# Patient Record
Sex: Female | Born: 1950 | Race: Black or African American | Hispanic: No | Marital: Single | Smoking: Never smoker
Health system: Southern US, Community
[De-identification: ages and names within clinical notes are randomized; demographics above are authoritative.]

## PROBLEM LIST (undated history)

## (undated) DIAGNOSIS — S63599A Other specified sprain of unspecified wrist, initial encounter: Secondary | ICD-10-CM

## (undated) DIAGNOSIS — M1990 Unspecified osteoarthritis, unspecified site: Secondary | ICD-10-CM

## (undated) DIAGNOSIS — Z87448 Personal history of other diseases of urinary system: Secondary | ICD-10-CM

## (undated) DIAGNOSIS — I10 Essential (primary) hypertension: Secondary | ICD-10-CM

## (undated) DIAGNOSIS — F32A Depression, unspecified: Secondary | ICD-10-CM

## (undated) DIAGNOSIS — R7303 Prediabetes: Secondary | ICD-10-CM

## (undated) DIAGNOSIS — Z862 Personal history of diseases of the blood and blood-forming organs and certain disorders involving the immune mechanism: Secondary | ICD-10-CM

## (undated) DIAGNOSIS — M5136 Other intervertebral disc degeneration, lumbar region: Secondary | ICD-10-CM

## (undated) DIAGNOSIS — G629 Polyneuropathy, unspecified: Secondary | ICD-10-CM

## (undated) DIAGNOSIS — M7541 Impingement syndrome of right shoulder: Secondary | ICD-10-CM

## (undated) DIAGNOSIS — M5430 Sciatica, unspecified side: Secondary | ICD-10-CM

## (undated) DIAGNOSIS — T8859XA Other complications of anesthesia, initial encounter: Secondary | ICD-10-CM

## (undated) DIAGNOSIS — T7840XA Allergy, unspecified, initial encounter: Secondary | ICD-10-CM

## (undated) DIAGNOSIS — K219 Gastro-esophageal reflux disease without esophagitis: Secondary | ICD-10-CM

## (undated) DIAGNOSIS — E119 Type 2 diabetes mellitus without complications: Secondary | ICD-10-CM

## (undated) DIAGNOSIS — Z87442 Personal history of urinary calculi: Secondary | ICD-10-CM

## (undated) DIAGNOSIS — M25831 Other specified joint disorders, right wrist: Secondary | ICD-10-CM

## (undated) DIAGNOSIS — E669 Obesity, unspecified: Secondary | ICD-10-CM

## (undated) DIAGNOSIS — Z8639 Personal history of other endocrine, nutritional and metabolic disease: Secondary | ICD-10-CM

## (undated) DIAGNOSIS — I890 Lymphedema, not elsewhere classified: Secondary | ICD-10-CM

## (undated) DIAGNOSIS — E785 Hyperlipidemia, unspecified: Secondary | ICD-10-CM

## (undated) DIAGNOSIS — H269 Unspecified cataract: Secondary | ICD-10-CM

## (undated) DIAGNOSIS — T4145XA Adverse effect of unspecified anesthetic, initial encounter: Secondary | ICD-10-CM

## (undated) DIAGNOSIS — Z98811 Dental restoration status: Secondary | ICD-10-CM

## (undated) DIAGNOSIS — Z972 Presence of dental prosthetic device (complete) (partial): Secondary | ICD-10-CM

## (undated) HISTORY — DX: Hyperlipidemia, unspecified: E78.5

## (undated) HISTORY — DX: Depression, unspecified: F32.A

## (undated) HISTORY — PX: PATELLA REALIGNMENT: SHX2179

## (undated) HISTORY — DX: Unspecified cataract: H26.9

## (undated) HISTORY — PX: DILATION AND CURETTAGE OF UTERUS: SHX78

## (undated) HISTORY — DX: Allergy, unspecified, initial encounter: T78.40XA

## (undated) HISTORY — PX: ELBOW ARTHROSCOPY: SHX614

## (undated) HISTORY — PX: TONSILLECTOMY: SUR1361

## (undated) HISTORY — PX: JOINT REPLACEMENT: SHX530

## (undated) HISTORY — PX: SHOULDER ARTHROSCOPY: SHX128

## (undated) HISTORY — PX: ROTATOR CUFF REPAIR: SHX139

## (undated) HISTORY — PX: EYE SURGERY: SHX253

## (undated) HISTORY — DX: Essential (primary) hypertension: I10

---
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
## 1989-05-11 DIAGNOSIS — Z862 Personal history of diseases of the blood and blood-forming organs and certain disorders involving the immune mechanism: Secondary | ICD-10-CM

## 1989-05-11 HISTORY — DX: Personal history of diseases of the blood and blood-forming organs and certain disorders involving the immune mechanism: Z86.2

## 1989-05-11 HISTORY — PX: ABDOMINAL HYSTERECTOMY: SHX81

## 1989-05-11 LAB — CONVERTED CEMR LAB

## 1997-09-06 ENCOUNTER — Ambulatory Visit (HOSPITAL_BASED_OUTPATIENT_CLINIC_OR_DEPARTMENT_OTHER): Admission: RE | Admit: 1997-09-06 | Discharge: 1997-09-06 | Payer: Self-pay | Admitting: Orthopedic Surgery

## 1998-03-14 ENCOUNTER — Emergency Department (HOSPITAL_COMMUNITY): Admission: EM | Admit: 1998-03-14 | Discharge: 1998-03-14 | Payer: Self-pay

## 1998-03-15 ENCOUNTER — Encounter: Payer: Self-pay | Admitting: Urology

## 1998-03-15 ENCOUNTER — Ambulatory Visit (HOSPITAL_COMMUNITY): Admission: RE | Admit: 1998-03-15 | Discharge: 1998-03-15 | Payer: Self-pay | Admitting: Urology

## 1998-04-09 ENCOUNTER — Encounter: Payer: Self-pay | Admitting: Urology

## 1998-04-09 ENCOUNTER — Ambulatory Visit (HOSPITAL_COMMUNITY): Admission: RE | Admit: 1998-04-09 | Discharge: 1998-04-09 | Payer: Self-pay | Admitting: Urology

## 1998-04-25 ENCOUNTER — Ambulatory Visit (HOSPITAL_COMMUNITY): Admission: RE | Admit: 1998-04-25 | Discharge: 1998-04-25 | Payer: Self-pay | Admitting: Urology

## 1998-04-25 ENCOUNTER — Encounter: Payer: Self-pay | Admitting: Urology

## 1998-05-06 ENCOUNTER — Ambulatory Visit (HOSPITAL_COMMUNITY): Admission: RE | Admit: 1998-05-06 | Discharge: 1998-05-06 | Payer: Self-pay | Admitting: Urology

## 1998-05-06 ENCOUNTER — Encounter: Payer: Self-pay | Admitting: Urology

## 1999-08-06 ENCOUNTER — Other Ambulatory Visit: Admission: RE | Admit: 1999-08-06 | Discharge: 1999-08-06 | Payer: Self-pay | Admitting: Physician Assistant

## 2000-05-11 DIAGNOSIS — Z87898 Personal history of other specified conditions: Secondary | ICD-10-CM

## 2000-05-11 DIAGNOSIS — Z87448 Personal history of other diseases of urinary system: Secondary | ICD-10-CM

## 2000-05-11 HISTORY — DX: Personal history of other specified conditions: Z87.898

## 2000-05-11 HISTORY — DX: Personal history of other diseases of urinary system: Z87.448

## 2000-06-16 ENCOUNTER — Ambulatory Visit (HOSPITAL_BASED_OUTPATIENT_CLINIC_OR_DEPARTMENT_OTHER): Admission: RE | Admit: 2000-06-16 | Discharge: 2000-06-16 | Payer: Self-pay | Admitting: *Deleted

## 2000-06-16 HISTORY — PX: WRIST ARTHROSCOPY: SHX838

## 2000-07-07 ENCOUNTER — Encounter: Admission: RE | Admit: 2000-07-07 | Discharge: 2000-07-07 | Payer: Self-pay | Admitting: Urology

## 2000-07-07 ENCOUNTER — Encounter: Payer: Self-pay | Admitting: Urology

## 2000-07-12 ENCOUNTER — Ambulatory Visit (HOSPITAL_COMMUNITY): Admission: RE | Admit: 2000-07-12 | Discharge: 2000-07-12 | Payer: Self-pay | Admitting: Urology

## 2000-09-10 ENCOUNTER — Other Ambulatory Visit: Admission: RE | Admit: 2000-09-10 | Discharge: 2000-09-10 | Payer: Self-pay | Admitting: Internal Medicine

## 2001-02-09 ENCOUNTER — Encounter: Payer: Self-pay | Admitting: Internal Medicine

## 2001-02-18 ENCOUNTER — Encounter: Payer: Self-pay | Admitting: Internal Medicine

## 2001-09-13 ENCOUNTER — Other Ambulatory Visit: Admission: RE | Admit: 2001-09-13 | Discharge: 2001-10-03 | Payer: Self-pay

## 2001-10-13 ENCOUNTER — Encounter: Payer: Self-pay | Admitting: Internal Medicine

## 2002-04-26 ENCOUNTER — Ambulatory Visit (HOSPITAL_BASED_OUTPATIENT_CLINIC_OR_DEPARTMENT_OTHER): Admission: RE | Admit: 2002-04-26 | Discharge: 2002-04-26 | Payer: Self-pay | Admitting: Orthopedic Surgery

## 2002-04-26 HISTORY — PX: SHOULDER ARTHROSCOPY W/ ROTATOR CUFF REPAIR: SHX2400

## 2002-08-09 ENCOUNTER — Ambulatory Visit (HOSPITAL_BASED_OUTPATIENT_CLINIC_OR_DEPARTMENT_OTHER): Admission: RE | Admit: 2002-08-09 | Discharge: 2002-08-09 | Payer: Self-pay | Admitting: *Deleted

## 2002-08-09 HISTORY — PX: CARPAL TUNNEL RELEASE: SHX101

## 2002-09-26 ENCOUNTER — Other Ambulatory Visit: Admission: RE | Admit: 2002-09-26 | Discharge: 2002-09-26 | Payer: Self-pay | Admitting: Internal Medicine

## 2002-09-28 ENCOUNTER — Encounter: Payer: Self-pay | Admitting: Internal Medicine

## 2003-08-28 ENCOUNTER — Encounter: Payer: Self-pay | Admitting: Internal Medicine

## 2003-09-10 ENCOUNTER — Inpatient Hospital Stay (HOSPITAL_COMMUNITY): Admission: RE | Admit: 2003-09-10 | Discharge: 2003-09-13 | Payer: Self-pay | Admitting: Orthopedic Surgery

## 2003-09-10 HISTORY — PX: TOTAL KNEE ARTHROPLASTY: SHX125

## 2004-08-20 ENCOUNTER — Encounter: Payer: Self-pay | Admitting: Internal Medicine

## 2004-09-12 ENCOUNTER — Ambulatory Visit: Payer: Self-pay | Admitting: Internal Medicine

## 2004-09-23 ENCOUNTER — Ambulatory Visit: Payer: Self-pay | Admitting: Internal Medicine

## 2004-11-10 ENCOUNTER — Inpatient Hospital Stay (HOSPITAL_COMMUNITY): Admission: RE | Admit: 2004-11-10 | Discharge: 2004-11-14 | Payer: Self-pay | Admitting: Orthopedic Surgery

## 2004-11-10 HISTORY — PX: TOTAL KNEE ARTHROPLASTY: SHX125

## 2005-09-24 ENCOUNTER — Ambulatory Visit: Payer: Self-pay | Admitting: Family Medicine

## 2005-10-07 ENCOUNTER — Ambulatory Visit: Payer: Self-pay | Admitting: Internal Medicine

## 2005-10-22 ENCOUNTER — Ambulatory Visit (HOSPITAL_COMMUNITY): Admission: RE | Admit: 2005-10-22 | Discharge: 2005-10-22 | Payer: Self-pay | Admitting: Internal Medicine

## 2005-10-23 ENCOUNTER — Encounter: Payer: Self-pay | Admitting: Internal Medicine

## 2005-10-29 ENCOUNTER — Ambulatory Visit: Payer: Self-pay | Admitting: Internal Medicine

## 2005-11-06 ENCOUNTER — Ambulatory Visit: Payer: Self-pay | Admitting: Internal Medicine

## 2006-02-01 ENCOUNTER — Emergency Department (HOSPITAL_COMMUNITY): Admission: EM | Admit: 2006-02-01 | Discharge: 2006-02-01 | Payer: Self-pay | Admitting: Emergency Medicine

## 2006-03-09 ENCOUNTER — Ambulatory Visit: Payer: Self-pay | Admitting: Internal Medicine

## 2006-03-09 DIAGNOSIS — I10 Essential (primary) hypertension: Secondary | ICD-10-CM | POA: Insufficient documentation

## 2006-03-09 DIAGNOSIS — E785 Hyperlipidemia, unspecified: Secondary | ICD-10-CM

## 2006-03-09 DIAGNOSIS — Z87442 Personal history of urinary calculi: Secondary | ICD-10-CM

## 2006-05-06 ENCOUNTER — Ambulatory Visit: Payer: Self-pay | Admitting: Internal Medicine

## 2006-06-07 ENCOUNTER — Ambulatory Visit: Payer: Self-pay | Admitting: Internal Medicine

## 2006-10-05 ENCOUNTER — Ambulatory Visit: Payer: Self-pay | Admitting: Internal Medicine

## 2006-10-05 LAB — CONVERTED CEMR LAB
ALT: 18 units/L (ref 0–40)
AST: 35 units/L (ref 0–37)
Albumin: 3.7 g/dL (ref 3.5–5.2)
Alkaline Phosphatase: 44 units/L (ref 39–117)
BUN: 15 mg/dL (ref 6–23)
Basophils Absolute: 0 10*3/uL (ref 0.0–0.1)
Basophils Relative: 0.4 % (ref 0.0–1.0)
Bilirubin, Direct: 0.1 mg/dL (ref 0.0–0.3)
CO2: 30 meq/L (ref 19–32)
Calcium: 9.9 mg/dL (ref 8.4–10.5)
Chloride: 107 meq/L (ref 96–112)
Cholesterol: 209 mg/dL (ref 0–200)
Creatinine, Ser: 0.8 mg/dL (ref 0.4–1.2)
Direct LDL: 109.7 mg/dL
Eosinophils Absolute: 0 10*3/uL (ref 0.0–0.6)
Eosinophils Relative: 1.2 % (ref 0.0–5.0)
GFR calc Af Amer: 95 mL/min
GFR calc non Af Amer: 79 mL/min
Glucose, Bld: 88 mg/dL (ref 70–99)
HCT: 37.3 % (ref 36.0–46.0)
HDL: 46.6 mg/dL (ref >39.0)
Hemoglobin: 13.2 g/dL (ref 12.0–15.0)
Lymphocytes Relative: 48.1 % — ABNORMAL HIGH (ref 12.0–46.0)
MCHC: 35.4 g/dL (ref 30.0–36.0)
MCV: 88.2 fL (ref 78.0–100.0)
Monocytes Absolute: 0.6 10*3/uL (ref 0.2–0.7)
Monocytes Relative: 14.9 % — ABNORMAL HIGH (ref 3.0–11.0)
Neutro Abs: 1.4 10*3/uL (ref 1.4–7.7)
Neutrophils Relative %: 35.4 % — ABNORMAL LOW (ref 43.0–77.0)
Platelets: 166 10*3/uL (ref 150–400)
Potassium: 3.7 meq/L (ref 3.5–5.1)
RBC: 4.24 M/uL (ref 3.87–5.11)
RDW: 12.3 % (ref 11.5–14.6)
Sodium: 141 meq/L (ref 135–145)
TSH: 0.95 microintl units/mL (ref 0.35–5.50)
Total Bilirubin: 0.7 mg/dL (ref 0.3–1.2)
Total CHOL/HDL Ratio: 4.5
Total Protein: 7 g/dL (ref 6.0–8.3)
Triglycerides: 186 mg/dL — ABNORMAL HIGH (ref 0–149)
VLDL: 37 mg/dL (ref 0–40)
WBC: 3.9 10*3/uL — ABNORMAL LOW (ref 4.5–10.5)

## 2006-10-12 ENCOUNTER — Ambulatory Visit: Payer: Self-pay | Admitting: Internal Medicine

## 2006-11-03 ENCOUNTER — Ambulatory Visit (HOSPITAL_BASED_OUTPATIENT_CLINIC_OR_DEPARTMENT_OTHER): Admission: RE | Admit: 2006-11-03 | Discharge: 2006-11-03 | Payer: Self-pay | Admitting: *Deleted

## 2006-11-03 HISTORY — PX: CARPAL TUNNEL RELEASE: SHX101

## 2007-03-09 ENCOUNTER — Ambulatory Visit: Payer: Self-pay | Admitting: Internal Medicine

## 2007-05-09 ENCOUNTER — Telehealth: Payer: Self-pay | Admitting: Internal Medicine

## 2007-05-17 ENCOUNTER — Telehealth: Payer: Self-pay | Admitting: Internal Medicine

## 2007-05-24 ENCOUNTER — Telehealth: Payer: Self-pay | Admitting: Internal Medicine

## 2007-05-27 ENCOUNTER — Telehealth: Payer: Self-pay | Admitting: Internal Medicine

## 2007-09-22 ENCOUNTER — Encounter: Payer: Self-pay | Admitting: Internal Medicine

## 2007-10-26 ENCOUNTER — Ambulatory Visit: Payer: Self-pay | Admitting: Internal Medicine

## 2007-10-26 LAB — CONVERTED CEMR LAB
ALT: 37 units/L — ABNORMAL HIGH (ref 0–35)
AST: 48 units/L — ABNORMAL HIGH (ref 0–37)
Albumin: 3.7 g/dL (ref 3.5–5.2)
Alkaline Phosphatase: 65 units/L (ref 39–117)
BUN: 13 mg/dL (ref 6–23)
Basophils Absolute: 0 10*3/uL (ref 0.0–0.1)
Basophils Relative: 0.3 % (ref 0.0–1.0)
Bilirubin Urine: NEGATIVE
Bilirubin, Direct: 0.1 mg/dL (ref 0.0–0.3)
CO2: 32 meq/L (ref 19–32)
Calcium: 10.6 mg/dL — ABNORMAL HIGH (ref 8.4–10.5)
Chloride: 104 meq/L (ref 96–112)
Cholesterol: 210 mg/dL (ref 0–200)
Creatinine, Ser: 0.9 mg/dL (ref 0.4–1.2)
Direct LDL: 137.6 mg/dL
Eosinophils Absolute: 0.1 10*3/uL (ref 0.0–0.7)
Eosinophils Relative: 1.7 % (ref 0.0–5.0)
GFR calc Af Amer: 83 mL/min
GFR calc non Af Amer: 68 mL/min
Glucose, Bld: 97 mg/dL (ref 70–99)
Glucose, Urine, Semiquant: NEGATIVE
HCT: 37.5 % (ref 36.0–46.0)
HDL: 41.1 mg/dL (ref >39.0)
Hemoglobin: 13.1 g/dL (ref 12.0–15.0)
Lymphocytes Relative: 51.9 % — ABNORMAL HIGH (ref 12.0–46.0)
MCHC: 35.1 g/dL (ref 30.0–36.0)
MCV: 86.7 fL (ref 78.0–100.0)
Monocytes Absolute: 0.6 10*3/uL (ref 0.1–1.0)
Monocytes Relative: 17.3 % — ABNORMAL HIGH (ref 3.0–12.0)
Neutro Abs: 1 10*3/uL — ABNORMAL LOW (ref 1.4–7.7)
Neutrophils Relative %: 28.8 % — ABNORMAL LOW (ref 43.0–77.0)
Nitrite: NEGATIVE
Platelets: 171 10*3/uL (ref 150–400)
Potassium: 4.4 meq/L (ref 3.5–5.1)
Protein, U semiquant: NEGATIVE
RBC: 4.32 M/uL (ref 3.87–5.11)
RDW: 12.9 % (ref 11.5–14.6)
Sodium: 140 meq/L (ref 135–145)
Specific Gravity, Urine: 1.015
TSH: 0.76 microintl units/mL (ref 0.35–5.50)
Total Bilirubin: 0.7 mg/dL (ref 0.3–1.2)
Total CHOL/HDL Ratio: 5.1
Total Protein: 7.1 g/dL (ref 6.0–8.3)
Triglycerides: 153 mg/dL — ABNORMAL HIGH (ref 0–149)
Urobilinogen, UA: 1
VLDL: 31 mg/dL (ref 0–40)
WBC: 3.4 10*3/uL — ABNORMAL LOW (ref 4.5–10.5)
pH: 5.5

## 2007-11-02 ENCOUNTER — Ambulatory Visit: Payer: Self-pay | Admitting: Internal Medicine

## 2007-11-02 DIAGNOSIS — K5730 Diverticulosis of large intestine without perforation or abscess without bleeding: Secondary | ICD-10-CM | POA: Insufficient documentation

## 2008-01-23 ENCOUNTER — Ambulatory Visit: Payer: Self-pay | Admitting: Internal Medicine

## 2008-07-23 ENCOUNTER — Ambulatory Visit: Payer: Self-pay | Admitting: Internal Medicine

## 2008-07-24 LAB — CONVERTED CEMR LAB
ALT: 17 units/L (ref 0–35)
Bilirubin, Direct: 0.1 mg/dL (ref 0.0–0.3)
CO2: 30 meq/L (ref 19–32)
Calcium: 9.9 mg/dL (ref 8.4–10.5)
Creatinine, Ser: 0.7 mg/dL (ref 0.4–1.2)
GFR calc Af Amer: 110 mL/min
GFR calc non Af Amer: 91 mL/min
HDL: 48.5 mg/dL (ref >39.0)
LDL Cholesterol: 102 mg/dL — ABNORMAL HIGH (ref 0–99)
Sodium: 142 meq/L (ref 135–145)
Total Bilirubin: 0.9 mg/dL (ref 0.3–1.2)
Total CHOL/HDL Ratio: 3.8
Triglycerides: 178 mg/dL — ABNORMAL HIGH (ref 0–149)

## 2008-07-25 ENCOUNTER — Ambulatory Visit: Payer: Self-pay

## 2008-07-25 ENCOUNTER — Encounter: Payer: Self-pay | Admitting: Internal Medicine

## 2008-07-27 ENCOUNTER — Encounter: Payer: Self-pay | Admitting: Internal Medicine

## 2008-08-02 ENCOUNTER — Encounter: Payer: Self-pay | Admitting: Internal Medicine

## 2008-08-06 ENCOUNTER — Ambulatory Visit (HOSPITAL_BASED_OUTPATIENT_CLINIC_OR_DEPARTMENT_OTHER): Admission: RE | Admit: 2008-08-06 | Discharge: 2008-08-06 | Payer: Self-pay | Admitting: *Deleted

## 2008-08-06 HISTORY — PX: TRIGGER FINGER RELEASE: SHX641

## 2008-08-09 ENCOUNTER — Encounter: Payer: Self-pay | Admitting: Internal Medicine

## 2008-09-26 ENCOUNTER — Encounter: Payer: Self-pay | Admitting: Internal Medicine

## 2008-10-02 ENCOUNTER — Encounter (INDEPENDENT_AMBULATORY_CARE_PROVIDER_SITE_OTHER): Payer: Self-pay | Admitting: *Deleted

## 2008-10-31 ENCOUNTER — Ambulatory Visit: Payer: Self-pay | Admitting: Internal Medicine

## 2008-11-01 LAB — CONVERTED CEMR LAB
ALT: 20 units/L (ref 0–35)
Alkaline Phosphatase: 58 units/L (ref 39–117)
BUN: 13 mg/dL (ref 6–23)
Basophils Relative: 0.1 % (ref 0.0–3.0)
Bilirubin, Direct: 0.2 mg/dL (ref 0.0–0.3)
CO2: 30 meq/L (ref 19–32)
Chloride: 104 meq/L (ref 96–112)
Eosinophils Absolute: 0.1 10*3/uL (ref 0.0–0.7)
Eosinophils Relative: 2.4 % (ref 0.0–5.0)
GFR calc non Af Amer: 109.94 mL/min (ref >60)
Glucose, Bld: 95 mg/dL (ref 70–99)
HDL: 48 mg/dL (ref >39.00)
LDL Cholesterol: 84 mg/dL (ref 0–99)
Lymphocytes Relative: 53.2 % — ABNORMAL HIGH (ref 12.0–46.0)
MCV: 89 fL (ref 78.0–100.0)
Monocytes Absolute: 0.5 10*3/uL (ref 0.1–1.0)
Neutrophils Relative %: 27.5 % — ABNORMAL LOW (ref 43.0–77.0)
Nitrite: NEGATIVE
Platelets: 149 10*3/uL — ABNORMAL LOW (ref 150.0–400.0)
Potassium: 4.2 meq/L (ref 3.5–5.1)
RBC: 4.19 M/uL (ref 3.87–5.11)
Sodium: 139 meq/L (ref 135–145)
Total Bilirubin: 0.9 mg/dL (ref 0.3–1.2)
Total Protein, Urine: NEGATIVE mg/dL
Urine Glucose: NEGATIVE mg/dL
VLDL: 27.8 mg/dL (ref 0.0–40.0)
WBC: 3 10*3/uL — ABNORMAL LOW (ref 4.5–10.5)
pH: 8 (ref 5.0–8.0)

## 2008-11-07 ENCOUNTER — Ambulatory Visit: Payer: Self-pay | Admitting: Internal Medicine

## 2008-11-07 DIAGNOSIS — D7289 Other specified disorders of white blood cells: Secondary | ICD-10-CM | POA: Insufficient documentation

## 2008-11-07 LAB — HM COLONOSCOPY

## 2008-11-07 LAB — CONVERTED CEMR LAB
Basophils Relative: 0.5 % (ref 0.0–3.0)
Eosinophils Relative: 1.6 % (ref 0.0–5.0)
HCT: 39.2 % (ref 36.0–46.0)
Hemoglobin: 13.7 g/dL (ref 12.0–15.0)
Lymphs Abs: 1.8 10*3/uL (ref 0.7–4.0)
MCV: 88.1 fL (ref 78.0–100.0)
Monocytes Relative: 13.3 % — ABNORMAL HIGH (ref 3.0–12.0)
Neutro Abs: 1.1 10*3/uL — ABNORMAL LOW (ref 1.4–7.7)
WBC: 3.5 10*3/uL — ABNORMAL LOW (ref 4.5–10.5)

## 2008-11-20 ENCOUNTER — Encounter: Payer: Self-pay | Admitting: Internal Medicine

## 2009-02-21 ENCOUNTER — Encounter: Payer: Self-pay | Admitting: Internal Medicine

## 2009-04-29 ENCOUNTER — Ambulatory Visit: Payer: Self-pay | Admitting: Internal Medicine

## 2009-04-29 DIAGNOSIS — G619 Inflammatory polyneuropathy, unspecified: Secondary | ICD-10-CM

## 2009-04-29 DIAGNOSIS — G622 Polyneuropathy due to other toxic agents: Secondary | ICD-10-CM

## 2009-05-07 LAB — CONVERTED CEMR LAB
ALT: 24 units/L (ref 0–35)
Eosinophils Absolute: 0.1 10*3/uL (ref 0.0–0.7)
Eosinophils Relative: 1.5 % (ref 0.0–5.0)
GFR calc non Af Amer: 94.08 mL/min (ref >60)
Glucose, Bld: 111 mg/dL — ABNORMAL HIGH (ref 70–99)
HCT: 40.9 % (ref 36.0–46.0)
Lymphs Abs: 2 10*3/uL (ref 0.7–4.0)
MCHC: 34.4 g/dL (ref 30.0–36.0)
MCV: 93.9 fL (ref 78.0–100.0)
Monocytes Absolute: 0.5 10*3/uL (ref 0.1–1.0)
Platelets: 173 10*3/uL (ref 150.0–400.0)
Potassium: 4.1 meq/L (ref 3.5–5.1)
RDW: 12.7 % (ref 11.5–14.6)
Sodium: 137 meq/L (ref 135–145)
Total Bilirubin: 0.7 mg/dL (ref 0.3–1.2)

## 2009-06-17 ENCOUNTER — Ambulatory Visit: Payer: Self-pay | Admitting: Internal Medicine

## 2009-06-17 LAB — CONVERTED CEMR LAB
CO2: 30 meq/L (ref 19–32)
Chloride: 105 meq/L (ref 96–112)
Creatinine, Ser: 0.7 mg/dL (ref 0.4–1.2)
Sodium: 138 meq/L (ref 135–145)

## 2009-06-19 LAB — CONVERTED CEMR LAB: Calcium, Total (PTH): 9.8 mg/dL (ref 8.4–10.5)

## 2009-06-24 ENCOUNTER — Encounter: Payer: Self-pay | Admitting: Internal Medicine

## 2009-10-01 ENCOUNTER — Encounter: Payer: Self-pay | Admitting: Internal Medicine

## 2009-11-06 ENCOUNTER — Ambulatory Visit: Payer: Self-pay | Admitting: Internal Medicine

## 2009-11-06 LAB — CONVERTED CEMR LAB
AST: 40 units/L — ABNORMAL HIGH (ref 0–37)
Alkaline Phosphatase: 58 units/L (ref 39–117)
BUN: 13 mg/dL (ref 6–23)
Basophils Absolute: 0 10*3/uL (ref 0.0–0.1)
Blood in Urine, dipstick: NEGATIVE
Calcium: 10.3 mg/dL (ref 8.4–10.5)
Cholesterol: 175 mg/dL (ref 0–200)
Eosinophils Absolute: 0.1 10*3/uL (ref 0.0–0.7)
GFR calc non Af Amer: 106.06 mL/min (ref >60)
Glucose, Bld: 81 mg/dL (ref 70–99)
Glucose, Urine, Semiquant: NEGATIVE
HDL: 50.1 mg/dL (ref >39.00)
Ketones, urine, test strip: NEGATIVE
Lymphocytes Relative: 52.2 % — ABNORMAL HIGH (ref 12.0–46.0)
Lymphs Abs: 2 10*3/uL (ref 0.7–4.0)
Monocytes Relative: 13.3 % — ABNORMAL HIGH (ref 3.0–12.0)
Platelets: 162 10*3/uL (ref 150.0–400.0)
RDW: 13.4 % (ref 11.5–14.6)
Specific Gravity, Urine: 1.015
TSH: 1.15 microintl units/mL (ref 0.35–5.50)
Total Bilirubin: 0.6 mg/dL (ref 0.3–1.2)
Triglycerides: 261 mg/dL — ABNORMAL HIGH (ref 0.0–149.0)
pH: 7

## 2009-11-13 ENCOUNTER — Ambulatory Visit: Payer: Self-pay | Admitting: Internal Medicine

## 2009-12-19 ENCOUNTER — Encounter: Payer: Self-pay | Admitting: Internal Medicine

## 2010-05-11 DIAGNOSIS — M5136 Other intervertebral disc degeneration, lumbar region: Secondary | ICD-10-CM

## 2010-05-11 DIAGNOSIS — M51369 Other intervertebral disc degeneration, lumbar region without mention of lumbar back pain or lower extremity pain: Secondary | ICD-10-CM

## 2010-05-11 HISTORY — DX: Other intervertebral disc degeneration, lumbar region: M51.36

## 2010-05-11 HISTORY — DX: Other intervertebral disc degeneration, lumbar region without mention of lumbar back pain or lower extremity pain: M51.369

## 2010-05-14 ENCOUNTER — Ambulatory Visit
Admission: RE | Admit: 2010-05-14 | Discharge: 2010-05-14 | Payer: Self-pay | Source: Home / Self Care | Attending: Internal Medicine | Admitting: Internal Medicine

## 2010-05-14 ENCOUNTER — Other Ambulatory Visit: Payer: Self-pay | Admitting: Internal Medicine

## 2010-05-14 DIAGNOSIS — M549 Dorsalgia, unspecified: Secondary | ICD-10-CM | POA: Insufficient documentation

## 2010-05-14 LAB — CBC WITH DIFFERENTIAL/PLATELET
Basophils Absolute: 0 10*3/uL (ref 0.0–0.1)
Basophils Relative: 0.4 % (ref 0.0–3.0)
Eosinophils Absolute: 0.1 10*3/uL (ref 0.0–0.7)
Eosinophils Relative: 2.6 % (ref 0.0–5.0)
HCT: 38.3 % (ref 36.0–46.0)
Hemoglobin: 13.3 g/dL (ref 12.0–15.0)
Lymphocytes Relative: 51.6 % — ABNORMAL HIGH (ref 12.0–46.0)
Lymphs Abs: 2.2 10*3/uL (ref 0.7–4.0)
MCHC: 34.6 g/dL (ref 30.0–36.0)
MCV: 91 fl (ref 78.0–100.0)
Monocytes Absolute: 0.5 10*3/uL (ref 0.1–1.0)
Monocytes Relative: 12.9 % — ABNORMAL HIGH (ref 3.0–12.0)
Neutro Abs: 1.4 10*3/uL (ref 1.4–7.7)
Neutrophils Relative %: 32.5 % — ABNORMAL LOW (ref 43.0–77.0)
Platelets: 167 10*3/uL (ref 150.0–400.0)
RBC: 4.21 Mil/uL (ref 3.87–5.11)
RDW: 13.7 % (ref 11.5–14.6)
WBC: 4.2 10*3/uL — ABNORMAL LOW (ref 4.5–10.5)

## 2010-05-14 LAB — TSH: TSH: 0.78 u[IU]/mL (ref 0.35–5.50)

## 2010-05-14 LAB — BASIC METABOLIC PANEL
BUN: 10 mg/dL (ref 6–23)
CO2: 28 mEq/L (ref 19–32)
Calcium: 10.4 mg/dL (ref 8.4–10.5)
Chloride: 103 mEq/L (ref 96–112)
Creatinine, Ser: 0.7 mg/dL (ref 0.4–1.2)
GFR: 109.37 mL/min (ref >60.00)
Glucose, Bld: 95 mg/dL (ref 70–99)
Potassium: 4.4 mEq/L (ref 3.5–5.1)
Sodium: 137 mEq/L (ref 135–145)

## 2010-05-14 LAB — SEDIMENTATION RATE: Sed Rate: 9 mm/hr (ref 0–22)

## 2010-05-14 LAB — LIPID PANEL
Cholesterol: 176 mg/dL (ref 0–200)
HDL: 46.1 mg/dL (ref >39.00)
LDL Cholesterol: 103 mg/dL — ABNORMAL HIGH (ref 0–99)
Total CHOL/HDL Ratio: 4
Triglycerides: 135 mg/dL (ref 0.0–149.0)
VLDL: 27 mg/dL (ref 0.0–40.0)

## 2010-05-28 ENCOUNTER — Encounter
Admission: RE | Admit: 2010-05-28 | Discharge: 2010-06-10 | Payer: Self-pay | Source: Home / Self Care | Attending: Internal Medicine | Admitting: Internal Medicine

## 2010-06-04 ENCOUNTER — Encounter: Payer: Self-pay | Admitting: Internal Medicine

## 2010-06-10 NOTE — Assessment & Plan Note (Signed)
Summary: CPX // RS   Vital Signs:  Patient profile:   60 year old female Menstrual status:  postmenopausal Height:      64.5 inches Weight:      219 pounds BMI:     37.14 Pulse rate:   80 / minute Pulse rhythm:   regular Resp:     12 per minute BP sitting:   104 / 78  (left arm) Cuff size:   regular  Vitals Entered By: Gladis Riffle, RN (November 13, 2009 9:25 AM)  Nutrition Counseling: Patient's BMI is greater than 25 and therefore counseled on weight management options. CC: cpx, labs done Is Patient Diabetic? No   CC:  cpx and labs done.  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 1/2  By Mouth Daily 2)  Premarin 0.625 Mg Tabs (Estrogens Conjugated) .... One By Mouth Daily 3)  Restasis 0.05 % Emul (Cyclosporine) .... Bid 4)  Gabapentin 300 Mg Caps (Gabapentin) .... Take 1 Tablet By Mouth Three Times A Day 5)  Nortriptyline Hcl 10 Mg Caps (Nortriptyline Hcl) .... Two At Bedtime 6)  Tramadol Hcl 50 Mg Tabs (Tramadol Hcl) .... One To Two Every Six Hours As Needed 7)  Multivitamins  Tabs (Multiple Vitamin) .... Once Daily 8)  Ibuprofen 200 Mg Tabs (Ibuprofen) .... As Needed  Allergies: 1)  ! Erythromycin  Past History:  Past Medical History: Last updated: 11/02/2007 Hyperlipidemia Nephrolithiasis, hx of osteoarthritis Hypertension Diverticulosis, colon elevated lfts--hep B and C negative 2006  Past Surgical History: Last updated: 11/07/2008 Carpal tunnel release Hysterectomy Total knee  replacement-bilatera rotator cuff trigger thumb kidney stone extraction right trigger finger (2010)  Family History: Last updated: 11/07/2008 mother-strokes/brain surgery (radiation) father deceased---unknown son deceased---liver transplant (adopted son)  Social History: Last updated: 11/13/2009 Alcohol use-no Regular exercise-no Never Smoked retired DEC 2010  Risk Factors: Exercise: no (11/02/2007)  Risk Factors: Smoking Status: never (11/13/2009)  Social History: Alcohol use-no Regular exercise-no Never Smoked retired DEC 2010  Physical Exam  General:  Well-developed,well-nourished,in no acute distress; alert,appropriate and cooperative throughout examination Head:  normocephalic and atraumatic.   Eyes:  pupils equal and pupils round.   Ears:  R ear normal and L ear normal.   Nose:  External nasal examination shows no deformity or inflammation. Nasal mucosa are pink and moist without lesions or exudates. Neck:  No deformities, masses, or tenderness noted. Chest Wall:  No deformities, masses, or tenderness noted. Lungs:  Normal respiratory effort, chest expands symmetrically. Lungs are clear to auscultation, no crackles or wheezes. Heart:  Normal rate and regular rhythm. S1 and S2 normal without gallop, murmur, click, rub or other extra sounds. Abdomen:  Bowel sounds positive,abdomen soft and non-tender without masses, organomegaly or hernias noted.  overweght Msk:  normal ROM and no joint tenderness.   Pulses:  R radial normal and L radial normal.   Neurologic:  alert & oriented X3 and cranial nerves II-XII intact.   Skin:  turgor normal and color normal.   Psych:  normally interactive and good eye contact.     Impression & Recommendations:  Problem #  1:  PHYSICAL EXAMINATION (ICD-V70.0) health maint UTD advised daily exercise  need for aggressive weight loss  Problem # 2:  HYPERTENSION (ICD-401.9)  controlled continue current medications  Her updated  medication list for this problem includes:    Lisinopril-hydrochlorothiazide 20-25 Mg Tabs (Lisinopril-hydrochlorothiazide) .Marland Kitchen... 1/2  by mouth daily  BP today: 104/78 Prior BP: 118/92 (04/29/2009)  Labs Reviewed: K+: 4.2 (11/06/2009) Creat: : 0.7 (11/06/2009)   Chol: 175 (11/06/2009)   HDL: 50.10 (11/06/2009)   LDL: 84 (10/31/2008)   TG: 261.0 (11/06/2009)  Problem # 3:  HYPERLIPIDEMIA (ICD-272.4) well controlled and on no meds Labs Reviewed: SGOT: 40 (11/06/2009)   SGPT: 21 (11/06/2009)   HDL:50.10 (11/06/2009), 48.00 (10/31/2008)  LDL:84 (10/31/2008), 102 (57/84/6962)  Chol:175 (11/06/2009), 160 (10/31/2008)  Trig:261.0 (11/06/2009), 139.0 (10/31/2008)  Complete Medication List: 1)  Lisinopril-hydrochlorothiazide 20-25 Mg Tabs (Lisinopril-hydrochlorothiazide) .... 1/2  by mouth daily 2)  Premarin 0.45 Mg Tabs (Estrogens conjugated) .... Take 1 tablet by mouth once a day 3)  Restasis 0.05 % Emul (Cyclosporine) .... Bid 4)  Gabapentin 300 Mg Caps (Gabapentin) .... Take 1 tablet by mouth three times a day 5)  Nortriptyline Hcl 10 Mg Caps (Nortriptyline hcl) .... Two at bedtime 6)  Tramadol Hcl 50 Mg Tabs (Tramadol hcl) .... One to two every six hours as needed 7)  Multivitamins Tabs (Multiple vitamin) .... Once daily 8)  Ibuprofen 200 Mg Tabs (Ibuprofen) .... As needed  Preventive Care Screening  Mammogram:    Date:  10/01/2009    Next Due:  10/2010    Results:  normal    Contraindications/Deferment of Procedures/Staging:    Test/Procedure: PAP Smear    Reason for deferment: hysterectomy   Patient Instructions: 1)  Please schedule a follow-up appointment in 6 months. Prescriptions: PREMARIN 0.45 MG TABS (ESTROGENS CONJUGATED) Take 1 tablet by mouth once a day  #90 x 3   Entered and Authorized by:   Birdie Sons MD   Signed by:   Birdie Sons MD on 11/13/2009   Method used:   Faxed to ...       [facility] (mail-order)             , [place]         Ph: [phone]       Fax:  [phone]   RxID:   [id number]    Preventive Care Screening  Mammogram:    Date:  10/01/2009    Next Due:  10/2010    Results:  normal

## 2010-06-10 NOTE — Letter (Signed)
Summary: [facility]  [facility]   Imported By: Maryln Gottron 01/08/2010 15:52:30  _____________________________________________________________________  External Attachment:    Type:   Image     Comment:   External Document

## 2010-06-10 NOTE — Letter (Signed)
Summary: [facility]  [facility]   Imported By: Maryln Gottron 07/01/2009 14:19:57  _____________________________________________________________________  External Attachment:    Type:   Image     Comment:   External Document

## 2010-06-12 ENCOUNTER — Ambulatory Visit: Payer: 59

## 2010-06-12 NOTE — Assessment & Plan Note (Signed)
Summary: 6 month rov/njr   Vital Signs:  Patient profile:   60 year old female Menstrual status:  postmenopausal Weight:      226 pounds Temp:     98.5 degrees F oral Pulse rate:   102 / minute Pulse rhythm:   regular BP sitting:   112 / 80  (left arm) Cuff size:   large  Vitals Entered By: Alfred Levins, CMA (May 14, 2010 8:32 AM) CC: bp check, back pain x1 mth   CC:  bp check and back pain x1 mth.  History of Present Illness:  Follow-Up Visit      This is a 60 year old woman who presents for Follow-up visit.  The patient complains of palpitations, but denies chest pain.  Since the last visit the patient notes no new problems or concerns but has wosening peripheral neuropathy---started lyrica (prescirbed by neurology). Also has had significant right sided lower back and flank pain for at least one month. no radiation of pain.  The patient reports taking meds as prescribed.  When questioned about possible medication side effects, the patient notes none.    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 1/2  By Mouth Daily 2)  Premarin 0.45 Mg Tabs (Estrogens Conjugated) .... Take 1 Tablet By Mouth Once A Day 3)  Restasis 0.05 % Emul (Cyclosporine) .... Bid 4)  Gabapentin 300 Mg Caps (Gabapentin) .... Take 1 Tablet By Mouth Three Times A Day 5)  Nortriptyline Hcl 10 Mg Caps (Nortriptyline Hcl) .... Two At Bedtime 6)  Tramadol Hcl 50 Mg Tabs (Tramadol Hcl) .... One To Two Every Six Hours As Needed 7)  Multivitamins  Tabs  (Multiple Vitamin) .... Once Daily 8)  Ibuprofen 200 Mg Tabs (Ibuprofen) .... As Needed 9)  Vitamin B-12 1000 Mcg Tabs (Cyanocobalamin) .Marland Kitchen.. 1 By Mouth Once Daily 10)  Vitamin B-6 100 Mg Tabs (Pyridoxine Hcl) .Marland Kitchen.. 1 By Mouth Once Daily 11)  Lyrica 50 Mg Caps (Pregabalin) .Marland Kitchen.. 1 By Mouth Two Times A Day  Allergies (verified): 1)  ! Erythromycin  Past History:  Past Medical History: Last updated: 11/02/2007 Hyperlipidemia Nephrolithiasis, hx of osteoarthritis Hypertension Diverticulosis, colon elevated lfts--hep B and C negative 2006  Past Surgical History: Last updated: 11/07/2008 Carpal tunnel release Hysterectomy Total knee replacement-bilatera rotator cuff trigger thumb kidney stone extraction right trigger finger (2010)  Family History: Last updated: 11/07/2008 mother-strokes/brain surgery (radiation) father deceased---unknown son deceased---liver transplant (adopted son)  Social History: Last updated: 11/13/2009 Alcohol use-no Regular exercise-no Never Smoked retired DEC 2010  Risk Factors: Exercise: no (11/02/2007)  Risk Factors: Smoking Status: never (11/13/2009)  Physical Exam  General:   obese female in no acute distress HEENT exam atraumatic, normocephalic symmetric her muscles are intact. Neck is supple. Chest good auscultation cardiac exam S1-S2 are regular. Abdominal exam obese, good bowel sounds. Extremities no clubbing cyanosis or edema. No back pain to palpation of the LS-spine. Deep tendon reflexes are diminished bilaterally at the knees. Deep tendon reflexes are absent bilaterally at the ankles. She has full range of motion of both hips. Strength is normal in the lower  extremities. Gait is normal.   Impression & Recommendations:  Problem # 1:  HYPERTENSION (ICD-401.9)  tolerating meds without difficulty Her updated medication list for this problem includes:    Lisinopril-hydrochlorothiazide 20-25 Mg Tabs (Lisinopril-hydrochlorothiazide)  .Marland Kitchen... 1/2  by mouth daily  BP today: 112/80 Prior BP: 104/78 (11/13/2009)  Labs Reviewed: K+: 4.2 (11/06/2009) Creat: : 0.7 (11/06/2009)   Chol: 175 (11/06/2009)   HDL: 50.10 (11/06/2009)   LDL: 84 (10/31/2008)   TG: 261.0 (11/06/2009)  Orders: Venipuncture (52841) TLB-BMP (Basic Metabolic Panel-BMET) (80048-METABOL)  Problem # 2:  NEPHROLITHIASIS, HX OF (ICD-V13.01) no recurrence  Problem # 3:  HYPERLIPIDEMIA (ICD-272.4)  no meds (this really has been a TG problem) Labs Reviewed: SGOT: 40 (11/06/2009)   SGPT: 21 (11/06/2009)   HDL:50.10 (11/06/2009), 48.00 (10/31/2008)  LDL:84 (10/31/2008), 102 (32/44/0102)  Chol:175 (11/06/2009), 160 (10/31/2008)  Trig:261.0 (11/06/2009), 139.0 (10/31/2008)  Orders: TLB-Lipid Panel (80061-LIPID) TLB-TSH (Thyroid Stimulating Hormone) (84443-TSH)  Problem # 4:  NEUROPATHY, NOS (ICD-357.9) note combination of neurontin and lyrica---she states she is supposed to be taking both---she will confirm with neurology  Problem # 5:  BACK PAIN (ICD-724.5)  ? cuase suspect mechanical LBP advised exercise and PT but will check xray first  Back pain is now constant---worse late evening and early a.m. Her updated medication list for this problem includes:    Tramadol Hcl 50 Mg Tabs (Tramadol hcl) ..... One to two every six hours as needed    Ibuprofen 200 Mg Tabs (Ibuprofen) .Marland Kitchen... As needed  Orders: T-Lumbar Spine 2 Views (72100TC) TLB-Sedimentation Rate (ESR) (85652-ESR)  Complete Medication List: 1)  Lisinopril-hydrochlorothiazide 20-25 Mg Tabs (Lisinopril-hydrochlorothiazide) .... 1/2  by mouth daily 2)  Premarin 0.45 Mg Tabs (Estrogens conjugated) .... Take 1 tablet by mouth once a day 3)  Restasis 0.05 % Emul (Cyclosporine) .... Bid 4)  Gabapentin 300 Mg Caps (Gabapentin) .... Take 1 tablet by mouth three times a day 5)  Nortriptyline Hcl 10 Mg Caps (Nortriptyline hcl) .... Two at bedtime 6)  Tramadol Hcl 50 Mg Tabs (Tramadol hcl) .... One  to two every six hours as needed 7)  Multivitamins Tabs (Multiple vitamin) .... Once daily 8)  Ibuprofen 200 Mg Tabs (Ibuprofen) .... As needed 9)  Vitamin B-12 1000 Mcg Tabs (Cyanocobalamin) .Marland Kitchen.. 1 by mouth once daily 10)  Vitamin B-6 100 Mg Tabs (Pyridoxine hcl) .Marland Kitchen.. 1 by mouth once daily 11)  Lyrica 50 Mg Caps (Pregabalin) .Marland Kitchen.. 1 by mouth two times a day  Other Orders: TLB-CBC Platelet - w/Differential (85025-CBCD)   Orders Added: 1)  T-Lumbar Spine 2 Views [72100TC] 2)  Est. Patient Level IV [72536] 3)  Venipuncture [36415] 4)  TLB-BMP (Basic Metabolic Panel-BMET) [80048-METABOL] 5)  TLB-CBC Platelet - w/Differential [85025-CBCD] 6)  TLB-Lipid Panel [80061-LIPID] 7)  TLB-TSH (Thyroid Stimulating Hormone) [84443-TSH] 8)  TLB-Sedimentation Rate (ESR) [85652-ESR]  Appended Document: Orders Update     Clinical Lists Changes  Orders: Added new Service order of Specimen Handling (64403) - Signed

## 2010-06-18 NOTE — Miscellaneous (Signed)
Summary: Initial Summary for PT Services/[facility]  Initial Summary for PT Services/[facility]   Imported By: Maryln Gottron 06/10/2010 14:24:04  _____________________________________________________________________  External Attachment:    Type:   Image     Comment:   External Document

## 2010-06-25 ENCOUNTER — Ambulatory Visit: Payer: 59 | Attending: Internal Medicine

## 2010-06-25 DIAGNOSIS — M545 Low back pain, unspecified: Secondary | ICD-10-CM | POA: Insufficient documentation

## 2010-06-25 DIAGNOSIS — R293 Abnormal posture: Secondary | ICD-10-CM | POA: Insufficient documentation

## 2010-06-25 DIAGNOSIS — IMO0001 Reserved for inherently not codable concepts without codable children: Secondary | ICD-10-CM | POA: Insufficient documentation

## 2010-06-27 ENCOUNTER — Ambulatory Visit: Payer: 59 | Admitting: Physical Therapy

## 2010-07-01 ENCOUNTER — Ambulatory Visit: Payer: 59

## 2010-07-03 ENCOUNTER — Ambulatory Visit: Payer: 59

## 2010-07-08 ENCOUNTER — Ambulatory Visit: Payer: 59 | Admitting: Physical Therapy

## 2010-07-09 ENCOUNTER — Ambulatory Visit: Payer: 59 | Admitting: Physical Therapy

## 2010-07-10 ENCOUNTER — Ambulatory Visit: Payer: 59 | Attending: Internal Medicine

## 2010-07-10 DIAGNOSIS — IMO0001 Reserved for inherently not codable concepts without codable children: Secondary | ICD-10-CM | POA: Insufficient documentation

## 2010-07-10 DIAGNOSIS — M545 Low back pain, unspecified: Secondary | ICD-10-CM | POA: Insufficient documentation

## 2010-07-10 DIAGNOSIS — R293 Abnormal posture: Secondary | ICD-10-CM | POA: Insufficient documentation

## 2010-08-21 LAB — BASIC METABOLIC PANEL
BUN: 10 mg/dL (ref 6–23)
Chloride: 102 mEq/L (ref 96–112)
Creatinine, Ser: 0.68 mg/dL (ref 0.4–1.2)
GFR calc non Af Amer: >60 mL/min (ref >60)
Glucose, Bld: 91 mg/dL (ref 70–99)
Potassium: 4.1 mEq/L (ref 3.5–5.1)

## 2010-08-26 ENCOUNTER — Other Ambulatory Visit: Payer: Self-pay | Admitting: *Deleted

## 2010-08-26 NOTE — Telephone Encounter (Signed)
Wants "generic" Premarin sent to [facility], please.

## 2010-08-26 NOTE — Telephone Encounter (Signed)
Pt called back and prefers Katherine [name] [facility])

## 2010-08-27 MED ORDER — ESTROGENS CONJUGATED 0.625 MG PO TABS: 0.6250 mg | ORAL_TABLET | Freq: Every day | ORAL | Status: DC

## 2010-08-27 NOTE — Telephone Encounter (Signed)
Ok- i'm not sure there is such a thing

## 2010-09-08 ENCOUNTER — Ambulatory Visit (INDEPENDENT_AMBULATORY_CARE_PROVIDER_SITE_OTHER): Payer: 59 | Admitting: Internal Medicine

## 2010-09-08 ENCOUNTER — Encounter: Payer: Self-pay | Admitting: Internal Medicine

## 2010-09-08 DIAGNOSIS — E785 Hyperlipidemia, unspecified: Secondary | ICD-10-CM

## 2010-09-08 DIAGNOSIS — I10 Essential (primary) hypertension: Secondary | ICD-10-CM

## 2010-09-08 DIAGNOSIS — G622 Polyneuropathy due to other toxic agents: Secondary | ICD-10-CM

## 2010-09-08 DIAGNOSIS — G619 Inflammatory polyneuropathy, unspecified: Secondary | ICD-10-CM

## 2010-09-08 MED ORDER — LISINOPRIL 10 MG PO TABS: 10.0000 mg | ORAL_TABLET | Freq: Every day | ORAL | Status: DC

## 2010-09-08 MED ORDER — HYDROCHLOROTHIAZIDE 25 MG PO TABS: 25.0000 mg | ORAL_TABLET | Freq: Every day | ORAL | Status: DC

## 2010-09-08 NOTE — Progress Notes (Signed)
  Subjective:    Patient ID: Katherine [name], female    DOB: [birth year], 60 y.o.   MRN: [id number]  HPI  Peripheral neuropathy---being seen by neurology: carol martin  htn--tolerating meds  She describes swelling of hands, feet and knees. She thinks related to medications  Chronic pain  Past Medical History  Diagnosis Date  . Hyperlipidemia   . Chronic kidney disease     kidney stones  . Arthritis   . Hypertension   . Diverticulosis of colon   . Elevated LFTs     neg for hep b and c in 2006   Past Surgical History  Procedure Date  . Carpal tunnel release   . Abdominal hysterectomy   . Replacement total knee bilateral   . Rotator cuff repair   . Kidney stone surgery   . Trigger finger release 2010    right    reports that she has never smoked. She does not have any smokeless tobacco history on file. She reports that she does not drink alcohol. Her drug history not on file. family history includes Stroke in her mother. Allergies  Allergen Reactions  . Erythromycin     REACTION: chest pain     Review of Systems     Objective:   Physical Exam        Assessment & Plan:

## 2010-09-15 ENCOUNTER — Encounter: Payer: Self-pay | Admitting: Internal Medicine

## 2010-09-15 NOTE — Assessment & Plan Note (Signed)
Reviewed labs No need for meds

## 2010-09-15 NOTE — Assessment & Plan Note (Signed)
Controlled---maybe overtreated See new medications Side effects discussed

## 2010-09-15 NOTE — Assessment & Plan Note (Signed)
This continues to be a significant concern of hers She has f/u with neurology No other suggestions at this time

## 2010-09-23 NOTE — Op Note (Signed)
NAME:  Katherine [name], Katherine [name] NO.:  [id number]   MEDICAL RECORD NO.:  [id number]          PATIENT TYPE:  AMB   LOCATION:  DSC                          FACILITY:  [facility]   PHYSICIAN:  Tennis Must Meyerdierks, M.D.DATE OF BIRTH:  May 23, 1950   DATE OF PROCEDURE:  DATE OF DISCHARGE:                               OPERATIVE REPORT   PREOPERATIVE DIAGNOSIS:  Right ring trigger finger.   POSTOPERATIVE DIAGNOSIS:  Right ring trigger finger.   PROCEDURE:  Release of A1 pulley right ring finger.   SURGEON:  Lowell Bouton, MD   ANESTHESIA:  A 0.5% Marcaine local with sedation.   OPERATIVE FINDINGS:  The patient had a small a ganglion cyst along the  A1 pulley.  The flexor sheath was intact and the flexor tendon had  thickening around the tenosynovium.   PROCEDURE:  Under 0.5% Marcaine local anesthesia with a tourniquet on  the right arm, the right hand was prepped and draped in usual fashion  after exsanguinating the limb, the tourniquet was inflated 250 mmHg.  A  V-shaped incision was made in the palm overlying the ring finger MP  joint and carried down through the subcutaneous tissues.  Blunt  dissection was carried down through the subcutaneous tissues to the  flexor sheath.  A small ganglion cyst was identified and was excised  along with the A1 pulley.  The finger was then flexed and there was no  further triggering.  The flexor tendon was examined and was found to  have thickening around the lining of the tendon.  The wound was then  copiously irrigated.  The skin was closed with 4-0 nylon sutures.  Sterile dressings were applied.  The patient had the tourniquet  released.  She went to recovery room awake and stable in good condition.      Lowell Bouton, M.D.  Electronically Signed     EMM/MEDQ  D:  08/06/2008  T:  08/07/2008  Job:  [id number]

## 2010-09-23 NOTE — Op Note (Signed)
NAME:  Katherine [name], COCKER NO.:  [id number]   MEDICAL RECORD NO.:  [id number]          PATIENT TYPE:  AMB   LOCATION:  DSC                          FACILITY:  [facility]   PHYSICIAN:  Tennis Must Meyerdierks, M.D.DATE OF BIRTH:  July 28, 1950   DATE OF PROCEDURE:  11/03/2006  DATE OF DISCHARGE:                               OPERATIVE REPORT   PREOPERATIVE DIAGNOSIS:  Left carpal tunnel syndrome.   POSTOPERATIVE DIAGNOSIS:  Left carpal tunnel syndrome.   PROCEDURE:  Decompression median nerve left carpal tunnel.   SURGEON:  Lowell Bouton, M.D.   ANESTHESIA:  0.5% Marcaine local with sedation.   OPERATIVE FINDINGS:  The patient had no masses in the carpal canal.  The  motor branch of the nerve was intact.   DESCRIPTION OF PROCEDURE:  Under 0.5% Marcaine local anesthesia with a  tourniquet on the left arm, the left hand was prepped and draped in the  usual fashion. After exsanguinating the limb, the tourniquet was  inflated to 250 mmHg.  A 3 cm longitudinal incision was made in the palm  just ulnar to the thenar crease.  Sharp dissection was carried through  the subcutaneous tissues.  Blunt dissection was carried through the  superficial palmar fascia distal to the transverse carpal ligament.  A  hemostat was then placed in the carpal canal up against the hook of the  hamate and the transverse carpal ligament was divided on the ulnar  border of the median nerve.  The proximal end of the ligament was  divided with scissors after dissecting the nerve away from the under  surface of the ligament.  The carpal canal was then palpated and was  found to be adequately decompressed. The nerve was examined and the  motor branch was identified.  The wound was then irrigated with saline.  The skin was closed with 4-0 nylon suture.  Sterile dressings were  applied followed by a volar wrist splint.  The patient tolerated the  procedure well and went to the recovery room  awake in stable and good  condition.      Lowell Bouton, M.D.  Electronically Signed     EMM/MEDQ  D:  11/03/2006  T:  11/03/2006  Job:  [id number]

## 2010-09-26 NOTE — Op Note (Signed)
   NAME:  Katherine [name], DIPRIMA NO.:  [id number]   MEDICAL RECORD NO.:  [id number]                   PATIENT TYPE:  AMB   LOCATION:  DSC                                  FACILITY:  [facility]   PHYSICIAN:  Lowell Bouton, M.D.      DATE OF BIRTH:  November 10, 1950   DATE OF PROCEDURE:  08/09/2002  DATE OF DISCHARGE:                                 OPERATIVE REPORT   PREOPERATIVE DIAGNOSIS:  Right carpal tunnel syndrome.   POSTOPERATIVE DIAGNOSIS:  Right carpal tunnel syndrome.   OPERATION PERFORMED:  Decompression of median nerve, right carpal tunnel.   SURGEON:  Lowell Bouton, M.D.   ANESTHESIA:  0.5% Marcaine local.   OPERATIVE FINDINGS:  The patient had no masses in the carpal canal and the  motor branch of the nerve was identified.   DESCRIPTION OF PROCEDURE:  Under 0.5% Marcaine local anesthesia with a  tourniquet on the right arm, the right hand was prepped and draped in the  usual fashion and after exsanguinating the limb, the tourniquet was inflated  to 250 mmHg.  A 3 cm longitudinal incision was made in the palm just ulnar  to the thenar crease.  Sharp dissection was carried through the subcutaneous  tissues and bleeding points were coagulated.  Blunt dissection was carried  through the superficial palmar fascia distal to the transverse carpal  ligament.  A hemostat was placed in the carpal canal up against the hook of  the hamate and the transverse carpal ligament was divided on the ulnar  border of the medial nerve.  The proximal end of the ligament was divided  after dissecting the nerve away from the undersurface of the ligament.  The  carpal canal was then palpated and was found to be adequately decompressed.  The nerve was examined and the motor branch identified.  The wound was then  irrigated with saline.  The skin was closed with 4-0 nylon sutures.  Sterile  dressings were applied followed by a volar wrist splint.  The  patient  tolerated the procedure well and went to the recovery room awake and stable  in  good condition.                                                Lowell Bouton, M.D.    EMM/MEDQ  D:  08/09/2002  T:  08/09/2002  Job:  [id number]   cc:   Lunette Stands, M.D.  [address]  [place] [address]  Fax: [phone]

## 2010-09-26 NOTE — H&P (Signed)
NAME:  Katherine [name], Katherine [name] NO.:  [id number]   MEDICAL RECORD NO.:  [id number]                   PATIENT TYPE:  INP   LOCATION:                                       FACILITY:  [facility]   PHYSICIAN:  Mila Homer. Sherlean Foot, M.D.              DATE OF BIRTH:  [birth year]   DATE OF ADMISSION:  09/10/2003  DATE OF DISCHARGE:                                HISTORY & PHYSICAL   CHIEF COMPLAINT:  Bilateral knee pain since childhood, now left worse than  right.   HISTORY OF PRESENT ILLNESS:  This 60 year old black female patient presented  to Dr. Mila Homer. Lucey with a long history of knee problems.  She reports  as a child she had to have her patellae realigned, and had problems with  pain in both knees on and off since that time.  She has had no other  injuries to her knees.  At this point, the left knee is more painful than  the right, although the right is catching up to the left.  The left knee pain is described as a constant dull aching sensation at  times, which can become very sharp and stabbing.  The pain seems to be  diffuse about the joint, without radiation.  Pain increases with any  prolonged weightbearing and decreases with sleeping or the use of capsaicin  and ibuprofen.  The knee does grind and swell.  It also keeps her up at  night.  There is no popping, catching, locking, or giving way.  She does not  ambulate with any assistive devices.  She has had multiple cortisone shots  in both knees, but they have started to lose their effectiveness.   ALLERGIES:  ERYTHROMYCIN causes chest pain.   CURRENT MEDICATIONS:  1. Premarin 0.9 mg, one tab p.o. q.a.m.  2. Ibuprofen 800 mg, one tab p.o. t.i.d. p.r.n. pain.  Last dose to be on     September 05, 2003.   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  She denies any history of diabetes mellitus, hypertension, thyroid disease,  hiatal hernia, reflux, peptic ulcer disease, heart  disease, asthma, or any  other chronic medical condition, other than noted previously.   PAST SURGICAL HISTORY:  1. Bilateral patellar realignments in 1967.  2. Tonsillectomy at age 61.  3. Right ganglion removal of the wrist in 1970.  4. Left wrist ganglion removal in 1970.  5. Hysterectomy in 1991.  6. Surgery for tendinitis of the left wrist x2 by Dr. Tennis Must.     Meyerdierks.  7. Right lateral epicondyle release by Dr. Sherlean Foot in 2001.  8. Right shoulder arthroscopy with rotator cuff repair by Dr. Lunette Stands in     2001.  9. Left shoulder open rotator cuff repair and arthroscopy by Dr. Sherlean Foot in     2003.  10.      Right  carpal tunnel release in 2004.  11.      Removal of kidney stones x2.  She reports that the only complication she gets from surgery is severe  nausea with anesthesia.   SOCIAL HISTORY:  She denies any history of cigarette smoking, alcohol use,  or drug use.  She is single and has one daughter.  She lives with her  brother and her mother in a one-story house with one step into the main  entrance.  She currently works in a clerical position.  Her medical doctor  is Dr. Valetta Mole. Swords at [facility].  His phone [phone].  He has  cleared her for surgery.   FAMILY HISTORY:  Mother is alive at age 53, with a history of a benign  recurrent brain tumor, osteoarthritis and sleep apnea.  Her father passed  away, but she knows nothing about him.  She has two brothers ages 63 and 66,  and the 23 year old with arthritis and hypertension.  She did have one  brother who died at age 39 with hepatitis-C and liver cancer.  She has five  sisters ages 32, 20, 52, 36, and 98.  They are all alive and well.  Her  daughter is 87 years old, and she does have some arthritis.   REVIEW OF SYSTEMS:  She does wear glasses.  She does have occasional  epistaxis due to dry heat, but she treats that with ice and it goes away.  She does have seasonal allergies.  She reports that  she had a history of an  irregular heart beat that was evaluated, and the workup was totally  negative.  She does have complaints of arthritis in her hands and shoulders,  in addition to her knees.  All other systems are negative and  noncontributory.  She does not have a living will or a power of attorney.   PHYSICAL EXAMINATION:  GENERAL:  A well-nourished, well-developed overweight  black female, in no acute distress.  Height 5 feet 5 inches, weight 225  pounds.  BMI is 36.5.  VITAL SIGNS:  Temperature 98.2 degrees F, pulse 84, respirations 20, blood  pressure 126/88.  HEENT:  Normocephalic, atraumatic without frontal or maxillary sinus  tenderness to palpation.  Conjunctivae pink.  Sclerae anicteric.  Pupils  equal, round, reactive to light and accommodation.  EOM's intact.  No  visible external ear deformities.  Hearing is grossly intact.  Tympanic  membranes pearly gray bilaterally with good light reflex.  A moderate amount  of cerumen in each ear canal.  Nose and nasal septum midline  Nasal mucosa  pink and moist, without exudates or polyps noted.  Buccal mucosa pink and  moist.  Good dentition.  Pharynx without erythema or exudates.  Tongue and  uvula midline.  Tongue without fasciculations.  Uvula rises equally with  phonation.  NECK:  No visible masses or lesions noted.  Trachea midline.  No palpable  lymphadenopathy or thyromegaly.  Carotids +2 bilaterally without bruits.  Full range of motion and nontender to palpation along the cervical spine.  She does have scattered papules noted about her face and neck.  CARDIOVASCULAR:  Heart rate and rhythm regular.  S1, S2 present without  rubs, clicks, or murmurs noted.  LUNGS:  Respirations even and unlabored.  Breath sounds clear to  auscultation bilaterally without rales or wheezes noted.  ABDOMEN:  A rounded abdominal contour.  Bowel sounds present x4 quadrants, soft, nontender to palpation, without hepatosplenomegaly or CVA  tenderness.  Femoral pulses were +1 bilaterally.  BACK:  Nontender to palpation along the entire length of the vertebral  column.  BREASTS/GU/RECTAL/PELVIC:  These examinations deferred at this time.  MUSCULOSKELETAL:  She has well-healed bilateral anterior shoulder incision  lines.  The right one is a little longer and a little bit more horizontal  than the left.  They both appear to have a bit of a keloid formation to  them.  She also has a well-healed lateral elbow incision line on the right.  She has a full range of motion of her upper extremities, and normal muscle  tone and bulk.  Radial pulses are +2.  She has a full range of motion of her  hips, ankles, and toes bilaterally.  DP and PT pulses are +2, and she does  have +2 lower extremity pitting edema bilaterally.  Right knee:  Has a well-healed curvilinear incision that starts medially and  then curves around the knee cap, and ends up on the lateral aspect of the  knee.  It is well-healed and approximated.  She has full extension and  flexion to 115 degrees.  No crepitus with range of motion, and no pain with  palpation along the joint line, or with range of motion.  She does have a  minimal effusion in the knee.  Negative apprehension and grind.  Stable to  varus and valgus stress.  A negative anterior drawer.  Left knee:  The skin is intact also, again with a curvilinear vertical  incision that starts medially and then curves across the knee cap and ends  laterally.  It is well-healed and approximated.  She has full extension and  flexion to 100 degrees, without crepitus.  No pain with palpation along the  joint line or with range of motion.  She does have a +1 effusion.  Stable to  varus and valgus stress.  Negative anterior drawer.  NEUROLOGIC:  Alert and oriented x3.  Cranial nerves II-XII grossly intact.  Strength 5/5 in the bilateral upper and lower extremities.  Rapid  alternating movements are intact.  Deep tendon  reflexes 2+ in the bilateral  upper and lower extremities.  Sensation intact to light touch.   RADIOLOGICAL FINDINGS:  X-rays taken of both knees in February 2003, showed  moderate osteoarthritis, worse on the left than on the right, and it seems  to be mostly medially and patellofemoral.   IMPRESSION:  1. End-stage osteoarthritis of the bilateral knees, left worse than the     right.  2. Osteoarthritis of bilateral hands.  3. Seasonal allergies.   PLAN:  The patient will be admitted to [facility] on  Sep 10, 2003, where she will undergo a left total knee arthroplasty by Dr.  Sherlean Foot.  She will undergo all the routine preoperative laboratory tests and  studies prior to this procedure.  If we have any medical issues while she is  hospitalized, we will consult the [facility] Hospitalist.      Legrand Pitts. Duffy, P.A.                      Mila Homer. Sherlean Foot, M.D.   KED/MEDQ  D:  09/03/2003  T:  09/03/2003  Job:  [id number]

## 2010-09-26 NOTE — Op Note (Signed)
[facility]  Patient:    Katherine [name], Katherine [name]                     MRN: [id number] Proc. Date: 06/16/00 Adm. Date:  [id number] Attending:  Kendell Bane                           Operative Report  PREOPERATIVE DIAGNOSIS:  Triquetral lunate tear and scapholunate tear, left wrist.  POSTOPERATIVE DIAGNOSIS:  Triquetral lunate tear and scapholunate tear, left wrist.  OPERATION PERFORMED:  Arthroscopic debridement of ligament tears, left wrist.  SURGEON:  Lowell Bouton, M.D.  ANESTHESIA:  General.  OPERATIVE FINDINGS:  The patient had a stable scapholunate and triquetral lunate ligament tear.  The scapholunate ligament was hanging down into the radiocarpal joint requiring debridement.  The articular surfaces were fairly smooth in both the radiocarpal and midcarpal joint.  The dorsal capsule was frayed significantly and there was inflammatory reaction.  The triangular fibrocartilage appeared to be intact.  DESCRIPTION OF PROCEDURE:  Under general anesthesia with the tourniquet on the left arm, the left hand was prepped and draped in the usual fashion and the index and middle fingers were suspended from the arthroscopy tower with 10 pounds of traction across the wrist.  The radiocarpal joint was inflated with 5 cc of saline.  An outflow was established with a cannula at the 6U portal. An 11 blade was used to make a stab wound at the 3-4 portal and the arthroscope was inserted using a blunt cannula.  After inserting the scope at the 3-4 portal, examination of the wrist revealed the obvious scapholunate ligament tear.  The radiocarpal joints were fairly smooth and there was no significant arthritic change.  A 4-5 portal was established with a blunt cannula and the shaver was placed through the cannula.  The aggressive shaver was used to resect the portion of the scapholunate ligament hanging down into the joint.  The dorsal  capsular tissue likewise was debrided.  The shaver was then removed and a nerve hook inserted in the 4-0 portal.  The triangular fibrocartilage was examined and was found to be intact without any tears.  The ulnar side of the joint was examined and the triquetral lunate ligament had just a small punctate tear.  There was no debris hanging down into the ulnocarpal joint.  The arthroscope was then removed and a radial midcarpal portal was made.  The arthroscope was inserted using a blunt trocar in the scaphocapitate interval.  There was a fair amount of debris and fraying of the capsular tissues in the midcarpal joint, so the shaver was placed in an ulnar midcarpal portal.  The midcarpal joint was debrided with the shaver. Examination of the ligaments from the midcarpal joint showed no gaping between the bones and no instability.  The arthroscope and shaver were then removed. The tourniquet had been inflated during the procedure and was released.  The portals were closed with 4-0 nylon suture.  Sterile dressings were applied followed by a volar wrist splint.   The patient tolerated the procedure well and went to the recovery room awake and stable in good condition. DD:  06/17/00 TD:  06/17/00 Job: [id number]

## 2010-09-26 NOTE — Op Note (Signed)
NAME:  Katherine [name], Katherine [name] NO.:  [id number]   MEDICAL RECORD NO.:  [id number]          PATIENT TYPE:  INP   LOCATION:  2550                         FACILITY:  [facility]   PHYSICIAN:  Mila Homer. Sherlean Foot, M.D. DATE OF BIRTH:  14-Mar-1951   DATE OF PROCEDURE:  11/10/2004  DATE OF DISCHARGE:                                 OPERATIVE REPORT   SURGEON:  Mila Homer. Sherlean Foot, M.D.   ASSISTANT:  Jamelle Rushing, P.A.   ANESTHESIA:  General.   PREOPERATIVE DIAGNOSIS:  Right knee osteoarthritis of the knee.   POSTOPERATIVE DIAGNOSIS:  Right knee osteoarthritis of the knee.   PROCEDURE:  Right total knee arthroplasty.   INDICATIONS FOR PROCEDURE:  The patient is a 60 year old black female who  failed conservative measures for osteoarthritis of the right knee.  Informed  consent was obtained.   DESCRIPTION OF PROCEDURE:  The patient was laid supine and administered  general anesthesia, and the right leg was then sterilely prepped in the  usual fashion.  The leg was then exsanguinated with Esmarch and the  tourniquet inflated to 350 mmHg and set for an hour.  The old incision was  used, and curvilinear anterior incision was made incorporating this old  incision, and this was made with a #10 Blake.  Then used a fresh blade to  make a straight medium parapatellar arthrotomy.  I performed a synovectomy  with a #10 blade and pickups.  I then elevated the deep MCL off the medial  crest of the tibia around to the semimembranosus tendon.  Then I inverted  the patella.  It measured 23 mm.  I reamed down to 14 mm and used the 32 mm  template to drill three lag holes.  With the prosthetic trial in place, it  also measured 23 mm.  I then removed the prosthetic trial and went into  flexion.  I cut the ACL and the PCL to deliver the tibia anteriorly.  I then  used the extramedullary alignment system and removed the perpendicular cut  surface of the joint, making that cut with a sagittal saw.  I  then made an  intermedullary drill hole in the femur and removed the extramedullary guide  on the tibia.  I set the distal femoral cutting block for the femur on a 6-  degree valgus cut and pinned it into place.  I removed the intramedullary  guide and made the distal femoral cut with the sagittal saw.  I removed the  cutting block.  I drew out the epicondylar access.  The posterior condylar  angle measured 3 degrees.  I used the sizer, sized to a size D, pins to the  3-degree external rotation holes.  I then screwed the 4:1 cutting block into  place and made the anterior, posterior and chamfer cuts with the sagittal  saw.  I removed the cutting block from the cut surfaces of the bones.  I put  an alignment spur in the knee and removed the medial and lateral menisci  posterior condylar osteophytes and ACL and PCL.  I then placed a 12 mm  spacer block, and the knee had good flexion, extension guide balance and  excellent alignment.  I then finished the femur with a size D finishing  block drill and saw.  I finished the tibia with a size 3 tibial tray drill  and keel.  I then trialed with a size 3 tibia, size 3 femur, size 12 insert  and size 32 patella.  Had excellent flexion/extension gap balance, drop and  dangle was back to about 120 degrees with some effort back to 130 degrees.  The patella tracked well.  I then removed the components and copiously  irrigated.  I then cemented in the tibia first, femur second, and removed  excess cement.  I then snapped in the __________ polyethelene, located knee  in extension, and cemented in the patella.  I removed excess cement there,  as well.  I placed a Hemovac coming out superolaterally and deep to the  arthrotomy.  When the cement was dry, I let the tourniquet down and  cauterized bleeding vessels.  I then lavaged again with normal saline.  I  then closed the arthrotomy with interrupted figure-of-eight #1 Vicryls to  deep soft tissues,  interrupted 0 Vicryls and the skin a subcuticular stitch  and skin staples.  I dressed with Adaptic 4 x 4's, sterile Webril, and TED  stocking.   COMPLICATIONS:  None.   DRAINS:  None.   TOURNIQUET TIME:  48 minutes.       SDL/MEDQ  D:  11/10/2004  T:  11/10/2004  Job:  [id number]

## 2010-09-26 NOTE — Discharge Summary (Signed)
[name], [name] NO.:  [id number]   MEDICAL RECORD NO.:  [id number]                   PATIENT TYPE:  INP   LOCATION:  5038                                 FACILITY:  [facility]   PHYSICIAN:  Mila Homer. Sherlean Foot, M.D.              DATE OF BIRTH:  1950/11/04   DATE OF ADMISSION:  09/10/2003  DATE OF DISCHARGE:  09/13/2003                                 DISCHARGE SUMMARY   ADMITTING DIAGNOSIS:  1. End stage osteoarthritis bilateral knees, left worse than right.  2. Osteoarthritis of bilateral hands.  3. Seasonal allergies.   DISCHARGE DIAGNOSES:  1. Status post left total knee arthroplasty.  2. Acute blood loss anemia secondary to surgery.  3. End stage osteoarthritis, right knee.  4. Osteoarthritis bilateral hands.  5. Seasonal allergies.   HISTORY OF PRESENT ILLNESS:  Katherine [name] is a 60 year old female with a  long history of bilateral knee problems.  The patient reports as a child had  a patella realigned, has had pain and problems with both knees off and on  since that time.  The patient has had no other injuries to either knee.  At  this point, the left knee is much more painful than the right.  She  describes her left knee pain as a constant aching sensation which at times  can be very sharp, stabbing like in nature.  The pain seems to be diffuse  about the knee joint without radiation.  The pain increases with prolonged  weightbearing.  It decreases with sleep or the use of __________ or  ibuprofen.  The pain does keep her awake at night.  She describes no  mechanical symptoms.  She uses no assisting devices.  The patient has  undergone multiple cortisone shots of both knees, however, has had  decreasing efficacy.   X-rays of both knees, February 2003, showed moderate osteoarthritis worse on  the left than right; therefore, the patient was admitted [facility], on Sep 10, 2003, to undergo a left total knee arthroplasty.   ALLERGIES:  ERYTHROMYCIN causes chest pain.   CURRENT MEDICATIONS:  1. Premarin 0.9 mg one tab p.o. q.a.m.  2. Ibuprofen 800 mg one tab p.o. t.i.d. p.r.n. pain.   SURGICAL PROCEDURE:  The patient was taken to the operating room, by Dr.  Mila Homer. Lucey, assisted by Legrand Pitts Duffy, P.A., on Sep 10, 2003.  The  patient was placed under general anesthesia and also given a prep femoral  nerve block in the left leg.  The following components were used for the  left total knee arthroplasty:  Size 3 tibia, size D femur, size 12 insert,  and size 32 patella.  The patient tolerated the procedure and returned to  recovery in good stable condition.   CONSULTS:  The following consults were obtained while the patient was  hospitalized:  1. PT.  2. OT.  3. Rehab.  4.  Case management.   HOSPITAL COURSE:  The patient developed acute blood loss anemia secondary to  surgery, however, remained asymptomatic, did not require any blood products.  The patient remained afebrile throughout her hospital stay.  Vital signs  remained stable, and she progressed well with physical therapy, therefore,  was discharged home post-op day three.   LABS:  CBC, on admission, normal.  CBC, on Sep 13, 2003, white blood count  8.2, hemoglobin 9.7, hematocrit 28.1, platelets 193.  Coags, on admission,  were normal.  Routine chemistries, on admission, were all within normal  range.  Hepatic enzymes, on admission, AST 38, ALT 23, ALP 68, total  bilirubin 0.5.  Urinalysis, on admission, was negative.   DISCHARGE INSTRUCTIONS:  The patient was to resume Premarin and add the  following medicines:  1. OxyContin 10 mg sustained release one tab q.12h.  2. Percocet 5 mg 1-2 tablets every four to six hours p.r.n. breakthrough     pain.  3. Lovenox 40 mg one injection daily at 8 a.m. for a total of 11 days.  4. Iron 325 mg one tab every day for a total of 30 days.   ACTIVITY:  The patient is weightbearing as tolerated with  walker.  Home  health PT with [facility].   DIET:  Regular diet, no restrictions.   WOUND CARE:  The patient is to keep the wound clean and dry.  Dressing  changes daily.  May shower after two days of no drainage.   Call the office is temperature is greater than 101.5, chills, pain not  controlled with pain medications, or any signs of infection.   SPECIAL INSTRUCTIONS:  CPM 0-90 degrees 6-8 hours a day, increase by 10  degrees daily.   FOLLOW UP:  The patient needs to followup with Dr. Sherlean Foot approximately 10  days from time of discharge.  The patient is to call the office at [phone]  to make an appointment.      Richardean Canal, P.A.                       Mila Homer. Sherlean Foot, M.D.    GC/MEDQ  D:  09/28/2003  T:  09/30/2003  Job:  [id number]

## 2010-09-26 NOTE — Op Note (Signed)
NAME:  ELEASE, SWARM NO.:  [id number]   MEDICAL RECORD NO.:  [id number]                   PATIENT TYPE:  AMB   LOCATION:  DSC                                  FACILITY:  [facility]   PHYSICIAN:  Mila Homer. Sherlean Foot, M.D.              DATE OF BIRTH:  01-May-1951   DATE OF PROCEDURE:  04/26/2002  DATE OF DISCHARGE:                                 OPERATIVE REPORT   PREOPERATIVE DIAGNOSES:  Left shoulder rotator cuff tear and subacromial  impingement syndrome.   POSTOPERATIVE DIAGNOSES:  Left shoulder rotator cuff tear and subacromial  impingement syndrome.   PROCEDURES:  1. Left shoulder arthroscopy with glenohumeral debridement.  2. Shoulder decompression.  3. Mini-open rotator cuff repair.   SURGEON:  Mila Homer. Sherlean Foot, M.D.   ASSISTANT:  Jamelle Rushing, P.A.   ANESTHESIA:  General.   INDICATIONS FOR PROCEDURE:  The patient is a 60 year old black female with  MRI showing full-thickness rotator cuff tear.   DESCRIPTION OF PROCEDURE:  The patient was placed supine, given general  anesthesia and a preoperative intrascalene block.  She was then beach chair  position.  Left shoulder prepped and draped in usual sterile fashion.  Anterior and posterior portals were created with a #11 blade, blunt trocar,  and cannula.  Intra-articularly, there was lots of debris and synovitis as  well as a complex labral tear, complex subscapularis, and biceps tendon  tear.  A 3.5 gator shaver was used to debride the tendinous tears and  Arthrex arthroscopic repair debridement wand was used to debride things back  to stable tissue.  It hen went from posterior to anterior subacromial space,  made a direct lateral portal with a #11 blade, blunt trocar, and cannula,  proceeded to perform and aggressive bursectomy and anterolateral  acromioplasty. I then converted to the mini-open technique and extended the  lateral portal up another 2 cm, split the deltoid along its raphe,  and held  it in place with an Arthrex retractor.  I then completed the far lateral and  far anterior and posterior bursectomies and turned up the free edge of the  torn rotator cuff.  I then used the Arthroscopic bur to bur the area of the  humerus down to good bleeding bone, and then I placed two 5.0 mm biocortical  screws from Arthrex down into the bone through the tear.  I then placed two  vertical mattress and two simple stitches with the Viper, tied them down,  and there as no tension.  I then irrigated, closed with interrupted 0 Vicryl  in the deltoid interval, interrupted 2-0 Vicryl in the subcuticular layer,  Steri-Strips throughout.  Dressing was Xeroform dressing with sponge,  sterile Webril, and Ace wrap.    COMPLICATIONS:  None.   DRAINS:  None.  Mila Homer. Sherlean Foot, M.D.    SDL/MEDQ  D:  04/26/2002  T:  04/26/2002  Job:  [id number]

## 2010-09-26 NOTE — H&P (Signed)
NAME:  Katherine, [name] NO.:  [id number]   MEDICAL RECORD NO.:  [id number]          PATIENT TYPE:  INP   LOCATION:  NA                           FACILITY:  [facility]   PHYSICIAN:  Mila Homer. Sherlean Foot, M.D. DATE OF BIRTH:  21-Aug-1950   DATE OF ADMISSION:  11/10/2004  DATE OF DISCHARGE:                                HISTORY & PHYSICAL   CHIEF COMPLAINT:  Right knee pain since childhood.   HISTORY OF PRESENT ILLNESS:  This 60 year old black female patient presented  to Dr. Sherlean Foot with a long history of knee problems.  As a child she had to  have her patellas realigned and has had problems with pain in both knees  since that time.  She had a left total knee done by Dr. Sherlean Foot on Sep 10, 2003, and has done well with that, and she is presenting now with continued  right knee pain.   At this point the right knee pain is pretty much constant, but the severity  varies.  It can be aching and sharp at times.  The pain seems to be diffuse  about the joint without radiation.  She has had no other injuries to her  knee.  The pain does seem to increase with any prolonged weightbearing and  decreases with rest and then the use of capsaicin.  The knee does swell at  times and rarely keeps her up at night, but there is no popping, catching,  locking, grinding or giving away of the knee.  She does not ambulate with  any assistive devices.  She has received multiple cortisone shots in both  knees, but they have not been helping.  The left knee is doing well.   ALLERGIES:  ERYTHROMYCIN causes chest pain.   CURRENT MEDICATIONS:  1.  Premarin 0.9 mg one tablet p.o. q.a.m..  2.  Ibuprofen 800 mg one tablet p.o. q.8h. p.r.n. for pain.   PAST MEDICAL HISTORY:  Osteoarthritis, hands, knees and shoulders.   She denies any history of diabetes mellitus, hypertension, thyroid disease,  hiatal hernia, reflux, peptic ulcer disease, heart disease, asthma or any  other chronic medical condition other  than noted previously.   PAST SURGICAL HISTORY:  1.  Bilateral patellar realignments, 1967.  2.  Tonsillectomy, 1965.  3.  Excision of right ganglion of the wrist, 1970.  4.  Excision, ganglion, left wrist, 1970.  5.  Hysterectomy, 1991.  6.  Surgery for tendinitis, left wrist twice,  Dr. Lanora Manis Meyerdierks.  7.  Right lateral epicondylitis release by Dr. Mila Homer. Lucey, 2001.  8.  Right shoulder arthroscopy with open rotator cuff repair, Dr. Lunette Stands, in 2001.  9.  Left open rotator cuff repair and arthroscopy by Dr. Mila Homer. Lucey,      2003.  10. Right carpal tunnel release, 2004.  11. Removal of kidney stones twice, May 21, 1999, and May 20, 2001      by Dr. Brunilda Payor.  12. Left total knee arthroplasty by Dr. Mila Homer. Lucey, Sep 10, 2003.  13. Bilateral thumb trigger finger release, February  2006,  by Dr. Shirlee Latch.   She reports the only complication she gets with surgery is severe nausea.   SOCIAL HISTORY:  She denies any history of cigarette smoking, alcohol use or  drug use.  She is single and has one daughter.  She lives with her brother  and her mother a one-story house with one step into the main entrance.  She  currently works in a clerical position.  Her medical doctor is Dr. Birdie Sons at [facility].  His phone number is [phone].  She has been  seen and cleared for surgery.   FAMILY HISTORY:  Mother is alive at age 71 with history of benign recurrent  brain tumor, osteoarthritis and sleep apnea.  Father is deceased, but she knows nothing about him.  She has two brothers,  age 29 and 73, with the 56 year old with arthritis and hypertension.  She  had one brother died at age 52 with hepatitis C and liver cancer.  She has  five sisters, age 76, 79, 22, 23 and 67, and they are all alive and well.  Her daughter is 61, and she has arthritis.   REVIEW OF SYSTEMS:  She does wear glasses.  She has had chronic occasional   epistaxis due to dry heaves, but she treats that was ice then resolve.  She  has seasonal allergies.  She has a history of an irregular heartbeat many  years ago that was evaluated with a negative workup.  She does complain of  arthritis in her hands, shoulders and knees.  She has had problems with  continued nausea with sweet foods since her last surgery.  All other systems  are negative and noncontributory.  Does not have a living will nor a power  of attorney.   PHYSICAL EXAMINATION:  GENERAL:  A well-developed, well-nourished,  overweight black female in no acute distress.  Talks easily with examiner.  Mood and affect are appropriate.  Walks without a limp.  Height 5 feet 5  inches, weight 210 pounds, BMI is 34.  VITAL SIGNS:  Temperature 98.1 degrees Fahrenheit, pulse 80, respirations  15, and BP 126/88.  HEENT:  Normocephalic, atraumatic, without frontal or maxillary sinus  tenderness to palpation.  Conjunctivae pink. Sclerae anicteric. PERRLA.  EOMs intact.  She does have scattered papules across her face, neck and  chest.  She has what looks like a little bit of a vesicle on the outer  aspect of her right eye.  No drainage.  Hearing grossly intact.  No visible  external ear deformities.  Tympanic membranes pearly gray bilaterally with  good light reflex.  Unable to see the right TM due to occlusion with  cerumen.  Nose and nasal septum midline.  Nasal mucosa pink and moist without exudates  or polyps noted.  Buccal mucosa pink and moist.  Dentition in good repair.  Pharynx without  erythema or exudates.  Tongue and uvula midline.  Tongue without fasciculations, and uvula rises  equally with phonation.  NECK:  No visible masses or lesions noted.  Trachea midline.  No palpable  lymphadenopathy nor thyromegaly.  Carotids +2 bilaterally without bruits.  Full range of motion, nontender to palpation along the cervical spine. CARDIOVASCULAR:  Heart rate and rhythm regular.  S1, S2  present without  rubs, clicks or murmurs noted.  RESPIRATORY:  Respirations even and unlabored.  Breath sounds clear to  auscultation bilaterally without rales or wheezes noted.  ABDOMEN:  Rounded abdominal contour.  Bowel sounds present x4 quadrants.  She is soft, nontender to palpation without hepatosplenomegaly nor CVA  tenderness.  Femoral pulses +2 bilaterally.  Nontender to palpation along  the vertebral column.  BREASTS, GENITOURINARY, RECTAL, PELVIC:  These exams deferred at this time.  MUSCULOSKELETAL:  She has well-healed bilateral anterior shoulder incision  lines.  Right is a little longer and a little more horizontal than the left.  They have a bit of a keloid formation to them.  She has a well-healed  lateral elbow incision line on the right.  Full range of motion of her upper  extremities without pain.  Radial pulses +2.  She has full range of motion  of her hips, ankles and toes bilaterally.  DP and PT pulses are +2.  She does have lower extremity edema, probably +2,  but it is nonpitting.  Left knee:  Skin is intact with a curvilinear  vertical incision, starts medially and curves across the kneecap, ends  laterally.  It is well-healed and approximated.  It has slightly widened.  She has full extension of the knee and flexion to 120 degrees without  crepitus.  No pain with palpation along the joint line or with range of  motion.  She is stable to varus and valgus stress, negative anterior drawer.   Right knee has a well-healed curvilinear incision starting medially and  curving around the kneecap, ending laterally.  It is also well-healed and  approximated.  No erythema or ecchymosis.  She has full extension and  flexion to 115 degrees with minimal crepitus.  No pain with palpation along  the joint line, no effusion.  She is stable to varus and valgus stress,  negative anterior drawer.  She does appear to be resting in about 10 degrees  of valgus position.    NEUROLOGIC:  Alert and oriented x3.  Cranial nerves II-XII are grossly  intact.  Strength 5/5 bilateral upper and lower extremities.  Rapid  alternating movements intact.  Deep tendon reflexes 2+, bilateral upper and  lower extremities.   RADIOLOGIC FINDINGS:  X-rays taken of her right knee in May 2006 show bone-  on-bone contact in the medial compartment of the knee with collapse.  X-rays  of the total knee on the left at that time show prosthesis in good position  and alignment.   IMPRESSION:  1.  End-stage osteoarthritis, bilateral knees, status post left knee      replacement in May 2005.  2.  Osteoarthritis, bilateral hands and shoulders.  3.  Seasonal allergies.  4.  History of episodic epistaxis.   PLAN:  Katherine [name] will be admitted to [facility] on November 10, 2004,  where she will undergo a right total knee arthroplasty by Dr. Mila Homer.  Lucey.  She will undergo all the routine preoperative laboratory tests and studies prior to this procedure.  If we have any medical issues while she is  hospitalized, we will consult [facility].      [name]   KED/MEDQ  D:  11/03/2004  T:  11/03/2004  Job:  [id number]

## 2010-09-26 NOTE — Discharge Summary (Signed)
NAME:  Katherine [name], PARKISON NO.:  [id number]   MEDICAL RECORD NO.:  [id number]          PATIENT TYPE:  INP   LOCATION:  5009                         FACILITY:  [facility]   PHYSICIAN:  Mila Homer. Sherlean Foot, M.D. DATE OF BIRTH:  [birth year]   DATE OF ADMISSION:  11/10/2004  DATE OF DISCHARGE:  11/14/2004                                 DISCHARGE SUMMARY   ADMISSION DIAGNOSES:  1.  End-stage osteoarthritis bilateral knees, status post left knee      replacement in May 2005.  2.  Osteoarthritis bilateral hands and shoulders.  3.  Seasonal allergies.  4.  History of episodic epistaxis.   DISCHARGE DIAGNOSES:  1.  End-stage osteoarthritis bilateral knees, status post right knee      replacement and history of left knee replacement May of 2005.  2.  Acute blood loss anemia secondary to surgery.  3.  Constipation now resolved.  4.  Osteoarthritis bilateral hands and shoulders.  5.  Seasonal allergies.  6.  History of episodic epistaxis.   PROCEDURE:  November 10, 2004, Ms. Shirkey underwent a right total knee  arthroplasty by Dr. Mila Homer. Lucey, assisted by Jamelle Rushing, P.A.-C.  She had an all-poly patella NexGen complete, standard size 32 mm, 8.5 mm  thickness.  A femoral component option size D right with a fluted stem  tibial component size 3 and an articular surface size yellow CD 12 mm  height.   COMPLICATIONS:  None.   CONSULTATION:  Case management physical therapy consult November 11, 2004.   HISTORY OF PRESENT ILLNESS:  This 60 year old black female patient presented  to Dr. Sherlean Foot with a long history of knee problems.  She had her left knee  done in May of 2005, and has done well.  Right knee is now bothering her at  all times and it is kind of an aching and sharp sensation diffuse about the  joint without radiation.  It increases with prolonged weightbearing and  decreases with rest.  It swells at times and occasionally keeps her up at  night.  She has failed  conservative treatment and x-rays show end-stage  arthritic changes.  Because of this, she is presenting for a right knee  replacement.   HOSPITAL COURSE:  Ms. Marton tolerated her surgical procedure well without  immediate postoperative complications.  She was transferred to 5000.  On  postoperative day #1, she had some nausea and that was resolved with  discontinuing the PCA.  Vitals were stable. She was started on therapy.   On postoperative day #2, afebrile.  Hemoglobin 9.2, hematocrit 26.7.  Incision was well approximated with staples and she was continued on  therapy.   On postoperative day #3, she was a little dizzy and lightheaded the day  before; with ambulation, almost passed out.  Her hemoglobin was 7.9 with  hematocrit of 22.7.  Pulse was 113, blood pressure 124/possibly 20.  She was  subsequently transfused with two units of packed red blood cells.  She had  constipation, treated with a laxative.   On postoperative day #4, she was doing much better and  feels better after  the transfusion.  She is now ambulating 130 feet with minimal assist.  T-max  is 100.3.  Hemoglobin 10.2, hematocrit 28.6.  It is felt she is ready for  discharge home and will be discharged home later today.   DISCHARGE INSTRUCTIONS:  1.  Diet:  She can resume her regular prehospitalization diet.  2.  Medications:  She may resume her prehospitalization medications except      no ibuprofen while on Lovenox.  Home medications included:      1.  Premarin 0.9 mg p.o. q.a.m.      2.  Ibuprofen 800 mg p.o. q.8h. p.r.n.  3.  Additional medications at this time include:      1.  Lovenox 40 mg subcu once a day, last dose November 24, 2004.      2.  Percocet 5/325 mg one to two tablets p.o. q.4h. p.r.n. for pain, 50          with no refill.      3.  Robaxin 500 mg one to two tablets p.o. q.6h. p.r.n. for spasms, 40          with no refill.      4.  Iron supplement one tablet twice a day for one month.  4.   Activity:  She can be weightbearing as tolerated on the right leg with      the use of a walker.  She is arranged for home CPM 0 to 100 degrees six      to eight hours a day and home health PT per [facility].      Please see the blue total knee discharge sheet for further activity      instructions.  5.  Wound care:  She may shower after no drainage from the wound for two      days.  Please see the blue total knee discharge sheet for further wound      care instructions.  6.  Follow-up:  She is to follow up with Dr. Sherlean Foot in our office on November 25, 2004, and needs to call [phone] for that appointment.   LABORATORY DATA:  Chest x-ray done on December 03, 2004, showed no active  disease.   Hemoglobin and hematocrit ranged from 13.9 and 40.2 on November 03, 2004, to a  low of 7.9 and 22.7 on November 13, 2004, and is now on November 14, 2004, at 10.2  with hematocrit of 28.6.  White count is 6.9 and platelets are 192.   Sodium dropped to a low of 134 on November 11, 2004.  Glucose went from 87 on  November 03, 2004, to 136 on November 11, 2004.  BUN 9 with a creatinine of 0.7 on  December 03, 2004, and on November 11, 2004, was 4 with a creatinine of 0.8.  Calcium  was 8.1 on November 11, 2004.  All other laboratory studies were within normal  limits.      Katherine [name]   KED/MEDQ  D:  11/14/2004  T:  11/14/2004  Job:  [id number]   cc:   Valetta Mole. Swords, M.D. [facility]

## 2010-09-26 NOTE — Op Note (Signed)
Katherine [name], Katherine [name] NO.:  [id number]   MEDICAL RECORD NO.:  [id number]                   PATIENT TYPE:  INP   LOCATION:  2550                                 FACILITY:  [facility]   PHYSICIAN:  Mila Homer. Sherlean Foot, M.D.              DATE OF BIRTH:  1950/05/30   DATE OF PROCEDURE:  09/10/2003  DATE OF DISCHARGE:                                 OPERATIVE REPORT   PREOPERATIVE DIAGNOSIS:  Left knee osteoarthritis.   POSTOPERATIVE DIAGNOSIS:  Left knee osteoarthritis.   OPERATION PERFORMED:  Left total knee arthroplasty.   SURGEON:  Mila Homer. Sherlean Foot, M.D.   ASSISTANT:  Legrand Pitts. Duffy, P.A.   ANESTHESIA:  General plus preop femoral nerve block.   INDICATIONS FOR PROCEDURE:  The patient is a 60 year old white female with  failure of conservative measures for osteoarthritis of the knee.  Informed  consent was obtained.   DESCRIPTION OF PROCEDURE:  The patient was laid supine and administered  general anesthesia after preop femoral nerve block in the preanesthesia  holding area.  The left lower extremity was prepped and draped in the usual  sterile fashion. A #10 blade was used to make a midline incision.  Fresh  blade was used to make a median parapatellar arthrotomy.  This blade was  also used to dissect the deep medial collateral ligament off the medial  crest of the tibia subperiosteally.  I then performed synovectomy.  I then  everted the patella and measured it at 25 mm .  I reamed down to 16 mm with  a 32 mm reamer and removed the excess bone and with a template, drilled  three lug holes and with the prosthetic trial in place, it also measured 25  mm thick.  I removed the prosthetic trial, left the knee cap everted and  went into flexion.  I cut the anterior cruciate ligament and posterior  cruciate ligament just below the tibia anteriorly.  I then used the  extramedullary alignment system to remove a perpendicular cut surface of the  tibia articular  surface removing 2 mm off the medial plateau which was the  low side.  I then removed the cut surface of the bone as well as the  extramedullary guide.  I then turned my attention to the femur.  I made an  intramedullary drill hole.  I placed the intramedullary guide set on 6  degree valgus cut.  I pinned that into place and then placed a distal  femoral cutting block in place.  I made the distal femoral cut with a  sagittal saw.  I drew the epicondylar axis and measured the posterior  condylar angle to be three degrees.  I then sized to a size D and pinned  through the 3 degree external rotation holes.  I then made our anterior,  posterior and chamfer cuts.  I removed the 4 in 1 cutting block and removed  all the cut pieces of the bone.  I then placed a lamina spreader in the knee  and removed the medial and lateral menisci, the posterior condylar  osteophytes and the ACL and the PCL.  I then placed a 12 spacer block in the  knee and had good flexion and extension gap balance, good alignment of the  tibial cut.  I then used the scope retractor to expose the femur and finish  the femur with a size D finishing block, cutting the notch and drilling the  keels.  I then subluxed the tibia anteriorly and with a Homan retractor in  place, templated to a size 3, pinned that tray into place and drilled and  keeled to prepare for the tibia.  I then trialed with a size 3 tibia, size D  femur, size 1     2 insert and size 32 patella.  I had excellent flexion and  extension gap balance and drop and dangle was to 130 degrees.  Patellar  tracking was perfect.  I then removed the trial components, copiously  irrigated and cemented in a size 3 tibia, size D femur, snapped in the 12  inserted, located the knee and put it in extension, then cemented in the  patella.  At this point when the cement was hardened, the tourniquet was let  down.  This was at 55 minutes.  I then irrigated again and cauterized all   bleeding vessels.  I then left the Hemovac coming out superolateral deep to  the arthrotomy. I closed the arthrotomy with figure-of-eight #1 Vicryl  sutures, the deep soft tissue with interrupted 0 Vicryl sutures, then a  subcuticular 2-0 Vicryl stitch and skin staples.  I dressed with Xeroform,  dressing sponges, sterile Webril and an ABD and a TED stocking.   COMPLICATIONS:  None.   DRAINS:  Hemovac.   ESTIMATED BLOOD LOSS:  300 mL.   TOURNIQUET TIME:  55 minutes.                                               Mila Homer. Sherlean Foot, M.D.    SDL/MEDQ  D:  09/10/2003  T:  09/10/2003  Job:  [id number]

## 2010-09-26 NOTE — Procedures (Signed)
[facility]  Patient:    Katherine [name], Katherine [name]                     MRN: [id number] Proc. Date: 07/12/00 Adm. Date:  [id number] Attending:  Lindaann Slough                           Procedure Report  PREOPERATIVE DIAGNOSIS:  Left ureteral stone.  POSTOPERATIVE DIAGNOSIS:  Left ureteral stone.  PROCEDURES PERFORMED: 1. Cystoscopy. 2. Left retrograde pyelogram. 3. Ureteroscopy with Holmium laser ablation of ureteral stone with    extraction of stone fragments. 4. Insertion of double J catheter.  SURGEON: Lindaann Slough, M.D.  ANESTHESIA:  General.  INDICATIONS:  The patient is a 60 year old female who had been complaining of left flank and left lower quadrant pain. An IVP showed a 5 x 8 mm stone in the left and distal ureter with proximal hydronephrosis. She is scheduled today for cystoscopy and stone manipulation.  DESCRIPTION OF PROCEDURE:  Under general anesthesia, the patient was prepped and draped and placed in the dorsal lithotomy position. A #21 Wappler cystoscope was inserted in the bladder. The bladder mucosa is normal. There is stone or tumor in the bladder. The ureters is visualized and of normal position and shape. A cone-tipped catheter was then passed down through the cystoscope into the left ureter orifice. Contrast was then injected through the ureteral catheter. The catheter could not be passed beyond a stone in the distal ureter. The cone-tipped catheter was then removed. Several attempts were made to pass the Glidewire beyond the stone but were unsuccessful. The cystoscope was then removed. The ureteroscope was then passed in the bladder and into the distal ureter. A stone is visualized in the distal ureter. The stone was then fragmented with the Holmium laser and the fragments were extracted with the stone basket. The guidewire was then passed down through the ureteroscope into the collecting system and the ureteroscope was  advanced through the ureter. There was no evidence of remaining stone fragments in the ureter. The ureteroscope was then removed. The guidewire was then backloaded into the cystoscope. A 6 x 26 double J catheter was then passed over the guidewire. The proximal end of the catheter is in the collecting system and the distal end is in the bladder. The bladder was then emptied, the cystoscope and guidewire were removed. The patient tolerated the procedure well and left the operating room in satisfactory condition to the post anesthesia care unit. DD:  07/12/00 TD:  07/12/00 Job: [id number]

## 2010-10-02 ENCOUNTER — Telehealth: Payer: Self-pay | Admitting: Internal Medicine

## 2010-10-13 ENCOUNTER — Encounter: Payer: Self-pay | Admitting: Internal Medicine

## 2010-11-27 ENCOUNTER — Ambulatory Visit: Payer: 59 | Attending: Obstetrics and Gynecology | Admitting: Physical Therapy

## 2010-11-27 DIAGNOSIS — M2420 Disorder of ligament, unspecified site: Secondary | ICD-10-CM | POA: Insufficient documentation

## 2010-11-27 DIAGNOSIS — M25559 Pain in unspecified hip: Secondary | ICD-10-CM | POA: Insufficient documentation

## 2010-11-27 DIAGNOSIS — M629 Disorder of muscle, unspecified: Secondary | ICD-10-CM | POA: Insufficient documentation

## 2010-11-27 DIAGNOSIS — IMO0001 Reserved for inherently not codable concepts without codable children: Secondary | ICD-10-CM | POA: Insufficient documentation

## 2010-12-02 ENCOUNTER — Ambulatory Visit: Payer: 59 | Admitting: Physical Therapy

## 2010-12-11 ENCOUNTER — Ambulatory Visit: Payer: 59 | Attending: Obstetrics and Gynecology | Admitting: Physical Therapy

## 2010-12-11 DIAGNOSIS — M25559 Pain in unspecified hip: Secondary | ICD-10-CM | POA: Insufficient documentation

## 2010-12-11 DIAGNOSIS — M2420 Disorder of ligament, unspecified site: Secondary | ICD-10-CM | POA: Insufficient documentation

## 2010-12-11 DIAGNOSIS — IMO0001 Reserved for inherently not codable concepts without codable children: Secondary | ICD-10-CM | POA: Insufficient documentation

## 2010-12-11 DIAGNOSIS — M629 Disorder of muscle, unspecified: Secondary | ICD-10-CM | POA: Insufficient documentation

## 2010-12-16 ENCOUNTER — Encounter: Payer: 59 | Admitting: Physical Therapy

## 2010-12-25 ENCOUNTER — Ambulatory Visit: Payer: 59 | Admitting: Physical Therapy

## 2011-01-06 ENCOUNTER — Encounter: Payer: 59 | Admitting: Physical Therapy

## 2011-02-11 ENCOUNTER — Other Ambulatory Visit (INDEPENDENT_AMBULATORY_CARE_PROVIDER_SITE_OTHER): Payer: 59

## 2011-02-11 DIAGNOSIS — T887XXA Unspecified adverse effect of drug or medicament, initial encounter: Secondary | ICD-10-CM

## 2011-02-11 DIAGNOSIS — E785 Hyperlipidemia, unspecified: Secondary | ICD-10-CM

## 2011-02-11 DIAGNOSIS — N390 Urinary tract infection, site not specified: Secondary | ICD-10-CM

## 2011-02-11 LAB — POCT URINALYSIS DIPSTICK
Bilirubin, UA: NEGATIVE
Glucose, UA: NEGATIVE
Ketones, UA: NEGATIVE
Protein, UA: NEGATIVE

## 2011-02-11 LAB — BASIC METABOLIC PANEL
CO2: 29 mEq/L (ref 19–32)
Chloride: 102 mEq/L (ref 96–112)
Sodium: 138 mEq/L (ref 135–145)

## 2011-02-11 LAB — HEPATIC FUNCTION PANEL
ALT: 39 U/L — ABNORMAL HIGH (ref 0–35)
Alkaline Phosphatase: 78 U/L (ref 39–117)
Bilirubin, Direct: 0 mg/dL (ref 0.0–0.3)
Total Protein: 7.5 g/dL (ref 6.0–8.3)

## 2011-02-11 LAB — LDL CHOLESTEROL, DIRECT: Direct LDL: 139.1 mg/dL

## 2011-02-11 NOTE — Progress Notes (Signed)
Addended by: Bonnye Fava on: 02/11/2011 11:48 AM   Modules accepted: Orders

## 2011-02-11 NOTE — Progress Notes (Signed)
Addended by: Rossie Muskrat K on: 02/11/2011 11:50 AM   Modules accepted: Orders

## 2011-02-13 LAB — URINE CULTURE: Colony Count: 2000

## 2011-02-13 NOTE — Telephone Encounter (Signed)
Chart opened in error

## 2011-02-18 ENCOUNTER — Encounter: Payer: Self-pay | Admitting: Internal Medicine

## 2011-02-18 ENCOUNTER — Ambulatory Visit (INDEPENDENT_AMBULATORY_CARE_PROVIDER_SITE_OTHER): Payer: 59 | Admitting: Internal Medicine

## 2011-02-18 VITALS — BP 102/82 | HR 96 | Temp 97.8°F | Ht 65.0 in | Wt 236.0 lb

## 2011-02-18 DIAGNOSIS — Z0000 Encounter for general adult medical examination without abnormal findings: Secondary | ICD-10-CM

## 2011-02-18 DIAGNOSIS — E785 Hyperlipidemia, unspecified: Secondary | ICD-10-CM

## 2011-02-18 DIAGNOSIS — Z23 Encounter for immunization: Secondary | ICD-10-CM

## 2011-02-18 NOTE — Progress Notes (Signed)
  Subjective:    Patient ID: Katherine [name], female    DOB: [birth year], 60 y.o.   MRN: [id number]  HPI  cpx  Peripheral neuropathy---followed by neurology  Past Medical History  Diagnosis Date  . Hyperlipidemia   . Chronic kidney disease     kidney stones  . Arthritis   . Hypertension   . Diverticulosis of colon   . Elevated LFTs     neg for hep b and c in 2006   Past Surgical History  Procedure Date  . Carpal tunnel release   . Abdominal hysterectomy   . Replacement total knee bilateral   . Rotator cuff repair   . Kidney stone surgery   . Trigger finger release 2010    right    reports that she has never smoked. She does not have any smokeless tobacco history on file. She reports that she does not drink alcohol. Her drug history not on file. family history includes Stroke in her mother. Allergies  Allergen Reactions  . Erythromycin     REACTION: chest pain     Review of Systems  patient denies chest pain, shortness of breath, orthopnea. Denies lower extremity edema, abdominal pain, change in appetite, change in bowel movements. Patient denies rashes, musculoskeletal complaints. No other specific complaints in a complete review of systems.      Objective:   Physical Exam  Well-developed well-nourished female in no acute distress. HEENT exam atraumatic, normocephalic, extraocular muscles are intact. Neck is supple. No jugular venous distention no thyromegaly. Chest clear to auscultation without increased work of breathing. Cardiac exam S1 and S2 are regular. Abdominal exam active bowel sounds, soft, nontender. Extremities no edema. Neurologic exam she is alert without any motor sensory deficits. Gait is normal.   Assessment & Plan:   Well visit---health maint UTD

## 2011-02-20 ENCOUNTER — Telehealth: Payer: Self-pay | Admitting: *Deleted

## 2011-02-20 NOTE — Telephone Encounter (Signed)
Pt states that Dr Cato Mulligan was going to send in a RX for her hot flashes and the pharmacy does not have anything on file.

## 2011-02-23 NOTE — Telephone Encounter (Signed)
i would like her to see her GYN

## 2011-02-23 NOTE — Telephone Encounter (Signed)
Notified pt. 

## 2011-02-25 LAB — BASIC METABOLIC PANEL
CO2: 30
Calcium: 10.1
Creatinine, Ser: 0.73
GFR calc Af Amer: >60

## 2011-02-25 LAB — POCT HEMOGLOBIN-HEMACUE: Hemoglobin: 13.4

## 2011-03-03 ENCOUNTER — Other Ambulatory Visit: Payer: 59

## 2011-03-10 ENCOUNTER — Encounter: Payer: 59 | Admitting: Internal Medicine

## 2011-05-21 ENCOUNTER — Other Ambulatory Visit (INDEPENDENT_AMBULATORY_CARE_PROVIDER_SITE_OTHER): Payer: 59

## 2011-05-21 DIAGNOSIS — E785 Hyperlipidemia, unspecified: Secondary | ICD-10-CM

## 2011-05-21 LAB — HEPATIC FUNCTION PANEL
ALT: 23 U/L (ref 0–35)
Bilirubin, Direct: 0 mg/dL (ref 0.0–0.3)
Total Bilirubin: 0.6 mg/dL (ref 0.3–1.2)

## 2011-05-21 LAB — LIPID PANEL: VLDL: 28.8 mg/dL (ref 0.0–40.0)

## 2011-07-24 ENCOUNTER — Telehealth: Payer: Self-pay | Admitting: Internal Medicine

## 2011-07-24 MED ORDER — HYDROCHLOROTHIAZIDE 25 MG PO TABS: 25.0000 mg | ORAL_TABLET | Freq: Every day | ORAL | Status: DC

## 2011-07-24 NOTE — Telephone Encounter (Signed)
Patient called stating she received a refill on her lisinopril but not her hctz. Please assist.

## 2011-07-24 NOTE — Telephone Encounter (Signed)
Closed in error.

## 2011-07-24 NOTE — Telephone Encounter (Signed)
rx sent in electronically 

## 2011-07-24 NOTE — Telephone Encounter (Signed)
Addended by: Alfred Levins D on: 07/24/2011 12:03 PM   Modules accepted: Orders

## 2011-07-30 ENCOUNTER — Ambulatory Visit (INDEPENDENT_AMBULATORY_CARE_PROVIDER_SITE_OTHER): Payer: 59 | Admitting: Internal Medicine

## 2011-07-30 ENCOUNTER — Encounter: Payer: Self-pay | Admitting: Internal Medicine

## 2011-07-30 VITALS — BP 116/82 | HR 100 | Temp 97.9°F | Wt 232.0 lb

## 2011-07-30 DIAGNOSIS — J069 Acute upper respiratory infection, unspecified: Secondary | ICD-10-CM

## 2011-07-30 DIAGNOSIS — R079 Chest pain, unspecified: Secondary | ICD-10-CM

## 2011-07-30 MED ORDER — GUAIFENESIN ER 600 MG PO TB12: 1200.0000 mg | ORAL_TABLET | Freq: Two times a day (BID) | ORAL | Status: AC

## 2011-07-30 MED ORDER — BENZOCAINE 10 % MT GEL: OROMUCOSAL | Status: AC | PRN

## 2011-07-30 NOTE — Progress Notes (Signed)
Subjective:    Patient ID: Katherine [name], female    DOB: July 05, 1950, 61 y.o.   MRN: [id number]  HPI  5 day hx of uri symptoms: sinus congestion, sore right ear, and chest congestion/tightness when she takes a deep breath. No SOB, wheezing. No LE edema  Past Medical History  Diagnosis Date  . Hyperlipidemia   . Chronic kidney disease     kidney stones  . Arthritis   . Hypertension   . Diverticulosis of colon   . Elevated LFTs     neg for hep b and c in 2006    History   Social History  . Marital Status: Single    Spouse Name: N/A    Number of Children: N/A  . Years of Education: N/A   Occupational History  . Not on file.   Social History Main Topics  . Smoking status: Never Smoker   . Smokeless tobacco: Not on file  . Alcohol Use: No  . Drug Use:   . Sexually Active:    Other Topics Concern  . Not on file   Social History Narrative  . No narrative on file    Past Surgical History  Procedure Date  . Carpal tunnel release   . Abdominal hysterectomy   . Replacement total knee bilateral   . Rotator cuff repair   . Kidney stone surgery   . Trigger finger release 2010    right    Family History  Problem Relation Age of Onset  . Stroke Mother     Allergies  Allergen Reactions  . Erythromycin     REACTION: chest pain    Current Outpatient Prescriptions on File Prior to Visit  Medication Sig Dispense Refill  . cyanocobalamin 1000 MCG tablet Take 100 mcg by mouth daily.        . cycloSPORINE (RESTASIS) 0.05 % ophthalmic emulsion Place 1 drop into both eyes 2 (two) times daily.        . hydrochlorothiazide (HYDRODIURIL) 25 MG tablet Take 1 tablet (25 mg total) by mouth daily.  90 tablet  3  . ibuprofen (ADVIL,MOTRIN) 200 MG tablet Take 200 mg by mouth every 6 (six) hours as needed.        Marland Kitchen lisinopril (PRINIVIL,ZESTRIL) 10 MG tablet Take 1 tablet (10 mg total) by mouth daily.  90 tablet  3  . Multiple Vitamin (MULTIVITAMIN) tablet Take 1 tablet by  mouth daily.        . nortriptyline (PAMELOR) 10 MG capsule Take 10 mg by mouth at bedtime.        . pyridOXINE (VITAMIN B-6) 100 MG tablet Take 100 mg by mouth daily.        . traMADol (ULTRAM) 50 MG tablet Take 50 mg by mouth every 6 (six) hours as needed.           patient denies chest pain, shortness of breath, orthopnea. Denies lower extremity edema, abdominal pain, change in appetite, change in bowel movements. Patient denies rashes, musculoskeletal complaints. No other specific complaints in a complete review of systems.   BP 116/82  Pulse 100  Temp(Src) 97.9 F (36.6 C) (Oral)  Wt 232 lb (105.235 kg)  SpO2 99%  Well-developed well-nourished female in no acute distress. HEENT exam atraumatic, (aphthous ulcer on normocephalic, extraocular muscles are intact. Neck is supple. No jugular venous distention no thyromegaly. Chest clear to auscultation without increased work of breathing. Cardiac exam S1 and S2 are regular. Abdominal exam: obese, active bowel sounds,  soft, nontender. Extremities no edema. Neurologic exam she is alert without any motor sensory deficits. Gait is normal.   A/P: URI- ok to use otc mucinex.  Cerumen impaction right ear---recommended gentle irrigation at home.  She has some chest tightness-likely related to URI. Will check EKG  Review of Systems     Objective:   Physical Exam

## 2011-09-09 ENCOUNTER — Encounter: Payer: Self-pay | Admitting: Gastroenterology

## 2011-09-21 ENCOUNTER — Encounter: Payer: Self-pay | Admitting: Gastroenterology

## 2011-10-06 ENCOUNTER — Encounter: Payer: Self-pay | Admitting: Obstetrics and Gynecology

## 2011-11-04 ENCOUNTER — Ambulatory Visit: Payer: Self-pay | Admitting: Obstetrics and Gynecology

## 2011-11-06 ENCOUNTER — Encounter: Payer: Self-pay | Admitting: Obstetrics and Gynecology

## 2011-11-10 ENCOUNTER — Ambulatory Visit (AMBULATORY_SURGERY_CENTER): Payer: 59 | Admitting: *Deleted

## 2011-11-10 ENCOUNTER — Ambulatory Visit (INDEPENDENT_AMBULATORY_CARE_PROVIDER_SITE_OTHER): Payer: 59 | Admitting: Obstetrics and Gynecology

## 2011-11-10 ENCOUNTER — Encounter: Payer: Self-pay | Admitting: Gastroenterology

## 2011-11-10 ENCOUNTER — Encounter: Payer: Self-pay | Admitting: Obstetrics and Gynecology

## 2011-11-10 VITALS — BP 112/66 | Ht 65.25 in | Wt 236.0 lb

## 2011-11-10 VITALS — Ht 65.0 in | Wt 235.2 lb

## 2011-11-10 DIAGNOSIS — Z1211 Encounter for screening for malignant neoplasm of colon: Secondary | ICD-10-CM

## 2011-11-10 DIAGNOSIS — N951 Menopausal and female climacteric states: Secondary | ICD-10-CM

## 2011-11-10 MED ORDER — MOVIPREP 100 G PO SOLR: 1.0000 | Freq: Once | ORAL | Status: DC

## 2011-11-10 MED ORDER — ESTRADIOL 1 MG PO TABS: 1.0000 mg | ORAL_TABLET | Freq: Every day | ORAL | Status: DC

## 2011-11-10 NOTE — Progress Notes (Signed)
Pt states,"Estradiol 1mg  not helping with hot flashes or night sweats. Pt started regimen in 04/2011.  Last Pap: 2001 WNL: Yes per pt Regular Periods:no Contraception: hyst  Monthly Breast exam:yes Tetanus<91yrs:yes Nl.Bladder Function:yes Daily BMs:yes Healthy Diet:yes Calcium:no Mammogram:yes  Date of Mammogram: 09/2011-wnl per pt @ [facility] Exercise:yes Have often Exercise: water aerobics 4 days weekly Seatbelt: yes Abuse at home: no Stressful work:no Sigmoid-colonoscopy: >62yrs ago-wnl per pt. Colposcopy sched next week with  GI Bone Density: Yes 11/04/10-wnl @ [facility] PCP: Dr. Cato Mulligan Change in PMH: 2 surgeries on left hand(06/2011) and 1 surgery on right wrist (10/2011) Change in FMH:no change BMI-40 Subjective:    Katherine [name] is a 61 y.o. female G1P1 who presents for annual exam.  She c/o hot flashes.  She is taking 0.5 mg estradiol daily, though her script is written for 1 mg daily. The following portions of the patient's history were reviewed and updated as appropriate: allergies, current medications, past family history, past medical history, past social history, past surgical history and problem list.  Review of Systems Pertinent items are noted in HPI. Gastrointestinal:No change in bowel habits, no abdominal pain, no rectal bleeding Genitourinary:negative for dysuria, frequency, hematuria, nocturia and urinary incontinence    Objective:     BP 112/66  Ht 5' 5.25" (1.657 m)  Wt 236 lb (107.049 kg)  BMI 38.97 kg/m2  Weight:  Wt Readings from Last 1 Encounters:  11/10/11 235 lb 3.2 oz (106.686 kg)     BMI: Body mass index is 38.97 kg/(m^2). General Appearance: Alert, appropriate appearance for age. No acute distress HEENT: Grossly normal Neck / Thyroid: Supple, no masses, nodes or enlargement Lungs: clear to auscultation bilaterally Back: No CVA tenderness Breast Exam: No masses or nodes.No dimpling, nipple retraction or discharge. Cardiovascular: Regular  rate and rhythm. S1, S2, no murmur Gastrointestinal: Soft, non-tender, no masses or organomegaly Pelvic Exam: External genitalia: normal general appearance Vaginal: atrophic mucosa and vaginal vault, well healed Cervix: removed surgically Adnexa: non palpable Uterus: removed surgically Rectovaginal: normal rectal, no masses Lymphatic Exam: Non-palpable nodes in neck, clavicular, axillary, or inguinal regions Skin: no rash or abnormalities Neurologic: Normal gait and speech, no tremor  Psychiatric: Alert and oriented, appropriate affect.    Urinalysis:Not done      Assessment:    Hormone replacement therapy Menopause    Plan:    All questions answered. Discussed healthy lifestyle modifications. Corrected intended estradiol dose to 1 mg daily   Follow-up:  for annual exam or prn, no improvement in hot flashes

## 2011-11-11 ENCOUNTER — Other Ambulatory Visit: Payer: Self-pay | Admitting: Obstetrics and Gynecology

## 2011-11-11 NOTE — Telephone Encounter (Signed)
Tc to pt per telephone call. Rx for Estradiol corrected to 2mg  1/2 tablet daily and called into pharm on file. Pt voices understanding.

## 2011-11-24 ENCOUNTER — Encounter: Payer: Self-pay | Admitting: Gastroenterology

## 2011-11-24 ENCOUNTER — Ambulatory Visit (AMBULATORY_SURGERY_CENTER): Payer: 59 | Admitting: Gastroenterology

## 2011-11-24 VITALS — BP 113/72 | HR 101 | Temp 97.9°F | Resp 14 | Ht 65.0 in | Wt 235.0 lb

## 2011-11-24 DIAGNOSIS — Z1211 Encounter for screening for malignant neoplasm of colon: Secondary | ICD-10-CM

## 2011-11-24 HISTORY — PX: COLONOSCOPY WITH PROPOFOL: SHX5780

## 2011-11-24 LAB — HM COLONOSCOPY

## 2011-11-24 MED ORDER — SODIUM CHLORIDE 0.9 % IV SOLN: 500.0000 mL | INTRAVENOUS | Status: DC

## 2011-11-24 NOTE — Op Note (Signed)
[facility] [address] [place], [place]  [address]  COLONOSCOPY PROCEDURE REPORT  PATIENT:  Katherine [name], Katherine [name]  MR#:  [id number] BIRTHDATE:  1950-07-31, 60 yrs. old  GENDER:  female ENDOSCOPIST:  Judie Petit T. Russella Dar, MD, [facility]  PROCEDURE DATE:  11/24/2011 PROCEDURE:  Colonoscopy 19147 ASA CLASS:  Class II INDICATIONS:  1) Routine Risk Screening MEDICATIONS:   MAC sedation, administered by CRNA, propofol (Diprivan) 140 mg IV DESCRIPTION OF PROCEDURE:   After the risks benefits and alternatives of the procedure were thoroughly explained, informed consent was obtained.  Digital rectal exam was performed and revealed no abnormalities.   The LB CF-[id number] endoscope was introduced through the anus and advanced to the cecum, which was identified by both the appendix and ileocecal valve, without limitations.  The quality of the prep was good, using MoviPrep. The instrument was then slowly withdrawn as the colon was fully examined. <<PROCEDUREIMAGES>> FINDINGS:  Mild diverticulosis was found in the sigmoid colon. Otherwise normal colonoscopy without other polyps, masses, vascular ectasias, or inflammatory changes.  Attempted at retroflexed views in the rectum were not tolerated by the patient, small internal hemorrhoids  The time to cecum =  4  minutes. The scope was then withdrawn (time =  9  min) from the patient and the procedure completed.  COMPLICATIONS:  None  ENDOSCOPIC IMPRESSION: 1) Mild diverticulosis in the sigmoid colon 2) Small internal hemorrhoids  RECOMMENDATIONS: 1) High fiber diet with liberal fluid intake. 2) Continue current colorectal screening for "routine risk" patients with a repeat colonoscopy in 10 years.  Venita Lick. Russella Dar, MD, [facility]  n. eSIGNED:   Venita Lick. Shreshta Medley at 11/24/2011 10:52 AM  Jonette Eva, [phone]

## 2011-11-24 NOTE — Progress Notes (Signed)
Patient did not experience any of the following events: a burn prior to discharge; a fall within the facility; wrong site/side/patient/procedure/implant event; or a hospital transfer or hospital admission upon discharge from the facility. (G8907) Patient did not have preoperative order for IV antibiotic SSI prophylaxis. (G8918)  

## 2011-11-24 NOTE — Patient Instructions (Addendum)

## 2011-11-25 ENCOUNTER — Telehealth: Payer: Self-pay | Admitting: *Deleted

## 2011-11-25 NOTE — Telephone Encounter (Signed)
  Follow up Call-  Call back number 11/24/2011  Post procedure Call Back phone  # [phone]  Permission to leave phone message Yes     Patient questions:  Do you have a fever, pain , or abdominal swelling? no Pain Score  0 *  Have you tolerated food without any problems? yes  Have you been able to return to your normal activities? yes  Do you have any questions about your discharge instructions: Diet   no Medications  no Follow up visit  no  Do you have questions or concerns about your Care? no  Actions: * If pain score is 4 or above: No action needed, pain <4.

## 2011-12-07 ENCOUNTER — Encounter: Payer: Self-pay | Admitting: Obstetrics and Gynecology

## 2012-02-16 ENCOUNTER — Other Ambulatory Visit: Payer: Self-pay | Admitting: Internal Medicine

## 2012-03-23 ENCOUNTER — Other Ambulatory Visit (INDEPENDENT_AMBULATORY_CARE_PROVIDER_SITE_OTHER): Payer: 59

## 2012-03-23 DIAGNOSIS — Z0000 Encounter for general adult medical examination without abnormal findings: Secondary | ICD-10-CM

## 2012-03-23 LAB — CBC WITH DIFFERENTIAL/PLATELET
Basophils Relative: 0.5 % (ref 0.0–3.0)
Eosinophils Relative: 3 % (ref 0.0–5.0)
HCT: 38.8 % (ref 36.0–46.0)
Hemoglobin: 12.9 g/dL (ref 12.0–15.0)
Lymphs Abs: 1.6 10*3/uL (ref 0.7–4.0)
Monocytes Relative: 15.5 % — ABNORMAL HIGH (ref 3.0–12.0)
Neutro Abs: 0.9 10*3/uL — ABNORMAL LOW (ref 1.4–7.7)
RDW: 13.9 % (ref 11.5–14.6)

## 2012-03-23 LAB — BASIC METABOLIC PANEL
GFR: 94.55 mL/min (ref >60.00)
Glucose, Bld: 105 mg/dL — ABNORMAL HIGH (ref 70–99)
Potassium: 3.7 mEq/L (ref 3.5–5.1)
Sodium: 137 mEq/L (ref 135–145)

## 2012-03-23 LAB — LIPID PANEL
LDL Cholesterol: 103 mg/dL — ABNORMAL HIGH (ref 0–99)
Total CHOL/HDL Ratio: 4

## 2012-03-23 LAB — HEPATIC FUNCTION PANEL
ALT: 28 U/L (ref 0–35)
AST: 56 U/L — ABNORMAL HIGH (ref 0–37)
Albumin: 3.7 g/dL (ref 3.5–5.2)
Total Protein: 6.8 g/dL (ref 6.0–8.3)

## 2012-03-23 LAB — POCT URINALYSIS DIPSTICK
Blood, UA: NEGATIVE
Nitrite, UA: NEGATIVE
Urobilinogen, UA: 4
pH, UA: 7.5

## 2012-03-23 LAB — TSH: TSH: 0.84 u[IU]/mL (ref 0.35–5.50)

## 2012-03-30 ENCOUNTER — Encounter: Payer: 59 | Admitting: Internal Medicine

## 2012-03-31 ENCOUNTER — Ambulatory Visit (INDEPENDENT_AMBULATORY_CARE_PROVIDER_SITE_OTHER): Payer: 59 | Admitting: Internal Medicine

## 2012-03-31 ENCOUNTER — Encounter: Payer: Self-pay | Admitting: Internal Medicine

## 2012-03-31 VITALS — BP 122/82 | HR 112 | Temp 98.2°F | Wt 238.0 lb

## 2012-03-31 DIAGNOSIS — Z0000 Encounter for general adult medical examination without abnormal findings: Secondary | ICD-10-CM

## 2012-03-31 DIAGNOSIS — G622 Polyneuropathy due to other toxic agents: Secondary | ICD-10-CM

## 2012-03-31 DIAGNOSIS — E669 Obesity, unspecified: Secondary | ICD-10-CM

## 2012-03-31 NOTE — Progress Notes (Signed)
Patient ID: Katherine [name], female   DOB: 12/11/1950, 61 y.o.   MRN: [id number] cpx  Past Medical History  Diagnosis Date  . Hyperlipidemia   . Chronic kidney disease     kidney stones  . Arthritis   . Hypertension   . Diverticulosis of colon   . Elevated LFTs     neg for hep b and c in 2006  . Kidney infection   . History of mumps   . Yeast infection   . Bacterial infection   . Trichomonas   . Pelvic pain in female 10/27/10    History   Social History  . Marital Status: Single    Spouse Name: N/A    Number of Children: N/A  . Years of Education: N/A   Occupational History  . Not on file.   Social History Main Topics  . Smoking status: Never Smoker   . Smokeless tobacco: Never Used  . Alcohol Use: No  . Drug Use: No  . Sexually Active: No   Other Topics Concern  . Not on file   Social History Narrative  . No narrative on file    Past Surgical History  Procedure Date  . Carpal tunnel release   . Abdominal hysterectomy   . Replacement total knee bilateral   . Rotator cuff repair   . Kidney stone surgery   . Trigger finger release 2010    right  . Knee surgery   . Vesicovaginal fistula closure w/ tah   . Dilation and curettage of uterus   . Hand surgery 06/2011    2 surgeries on left hand  . Wrist surgery 10/2011    right wrist    Family History  Problem Relation Age of Onset  . Stroke Mother   . Colon cancer Neg Hx   . Esophageal cancer Neg Hx   . Rectal cancer Neg Hx   . Stomach cancer Neg Hx     Allergies  Allergen Reactions  . Erythromycin     REACTION: chest pain    Current Outpatient Prescriptions on File Prior to Visit  Medication Sig Dispense Refill  . cyanocobalamin 1000 MCG tablet Take 100 mcg by mouth daily.       . cycloSPORINE (RESTASIS) 0.05 % ophthalmic emulsion Place 1 drop into both eyes 2 (two) times daily.        Marland Kitchen estradiol (ESTRACE) 1 MG tablet Take 1 tablet (1 mg total) by mouth daily. Pt to take 1/2 tablet daily  30  tablet  12  . hydrochlorothiazide (HYDRODIURIL) 25 MG tablet Take 1 tablet (25 mg total) by mouth daily.  90 tablet  3  . ibuprofen (ADVIL,MOTRIN) 200 MG tablet Take 200 mg by mouth every 6 (six) hours as needed.        Marland Kitchen lisinopril (PRINIVIL,ZESTRIL) 10 MG tablet TAKE ONE TABLET BY MOUTH EVERY DAY  90 tablet  0  . Multiple Vitamin (MULTIVITAMIN) tablet Take 1 tablet by mouth daily.        . nortriptyline (PAMELOR) 10 MG capsule Take 25 mg by mouth 3 (three) times daily.       Marland Kitchen oxyCODONE-acetaminophen (PERCOCET) 5-325 MG per tablet       . pregabalin (LYRICA) 100 MG capsule Take 100 mg by mouth 2 (two) times daily.       . traMADol (ULTRAM) 50 MG tablet Take 50 mg by mouth every 6 (six) hours as needed.        . [DISCONTINUED] gabapentin (  NEURONTIN) 300 MG capsule Take 300 mg by mouth 3 (three) times daily.           patient denies chest pain, shortness of breath, orthopnea. Denies lower extremity edema, abdominal pain, change in appetite, change in bowel movements. Patient denies rashes, musculoskeletal complaints. No other specific complaints in a complete review of systems.   BP 122/82  Pulse 112  Temp 98.2 F (36.8 C) (Oral)  Wt 238 lb (107.956 kg)  Well-developed well-nourished female in no acute distress. HEENT exam atraumatic, normocephalic, extraocular muscles are intact. Neck is supple. No jugular venous distention no thyromegaly. Chest clear to auscultation without increased work of breathing. Cardiac exam S1 and S2 are regular. Abdominal exam active bowel sounds, soft, nontender. Extremities no edema. Neurologic exam she is alert without any motor sensory deficits. Gait is normal.  A/P- well visit Health maint UTD  Flu immunization today (given)  She understands the need to lose weight- she does exercise but admits to eating a poor diet.

## 2012-05-09 ENCOUNTER — Other Ambulatory Visit: Payer: Self-pay | Admitting: Internal Medicine

## 2012-08-08 ENCOUNTER — Ambulatory Visit (INDEPENDENT_AMBULATORY_CARE_PROVIDER_SITE_OTHER): Payer: 59 | Admitting: Internal Medicine

## 2012-08-08 ENCOUNTER — Other Ambulatory Visit: Payer: Self-pay | Admitting: Neurology

## 2012-08-08 ENCOUNTER — Other Ambulatory Visit: Payer: Self-pay | Admitting: Internal Medicine

## 2012-08-08 ENCOUNTER — Encounter: Payer: Self-pay | Admitting: Internal Medicine

## 2012-08-08 VITALS — BP 120/82 | HR 101 | Temp 98.1°F | Resp 18 | Wt 244.0 lb

## 2012-08-08 DIAGNOSIS — I10 Essential (primary) hypertension: Secondary | ICD-10-CM

## 2012-08-08 DIAGNOSIS — E785 Hyperlipidemia, unspecified: Secondary | ICD-10-CM

## 2012-08-08 DIAGNOSIS — G619 Inflammatory polyneuropathy, unspecified: Secondary | ICD-10-CM

## 2012-08-08 DIAGNOSIS — R7309 Other abnormal glucose: Secondary | ICD-10-CM

## 2012-08-08 DIAGNOSIS — R7302 Impaired glucose tolerance (oral): Secondary | ICD-10-CM

## 2012-08-08 LAB — HEMOGLOBIN A1C: Hgb A1c MFr Bld: 5.8 % (ref 4.6–6.5)

## 2012-08-08 MED ORDER — LISINOPRIL 10 MG PO TABS: ORAL_TABLET | ORAL | Status: DC

## 2012-08-08 MED ORDER — HYDROCHLOROTHIAZIDE 25 MG PO TABS: 25.0000 mg | ORAL_TABLET | Freq: Every day | ORAL | Status: DC

## 2012-08-08 NOTE — Progress Notes (Signed)
Subjective:    Patient ID: Katherine [name], female    DOB: [birth year], 62 y.o.   MRN: [id number]  HPI  62 year old patient who has a history of significant peripheral neuropathy. She is being followed by neurology. Due  to poor wound healing the patient is concerned about possible diabetes.  Review of her medical record does reveal some impaired glucose tolerance through the years intermittently. 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 She is requesting a handicap plaque due 2 painful feet and difficulty with ambulation. She has treated hypertension and dyslipidemia. She does require medication refills  Past Medical History  Diagnosis Date  . Hyperlipidemia   . Chronic kidney disease     kidney stones  . Arthritis   . Hypertension   . Diverticulosis of colon   . Elevated LFTs     neg for hep b and c in 2006  . Kidney infection   . History of mumps   . Yeast infection   . Bacterial infection   . Trichomonas   . Pelvic pain in female 10/27/10    History   Social History  . Marital Status: Single    Spouse Name: N/A    Number of Children: N/A  . Years of Education: N/A   Occupational History  . Not on file.   Social History Main Topics  . Smoking status: Never Smoker   . Smokeless tobacco: Never Used  . Alcohol Use: No  . Drug Use: No  . Sexually Active: No   Other Topics Concern  . Not on file   Social History Narrative  . No narrative on file    Past Surgical History  Procedure Laterality Date  . Carpal tunnel release    . Abdominal hysterectomy    . Replacement total knee bilateral    . Rotator cuff repair    . Kidney stone surgery    . Trigger finger release  2010    right  . Knee surgery    . Vesicovaginal fistula closure w/ tah    . Dilation and curettage of uterus    . Hand surgery  06/2011    2 surgeries on left hand  . Wrist surgery  10/2011    right wrist    Family History  Problem Relation Age of Onset  . Stroke Mother   . Cancer Mother 63    bone  cancer  . Colon cancer Neg Hx   . Esophageal cancer Neg Hx   . Rectal cancer Neg Hx   . Stomach cancer Neg Hx   . Diabetes Sister     Allergies  Allergen Reactions  . Erythromycin     REACTION: chest pain    Current Outpatient Prescriptions on File Prior to Visit  Medication Sig Dispense Refill  . cyanocobalamin 1000 MCG tablet Take 100 mcg by mouth daily.       . cycloSPORINE (RESTASIS) 0.05 % ophthalmic emulsion Place 1 drop into both eyes 2 (two) times daily.        Marland Kitchen estradiol (ESTRACE) 1 MG tablet Take 1 tablet (1 mg total) by mouth daily. Pt to take 1/2 tablet daily  30 tablet  12  . ibuprofen (ADVIL,MOTRIN) 200 MG tablet Take 200 mg by mouth every 6 (six) hours as needed.        Marland Kitchen Ketamine HCl POWD Three times a day.      Marland Kitchen l-methylfolate-B6-B12 (METANX) 3-35-2 MG TABS Take 2 tablets by mouth daily.      Marland Kitchen  Multiple Vitamin (MULTIVITAMIN) tablet Take 1 tablet by mouth daily.        . nortriptyline (PAMELOR) 10 MG capsule Take 25 mg by mouth 3 (three) times daily.       Marland Kitchen oxyCODONE-acetaminophen (PERCOCET) 5-325 MG per tablet       . pregabalin (LYRICA) 100 MG capsule Take 100 mg by mouth 2 (two) times daily.       . traMADol (ULTRAM) 50 MG tablet Take 50 mg by mouth every 6 (six) hours as needed.        . [DISCONTINUED] gabapentin (NEURONTIN) 300 MG capsule Take 300 mg by mouth 3 (three) times daily.         No current facility-administered medications on file prior to visit.    BP 120/82  Pulse 101  Temp(Src) 98.1 F (36.7 C) (Oral)  Resp 18  Wt 244 lb (110.678 kg)  BMI 40.6 kg/m2  SpO2 98%       Review of Systems  Constitutional: Negative.   HENT: Negative for hearing loss, congestion, sore throat, rhinorrhea, dental problem, sinus pressure and tinnitus.   Eyes: Negative for pain, discharge and visual disturbance.  Respiratory: Negative for cough and shortness of breath.   Cardiovascular: Negative for chest pain, palpitations and leg swelling.   Gastrointestinal: Negative for nausea, vomiting, abdominal pain, diarrhea, constipation, blood in stool and abdominal distention.  Genitourinary: Negative for dysuria, urgency, frequency, hematuria, flank pain, vaginal bleeding, vaginal discharge, difficulty urinating, vaginal pain and pelvic pain.  Musculoskeletal: Positive for gait problem. Negative for joint swelling and arthralgias.  Skin: Negative for rash.  Neurological: Positive for numbness. Negative for dizziness, syncope, speech difficulty, weakness and headaches.  Hematological: Negative for adenopathy.  Psychiatric/Behavioral: Negative for behavioral problems, dysphoric mood and agitation. The patient is not nervous/anxious.        Objective:   Physical Exam  Constitutional: She is oriented to person, place, and time. She appears well-developed and well-nourished.  Blood pressure 120/80 Weight 244 Pulse rate 90-100  HENT:  Head: Normocephalic.  Right Ear: External ear normal.  Left Ear: External ear normal.  Mouth/Throat: Oropharynx is clear and moist.  Eyes: Conjunctivae and EOM are normal. Pupils are equal, round, and reactive to light.  Neck: Normal range of motion. Neck supple. No thyromegaly present.  Cardiovascular: Normal rate, regular rhythm, normal heart sounds and intact distal pulses.   Pulmonary/Chest: Effort normal and breath sounds normal.  Abdominal: Soft. Bowel sounds are normal. She exhibits no mass. There is no tenderness.  Musculoskeletal: Normal range of motion.  Lymphadenopathy:    She has no cervical adenopathy.  Neurological: She is alert and oriented to person, place, and time.  Skin: Skin is warm and dry. No rash noted.  Psychiatric: She has a normal mood and affect. Her behavior is normal.          Assessment & Plan:   Peripheral neuropathy Possible impaired glucose tolerance Hypertension well controlled Dyslipidemia Exogenous obesity  Medications refilled We'll check a hemoglobin  A1c Weight loss encouraged

## 2012-08-08 NOTE — Patient Instructions (Signed)
Limit your sodium (Salt) intake  Please check your blood pressure on a regular basis.  If it is consistently greater than 150/90, please make an office appointment.  You need to lose weight.  Consider a lower calorie diet and regular exercise. 

## 2012-08-11 ENCOUNTER — Telehealth: Payer: Self-pay | Admitting: *Deleted

## 2012-08-11 NOTE — Telephone Encounter (Signed)
Patient calling for an appointment.

## 2012-08-12 ENCOUNTER — Other Ambulatory Visit: Payer: Self-pay

## 2012-08-12 MED ORDER — PREGABALIN 100 MG PO CAPS: 100.0000 mg | ORAL_CAPSULE | Freq: Two times a day (BID) | ORAL | Status: DC

## 2012-08-12 NOTE — Telephone Encounter (Signed)
Patient called clinic asking for a refill on Lyrica.

## 2012-08-18 ENCOUNTER — Telehealth: Payer: Self-pay | Admitting: Internal Medicine

## 2012-08-18 NOTE — Telephone Encounter (Signed)
Spoke to pt told her Hemoglobin A1c was 5.8 very good no diabetes. Pt verbalized understanding.

## 2012-08-18 NOTE — Telephone Encounter (Signed)
Patient called stating that she would like a call back with lab results. Please assist.

## 2012-08-30 ENCOUNTER — Other Ambulatory Visit: Payer: Self-pay | Admitting: Neurology

## 2012-10-20 ENCOUNTER — Telehealth: Payer: Self-pay | Admitting: Nurse Practitioner

## 2012-10-21 NOTE — Telephone Encounter (Signed)
I called pt and she had a fall one month ago, but also some shaking in arms and legs.  Has appt in 12/2012 with Enid Skeens, NP.  She got letter about her neuropathy cream that  insurance will no longer pay for one of the ingredients in the compound.  Appeal?

## 2012-10-21 NOTE — Telephone Encounter (Signed)
I called the pharmacy and spoke with [facility].  She said the patient called them and requested a refill on 06/09.  Kathee Polite took Credit Card info from the patient yesterday and she paid her usual $50 co-pay.  Meds were placed in the mail yesterday, and patient should have them early next week.  She does not see any issues processing the Rx and it was covered under ins for the same co-pay amount patient has been paying.  I tried to call the patient, got no answer.  Will call again Monday.

## 2012-10-24 ENCOUNTER — Other Ambulatory Visit: Payer: Self-pay | Admitting: Neurology

## 2012-10-24 ENCOUNTER — Ambulatory Visit: Payer: Self-pay | Admitting: Nurse Practitioner

## 2012-10-26 NOTE — Telephone Encounter (Signed)
I called and spoke with patient.  She has the Rx for cream, but will fax [place] a copy of the letter she received from her insurance about formulary change.  I advised her we would have to look at the letter to see if a prior [place] is needed or if we need to change an ingredient in the compounded drug.  She verbalized understanding.  Pending fax from patient to proceed.

## 2012-11-09 ENCOUNTER — Encounter: Payer: Self-pay | Admitting: Internal Medicine

## 2012-11-14 ENCOUNTER — Other Ambulatory Visit: Payer: Self-pay

## 2012-11-14 NOTE — Telephone Encounter (Signed)
Yes ok without the baclofen in the cream. Renew Lyrica

## 2012-11-14 NOTE — Telephone Encounter (Signed)
I spoke with patient.  She still has some cream and Lyrica, she is not currently out.  In fact, she should have 1 refill remaining on it.  She said she called her insurance and they will no longer cover Baclofen in the compund.  She would like to know if we can prescribe this med again without that component.Leeta, okay to prescribe new compound cream?  Thank you.

## 2012-11-14 NOTE — Telephone Encounter (Signed)
Pt is out of cream and her Lyrica. She needs someone to call her about both of these. Pt states she dropped off paper work last Monday from [employer] co. Pt would like for someone to call her back at [phone] concerning these matters. Thanks

## 2012-11-15 MED ORDER — PREGABALIN 100 MG PO CAPS: 100.0000 mg | ORAL_CAPSULE | Freq: Two times a day (BID) | ORAL | Status: DC

## 2012-11-18 ENCOUNTER — Telehealth: Payer: Self-pay | Admitting: Neurology

## 2012-11-18 NOTE — Telephone Encounter (Signed)
Lyrica was faxed to [facility] on 11/14/2012.  We have confirmation they received the fax 11/14/12 at 5:00pm.  I just refaxed it again.  Conf time stamped 4:47pm.  I called the patient back.  She is aware Rx was sent twice.  She will follow up with Pharmacy on Monday.  I asked her if she needed samples, she said she still had some meds at this time.  Advised her to call us back if she decides she needs samples while waiting on mail order.

## 2012-12-29 ENCOUNTER — Encounter: Payer: Self-pay | Admitting: Nurse Practitioner

## 2012-12-29 ENCOUNTER — Ambulatory Visit (INDEPENDENT_AMBULATORY_CARE_PROVIDER_SITE_OTHER): Payer: 59 | Admitting: Nurse Practitioner

## 2012-12-29 VITALS — BP 115/73 | HR 101 | Ht 66.0 in | Wt 232.0 lb

## 2012-12-29 DIAGNOSIS — G619 Inflammatory polyneuropathy, unspecified: Secondary | ICD-10-CM

## 2012-12-29 DIAGNOSIS — G609 Hereditary and idiopathic neuropathy, unspecified: Secondary | ICD-10-CM

## 2012-12-29 MED ORDER — METANX 3-90.314-2-35 MG PO CAPS: 1.0000 | ORAL_CAPSULE | Freq: Two times a day (BID) | ORAL | Status: DC

## 2012-12-29 NOTE — Progress Notes (Signed)
Reason for visit  Follow up for peripheral neuropathy HPI: Katherine [name]  62 year old black female returns for followup. She was last seen 02/24/2012. 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 Exam demonstrated mild sensory loss in the feet, worse on the right, and diminished ankle jerks. Neuropathy labs were unremarkable, except for a slightly elevated HbA1c of 5.9. Advised to take a multivitamin. Gabapentin provided incomplete relief, and she had some difficulties with drowsiness. Nortriptyline was added at doses increasing to 50 mg q.h.s. She reports ongoing soreness of feet, not bad, but had 2 bouts of severe pain lasting 30-45 minutes, mostly in evenings; given tramadol.  12/29/12 Remains on Lyrica 100mg  BID and Nortriptyline 75mg  at night. . She continues to have occasional foot pain, She takes tramadol for pain. Transdermal cream was stopped due to insurance issues, she is back on that. She has stopped her Metanx insurance would not pay   She is doing water aerobics 3 to 4 times weekly. She continues to retain fluid around the ankles.   ROS:  Joint pain, numbness, restless legs   Medications Current Outpatient Prescriptions on File Prior to Visit  Medication Sig Dispense Refill  . cyanocobalamin 1000 MCG tablet Take 100 mcg by mouth daily.       . hydrochlorothiazide (HYDRODIURIL) 25 MG tablet Take 1 tablet (25 mg total) by mouth daily.  90 tablet  3  . ibuprofen (ADVIL,MOTRIN) 200 MG tablet Take 200 mg by mouth every 6 (six) hours as needed.        Marland Kitchen Ketamine HCl POWD Three times a day.      . lisinopril (PRINIVIL,ZESTRIL) 10 MG tablet TAKE ONE TABLET BY MOUTH EVERY DAY  90 tablet  3  . Multiple Vitamin (MULTIVITAMIN) tablet Take 1 tablet by mouth daily.        . nortriptyline (PAMELOR) 10 MG capsule Take 25 mg by mouth 3 (three) times daily.       . nortriptyline (PAMELOR) 25 MG capsule TAKE THREE  CAPSULES BY MOUTH AT BEDTIME  90 capsule  6  . oxyCODONE-acetaminophen (PERCOCET) 5-325 MG per tablet       . pregabalin (LYRICA) 100 MG capsule Take 1 capsule (100 mg total) by mouth 2 (two) times daily.  180 capsule  1  . traMADol (ULTRAM) 50 MG tablet TAKE ONE TO TWO TABLETS BY MOUTH EVERY 6 HOURS AS NEEDED FOR PAIN  60 tablet  3  . [DISCONTINUED] gabapentin (NEURONTIN) 300 MG capsule Take 300 mg by mouth 3 (three) times daily.         No current facility-administered medications on file prior to visit.    Allergies  Allergies  Allergen Reactions  . Erythromycin     REACTION: chest pain    Physical Exam General: well developed, obese female  seated, in no evident distress Skin:  Mild edema of both ankles Neurologic Exam Mental Status: Awake and fully alert. Oriented to place and time. Follows all commands. Speech and language normal.   Cranial Nerves:  Pupils equal, briskly reactive to light. Extraocular movements full without nystagmus. Visual fields full to confrontation. Hearing intact and symmetric to finger snap. Facial sensation intact. Face, tongue, palate move normally and symmetrically. Neck flexion and extension normal.  Motor: Normal bulk and tone. Normal strength in all tested extremity muscles.No focal weakness Sensory.: intact to touch and pinprick and vibratory and.position sense in both lower extremities.  Coordination: Rapid alternating movements normal in all extremities. Finger-to-nose and heel-to-shin performed accurately bilaterally. No dysmetria Gait and Station: Arises from chair without difficulty. Stance is normal. . Able to heel, toe and tandem walk without difficulty.  Reflexes: 1+ and symmetric upper extremities, 1+ patella, absent ankle jerks. Toes downgoing.     ASSESSMENT: Peripheral neuropathy, in fair control with Lyrica and nortriptyline and transdermal cream. Will restart Metanx     PLAN: Pt to continue Lyrica at current dose will refill Given  information  on  Metanx Continue Nortriptyline Continue transdermal cream  F/U 6 months  Nilda Riggs, GNP-BC APRN

## 2012-12-29 NOTE — Patient Instructions (Addendum)
Pt to continue Lyrica at current dose will refill Given information to Metanx Continue Nortriptyline F/U 6 months

## 2013-02-13 ENCOUNTER — Other Ambulatory Visit: Payer: Self-pay

## 2013-02-13 MED ORDER — TRAMADOL HCL 50 MG PO TABS: ORAL_TABLET | ORAL | Status: DC

## 2013-03-02 ENCOUNTER — Other Ambulatory Visit: Payer: Self-pay | Admitting: Neurology

## 2013-03-03 ENCOUNTER — Other Ambulatory Visit: Payer: Self-pay

## 2013-05-05 ENCOUNTER — Other Ambulatory Visit (INDEPENDENT_AMBULATORY_CARE_PROVIDER_SITE_OTHER): Payer: 59

## 2013-05-05 DIAGNOSIS — Z0000 Encounter for general adult medical examination without abnormal findings: Secondary | ICD-10-CM

## 2013-05-05 LAB — BASIC METABOLIC PANEL
BUN: 12 mg/dL (ref 6–23)
CO2: 29 mEq/L (ref 19–32)
Calcium: 11.6 mg/dL — ABNORMAL HIGH (ref 8.4–10.5)
Chloride: 101 mEq/L (ref 96–112)
Creatinine, Ser: 0.8 mg/dL (ref 0.4–1.2)
GFR: 100.03 mL/min (ref >60.00)
Glucose, Bld: 111 mg/dL — ABNORMAL HIGH (ref 70–99)
Potassium: 3.7 mEq/L (ref 3.5–5.1)
Sodium: 136 mEq/L (ref 135–145)

## 2013-05-05 LAB — CBC WITH DIFFERENTIAL/PLATELET
Basophils Absolute: 0 10*3/uL (ref 0.0–0.1)
Basophils Relative: 0.4 % (ref 0.0–3.0)
Eosinophils Absolute: 0.1 10*3/uL (ref 0.0–0.7)
Eosinophils Relative: 2.7 % (ref 0.0–5.0)
HCT: 39.5 % (ref 36.0–46.0)
Hemoglobin: 13.3 g/dL (ref 12.0–15.0)
Lymphocytes Relative: 46.5 % — ABNORMAL HIGH (ref 12.0–46.0)
Lymphs Abs: 2.2 10*3/uL (ref 0.7–4.0)
MCHC: 33.8 g/dL (ref 30.0–36.0)
MCV: 89.9 fl (ref 78.0–100.0)
Monocytes Absolute: 0.5 10*3/uL (ref 0.1–1.0)
Monocytes Relative: 11.1 % (ref 3.0–12.0)
Neutro Abs: 1.9 10*3/uL (ref 1.4–7.7)
Neutrophils Relative %: 39.3 % — ABNORMAL LOW (ref 43.0–77.0)
Platelets: 163 10*3/uL (ref 150.0–400.0)
RBC: 4.39 Mil/uL (ref 3.87–5.11)
RDW: 15 % — ABNORMAL HIGH (ref 11.5–14.6)
WBC: 4.8 10*3/uL (ref 4.5–10.5)

## 2013-05-05 LAB — HEPATIC FUNCTION PANEL
ALT: 24 U/L (ref 0–35)
AST: 47 U/L — ABNORMAL HIGH (ref 0–37)
Albumin: 3.8 g/dL (ref 3.5–5.2)
Total Bilirubin: 0.7 mg/dL (ref 0.3–1.2)
Total Protein: 6.7 g/dL (ref 6.0–8.3)

## 2013-05-05 LAB — POCT URINALYSIS DIPSTICK
Bilirubin, UA: NEGATIVE
Blood, UA: NEGATIVE
Glucose, UA: NEGATIVE
Nitrite, UA: NEGATIVE
Protein, UA: NEGATIVE
Spec Grav, UA: 1.02
Urobilinogen, UA: 0.2
pH, UA: 6.5

## 2013-05-05 LAB — LIPID PANEL
Cholesterol: 172 mg/dL (ref 0–200)
HDL: 46.7 mg/dL (ref >39.00)
Triglycerides: 170 mg/dL — ABNORMAL HIGH (ref 0.0–149.0)

## 2013-05-12 ENCOUNTER — Encounter: Payer: 59 | Admitting: Internal Medicine

## 2013-05-18 ENCOUNTER — Other Ambulatory Visit: Payer: 59

## 2013-05-19 ENCOUNTER — Other Ambulatory Visit (INDEPENDENT_AMBULATORY_CARE_PROVIDER_SITE_OTHER): Payer: 59

## 2013-05-19 DIAGNOSIS — D509 Iron deficiency anemia, unspecified: Secondary | ICD-10-CM

## 2013-05-19 LAB — BASIC METABOLIC PANEL
BUN: 9 mg/dL (ref 6–23)
CHLORIDE: 105 meq/L (ref 96–112)
CO2: 27 meq/L (ref 19–32)
CREATININE: 0.8 mg/dL (ref 0.4–1.2)
Calcium: 10.1 mg/dL (ref 8.4–10.5)
GFR: 98.5 mL/min (ref >60.00)
GLUCOSE: 101 mg/dL — AB (ref 70–99)
Potassium: 3.5 mEq/L (ref 3.5–5.1)
Sodium: 137 mEq/L (ref 135–145)

## 2013-05-20 LAB — CALCIUM, IONIZED: Calcium, Ion: 1.45 mmol/L — ABNORMAL HIGH (ref 1.12–1.32)

## 2013-05-22 LAB — PTH, INTACT AND CALCIUM
Calcium: 10.1 mg/dL (ref 8.4–10.5)
PTH: 147.6 pg/mL — ABNORMAL HIGH (ref 14.0–72.0)

## 2013-05-26 ENCOUNTER — Encounter: Payer: 59 | Admitting: Internal Medicine

## 2013-05-26 ENCOUNTER — Encounter: Payer: Self-pay | Admitting: Internal Medicine

## 2013-05-26 ENCOUNTER — Ambulatory Visit (INDEPENDENT_AMBULATORY_CARE_PROVIDER_SITE_OTHER): Payer: 59 | Admitting: Internal Medicine

## 2013-05-26 VITALS — BP 118/72 | HR 90 | Temp 97.9°F | Ht 65.0 in | Wt 229.0 lb

## 2013-05-26 DIAGNOSIS — Z0000 Encounter for general adult medical examination without abnormal findings: Secondary | ICD-10-CM

## 2013-05-26 DIAGNOSIS — E213 Hyperparathyroidism, unspecified: Secondary | ICD-10-CM

## 2013-05-26 NOTE — Progress Notes (Signed)
Pre visit review using our clinic review tool, if applicable. No additional management support is needed unless otherwise documented below in the visit note. 

## 2013-05-26 NOTE — Progress Notes (Signed)
CPX  Past Medical History  Diagnosis Date  . Hyperlipidemia   . Chronic kidney disease     kidney stones  . Arthritis   . Hypertension   . Diverticulosis of colon   . Elevated LFTs     neg for hep b and c in 2006  . Kidney infection   . History of mumps   . Yeast infection   . Bacterial infection   . Trichomonas   . Pelvic pain in female 10/27/10    History   Social History  . Marital Status: Single    Spouse Name: N/A    Number of Children: N/A  . Years of Education: N/A   Occupational History  . Not on file.   Social History Main Topics  . Smoking status: Never Smoker   . Smokeless tobacco: Never Used  . Alcohol Use: No  . Drug Use: No  . Sexual Activity: No   Other Topics Concern  . Not on file   Social History Narrative  . No narrative on file    Past Surgical History  Procedure Laterality Date  . Carpal tunnel release    . Abdominal hysterectomy    . Replacement total knee bilateral    . Rotator cuff repair    . Kidney stone surgery    . Trigger finger release  2010    right  . Knee surgery    . Vesicovaginal fistula closure w/ tah    . Dilation and curettage of uterus    . Hand surgery  06/2011    2 surgeries on left hand  . Wrist surgery  10/2011    right wrist    Family History  Problem Relation Age of Onset  . Stroke Mother   . Cancer Mother 69    bone cancer  . Colon cancer Neg Hx   . Esophageal cancer Neg Hx   . Rectal cancer Neg Hx   . Stomach cancer Neg Hx   . Diabetes Sister     Allergies  Allergen Reactions  . Erythromycin     REACTION: chest pain    Current Outpatient Prescriptions on File Prior to Visit  Medication Sig Dispense Refill  . cyanocobalamin 1000 MCG tablet Take 100 mcg by mouth daily.       Marland Kitchen estradiol (ESTRACE) 1 MG tablet Take 1 mg by mouth daily.      . hydrochlorothiazide (HYDRODIURIL) 25 MG tablet Take 1 tablet (25 mg total) by mouth daily.  90 tablet  3  . ibuprofen (ADVIL,MOTRIN) 200 MG tablet Take  200 mg by mouth every 6 (six) hours as needed.        Marland Kitchen Ketamine HCl POWD Three times a day.      Marland Kitchen L-Methylfolate-Algae-B12-B6 (METANX) 3-90.314-2-35 MG CAPS Take 1 capsule by mouth 2 (two) times daily.  180 capsule  1  . lisinopril (PRINIVIL,ZESTRIL) 10 MG tablet TAKE ONE TABLET BY MOUTH EVERY DAY  90 tablet  3  . Multiple Vitamin (MULTIVITAMIN) tablet Take 1 tablet by mouth daily.        . nortriptyline (PAMELOR) 25 MG capsule TAKE THREE CAPSULES BY MOUTH AT BEDTIME  90 capsule  3  . oxyCODONE-acetaminophen (PERCOCET) 5-325 MG per tablet Take 1 tablet by mouth daily as needed.       . pregabalin (LYRICA) 100 MG capsule Take 1 capsule (100 mg total) by mouth 2 (two) times daily.  180 capsule  1  . traMADol (ULTRAM) 50 MG tablet  Take one to two tablets by mouth every 6 hours if needed for pain  60 tablet  3  . [DISCONTINUED] gabapentin (NEURONTIN) 300 MG capsule Take 300 mg by mouth 3 (three) times daily.         No current facility-administered medications on file prior to visit.     patient denies chest pain, shortness of breath, orthopnea. Denies lower extremity edema, abdominal pain, change in appetite, change in bowel movements. Patient denies rashes, musculoskeletal complaints. No other specific complaints in a complete review of systems.   BP 118/72  Pulse 90  Temp(Src) 97.9 F (36.6 C) (Oral)  Ht 5\' 5"  (1.651 m)  Wt 229 lb (103.874 kg)  BMI 38.11 kg/m2  Well-developed well-nourished female in no acute distress. HEENT exam atraumatic, normocephalic, extraocular muscles are intact. Neck is supple. No jugular venous distention no thyromegaly. Chest clear to auscultation without increased work of breathing. Cardiac exam S1 and S2 are regular. Abdominal exam active bowel sounds, soft, nontender. Extremities no edema. Neurologic exam she is alert without any motor sensory deficits. Gait is normal.   Well Visit- health maint UTD Note hyperparathyroidism- best to get endocrine consult

## 2013-06-08 ENCOUNTER — Encounter: Payer: Self-pay | Admitting: Endocrinology

## 2013-06-08 ENCOUNTER — Ambulatory Visit (INDEPENDENT_AMBULATORY_CARE_PROVIDER_SITE_OTHER): Payer: 59 | Admitting: Endocrinology

## 2013-06-08 ENCOUNTER — Other Ambulatory Visit: Payer: Self-pay

## 2013-06-08 VITALS — BP 118/72 | HR 100 | Temp 98.3°F | Resp 16 | Ht 65.25 in | Wt 232.6 lb

## 2013-06-08 DIAGNOSIS — N200 Calculus of kidney: Secondary | ICD-10-CM

## 2013-06-08 DIAGNOSIS — E213 Hyperparathyroidism, unspecified: Secondary | ICD-10-CM

## 2013-06-08 DIAGNOSIS — I10 Essential (primary) hypertension: Secondary | ICD-10-CM

## 2013-06-08 MED ORDER — PREGABALIN 100 MG PO CAPS
100.0000 mg | ORAL_CAPSULE | Freq: Two times a day (BID) | ORAL | Status: DC
Start: 2013-06-08 — End: 2013-12-21

## 2013-06-08 NOTE — Progress Notes (Signed)
Patient ID: Katherine [name], female   DOB: 02-12-1951, 63 y.o.   MRN: [id number]   Chief complaint: High calcium  History of Present Illness:   Review of records show that she has had a high calcium since at least 2010  Lab Results  Component Value Date   CALCIUM 10.1 05/19/2013   CALCIUM 10.1 05/19/2013   CALCIUM 11.6* 05/05/2013   CALCIUM 10.1 03/23/2012   CALCIUM 10.6* 02/11/2011   CALCIUM 10.4 05/14/2010   CALCIUM 10.3 11/06/2009   CALCIUM 9.8 06/17/2009   CALCIUM 9.9 06/17/2009   CALCIUM 10.7* 04/29/2009    The hypercalcemia is not associated with any pathologic fractures, renal insufficiency, sarcoidosis, known carcinoma or hyperthyroidism.  She does have a prior history of nephrolithiasis, had passed one stone and had lithotripsy for the other 2. Last kidney stone was probably in 2002 and has had no recurrences  She has not had any known osteoporosis. She thinks her bone density 2 years ago from gynecologist was normal  Prior serologic and radiologic studies have included: Lab Results  Component Value Date   PTH 147.6* 05/19/2013   CALCIUM 10.1 05/19/2013   CALCIUM 10.1 05/19/2013   CAION 1.45* 05/19/2013   25 (OH) Vitamin D level not assessed      Medication List       This list is accurate as of: 06/08/13  9:24 AM.  Always use your most recent med list.               cyanocobalamin 1000 MCG tablet  Take 100 mcg by mouth daily.     estradiol 1 MG tablet  Commonly known as:  ESTRACE  Take 1 mg by mouth daily.     hydrochlorothiazide 25 MG tablet  Commonly known as:  HYDRODIURIL  Take 1 tablet (25 mg total) by mouth daily.     ibuprofen 200 MG tablet  Commonly known as:  ADVIL,MOTRIN  Take 200 mg by mouth every 6 (six) hours as needed.     Ketamine HCl Powd  Three times a day.     lisinopril 10 MG tablet  Commonly known as:  PRINIVIL,ZESTRIL  TAKE ONE TABLET BY MOUTH EVERY DAY     METANX 3-90.314-2-35 MG Caps  Take 1 capsule by mouth 2 (two) times daily.     multivitamin tablet  Take 1 tablet by mouth daily.     nortriptyline 25 MG capsule  Commonly known as:  PAMELOR  TAKE THREE CAPSULES BY MOUTH AT BEDTIME     oxyCODONE-acetaminophen 5-325 MG per tablet  Commonly known as:  PERCOCET/ROXICET  Take 1 tablet by mouth daily as needed.     pregabalin 100 MG capsule  Commonly known as:  LYRICA  Take 1 capsule (100 mg total) by mouth 2 (two) times daily.     traMADol 50 MG tablet  Commonly known as:  ULTRAM  Take one to two tablets by mouth every 6 hours if needed for pain        Allergies:  Allergies  Allergen Reactions  . Erythromycin     REACTION: chest pain    Past Medical History  Diagnosis Date  . Hyperlipidemia   . Chronic kidney disease     kidney stones  . Arthritis   . Hypertension   . Diverticulosis of colon   . Elevated LFTs     neg for hep b and c in 2006  . Kidney infection   . History of mumps   . Yeast infection   .  Bacterial infection   . Trichomonas   . Pelvic pain in female 10/27/10    Past Surgical History  Procedure Laterality Date  . Carpal tunnel release    . Abdominal hysterectomy    . Replacement total knee bilateral    . Rotator cuff repair    . Kidney stone surgery    . Trigger finger release  2010    right  . Knee surgery    . Vesicovaginal fistula closure w/ tah    . Dilation and curettage of uterus    . Hand surgery  06/2011    2 surgeries on left hand  . Wrist surgery  10/2011    right wrist    Family History  Problem Relation Age of Onset  . Stroke Mother   . Cancer Mother 48    bone cancer  . Colon cancer Neg Hx   . Esophageal cancer Neg Hx   . Rectal cancer Neg Hx   . Stomach cancer Neg Hx   . Diabetes Sister     Social History:  reports that she has never smoked. She has never used smokeless tobacco. She reports that she does not drink alcohol or use illicit drugs.   Review of Systems  Constitutional: Negative for malaise/fatigue.  Respiratory: Negative for cough.    Cardiovascular:       No history of CAD. Has had hypertension for 6-7 years  Gastrointestinal: Negative for abdominal pain.  Musculoskeletal: Positive for joint pain.       Has mild pain in knee joints and fingers      EXAM:  BP 118/72  Pulse 100  Temp(Src) 98.3 F (36.8 C)  Resp 16  Ht 5' 5.25" (1.657 m)  Wt 232 lb 9.6 oz (105.507 kg)  BMI 38.43 kg/m2  SpO2 98%  GENERAL: Generalized obesity present  No pallor, clubbing, lymphadenopathy or edema.  Skin:  no rash or pigmentation.  EYES:  Externally normal.    ENT: Oral mucosa and tongue normal.  THYROID:  Not palpable. No other mass palpable in the neck. No lymphadenopathy in the neck  HEART:  Normal  S1 and S2; no murmur or click.  CHEST:  Normal shape Lungs:   Vescicular breath sounds heard equally.  No crepitations/ wheeze.  ABDOMEN:  No distention.  Liver and spleen not palpable.  No other mass or tenderness.  NEUROLOGICAL: .Reflexes are normal bilaterally at biceps  SPINE AND JOINTS: Minimal changes of osteoarthritis in some fingers  Extremities: No edema present   Assessment/Plan:   HYPERPARATHYROIDISM with mild hypercalcemia She appears to have had long-standing mild hypercalcemia with some variability in her levels Also this has been confirmed with ionized calcium levels PTH is significantly high  Currently not clear if she has had any systemic effects of the hypercalcemia in the form of nephrolithiasis or osteopenia. She has not had any recurrence of her nephrolithiasis since probably 2002; however some of her hypercalcemia may have been controlled with using HCTZ over the last few years for hypertension Will assess her urine calcium 24 collection to rule out significantly high levels  Osteopenia: Not clear if she has had this since she does not know the results of her bone density done presumably done by her gynecologist 2 years ago. Will need to get the reports of her last bone density and if she  has not had one for at least 2 years will repeat it. Will also need to assess her vitamin D level and consider supplementation  for bone health if significantly low  However she is not a candidate for parathyroid surgery because of her age, lack of history of fragility fractures, normal renal function and no recent episodes of kidney stones Discussed with the patient the nature of hyperparathyroidism and systemic effects as well as options for treatment She is very  reluctant to consider surgery even if indicated and is comfortable with doing a regular followup   Rueben Kassim 06/08/2013, 9:24 AM

## 2013-06-09 LAB — VITAMIN D 25 HYDROXY (VIT D DEFICIENCY, FRACTURES): Vit D, 25-Hydroxy: 20 ng/mL — ABNORMAL LOW (ref 30.0–100.0)

## 2013-06-10 NOTE — Progress Notes (Signed)
Quick Note:  Please let patient know that the vitamin D level is low And recommend OTC vitamin D3, 2000 units daily ______

## 2013-06-12 ENCOUNTER — Other Ambulatory Visit: Payer: 59

## 2013-06-12 ENCOUNTER — Other Ambulatory Visit: Payer: Self-pay | Admitting: Neurology

## 2013-06-12 ENCOUNTER — Telehealth: Payer: Self-pay | Admitting: Nurse Practitioner

## 2013-06-12 NOTE — Telephone Encounter (Signed)
We already called this Rx in earlier today.  I called the pharmacy.  Spoke with Sharyn Lull, she verified they do have the Rx already.

## 2013-06-12 NOTE — Telephone Encounter (Signed)
Patient needing refill of Lyrica 100 mg - called to [facility] [phone].  Patient is out of this medication.

## 2013-06-12 NOTE — Telephone Encounter (Signed)
This has already been sent.

## 2013-06-12 NOTE — Telephone Encounter (Signed)
I have called in a short supply to local [facility] for patient to take until mail order arrives.  Katherine [name] has advise dthe patient of this, as she was in the office,

## 2013-06-13 LAB — CALCIUM, URINE, 24 HOUR
Calcium, 24H Urine: 374.4 mg/24 hr — ABNORMAL HIGH (ref 100.0–300.0)
Calcium, Ur: 20.8 mg/dL

## 2013-06-13 LAB — CREATININE, URINE, 24 HOUR
Creatinine, 24H Ur: 824.4 mg/24 hr (ref 800.0–1800.0)
Creatinine, Ur: 45.8 mg/dL (ref 15.0–278.0)

## 2013-06-22 ENCOUNTER — Other Ambulatory Visit: Payer: Self-pay | Admitting: Nurse Practitioner

## 2013-07-04 ENCOUNTER — Ambulatory Visit: Payer: 59 | Admitting: Nurse Practitioner

## 2013-07-05 ENCOUNTER — Other Ambulatory Visit: Payer: Self-pay | Admitting: Neurology

## 2013-07-05 ENCOUNTER — Encounter (INDEPENDENT_AMBULATORY_CARE_PROVIDER_SITE_OTHER): Payer: Self-pay

## 2013-07-05 ENCOUNTER — Ambulatory Visit (INDEPENDENT_AMBULATORY_CARE_PROVIDER_SITE_OTHER): Payer: 59 | Admitting: Nurse Practitioner

## 2013-07-05 ENCOUNTER — Encounter: Payer: Self-pay | Admitting: Nurse Practitioner

## 2013-07-05 VITALS — BP 139/82 | HR 114 | Ht 65.75 in | Wt 229.0 lb

## 2013-07-05 DIAGNOSIS — G609 Hereditary and idiopathic neuropathy, unspecified: Secondary | ICD-10-CM

## 2013-07-05 MED ORDER — NORTRIPTYLINE HCL 25 MG PO CAPS: 75.0000 mg | ORAL_CAPSULE | Freq: Every day | ORAL | Status: DC

## 2013-07-05 NOTE — Progress Notes (Signed)
[facility]  PATIENT: Katherine [name] DOB: [birth year]   REASON FOR VISIT: Followup for neuropathy    HISTORY OF PRESENT ILLNESS: Ms. Sammons, 63 year old female returns for followup. She was last seen in this office 12/29/2012. She has a sensory neuropathy with numbness  and pain in the feet which is consistent with nerve conduction findings she is currently on nortriptyline and Lyrica and Metanx with fairly good control of her symptoms. She continues to exercise 5 times a week.   HISTORY: She has been followed here since April 2010 for sensory neuropathy, with numbness and pain in the feet and consistent NCV findings. Exam demonstrated mild sensory loss in the feet, worse on the right, and diminished ankle jerks. Neuropathy labs were unremarkable, except for a slightly elevated HbA1c of 5.9. Advised to take a multivitamin. Gabapentin provided incomplete relief, and she had some difficulties with drowsiness. Nortriptyline was added at doses increasing to 50 mg q.h.s. She reports ongoing soreness of feet, not bad, but had 2 bouts of severe pain lasting 30-45 minutes, mostly in evenings; given tramadol.  12/29/12 Remains on Lyrica 182m BID and Nortriptyline 778mat night. . She continues to have occasional foot pain, She takes tramadol for pain. Transdermal cream was stopped due to insurance issues, she is back on that. She has stopped her Metanx insurance would not pay She is doing water aerobics 3 to 4 times weekly. She continues to retain fluid around the ankles.   REVIEW OF SYSTEMS: Full 14 system review of systems performed and notable only for those listed, all others are neg:  Constitutional: N/A  Cardiovascular: N/A  Ear/Nose/Throat: N/A  Skin: N/A  Eyes: N/A  Respiratory: N/A  Gastroitestinal: N/A  Hematology/Lymphatic: N/A  Endocrine: Intolerance to cold Musculoskeletal: Joint pain Allergy/Immunology: N/A  Neurological: dizziness, headache,  numbness Psychiatric: N/A   ALLERGIES: Allergies  Allergen Reactions  . Erythromycin     REACTION: chest pain    HOME MEDICATIONS: Outpatient Prescriptions Prior to Visit  Medication Sig Dispense Refill  . cyanocobalamin 1000 MCG tablet Take 100 mcg by mouth daily.       . Marland Kitchenstradiol (ESTRACE) 1 MG tablet Take 1 mg by mouth daily.      . hydrochlorothiazide (HYDRODIURIL) 25 MG tablet Take 1 tablet (25 mg total) by mouth daily.  90 tablet  3  . ibuprofen (ADVIL,MOTRIN) 200 MG tablet Take 200 mg by mouth every 6 (six) hours as needed.        . Marland Kitchenetamine HCl POWD Three times a day.      . Marland Kitchen-Methylfolate-Algae-B12-B6 (METANX) 3-90.314-2-35 MG CAPS Take one capsule by mouth two times daily  180 capsule  1  . lisinopril (PRINIVIL,ZESTRIL) 10 MG tablet TAKE ONE TABLET BY MOUTH EVERY DAY  90 tablet  3  . Multiple Vitamin (MULTIVITAMIN) tablet Take 1 tablet by mouth daily.        . nortriptyline (PAMELOR) 25 MG capsule TAKE THREE CAPSULES BY MOUTH AT BEDTIME  90 capsule  0  . oxyCODONE-acetaminophen (PERCOCET) 5-325 MG per tablet Take 1 tablet by mouth daily as needed.       . pregabalin (LYRICA) 100 MG capsule Take 1 capsule (100 mg total) by mouth 2 (two) times daily.  180 capsule  1  . traMADol (ULTRAM) 50 MG tablet Take one to two tablets by mouth every 6 hours if needed for pain  60 tablet  3   No facility-administered medications prior to visit.    PAST MEDICAL HISTORY:  Past Medical History  Diagnosis Date  . Hyperlipidemia   . Chronic kidney disease     kidney stones  . Arthritis   . Hypertension   . Diverticulosis of colon   . Elevated LFTs     neg for hep b and c in 2006  . Kidney infection   . History of mumps   . Yeast infection   . Bacterial infection   . Trichomonas   . Pelvic pain in female 10/27/10    PAST SURGICAL HISTORY: Past Surgical History  Procedure Laterality Date  . Carpal tunnel release    . Abdominal hysterectomy    . Replacement total knee  bilateral    . Rotator cuff repair    . Kidney stone surgery    . Trigger finger release  2010    right  . Knee surgery    . Vesicovaginal fistula closure w/ tah    . Dilation and curettage of uterus    . Hand surgery  06/2011    2 surgeries on left hand  . Wrist surgery  10/2011    right wrist    FAMILY HISTORY: Family History  Problem Relation Age of Onset  . Stroke Mother   . Cancer Mother 60    bone cancer  . Colon cancer Neg Hx   . Esophageal cancer Neg Hx   . Rectal cancer Neg Hx   . Stomach cancer Neg Hx   . Diabetes Sister     SOCIAL HISTORY: History   Social History  . Marital Status: Single    Spouse Name: N/A    Number of Children: 1  . Years of Education: N/A   Occupational History  .     Social History Main Topics  . Smoking status: Never Smoker   . Smokeless tobacco: Never Used  . Alcohol Use: No  . Drug Use: No  . Sexual Activity: No   Other Topics Concern  . Not on file   Social History Narrative   Patient is single, has 1 child   Patient is right handed   Caffeine consumption is 0     PHYSICAL EXAM  Filed Vitals:   07/05/13 0832  BP: 139/82  Pulse: 114  Height: 5' 5.75" (1.67 m)  Weight: 229 lb (103.874 kg)   Body mass index is 37.25 kg/(m^2).  Generalized: Well developed, obese female in no acute distress  Skin mild edema of the ankles Neurological examination   Mentation: Alert oriented to time, place, history taking. Follows all commands speech and language fluent  Cranial nerve II-XII: Fundoscopic exam reveals sharp disc margins.Pupils were equal round reactive to light extraocular movements were full, visual field were full on confrontational test. Facial sensation and strength were normal. hearing was intact to finger rubbing bilaterally. Uvula tongue midline. head turning and shoulder shrug were normal and symmetric.Tongue protrusion into cheek strength was normal. Motor: normal bulk and tone, full strength in the BUE,  BLE, fine finger movements normal, no pronator drift. No focal weakness Sensory: normal and symmetric to light touch, pinprick, and  vibration and position sense in both lower extremities Coordination: finger-nose-finger, heel-to-shin bilaterally, no dysmetria Reflexes: 1+ upper and lower and symmetric except absent ankle jerks  Gait and Station: Rising up from seated position without assistance, normal stance,  moderate stride, good arm swing, smooth turning, able to perform tiptoe, and heel walking without difficulty. Tandem gait is steady  DIAGNOSTIC DATA (LABS, IMAGING, TESTING) - I reviewed patient records, labs,  notes, testing and imaging myself where available.  Lab Results  Component Value Date   WBC 4.8 05/05/2013   HGB 13.3 05/05/2013   HCT 39.5 05/05/2013   MCV 89.9 05/05/2013   PLT 163.0 05/05/2013      Component Value Date/Time   NA 137 05/19/2013 0848   K 3.5 05/19/2013 0848   CL 105 05/19/2013 0848   CO2 27 05/19/2013 0848   GLUCOSE 101* 05/19/2013 0848   BUN 9 05/19/2013 0848   CREATININE 0.8 05/19/2013 0848   CALCIUM 10.1 05/19/2013 0848   CALCIUM 10.1 05/19/2013 0848   CALCIUM 9.8 06/17/2009 2212   PROT 6.7 05/05/2013 0823   ALBUMIN 3.8 05/05/2013 0823   AST 47* 05/05/2013 0823   ALT 24 05/05/2013 0823   ALKPHOS 49 05/05/2013 0823   BILITOT 0.7 05/05/2013 0823   GFRNONAA 106.06 11/06/2009 0814   GFRAA  Value: >60        The eGFR has been calculated using the MDRD equation. This calculation has not been validated in all clinical situations. eGFR's persistently <60 mL/min signify possible Chronic Kidney Disease. 08/01/2008 1600   Lab Results  Component Value Date   CHOL 172 05/05/2013   HDL 46.70 05/05/2013   LDLCALC 91 05/05/2013   LDLDIRECT 139.1 02/11/2011   TRIG 170.0* 05/05/2013   CHOLHDL 4 05/05/2013   Lab Results  Component Value Date   HGBA1C 5.8 08/08/2012    Lab Results  Component Value Date   TSH 1.39 05/05/2013      ASSESSMENT AND PLAN  63 y.o. year  old female  has a past medical history of peripheral neuropathy in good control with Lyrica, nortriptyline and Metanx. She occasionally uses transdermal cream  Continue Nortriptyline will refill Continue Lyrica at current dose.  Continue Metanx F/U 6 months Dennie Bible, [facility], [facility], APRN  [facility] [address], [name], [place] [address] [phone]

## 2013-07-05 NOTE — Patient Instructions (Signed)
Continue Nortriptyline will refill Continue Lyrica at current dose.  Continue Metanx F/U 6 months

## 2013-07-07 ENCOUNTER — Encounter (HOSPITAL_COMMUNITY): Payer: Self-pay | Admitting: Emergency Medicine

## 2013-07-07 ENCOUNTER — Telehealth: Payer: Self-pay | Admitting: Internal Medicine

## 2013-07-07 ENCOUNTER — Emergency Department (HOSPITAL_COMMUNITY)
Admission: EM | Admit: 2013-07-07 | Discharge: 2013-07-07 | Disposition: A | Discharge status: Home / Self Care | Payer: 59 | Source: Home / Self Care | Attending: Emergency Medicine | Admitting: Emergency Medicine

## 2013-07-07 DIAGNOSIS — N3000 Acute cystitis without hematuria: Secondary | ICD-10-CM

## 2013-07-07 LAB — POCT URINALYSIS DIP (DEVICE)
BILIRUBIN URINE: NEGATIVE
Glucose, UA: NEGATIVE mg/dL
KETONES UR: NEGATIVE mg/dL
NITRITE: NEGATIVE
Protein, ur: NEGATIVE mg/dL
Specific Gravity, Urine: >=1.03 (ref 1.005–1.030)
Urobilinogen, UA: 0.2 mg/dL (ref 0.0–1.0)
pH: 6 (ref 5.0–8.0)

## 2013-07-07 MED ORDER — PHENAZOPYRIDINE HCL 200 MG PO TABS: 200.0000 mg | ORAL_TABLET | Freq: Three times a day (TID) | ORAL | Status: DC | PRN

## 2013-07-07 MED ORDER — CEPHALEXIN 500 MG PO CAPS: 500.0000 mg | ORAL_CAPSULE | Freq: Three times a day (TID) | ORAL | Status: DC

## 2013-07-07 NOTE — ED Notes (Signed)
Will discharge after EKG.  Mw,cma

## 2013-07-07 NOTE — Telephone Encounter (Signed)
Pt calling for refill for Valtrex for cold sore.  Pt is in [place].  This was last ordered/filled on 12/17/2011 and instructed it cannot be refilled if it has been over a year.  He may go to an U/C or E/D.  He states "pfttt that is not going to happen and hung up".

## 2013-07-07 NOTE — ED Provider Notes (Signed)
Chief Complaint   Chief Complaint  Patient presents with  . Urinary Tract Infection    History of Present Illness   Katherine [name] is a 63 year old female who has had a four-day history of dysuria, frequency, and urgency. She denies any blood in the urine. She's had no lower abdominal or lower back pain. She's had chills, felt tired, and felt nauseated but no fever or vomiting. She denies any diarrhea, constipation, or GYN complaints. She did have urinary tract infection but this was years ago. Her heart rate was noted to be somewhat fast today. She denies any awareness of palpitations or rapid heartbeat. She's had no chest pain, tightness, pressure, or shortness of breath.  Review of Systems   Other than as noted above, the patient denies any of the following symptoms: General:  No fevers, chills, or sweats. GI:  No abdominal pain, back pain, nausea, vomiting, diarrhea, or constipation. GU:  No dysuria, frequency, urgency, hematuria, or incontinence. GYN:  No discharge, itching, vulvar pain or lesions, pelvic pain, or abnormal vaginal bleeding.  [place]   Past medical history, family history, social history, meds, and allergies were reviewed.  She is allergic to erythromycin. Current meds include vitamin D 3, vitamin B12, estradiol, hydrochlorothiazide, lisinopril, Pamelor, Percocet, Lyrica, and Ultram. She has a history of hyperlipidemia, chronic kidney disease, arthritis, and hypertension.  Physical Examination     Vital signs:  BP 131/81  Pulse 114  Temp(Src) 97.9 F (36.6 C) (Oral)  Resp 18  SpO2 100% Gen:  Alert, oriented, in no distress. Lungs:  Clear to auscultation, no wheezes, rales or rhonchi. Heart:  Regular rhythm, no gallop or murmer. Abdomen:  Flat and soft. There was slight suprapubic pain to palpation.  No guarding, or rebound.  No hepato-splenomegaly or mass.  Bowel sounds were normally active.  No hernia. Back:  No CVA tenderness.  Skin:  Clear, warm and  dry.  Labs   Results for orders placed during the hospital encounter of 07/07/13  POCT URINALYSIS DIP (DEVICE)      Result Value Ref Range   Glucose, UA NEGATIVE  NEGATIVE mg/dL   Bilirubin Urine NEGATIVE  NEGATIVE   Ketones, ur NEGATIVE  NEGATIVE mg/dL   Specific Gravity, Urine >=1.030  1.005 - 1.030   Hgb urine dipstick TRACE (*) NEGATIVE   pH 6.0  5.0 - 8.0   Protein, ur NEGATIVE  NEGATIVE mg/dL   Urobilinogen, UA 0.2  0.0 - 1.0 mg/dL   Nitrite NEGATIVE  NEGATIVE   Leukocytes, UA TRACE (*) NEGATIVE    A urine culture was obtained.  Results are pending at this time and we will call about any positive results.  EKG Results    Date: 07/07/2013  Rate: 99  Rhythm: normal sinus rhythm  QRS Axis: normal  Intervals: normal  ST/T Wave abnormalities: normal  Conduction Disutrbances:none  Narrative Interpretation: Normal sinus rhythm, normal EKG.  Old EKG Reviewed: none available  Assessment   The encounter diagnosis was Acute cystitis.   No evidence of pyelonephritis.  Source of rapid heartbeat is uncertain. She may be mildly dehydrated, but her heart rate normalized while she was having EKG done.  Plan   1.  Meds:  The following meds were prescribed:   Discharge Medication List as of 07/07/2013  7:27 PM    START taking these medications   Details  cephALEXin (KEFLEX) 500 MG capsule Take 1 capsule (500 mg total) by mouth 3 (three) times daily., Starting 07/07/2013, Until Discontinued, Normal  phenazopyridine (PYRIDIUM) 200 MG tablet Take 1 tablet (200 mg total) by mouth 3 (three) times daily as needed for pain., Starting 07/07/2013, Until Discontinued, Normal        2.  Patient Education/Counseling:  The patient was given appropriate handouts, self care instructions, and instructed in symptomatic relief. The patient was told to avoid intercourse for 10 days, get extra fluids, and return for a follow up with her primary care doctor at the completion of treatment for a  repeat UA and culture.    3.  Follow up:  The patient was told to follow up here if no better in 3 to 4 days, or sooner if becoming worse in any way, and given some red flag symptoms such as fever, persistent vomiting, or severe flank or abdominal pain which would prompt immediate return.     Harden Mo, MD 07/07/13 [phone]

## 2013-07-07 NOTE — Discharge Instructions (Signed)
Urinary Tract Infection  Urinary tract infections (UTIs) can develop anywhere along your urinary tract. Your urinary tract is your body's drainage system for removing wastes and extra water. Your urinary tract includes two kidneys, two ureters, a bladder, and a urethra. Your kidneys are a pair of bean-shaped organs. Each kidney is about the size of your fist. They are located below your ribs, one on each side of your spine.  CAUSES  Infections are caused by microbes, which are microscopic organisms, including fungi, viruses, and bacteria. These organisms are so small that they can only be seen through a microscope. Bacteria are the microbes that most commonly cause UTIs.  SYMPTOMS   Symptoms of UTIs may vary by age and gender of the patient and by the location of the infection. Symptoms in young women typically include a frequent and intense urge to urinate and a painful, burning feeling in the bladder or urethra during urination. Older women and men are more likely to be tired, shaky, and weak and have muscle aches and abdominal pain. A fever may mean the infection is in your kidneys. Other symptoms of a kidney infection include pain in your back or sides below the ribs, nausea, and vomiting.  DIAGNOSIS  To diagnose a UTI, your caregiver will ask you about your symptoms. Your caregiver also will ask to provide a urine sample. The urine sample will be tested for bacteria and white blood cells. White blood cells are made by your body to help fight infection.  TREATMENT   Typically, UTIs can be treated with medication. Because most UTIs are caused by a bacterial infection, they usually can be treated with the use of antibiotics. The choice of antibiotic and length of treatment depend on your symptoms and the type of bacteria causing your infection.  HOME CARE INSTRUCTIONS   If you were prescribed antibiotics, take them exactly as your caregiver instructs you. Finish the medication even if you feel better after you  have only taken some of the medication.   Drink enough water and fluids to keep your urine clear or pale yellow.   Avoid caffeine, tea, and carbonated beverages. They tend to irritate your bladder.   Empty your bladder often. Avoid holding urine for long periods of time.   Empty your bladder before and after sexual intercourse.   After a bowel movement, women should cleanse from front to back. Use each tissue only once.  SEEK MEDICAL CARE IF:    You have back pain.   You develop a fever.   Your symptoms do not begin to resolve within 3 days.  SEEK IMMEDIATE MEDICAL CARE IF:    You have severe back pain or lower abdominal pain.   You develop chills.   You have nausea or vomiting.   You have continued burning or discomfort with urination.  MAKE SURE YOU:    Understand these instructions.   Will watch your condition.   Will get help right away if you are not doing well or get worse.  Document Released: 02/04/2005 Document Revised: 10/27/2011 Document Reviewed: 06/05/2011  ExitCare Patient Information 2014 [employer].

## 2013-07-07 NOTE — ED Notes (Signed)
C/o urinary frequency.  Pressure.  Pain.  Symptoms present since Monday.  Denies n/v and lower back pain.

## 2013-07-09 LAB — URINE CULTURE
Colony Count: 2000
Special Requests: NORMAL

## 2013-08-02 ENCOUNTER — Other Ambulatory Visit: Payer: Self-pay | Admitting: Internal Medicine

## 2013-09-01 ENCOUNTER — Telehealth: Payer: Self-pay | Admitting: Neurology

## 2013-09-01 NOTE — Telephone Encounter (Signed)
I called [facility].  Spoke with the pharmacist.  He said they are able to do some compounds, but not all. I have faxed them the info and they will see if they are able to compound this drug.  They will contact the patient regarding this.  If there are any problems, they will call us back.

## 2013-09-01 NOTE — Telephone Encounter (Signed)
Patient calling to state that her pharmacy notified her that the company who makes her compounded cream medication has discontinued their contract with them. Please call patient and advise so she can find another pharmacy that is able to give her medication.

## 2013-09-01 NOTE — Telephone Encounter (Signed)
We will be happy to send her Rx to whatever pharmacy accepts her ins.  I spoke with the patient.  She said she will call her ins to find out who they are contracted through for compounded drugs, and she will call us back to provide that info.

## 2013-09-01 NOTE — Telephone Encounter (Signed)
Pt called states the compounding company to use for her prescription is [facility]. [place], [place] Fax [phone]. Please call pt back once this has been called in. Thanks

## 2013-09-19 ENCOUNTER — Other Ambulatory Visit: Payer: Self-pay | Admitting: Nurse Practitioner

## 2013-09-19 MED ORDER — TRAMADOL HCL 50 MG PO TABS: ORAL_TABLET | ORAL | Status: DC

## 2013-09-19 NOTE — Telephone Encounter (Signed)
Request forwarded to provider for approval  

## 2013-09-19 NOTE — Telephone Encounter (Signed)
Patient calling requesting authorization for traMADol (ULTRAM) 50 MG tablet sent to [facility] on [address]. Thanks

## 2013-09-20 NOTE — Telephone Encounter (Signed)
Rx signed and faxed.

## 2013-09-26 ENCOUNTER — Telehealth: Payer: Self-pay | Admitting: Nurse Practitioner

## 2013-09-26 NOTE — Telephone Encounter (Signed)
Patient called and stated [facility] states that they did not receive faxed refill request for traMADol (ULTRAM) 50 MG tablet.  Thanks

## 2013-09-26 NOTE — Telephone Encounter (Signed)
[facility] is the pharmacy that requested the Rx, so that is where it was sent.  [facility] a [facility] on the [facility].  I called the patient back.  Got no answer.  Left message saying Rx was sent to [facility], and if she would like [facility] instead, we will need to know which one she uses.

## 2013-09-27 ENCOUNTER — Telehealth: Payer: Self-pay | Admitting: Neurology

## 2013-09-27 NOTE — Telephone Encounter (Signed)
I called [facility].  Spoke with Sharyn Lull.  They do have the Rx.  I called the patient back.  She is aware the pharmacy has the Rx and will follow up with them.

## 2013-09-27 NOTE — Telephone Encounter (Signed)
Patient calling back and stated [facility] has not received rx fax for traMADol (ULTRAM) 50 MG tablet.  Verified fax number for [place].  Patient questioning if she could pick up rx from office.  Please call and lm if she doesn't answer.  Thanks

## 2013-11-04 ENCOUNTER — Other Ambulatory Visit: Payer: Self-pay | Admitting: Internal Medicine

## 2013-12-06 ENCOUNTER — Encounter: Payer: Self-pay | Admitting: Internal Medicine

## 2013-12-21 ENCOUNTER — Other Ambulatory Visit: Payer: Self-pay

## 2013-12-21 ENCOUNTER — Telehealth: Payer: Self-pay | Admitting: Nurse Practitioner

## 2013-12-21 MED ORDER — PREGABALIN 100 MG PO CAPS: 100.0000 mg | ORAL_CAPSULE | Freq: Two times a day (BID) | ORAL | Status: DC

## 2013-12-21 MED ORDER — METANX 3-90.314-2-35 MG PO CAPS: ORAL_CAPSULE | ORAL | Status: DC

## 2013-12-21 NOTE — Telephone Encounter (Signed)
Metanx Rx has been sent to [facility].  I spoke with the patient.  Offered to call in a small Rx of Lyrica to local pharmacy to last until mail order arrives.  Patient declined.  She is aware we do not have samples of the Lyrica dose she is on.  She asked for 50mg  caps and will take two to equal her dose of 100mg .

## 2013-12-21 NOTE — Telephone Encounter (Signed)
Patient requesting Rx refill for pregabalin (LYRICA) 100 MG capsule, Rx needs to be sent to [facility], L-Methylfolate-Algae-B12-B6 (METANX) 3-90.314-2-35 MG CAPS rx need to sent to [facility].  Patient questioning if we have samples of Lyrica, due to put last pill in dispenser for tomorrow.  Please return call anytime and leave message if not available.

## 2013-12-21 NOTE — Telephone Encounter (Signed)
Rx signed and faxed.

## 2013-12-21 NOTE — Telephone Encounter (Signed)
Called pt and left message informing her that her medication was left at the front desk for her to pick up and if she has any other problems, questions or concerns to call the office.

## 2013-12-21 NOTE — Telephone Encounter (Signed)
Dr Yan is out of the office.  Forwarding request to [facility] for approval  

## 2013-12-21 NOTE — Telephone Encounter (Signed)
Called pt and left message informing her that her medication was ready to be picked up at the front desk and if she has any other problems, questions or concerns to call the office.

## 2013-12-25 ENCOUNTER — Telehealth: Payer: Self-pay | Admitting: Nurse Practitioner

## 2013-12-25 NOTE — Telephone Encounter (Signed)
Patient stated [facility] still waiting on authorization request for pregabalin (LYRICA) 100 MG capsule.  Please call anytime and advise, may leave detailed message on vm.  Thanks

## 2013-12-25 NOTE — Telephone Encounter (Signed)
We already sent this prescription to [facility].  It usually takes them several days to process orders.  I called the pharmacy.  Spoke with [employer]  He said they did get the Rx last week, and they are still processing it.  I called the patient back.  Got no answer.  Left message.

## 2014-01-02 ENCOUNTER — Telehealth: Payer: Self-pay

## 2014-01-02 ENCOUNTER — Telehealth: Payer: Self-pay | Admitting: Internal Medicine

## 2014-01-02 NOTE — Telephone Encounter (Signed)
Patient received a letter saying a prior Josem Kaufmann may be required on her compound cream.  I contacted [facility] ([facility]).  They responded with a fax saying our request has been cancelled and the pharmacy should call the helpdesk at [phone] if they have trouble processing the Rx.  Ref # [id number]. I called ins at [phone] to clarify info from fax.  Spoke with Debbie.  She said we have done everything we can on our end, nothing further is needed.  States the pharmacy themselves will need to call the helpdesk to speak with the clinical team and obtain an override to fill this Rx if they have any issues with the claim.  I have sent a copy of the letter we were faxed to the pharmacy for their records.

## 2014-01-02 NOTE — Telephone Encounter (Signed)
Pt would like to est with dr Burnice Logan. Can I sch?

## 2014-01-02 NOTE — Telephone Encounter (Signed)
ok 

## 2014-01-03 ENCOUNTER — Ambulatory Visit (INDEPENDENT_AMBULATORY_CARE_PROVIDER_SITE_OTHER): Payer: 59 | Admitting: Nurse Practitioner

## 2014-01-03 ENCOUNTER — Encounter: Payer: Self-pay | Admitting: Nurse Practitioner

## 2014-01-03 VITALS — BP 102/66 | HR 99 | Ht 65.75 in | Wt 232.8 lb

## 2014-01-03 DIAGNOSIS — G609 Hereditary and idiopathic neuropathy, unspecified: Secondary | ICD-10-CM

## 2014-01-03 MED ORDER — TRAMADOL HCL 50 MG PO TABS: ORAL_TABLET | ORAL | Status: DC

## 2014-01-03 MED ORDER — PREGABALIN 100 MG PO CAPS: 100.0000 mg | ORAL_CAPSULE | Freq: Two times a day (BID) | ORAL | Status: DC

## 2014-01-03 NOTE — Telephone Encounter (Signed)
lmom for pt to cb

## 2014-01-03 NOTE — Progress Notes (Signed)
[facility]  PATIENT: Katherine [name] DOB: 1951/03/03   REASON FOR VISIT: Followup for peripheral neuropathy   HISTORY OF PRESENT ILLNESS:Katherine [name], 63 year old female returns for followup. She was last seen in this office 07/05/13. 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 She also takes occasional Ultram She continues to exercise 5 times a week . She returns for reevaluation    HISTORY: She has been followed here since April 2010 for sensory neuropathy, with numbness and pain in the feet and consistent NCV findings. Exam demonstrated mild sensory loss in the feet, worse on the right, and diminished ankle jerks. Neuropathy labs were unremarkable, except for a slightly elevated HbA1c of 5.9. Advised to take a multivitamin. Gabapentin provided incomplete relief, and she had some difficulties with drowsiness. Nortriptyline was added at doses increasing to 50 mg q.h.s. She reports ongoing soreness of feet, not bad, but had 2 bouts of severe pain lasting 30-45 minutes, mostly in evenings; given tramadol.  12/29/12 Remains on Lyrica 129m BID and Nortriptyline 736mat night. . She continues to have occasional foot pain, She takes tramadol for pain. Transdermal cream was stopped due to insurance issues, she is back on that. She has stopped her Metanx insurance would not pay She is doing water aerobics 3 to 4 times weekly. She continues to retain fluid around the ankles.    REVIEW OF SYSTEMS: Full 14 system review of systems performed and notable only for those listed, all others are neg:  Constitutional: N/A  Cardiovascular: N/A  Ear/Nose/Throat: N/A  Skin: N/A  Eyes: N/A  Respiratory: N/A  Gastroitestinal: N/A  Hematology/Lymphatic: N/A  Endocrine: Cold intolerance Musculoskeletal: Back pain, history of  degenerative disc disease  Allergy/Immunology: N/A  Neurological: Numbness Psychiatric: N/A Sleep : NA   ALLERGIES: Allergies  Allergen Reactions  . Erythromycin     REACTION: chest pain    HOME MEDICATIONS: Outpatient Prescriptions Prior to Visit  Medication Sig Dispense Refill  . cephALEXin (KEFLEX) 500 MG capsule Take 1 capsule (500 mg total) by mouth 3 (three) times daily.  30 capsule  0  . Cholecalciferol (VITAMIN D-3 PO) Take 200 Units by mouth.      . cyanocobalamin 1000 MCG tablet Take 100 mcg by mouth daily.       . hydrochlorothiazide (HYDRODIURIL) 25 MG tablet TAKE ONE TABLET BY MOUTH ONCE DAILY  90 tablet  1  . ibuprofen (ADVIL,MOTRIN) 200 MG tablet Take 200 mg by mouth every 6 (six) hours as needed.        . Marland Kitchenetamine HCl POWD Three times a day.      . Marland Kitchen-Methylfolate-Algae-B12-B6 (METANX) 3-90.314-2-35 MG CAPS Take one capsule by mouth two times daily  180 capsule  0  . lisinopril (PRINIVIL,ZESTRIL) 10 MG tablet TAKE ONE TABLET BY MOUTH ONCE DAILY  90 tablet  1  . Multiple Vitamin (MULTIVITAMIN) tablet Take 1 tablet by mouth daily.        . nortriptyline (PAMELOR) 25 MG capsule Take 3 capsules (75 mg total) by mouth at bedtime.  90 capsule  6  . oxyCODONE-acetaminophen (PERCOCET) 5-325 MG per tablet Take 1 tablet by mouth daily as needed.       . pregabalin (LYRICA) 100 MG capsule Take 1 capsule (100 mg total) by mouth 2 (two) times daily.  180 capsule  0  .  traMADol (ULTRAM) 50 MG tablet Take one to two tablets by mouth every 6 hours if needed for pain  60 tablet  3  . phenazopyridine (PYRIDIUM) 200 MG tablet Take 1 tablet (200 mg total) by mouth 3 (three) times daily as needed for pain.  15 tablet  0  . estradiol (ESTRACE) 1 MG tablet Take 1 mg by mouth daily.       No facility-administered medications prior to visit.    PAST MEDICAL HISTORY: Past Medical History  Diagnosis Date  . Hyperlipidemia   . Chronic kidney disease     kidney stones  . Arthritis   .  Hypertension   . Diverticulosis of colon   . Elevated LFTs     neg for hep b and c in 2006  . Kidney infection   . History of mumps   . Yeast infection   . Bacterial infection   . Trichomonas   . Pelvic pain in female 10/27/10    PAST SURGICAL HISTORY: Past Surgical History  Procedure Laterality Date  . Carpal tunnel release    . Abdominal hysterectomy    . Replacement total knee bilateral    . Rotator cuff repair    . Kidney stone surgery    . Trigger finger release  2010    right  . Knee surgery    . Vesicovaginal fistula closure w/ tah    . Dilation and curettage of uterus    . Hand surgery  06/2011    2 surgeries on left hand  . Wrist surgery  10/2011    right wrist    FAMILY HISTORY: Family History  Problem Relation Age of Onset  . Stroke Mother   . Cancer Mother 7    bone cancer  . Colon cancer Neg Hx   . Esophageal cancer Neg Hx   . Rectal cancer Neg Hx   . Stomach cancer Neg Hx   . Diabetes Sister     SOCIAL HISTORY: History   Social History  . Marital Status: Single    Spouse Name: N/A    Number of Children: 1  . Years of Education: N/A   Occupational History  .     Social History Main Topics  . Smoking status: Never Smoker   . Smokeless tobacco: Never Used  . Alcohol Use: No  . Drug Use: No  . Sexual Activity: No   Other Topics Concern  . Not on file   Social History Narrative   Patient is single, has 1 child   Patient is right handed   Caffeine consumption is 0     PHYSICAL EXAM  Filed Vitals:   01/03/14 0833  BP: 102/66  Pulse: 99  Height: 5' 5.75" (1.67 m)  Weight: 232 lb 12.8 oz (105.597 kg)   Body mass index is 37.86 kg/(m^2). Generalized: Well developed, obese female in no acute distress  Skin mild edema of the ankles  Neurological examination  Mentation: Alert oriented to time, place, history taking. Follows all commands speech and language fluent  Cranial nerve II-XII: .Pupils were equal round reactive to light  extraocular movements were full, visual field were full on confrontational test. Facial sensation and strength were normal. hearing was intact to finger rubbing bilaterally. Uvula tongue midline. head turning and shoulder shrug were normal and symmetric.Tongue protrusion into cheek strength was normal.  Motor: normal bulk and tone, full strength in the BUE, BLE, fine finger movements normal, no pronator drift. No focal weakness  Sensory: normal and symmetric to light touch, pinprick, and vibration and position sense in both lower extremities  Coordination: finger-nose-finger, heel-to-shin bilaterally, no dysmetria  Reflexes: 1+ upper and lower and symmetric except absent ankle jerks  Gait and Station: Rising up from seated position without assistance, normal stance, moderate stride, good arm swing, smooth turning, able to perform tiptoe, and heel walking without difficulty. Tandem gait is steady no assistive device  DIAGNOSTIC DATA (LABS, IMAGING, TESTING) - I reviewed patient records, labs, notes, testing and imaging myself where available.  Lab Results  Component Value Date   WBC 4.8 05/05/2013   HGB 13.3 05/05/2013   HCT 39.5 05/05/2013   MCV 89.9 05/05/2013   PLT 163.0 05/05/2013      Component Value Date/Time   NA 137 05/19/2013 0848   K 3.5 05/19/2013 0848   CL 105 05/19/2013 0848   CO2 27 05/19/2013 0848   GLUCOSE 101* 05/19/2013 0848   BUN 9 05/19/2013 0848   CREATININE 0.8 05/19/2013 0848   CALCIUM 10.1 05/19/2013 0848   CALCIUM 10.1 05/19/2013 0848   CALCIUM 9.8 06/17/2009 2212   PROT 6.7 05/05/2013 0823   ALBUMIN 3.8 05/05/2013 0823   AST 47* 05/05/2013 0823   ALT 24 05/05/2013 0823   ALKPHOS 49 05/05/2013 0823   BILITOT 0.7 05/05/2013 0823   GFRNONAA 106.06 11/06/2009 0814   GFRAA  Value: >60        The eGFR has been calculated using the MDRD equation. This calculation has not been validated in all clinical situations. eGFR's persistently <60 mL/min signify possible Chronic Kidney  Disease. 08/01/2008 1600   Lab Results  Component Value Date   CHOL 172 05/05/2013   HDL 46.70 05/05/2013   LDLCALC 91 05/05/2013   LDLDIRECT 139.1 02/11/2011   TRIG 170.0* 05/05/2013   CHOLHDL 4 05/05/2013   Lab Results  Component Value Date   HGBA1C 5.8 08/08/2012   No results found for this basename: VITAMINB12   Lab Results  Component Value Date   TSH 1.39 05/05/2013      ASSESSMENT AND PLAN  63 y.o. year old female  has a past medical history of peripheral neuropathy in good control with Lyrica nortriptyline and Metanx. She takes occasional Ultram   Continue nortriptyline at current dose will refill in 3 weeks mail-order Continue Lyrica at current dose will refill mail-order Continue Ultram at current dose will refill mail-order Continue water aerobics and exercise class Followup in 6-8 months Dennie Bible, [facility], [facility], APRN  [facility] [address], [place], [place] [address] [phone]

## 2014-01-03 NOTE — Patient Instructions (Signed)
Continue nortriptyline at current dose will refill in 3 weeks mail-order Continue Lyrica at current dose will refill mail-order Continue Ultram at current dose will refill mail-order Continue water aerobics and exercise class Followup in 6-8 months

## 2014-01-03 NOTE — Telephone Encounter (Signed)
S/w pt she is aware that Dr Raliegh Ip said he would except her as his pt .

## 2014-01-08 ENCOUNTER — Encounter: Payer: Self-pay | Admitting: Internal Medicine

## 2014-01-08 ENCOUNTER — Ambulatory Visit (INDEPENDENT_AMBULATORY_CARE_PROVIDER_SITE_OTHER): Payer: 59 | Admitting: Internal Medicine

## 2014-01-08 VITALS — BP 119/82 | HR 112 | Temp 98.3°F | Resp 20 | Ht 65.75 in | Wt 231.0 lb

## 2014-01-08 DIAGNOSIS — I10 Essential (primary) hypertension: Secondary | ICD-10-CM

## 2014-01-08 DIAGNOSIS — M545 Low back pain, unspecified: Secondary | ICD-10-CM

## 2014-01-08 DIAGNOSIS — G622 Polyneuropathy due to other toxic agents: Secondary | ICD-10-CM

## 2014-01-08 DIAGNOSIS — G619 Inflammatory polyneuropathy, unspecified: Secondary | ICD-10-CM

## 2014-01-08 NOTE — Patient Instructions (Signed)
Most patients with low back pain will improve with time over the next two to 6 weeks.  Keep active but avoid any activities that cause pain.  Apply moist heat to the low back area several times daily.  Limit your sodium (Salt) intake    It is important that you exercise regularly, at least 20 minutes 3 to 4 times per week.  If you develop chest pain or shortness of breath seek  medical attention.  You need to lose weight.  Consider a lower calorie diet and regular exercise.

## 2014-01-08 NOTE — Progress Notes (Signed)
Pre visit review using our clinic review tool, if applicable. No additional management support is needed unless otherwise documented below in the visit note. 

## 2014-01-08 NOTE — Progress Notes (Signed)
Subjective:    Patient ID: Katherine [name], female    DOB: 01/05/1951, 63 y.o.   MRN: [id number]  HPI   63 year old patient who presents today with a chief complaint of low back pain.  She has history of chronic low back pain.  She states for number of years, at least intermittently.  Radiographs were checked in 2012 that revealed multilevel degenerative changes.  She states her low back pain has worsened over the past 7 or 8 months.  She also describes some pain radiating down both legs more often.  The left, but frequently the right.  She is followed by neurology for a peripheral neuropathy. She has treated hypertension No constitutional complaints No motor weakness.  She remains active with an exercise regimen in [employer] program  Past Medical History  Diagnosis Date  . Hyperlipidemia   . Chronic kidney disease     kidney stones  . Arthritis   . Hypertension   . Diverticulosis of colon   . Elevated LFTs     neg for hep b and c in 2006  . Kidney infection   . History of mumps   . Yeast infection   . Bacterial infection   . Trichomonas   . Pelvic pain in female 10/27/10    History   Social History  . Marital Status: Single    Spouse Name: N/A    Number of Children: 1  . Years of Education: N/A   Occupational History  .     Social History Main Topics  . Smoking status: Never Smoker   . Smokeless tobacco: Never Used  . Alcohol Use: No  . Drug Use: No  . Sexual Activity: No   Other Topics Concern  . Not on file   Social History Narrative   Patient is single, has 1 child   Patient is right handed   Caffeine consumption is 0    Past Surgical History  Procedure Laterality Date  . Carpal tunnel release    . Abdominal hysterectomy    . Replacement total knee bilateral    . Rotator cuff repair    . Kidney stone surgery    . Trigger finger release  2010    right  . Knee surgery    . Vesicovaginal fistula closure w/ tah    . Dilation and curettage  of uterus    . Hand surgery  06/2011    2 surgeries on left hand  . Wrist surgery  10/2011    right wrist    Family History  Problem Relation Age of Onset  . Stroke Mother   . Cancer Mother 67    bone cancer  . Colon cancer Neg Hx   . Esophageal cancer Neg Hx   . Rectal cancer Neg Hx   . Stomach cancer Neg Hx   . Diabetes Sister     Allergies  Allergen Reactions  . Erythromycin     REACTION: chest pain    Current Outpatient Prescriptions on File Prior to Visit  Medication Sig Dispense Refill  . Cholecalciferol (VITAMIN D-3 PO) Take 200 Units by mouth.      . cyanocobalamin 1000 MCG tablet Take 100 mcg by mouth daily.       Marland Kitchen estradiol (MENOSTAR) 14 MCG/24HR Place 1 patch onto the skin. 2 times a week      . hydrochlorothiazide (HYDRODIURIL) 25 MG tablet TAKE ONE TABLET BY MOUTH ONCE DAILY  90 tablet  1  . ibuprofen (  ADVIL,MOTRIN) 200 MG tablet Take 200 mg by mouth every 6 (six) hours as needed.        Marland Kitchen Ketamine HCl POWD Three times a day.      Marland Kitchen L-Methylfolate-Algae-B12-B6 (METANX) 3-90.314-2-35 MG CAPS Take one capsule by mouth two times daily  180 capsule  0  . lisinopril (PRINIVIL,ZESTRIL) 10 MG tablet TAKE ONE TABLET BY MOUTH ONCE DAILY  90 tablet  1  . Multiple Vitamin (MULTIVITAMIN) tablet Take 1 tablet by mouth daily.        . nortriptyline (PAMELOR) 25 MG capsule Take 3 capsules (75 mg total) by mouth at bedtime.  90 capsule  6  . pregabalin (LYRICA) 100 MG capsule Take 1 capsule (100 mg total) by mouth 2 (two) times daily.  180 capsule  1  . traMADol (ULTRAM) 50 MG tablet Take one to two tablets by mouth every 6 hours if needed for pain  180 tablet  1  . [DISCONTINUED] gabapentin (NEURONTIN) 300 MG capsule Take 300 mg by mouth 3 (three) times daily.         No current facility-administered medications on file prior to visit.    BP 119/82  Pulse 112  Temp(Src) 98.3 F (36.8 C) (Oral)  Resp 20  Ht 5' 5.75" (1.67 m)  Wt 231 lb (104.781 kg)  BMI 37.57 kg/m2   SpO2 99%     Review of Systems  Constitutional: Negative.   HENT: Negative for congestion, dental problem, hearing loss, rhinorrhea, sinus pressure, sore throat and tinnitus.   Eyes: Negative for pain, discharge and visual disturbance.  Respiratory: Negative for cough and shortness of breath.   Cardiovascular: Negative for chest pain, palpitations and leg swelling.  Gastrointestinal: Negative for nausea, vomiting, abdominal pain, diarrhea, constipation, blood in stool and abdominal distention.  Genitourinary: Negative for dysuria, urgency, frequency, hematuria, flank pain, vaginal bleeding, vaginal discharge, difficulty urinating, vaginal pain and pelvic pain.  Musculoskeletal: Positive for back pain. Negative for arthralgias, gait problem and joint swelling.  Skin: Negative for rash.  Neurological: Negative for dizziness, syncope, speech difficulty, weakness, numbness and headaches.  Hematological: Negative for adenopathy.  Psychiatric/Behavioral: Negative for behavioral problems, dysphoric mood and agitation. The patient is not nervous/anxious.        Objective:   Physical Exam  Constitutional: She appears well-developed and well-nourished. No distress.  Musculoskeletal:  Trace peripheral edema Negative straight leg test  Patellar and Achilles reflexes absent  No motor weakness.  Able walk on toes and heels  Status post bilateral total knee replacement surgeries          Assessment & Plan:   Chronic low back pain.  Probably secondary to multilevel degenerative changes.  No evidence of a radiculopathy.  Will observe at the present time and continue symptomatic treatment.  Weight loss encouraged Hypertension stable Peripheral neuropathy  Schedule CPX 4 months

## 2014-01-22 ENCOUNTER — Telehealth: Payer: Self-pay | Admitting: Nurse Practitioner

## 2014-01-22 MED ORDER — NORTRIPTYLINE HCL 25 MG PO CAPS: 75.0000 mg | ORAL_CAPSULE | Freq: Every day | ORAL | Status: DC

## 2014-01-22 NOTE — Telephone Encounter (Signed)
Will refill med

## 2014-01-24 ENCOUNTER — Telehealth: Payer: Self-pay | Admitting: Nurse Practitioner

## 2014-01-24 NOTE — Telephone Encounter (Signed)
Katherine [name] please call the patient to let her know about the PA. Thanks

## 2014-01-24 NOTE — Telephone Encounter (Signed)
We previously contacted ins regarding prior auth, please see note from 08/25.  I called the patient back.  Relayed info provided by ins.  She verbalized understanding.  She will ask her pharmacy to contact them.

## 2014-01-24 NOTE — Telephone Encounter (Signed)
Patient calling to state that her insurance won't pay for her compound medication anymore and that a letter must be sent to her insurance company justifying why she needs to take the compound. Please return call and advise.

## 2014-01-24 NOTE — Telephone Encounter (Signed)
Spoke with patient, she states that her insurance will not cover Ketamine HCL powder, patient recent visit on 01/03/14 does not have this medication listed, but it was listed on office visit from 12/29/12.

## 2014-02-01 ENCOUNTER — Telehealth: Payer: Self-pay | Admitting: Neurology

## 2014-02-01 NOTE — Telephone Encounter (Signed)
Patient calling to state that she received a letter about her compound medication not being approved by insurance, please return call and advise.

## 2014-02-02 ENCOUNTER — Telehealth: Payer: Self-pay | Admitting: Nurse Practitioner

## 2014-02-02 NOTE — Telephone Encounter (Signed)
Pt stop by the office and wanted to know if her compound medication could be changed to Lidocaine-Prilocaine ointment 2.5-2.5 %, because that's what her insurance will approve thur [facility] and pt states that she also needs refills on tramadol, nortriptyline and lyrica. Please advise

## 2014-02-05 ENCOUNTER — Telehealth: Payer: Self-pay

## 2014-02-05 MED ORDER — LIDOCAINE-PRILOCAINE (BULK) 2.5-2.5 % CREA: 1.0000 "application " | TOPICAL_CREAM | Freq: Every day | Status: DC | PRN

## 2014-02-05 MED ORDER — NORTRIPTYLINE HCL 25 MG PO CAPS: 75.0000 mg | ORAL_CAPSULE | Freq: Every day | ORAL | Status: DC

## 2014-02-05 NOTE — Telephone Encounter (Signed)
Patient returning call to [facility], please call and advise.

## 2014-02-05 NOTE — Telephone Encounter (Signed)
Please make sure she is taking Metanx and Pamelor. Ok for Lidocaine

## 2014-02-05 NOTE — Telephone Encounter (Signed)
Patient would like to know if Jaedan would prescribe Lidocaine-Prilocaine 2.5%-2.5%.  She was previously prescribed the Standard Neuropathic Compound Cream in Oct 2013.  Her insurance has changed, and will no longer cover compounded prescriptions, but patient indicates they will cover this drug.  Please advise.  Thank you.

## 2014-02-05 NOTE — Telephone Encounter (Signed)
I have been out of the office since 09/21.  This encounter was closed when it was sent to me.  The refills requested in this note have already been sent, Tramadol and Lyrica at [facility], and Nortriptyline was also sent by [facility] on a separate date noted in chart.  I called the patient back.  Got no answer.  Left message. I will send a message to the provider asking if they'd like to prescribe the Lidocaine/Prilocaine Oint.

## 2014-02-05 NOTE — Addendum Note (Signed)
Addended by: Norva Pavlov C on: 02/05/2014 02:47 PM   Modules accepted: Orders

## 2014-02-05 NOTE — Telephone Encounter (Signed)
I called back and spoke with the patient who said she would like the Cream sent to [facility] rather than [facility].  Rx has been resent.  She is aware other Rx's were sent to [facility] as well.  Says when she spoke with them they told her it can take them a "couple weeks" to look at their faxes.  I asked her if she's like a small supply of meds sent to [facility] while waiting on mail order, but she declined.

## 2014-02-05 NOTE — Telephone Encounter (Signed)
Patient verified she is taking Metanx and Pamelor

## 2014-02-05 NOTE — Telephone Encounter (Signed)
I called back, got no answer.  Left message.  

## 2014-02-06 NOTE — Telephone Encounter (Signed)
I have been out of the office since 09/21.  Message was forwarded to me today.    Please see previous note.  Med has been changed; a new Rx was sent.

## 2014-02-06 NOTE — Telephone Encounter (Signed)
Please advise. Thanks.  

## 2014-03-12 ENCOUNTER — Encounter: Payer: Self-pay | Admitting: Internal Medicine

## 2014-03-22 ENCOUNTER — Other Ambulatory Visit: Payer: Self-pay | Admitting: Neurology

## 2014-05-08 ENCOUNTER — Other Ambulatory Visit: Payer: Self-pay | Admitting: *Deleted

## 2014-05-08 MED ORDER — HYDROCHLOROTHIAZIDE 25 MG PO TABS: 25.0000 mg | ORAL_TABLET | Freq: Every day | ORAL | Status: DC

## 2014-05-08 MED ORDER — LISINOPRIL 10 MG PO TABS: 10.0000 mg | ORAL_TABLET | Freq: Every day | ORAL | Status: DC

## 2014-05-22 ENCOUNTER — Other Ambulatory Visit (INDEPENDENT_AMBULATORY_CARE_PROVIDER_SITE_OTHER): Payer: Medicare Other

## 2014-05-22 ENCOUNTER — Other Ambulatory Visit: Payer: 59

## 2014-05-22 DIAGNOSIS — I10 Essential (primary) hypertension: Secondary | ICD-10-CM

## 2014-05-22 DIAGNOSIS — Z0000 Encounter for general adult medical examination without abnormal findings: Secondary | ICD-10-CM

## 2014-05-22 DIAGNOSIS — E785 Hyperlipidemia, unspecified: Secondary | ICD-10-CM

## 2014-05-22 LAB — LIPID PANEL
CHOL/HDL RATIO: 5
Cholesterol: 201 mg/dL — ABNORMAL HIGH (ref 0–200)
HDL: 40.5 mg/dL (ref >39.00)
LDL CALC: 130 mg/dL — AB (ref 0–99)
NONHDL: 160.5
Triglycerides: 155 mg/dL — ABNORMAL HIGH (ref 0.0–149.0)
VLDL: 31 mg/dL (ref 0.0–40.0)

## 2014-05-22 LAB — COMPREHENSIVE METABOLIC PANEL
ALBUMIN: 3.9 g/dL (ref 3.5–5.2)
ALK PHOS: 65 U/L (ref 39–117)
ALT: 33 U/L (ref 0–35)
AST: 58 U/L — ABNORMAL HIGH (ref 0–37)
BUN: 10 mg/dL (ref 6–23)
CALCIUM: 10.8 mg/dL — AB (ref 8.4–10.5)
CHLORIDE: 102 meq/L (ref 96–112)
CO2: 28 meq/L (ref 19–32)
Creatinine, Ser: 0.7 mg/dL (ref 0.4–1.2)
GFR: 102.86 mL/min (ref >60.00)
GLUCOSE: 106 mg/dL — AB (ref 70–99)
Potassium: 3.9 mEq/L (ref 3.5–5.1)
SODIUM: 134 meq/L — AB (ref 135–145)
TOTAL PROTEIN: 7.1 g/dL (ref 6.0–8.3)
Total Bilirubin: 0.8 mg/dL (ref 0.2–1.2)

## 2014-05-22 LAB — POCT URINALYSIS DIPSTICK
BILIRUBIN UA: NEGATIVE
GLUCOSE UA: NEGATIVE
Ketones, UA: NEGATIVE
Leukocytes, UA: NEGATIVE
Nitrite, UA: NEGATIVE
Protein, UA: NEGATIVE
RBC UA: NEGATIVE
SPEC GRAV UA: 1.015
Urobilinogen, UA: 0.2
pH, UA: 7

## 2014-05-22 LAB — CBC WITH DIFFERENTIAL/PLATELET
Basophils Absolute: 0 10*3/uL (ref 0.0–0.1)
Basophils Relative: 0.5 % (ref 0.0–3.0)
EOS ABS: 0.1 10*3/uL (ref 0.0–0.7)
Eosinophils Relative: 2 % (ref 0.0–5.0)
HCT: 40.4 % (ref 36.0–46.0)
Hemoglobin: 13.4 g/dL (ref 12.0–15.0)
Lymphocytes Relative: 42.9 % (ref 12.0–46.0)
Lymphs Abs: 1.5 10*3/uL (ref 0.7–4.0)
MCHC: 33.3 g/dL (ref 30.0–36.0)
MCV: 91.1 fl (ref 78.0–100.0)
MONO ABS: 0.7 10*3/uL (ref 0.1–1.0)
Monocytes Relative: 21.3 % — ABNORMAL HIGH (ref 3.0–12.0)
Neutro Abs: 1.2 10*3/uL — ABNORMAL LOW (ref 1.4–7.7)
PLATELETS: 167 10*3/uL (ref 150.0–400.0)
RBC: 4.43 Mil/uL (ref 3.87–5.11)
RDW: 14 % (ref 11.5–15.5)
WBC: 3.5 10*3/uL — ABNORMAL LOW (ref 4.0–10.5)

## 2014-05-22 LAB — TSH: TSH: 1.15 u[IU]/mL (ref 0.35–4.50)

## 2014-05-29 ENCOUNTER — Ambulatory Visit (INDEPENDENT_AMBULATORY_CARE_PROVIDER_SITE_OTHER): Payer: Medicare Other | Admitting: Internal Medicine

## 2014-05-29 ENCOUNTER — Encounter: Payer: Self-pay | Admitting: Internal Medicine

## 2014-05-29 VITALS — BP 120/70 | HR 100 | Temp 98.1°F | Resp 20 | Ht 65.0 in | Wt 231.0 lb

## 2014-05-29 DIAGNOSIS — Z23 Encounter for immunization: Secondary | ICD-10-CM | POA: Diagnosis not present

## 2014-05-29 DIAGNOSIS — Z0000 Encounter for general adult medical examination without abnormal findings: Secondary | ICD-10-CM

## 2014-05-29 DIAGNOSIS — E213 Hyperparathyroidism, unspecified: Secondary | ICD-10-CM

## 2014-05-29 DIAGNOSIS — I10 Essential (primary) hypertension: Secondary | ICD-10-CM | POA: Diagnosis not present

## 2014-05-29 DIAGNOSIS — E785 Hyperlipidemia, unspecified: Secondary | ICD-10-CM | POA: Diagnosis not present

## 2014-05-29 DIAGNOSIS — G609 Hereditary and idiopathic neuropathy, unspecified: Secondary | ICD-10-CM

## 2014-05-29 MED ORDER — LISINOPRIL 10 MG PO TABS: 10.0000 mg | ORAL_TABLET | Freq: Every day | ORAL | Status: DC

## 2014-05-29 MED ORDER — HYDROCHLOROTHIAZIDE 25 MG PO TABS: 25.0000 mg | ORAL_TABLET | Freq: Every day | ORAL | Status: DC

## 2014-05-29 NOTE — Progress Notes (Signed)
Pre visit review using our clinic review tool, if applicable. No additional management support is needed unless otherwise documented below in the visit note. 

## 2014-05-29 NOTE — Progress Notes (Signed)
Subjective:    Patient ID: Katherine [name], female    DOB: 1951-01-26, 64 y.o.   MRN: [id number]  HPI  64 year old patient who is seen today for a preventive health examination.  Medical problems include peripheral neuropathy.  She has been seen by neurology and has been treated aggressively for symptomatic pain and numbness involving her hands and feet She has a history of hypertension which has been well controlled Additionally, she has a history of primary hyperparathyroidism.  Hypertension has been treated with thiazide diuretics.  She did have a bone density study approximately 3 and a half years ago which was normal She is followed annually by gynecology and if no recent bone density will obtain another. Colonoscopy 2013, which was unremarkable.  10 year interval recommended  Family history. He tells the father's health unknown Mother died age 102.  Metastatic bone disease 3 brothers, one deceased from complications of hepatitis C 5 sisters Positive for hypertension.  2 sisters with a peripheral neuropathy, hypothyroidism and asthma  Social history single, 1 daughter age 31 3 grandchildren  Past Medical History  Diagnosis Date  . Hyperlipidemia   . Chronic kidney disease     kidney stones  . Arthritis   . Hypertension   . Diverticulosis of colon   . Elevated LFTs     neg for hep b and c in 2006  . Kidney infection   . History of mumps   . Yeast infection   . Bacterial infection   . Trichomonas   . Pelvic pain in female 10/27/10    History   Social History  . Marital Status: Single    Spouse Name: N/A    Number of Children: 1  . Years of Education: N/A   Occupational History  .     Social History Main Topics  . Smoking status: Never Smoker   . Smokeless tobacco: Never Used  . Alcohol Use: No  . Drug Use: No  . Sexual Activity: No   Other Topics Concern  . Not on file   Social History Narrative   Patient is single, has 1 child   Patient is right  handed   Caffeine consumption is 0    Past Surgical History  Procedure Laterality Date  . Carpal tunnel release    . Abdominal hysterectomy    . Replacement total knee bilateral    . Rotator cuff repair    . Kidney stone surgery    . Trigger finger release  2010    right  . Knee surgery    . Vesicovaginal fistula closure w/ tah    . Dilation and curettage of uterus    . Hand surgery  06/2011    2 surgeries on left hand  . Wrist surgery  10/2011    right wrist    Family History  Problem Relation Age of Onset  . Stroke Mother   . Cancer Mother 68    bone cancer  . Colon cancer Neg Hx   . Esophageal cancer Neg Hx   . Rectal cancer Neg Hx   . Stomach cancer Neg Hx   . Diabetes Sister     Allergies  Allergen Reactions  . Erythromycin     REACTION: chest pain    Current Outpatient Prescriptions on File Prior to Visit  Medication Sig Dispense Refill  . Cholecalciferol (VITAMIN D-3 PO) Take 2,000 Units by mouth.     . cyanocobalamin 1000 MCG tablet Take 1,000 mcg by mouth  daily.     . estradiol (MENOSTAR) 14 MCG/24HR Place 1 patch onto the skin once a week.     Marland Kitchen ibuprofen (ADVIL,MOTRIN) 200 MG tablet Take 200 mg by mouth every 6 (six) hours as needed.      Marland Kitchen L-Methylfolate-Algae-B12-B6 (METANX) 3-90.314-2-35 MG CAPS Take one capsule by mouth two times daily 180 capsule 3  . Lidocaine-Prilocaine, Bulk, 2.5-2.5 % CREA Apply 1-2 application topically daily as needed (to the affected area). 6 Bottle 1  . Multiple Vitamin (MULTIVITAMIN) tablet Take 1 tablet by mouth daily.      . nortriptyline (PAMELOR) 25 MG capsule Take 3 capsules (75 mg total) by mouth at bedtime. 270 capsule 1  . pregabalin (LYRICA) 100 MG capsule Take 1 capsule (100 mg total) by mouth 2 (two) times daily. 180 capsule 1  . traMADol (ULTRAM) 50 MG tablet Take one to two tablets by mouth every 6 hours if needed for pain (Patient taking differently: Take one to two tablets by mouth every 6 hours if needed for  pain, Prescribed by Neurology) 180 tablet 1  . [DISCONTINUED] gabapentin (NEURONTIN) 300 MG capsule Take 300 mg by mouth 3 (three) times daily.       No current facility-administered medications on file prior to visit.    BP 120/70 mmHg  Pulse 100  Temp(Src) 98.1 F (36.7 C) (Oral)  Resp 20  Ht 5\' 5"  (1.651 m)  Wt 231 lb (104.781 kg)  BMI 38.44 kg/m2  SpO2 96%  1. Risk factors, based on past  M,S,F history.  Cardiovascular risk factors include hypertension and dyslipidemia  2.  Physical activities: Somewhat limited due to arthritis.  She has had bilateral total knee replacement surgeries.  She has a significant peripheral neuropathy  3.  Depression/mood: No history of major depression or mood disorder  4.  Hearing: No deficits  5.  ADL's: Independent  6.  Fall risk: Moderate due to peripheral neuropathy and obesity. 7.  Home safety: No problems identified  8.  Height weight, and visual acuity; no change in weight or visual acuity  9.  Counseling: Heart healthy diet recommended more regular exercise.  Modest weight loss.  All encouraged  10. Lab orders based on risk factors: Laboratory profile reviewed  11. Referral : Follow-up endocrine and GYN in neurology  12. Care plan: Serial calcium levels will be reviewed.  Hydrochlorothiazide discontinued  13. Cognitive assessment: Alert and oriented with normal affect no cognitive dysfunction  14. Screening: We'll continue to have annual health examinations with screening lab was special attention to serum calcium .  Patient was provided with a written and personalized care plan  15. Provider List Update: , includes primary care neurology OB/GYN in endocrinology     Review of Systems  Constitutional: Negative for fever, appetite change, fatigue and unexpected weight change.  HENT: Negative for congestion, dental problem, ear pain, hearing loss, mouth sores, nosebleeds, sinus pressure, sore throat, tinnitus, trouble swallowing  and voice change.   Eyes: Negative for photophobia, pain, redness and visual disturbance.  Respiratory: Negative for cough, chest tightness and shortness of breath.   Cardiovascular: Negative for chest pain, palpitations and leg swelling.  Gastrointestinal: Negative for nausea, vomiting, abdominal pain, diarrhea, constipation, blood in stool, abdominal distention and rectal pain.  Genitourinary: Negative for dysuria, urgency, frequency, hematuria, flank pain, vaginal bleeding, vaginal discharge, difficulty urinating, genital sores, vaginal pain, menstrual problem and pelvic pain.  Musculoskeletal: Negative for back pain, arthralgias and neck stiffness.  Skin: Negative for  rash.  Neurological: Negative for dizziness, syncope, speech difficulty, weakness, light-headedness, numbness and headaches.  Hematological: Negative for adenopathy. Does not bruise/bleed easily.  Psychiatric/Behavioral: Negative for suicidal ideas, behavioral problems, self-injury, dysphoric mood and agitation. The patient is not nervous/anxious.        Objective:   Physical Exam  Constitutional: She is oriented to person, place, and time. She appears well-developed and well-nourished.  Obese Blood pressure 100/70  HENT:  Head: Normocephalic and atraumatic.  Right Ear: External ear normal.  Left Ear: External ear normal.  Mouth/Throat: Oropharynx is clear and moist.  Eyes: Conjunctivae and EOM are normal.  Neck: Normal range of motion. Neck supple. No JVD present. No thyromegaly present.  Cardiovascular: Normal rate, regular rhythm, normal heart sounds and intact distal pulses.   No murmur heard. Pulmonary/Chest: Effort normal and breath sounds normal. She has no wheezes. She has no rales.  Abdominal: Soft. Bowel sounds are normal. She exhibits no distension and no mass. There is no tenderness. There is no rebound and no guarding.  Musculoskeletal: Normal range of motion. She exhibits no edema or tenderness.    Neurological: She is alert and oriented to person, place, and time. She has normal reflexes. No cranial nerve deficit. She exhibits normal muscle tone. Coordination normal.  Decreased vibratory sensation distally  Skin: Skin is warm and dry. No rash noted.  Psychiatric: She has a normal mood and affect. Her behavior is normal.          Assessment & Plan:  Preventive health care Hypertension.  Blood pressure is in a low-normal range.  Hydrochlorothiazide will be discontinued due to hypercalcemia Primary hyperparathyroidism.  We'll continue to track calcium levels HCTZ discontinued Menopausal syndrome Peripheral neuropathy.  Follow-up neurology  Recheck 6 months

## 2014-05-29 NOTE — Patient Instructions (Signed)
Limit your sodium (Salt) intake    It is important that you exercise regularly, at least 20 minutes 3 to 4 times per week.  If you develop chest pain or shortness of breath seek  medical attention.  You need to lose weight.  Consider a lower calorie diet and regular exercise.  Health Maintenance Adopting a healthy lifestyle and getting preventive care can go a long way to promote health and wellness. Talk with your health care provider about what schedule of regular examinations is right for you. This is a good chance for you to check in with your provider about disease prevention and staying healthy. In between checkups, there are plenty of things you can do on your own. Experts have done a lot of research about which lifestyle changes and preventive measures are most likely to keep you healthy. Ask your health care provider for more information. WEIGHT AND DIET  Eat a healthy diet  Be sure to include plenty of vegetables, fruits, low-fat dairy products, and lean protein.  Do not eat a lot of foods high in solid fats, added sugars, or salt.  Get regular exercise. This is one of the most important things you can do for your health.  Most adults should exercise for at least 150 minutes each week. The exercise should increase your heart rate and make you sweat (moderate-intensity exercise).  Most adults should also do strengthening exercises at least twice a week. This is in addition to the moderate-intensity exercise.  Maintain a healthy weight  Body mass index (BMI) is a measurement that can be used to identify possible weight problems. It estimates body fat based on height and weight. Your health care provider can help determine your BMI and help you achieve or maintain a healthy weight.  For females 20 years of age and older:   A BMI below 18.5 is considered underweight.  A BMI of 18.5 to 24.9 is normal.  A BMI of 25 to 29.9 is considered overweight.  A BMI of 30 and above is  considered obese.  Watch levels of cholesterol and blood lipids  You should start having your blood tested for lipids and cholesterol at 66 years of age, then have this test every 5 years.  You may need to have your cholesterol levels checked more often if:  Your lipid or cholesterol levels are high.  You are older than 66 years of age.  You are at high risk for heart disease.  CANCER SCREENING   Lung Cancer  Lung cancer screening is recommended for adults 55-80 years old who are at high risk for lung cancer because of a history of smoking.  A yearly low-dose CT scan of the lungs is recommended for people who:  Currently smoke.  Have quit within the past 15 years.  Have at least a 30-pack-year history of smoking. A pack year is smoking an average of one pack of cigarettes a day for 1 year.  Yearly screening should continue until it has been 15 years since you quit.  Yearly screening should stop if you develop a health problem that would prevent you from having lung cancer treatment.  Breast Cancer  Practice breast self-awareness. This means understanding how your breasts normally appear and feel.  It also means doing regular breast self-exams. Let your health care provider know about any changes, no matter how small.  If you are in your 20s or 30s, you should have a clinical breast exam (CBE) by a health care   provider every 1-3 years as part of a regular health exam.  If you are 57 or older, have a CBE every year. Also consider having a breast X-ray (mammogram) every year.  If you have a family history of breast cancer, talk to your health care provider about genetic screening.  If you are at high risk for breast cancer, talk to your health care provider about having an MRI and a mammogram every year.  Breast cancer gene (BRCA) assessment is recommended for women who have family members with BRCA-related cancers. BRCA-related cancers  include:  Breast.  Ovarian.  Tubal.  Peritoneal cancers.  Results of the assessment will determine the need for genetic counseling and BRCA1 and BRCA2 testing. Cervical Cancer Routine pelvic examinations to screen for cervical cancer are no longer recommended for nonpregnant women who are considered low risk for cancer of the pelvic organs (ovaries, uterus, and vagina) and who do not have symptoms. A pelvic examination may be necessary if you have symptoms including those associated with pelvic infections. Ask your health care provider if a screening pelvic exam is right for you.   The Pap test is the screening test for cervical cancer for women who are considered at risk.  If you had a hysterectomy for a problem that was not cancer or a condition that could lead to cancer, then you no longer need Pap tests.  If you are older than 65 years, and you have had normal Pap tests for the past 10 years, you no longer need to have Pap tests.  If you have had past treatment for cervical cancer or a condition that could lead to cancer, you need Pap tests and screening for cancer for at least 20 years after your treatment.  If you no longer get a Pap test, assess your risk factors if they change (such as having a new sexual partner). This can affect whether you should start being screened again.  Some women have medical problems that increase their chance of getting cervical cancer. If this is the case for you, your health care provider may recommend more frequent screening and Pap tests.  The human papillomavirus (HPV) test is another test that may be used for cervical cancer screening. The HPV test looks for the virus that can cause cell changes in the cervix. The cells collected during the Pap test can be tested for HPV.  The HPV test can be used to screen women 24 years of age and older. Getting tested for HPV can extend the interval between normal Pap tests from three to five years.  An HPV  test also should be used to screen women of any age who have unclear Pap test results.  After 66 years of age, women should have HPV testing as often as Pap tests.  Colorectal Cancer  This type of cancer can be detected and often prevented.  Routine colorectal cancer screening usually begins at 66 years of age and continues through 66 years of age.  Your health care provider may recommend screening at an earlier age if you have risk factors for colon cancer.  Your health care provider may also recommend using home test kits to check for hidden blood in the stool.  A small camera at the end of a tube can be used to examine your colon directly (sigmoidoscopy or colonoscopy). This is done to check for the earliest forms of colorectal cancer.  Routine screening usually begins at age 22.  Direct examination of the colon  should be repeated every 5-10 years through 66 years of age. However, you may need to be screened more often if early forms of precancerous polyps or small growths are found. Skin Cancer  Check your skin from head to toe regularly.  Tell your health care provider about any new moles or changes in moles, especially if there is a change in a mole's shape or color.  Also tell your health care provider if you have a mole that is larger than the size of a pencil eraser.  Always use sunscreen. Apply sunscreen liberally and repeatedly throughout the day.  Protect yourself by wearing long sleeves, pants, a wide-brimmed hat, and sunglasses whenever you are outside. HEART DISEASE, DIABETES, AND HIGH BLOOD PRESSURE   Have your blood pressure checked at least every 1-2 years. High blood pressure causes heart disease and increases the risk of stroke.  If you are between 55 years and 79 years old, ask your health care provider if you should take aspirin to prevent strokes.  Have regular diabetes screenings. This involves taking a blood sample to check your fasting blood sugar  level.  If you are at a normal weight and have a low risk for diabetes, have this test once every three years after 66 years of age.  If you are overweight and have a high risk for diabetes, consider being tested at a younger age or more often. PREVENTING INFECTION  Hepatitis B  If you have a higher risk for hepatitis B, you should be screened for this virus. You are considered at high risk for hepatitis B if:  You were born in a country where hepatitis B is common. Ask your health care provider which countries are considered high risk.  Your parents were born in a high-risk country, and you have not been immunized against hepatitis B (hepatitis B vaccine).  You have HIV or AIDS.  You use needles to inject street drugs.  You live with someone who has hepatitis B.  You have had sex with someone who has hepatitis B.  You get hemodialysis treatment.  You take certain medicines for conditions, including cancer, organ transplantation, and autoimmune conditions. Hepatitis C  Blood testing is recommended for:  Everyone born from 1945 through 1965.  Anyone with known risk factors for hepatitis C. Sexually transmitted infections (STIs)  You should be screened for sexually transmitted infections (STIs) including gonorrhea and chlamydia if:  You are sexually active and are younger than 66 years of age.  You are older than 66 years of age and your health care provider tells you that you are at risk for this type of infection.  Your sexual activity has changed since you were last screened and you are at an increased risk for chlamydia or gonorrhea. Ask your health care provider if you are at risk.  If you do not have HIV, but are at risk, it may be recommended that you take a prescription medicine daily to prevent HIV infection. This is called pre-exposure prophylaxis (PrEP). You are considered at risk if:  You are sexually active and do not regularly use condoms or know the HIV status  of your partner(s).  You take drugs by injection.  You are sexually active with a partner who has HIV. Talk with your health care provider about whether you are at high risk of being infected with HIV. If you choose to begin PrEP, you should first be tested for HIV. You should then be tested every 3 months for   as long as you are taking PrEP.  PREGNANCY   If you are premenopausal and you may become pregnant, ask your health care provider about preconception counseling.  If you may become pregnant, take 400 to 800 micrograms (mcg) of folic acid every day.  If you want to prevent pregnancy, talk to your health care provider about birth control (contraception). OSTEOPOROSIS AND MENOPAUSE   Osteoporosis is a disease in which the bones lose minerals and strength with aging. This can result in serious bone fractures. Your risk for osteoporosis can be identified using a bone density scan.  If you are 65 years of age or older, or if you are at risk for osteoporosis and fractures, ask your health care provider if you should be screened.  Ask your health care provider whether you should take a calcium or vitamin D supplement to lower your risk for osteoporosis.  Menopause may have certain physical symptoms and risks.  Hormone replacement therapy may reduce some of these symptoms and risks. Talk to your health care provider about whether hormone replacement therapy is right for you.  HOME CARE INSTRUCTIONS   Schedule regular health, dental, and eye exams.  Stay current with your immunizations.   Do not use any tobacco products including cigarettes, chewing tobacco, or electronic cigarettes.  If you are pregnant, do not drink alcohol.  If you are breastfeeding, limit how much and how often you drink alcohol.  Limit alcohol intake to no more than 1 drink per day for nonpregnant women. One drink equals 12 ounces of beer, 5 ounces of wine, or 1 ounces of hard liquor.  Do not use street  drugs.  Do not share needles.  Ask your health care provider for help if you need support or information about quitting drugs.  Tell your health care provider if you often feel depressed.  Tell your health care provider if you have ever been abused or do not feel safe at home. Document Released: 11/10/2010 Document Revised: 09/11/2013 Document Reviewed: 03/29/2013 Exit[employer] Patient Information 2015 [employer]. This information is not intended to replace advice given to you by your health care provider. Make sure you discuss any questions you have with your health care provider.  

## 2014-05-30 ENCOUNTER — Telehealth: Payer: Self-pay | Admitting: Internal Medicine

## 2014-05-30 NOTE — Telephone Encounter (Signed)
emmi emailed °

## 2014-05-31 DIAGNOSIS — Z23 Encounter for immunization: Secondary | ICD-10-CM | POA: Diagnosis not present

## 2014-07-06 ENCOUNTER — Ambulatory Visit (INDEPENDENT_AMBULATORY_CARE_PROVIDER_SITE_OTHER): Payer: 59 | Admitting: Nurse Practitioner

## 2014-07-06 ENCOUNTER — Encounter: Payer: Self-pay | Admitting: Nurse Practitioner

## 2014-07-06 ENCOUNTER — Ambulatory Visit: Payer: 59 | Admitting: Nurse Practitioner

## 2014-07-06 VITALS — BP 124/80 | HR 103 | Temp 97.0°F | Ht 65.0 in | Wt 238.0 lb

## 2014-07-06 DIAGNOSIS — G609 Hereditary and idiopathic neuropathy, unspecified: Secondary | ICD-10-CM

## 2014-07-06 MED ORDER — LIDOCAINE-PRILOCAINE (BULK) 2.5-2.5 % CREA: 1.0000 "application " | TOPICAL_CREAM | Freq: Every day | Status: DC | PRN

## 2014-07-06 MED ORDER — NORTRIPTYLINE HCL 25 MG PO CAPS: 75.0000 mg | ORAL_CAPSULE | Freq: Every day | ORAL | Status: DC

## 2014-07-06 MED ORDER — PREGABALIN 100 MG PO CAPS: 100.0000 mg | ORAL_CAPSULE | Freq: Two times a day (BID) | ORAL | Status: DC

## 2014-07-06 NOTE — Progress Notes (Signed)
I have reviewed and agreed above plan. 

## 2014-07-06 NOTE — Patient Instructions (Addendum)
Continue nortriptyline at current dose Continue Lyrica at current dose Continue lidocaine cream refill Continue Metanx twice daily Continue exercise program Follow-up in 6-8 months

## 2014-07-06 NOTE — Progress Notes (Signed)
[facility]  PATIENT: Arron Mcnaught DOB: 1950-10-22   REASON FOR VISIT: Follow-up for peripheral neuropathy HISTORY FROM: Patient    HISTORY OF PRESENT ILLNESS:Ms. Jimmye Norman, 64 year old female returns for followup. She was last seen in this office 01/03/14. 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 She has recently experienced similar symptoms in her hands as well.. She is also on Lidocaine cream.  She also takes occasional Ultram She continues to exercise 5 times a week . She returns for reevaluation . She needs refills on several of her medications   HISTORY: She has been followed here since April 2010 for sensory neuropathy, with numbness and pain in the feet and consistent NCV findings. Exam demonstrated mild sensory loss in the feet, worse on the right, and diminished ankle jerks. Neuropathy labs were unremarkable, except for a slightly elevated HbA1c of 5.9. Advised to take a multivitamin. Gabapentin provided incomplete relief, and she had some difficulties with drowsiness. Nortriptyline was added at doses increasing to 50 mg q.h.s. She reports ongoing soreness of feet, not bad, but had 2 bouts of severe pain lasting 30-45 minutes, mostly in evenings; given tramadol.  12/29/12 Remains on Lyrica 173m BID and Nortriptyline 732mat night. . She continues to have occasional foot pain, She takes tramadol for pain. Transdermal cream was stopped due to insurance issues, she is back on that. She has stopped her Metanx insurance would not pay She is doing water aerobics 3 to 4 times weekly. She continues to retain fluid around the ankles.      REVIEW OF SYSTEMS: Full 14 system review of systems performed and notable only for those listed, all others are neg:  Constitutional: neg  Cardiovascular:  neg Ear/Nose/Throat: neg  Skin: neg Eyes: neg Respiratory: Wheezing  Gastroitestinal: neg  Hematology/Lymphatic: neg  Endocrine: Intolerance to cold Musculoskeletal: Joint pain, aching muscles  Allergy/Immunology: neg Neurological: Numbness Psychiatric: neg Sleep : neg   ALLERGIES: Allergies  Allergen Reactions  . Erythromycin     REACTION: chest pain    HOME MEDICATIONS: Outpatient Prescriptions Prior to Visit  Medication Sig Dispense Refill  . Cholecalciferol (VITAMIN D-3 PO) Take 2,000 Units by mouth.     . cyanocobalamin 1000 MCG tablet Take 1,000 mcg by mouth daily.     . Marland Kitchenstradiol (MENOSTAR) 14 MCG/24HR Place 1 patch onto the skin once a week.     . Marland Kitchenbuprofen (ADVIL,MOTRIN) 200 MG tablet Take 200 mg by mouth every 6 (six) hours as needed.      . Marland Kitchen-Methylfolate-Algae-B12-B6 (METANX) 3-90.314-2-35 MG CAPS Take one capsule by mouth two times daily 180 capsule 3  . Lidocaine-Prilocaine, Bulk, 2.5-2.5 % CREA Apply 1-2 application topically daily as needed (to the affected area). 6 Bottle 1  . lisinopril (PRINIVIL,ZESTRIL) 10 MG tablet Take 1 tablet (10 mg total) by mouth daily. 90 tablet 3  . Multiple Vitamin (MULTIVITAMIN) tablet Take 1 tablet by mouth daily.      . nortriptyline (PAMELOR) 25 MG capsule Take 3 capsules (75 mg total) by mouth at bedtime. 270 capsule 1  . pregabalin (LYRICA) 100 MG capsule Take 1 capsule (100 mg total) by mouth 2 (two) times daily. 180 capsule 1  . traMADol (ULTRAM) 50 MG tablet Take one to two tablets by mouth every 6 hours if needed for pain (Patient taking differently: Take one  to two tablets by mouth every 6 hours if needed for pain, Prescribed by Neurology) 180 tablet 1   No facility-administered medications prior to visit.    PAST MEDICAL HISTORY: Past Medical History  Diagnosis Date  . Hyperlipidemia   . Chronic kidney disease     kidney stones  . Arthritis   . Hypertension   . Diverticulosis of colon   . Elevated LFTs     neg  for hep b and c in 2006  . Kidney infection   . History of mumps   . Yeast infection   . Bacterial infection   . Trichomonas   . Pelvic pain in female 10/27/10    PAST SURGICAL HISTORY: Past Surgical History  Procedure Laterality Date  . Carpal tunnel release    . Abdominal hysterectomy    . Replacement total knee bilateral    . Rotator cuff repair    . Kidney stone surgery    . Trigger finger release  2010    right  . Knee surgery    . Vesicovaginal fistula closure w/ tah    . Dilation and curettage of uterus    . Hand surgery  06/2011    2 surgeries on left hand  . Wrist surgery  10/2011    right wrist    FAMILY HISTORY: Family History  Problem Relation Age of Onset  . Stroke Mother   . Cancer Mother 70    bone cancer  . Colon cancer Neg Hx   . Esophageal cancer Neg Hx   . Rectal cancer Neg Hx   . Stomach cancer Neg Hx   . Diabetes Sister     SOCIAL HISTORY: History   Social History  . Marital Status: Single    Spouse Name: N/A  . Number of Children: 1  . Years of Education: N/A   Occupational History  .     Social History Main Topics  . Smoking status: Never Smoker   . Smokeless tobacco: Never Used  . Alcohol Use: No  . Drug Use: No  . Sexual Activity: No   Other Topics Concern  . Not on file   Social History Narrative   Patient is single, has 1 child   Patient is right handed   Caffeine consumption is 0     PHYSICAL EXAM  Filed Vitals:   07/06/14 1050  BP: 124/80  Pulse: 103  Temp: 97 F (36.1 C)  TempSrc: Oral  Height: _0  (1.651 m)  Weight: 238 lb (107.956 kg)   Body mass index is 39.61 kg/(m^2).  Generalized: Well developed, in no acute distress  Head: normocephalic and atraumatic,. Oropharynx benign  Neck: Supple, no carotid bruits  Cardiac: Regular rate rhythm, no murmur  Musculoskeletal: No deformity   Neurological examination   Mentation: Alert oriented to time, place, history taking. Attention span and  concentration appropriate. Recent and remote memory intact.  Follows all commands speech and language fluent.   Cranial nerve II-XII: Fundoscopic exam reveals sharp disc margins.Pupils were equal round reactive to light extraocular movements were full, visual field were full on confrontational test. Facial sensation and strength were normal. hearing was intact to finger rubbing bilaterally. Uvula tongue midline. head turning and shoulder shrug were normal and symmetric.Tongue protrusion into cheek strength was normal. Motor: normal bulk and tone, full strength in the BUE, BLE, fine finger movements normal, no pronator drift. No focal weakness Sensory: normal and symmetric to light touch, Vibration, proprioception , decreased pinprick  in a stocking fashion to mid shin. Coordination: finger-nose-finger, heel-to-shin bilaterally, no dysmetria Reflexes: 1+ upper and lower and symmetric except absent ankle jerks plantar responses were flexor bilaterally. Gait and Station: Rising up from seated position without assistance, normal stance,  moderate stride, good arm swing, smooth turning, able to perform tiptoe, and heel walking without difficulty. Tandem gait is steady, no assistive device  DIAGNOSTIC DATA (LABS, IMAGING, TESTING) - I reviewed patient records, labs, notes, testing and imaging myself where available.  Lab Results  Component Value Date   WBC 3.5* 05/22/2014   HGB 13.4 05/22/2014   HCT 40.4 05/22/2014   MCV 91.1 05/22/2014   PLT 167.0 05/22/2014      Component Value Date/Time   NA 134* 05/22/2014 0859   K 3.9 05/22/2014 0859   CL 102 05/22/2014 0859   CO2 28 05/22/2014 0859   GLUCOSE 106* 05/22/2014 0859   BUN 10 05/22/2014 0859   CREATININE 0.7 05/22/2014 0859   CALCIUM 10.8* 05/22/2014 0859   CALCIUM 9.8 06/17/2009 2212   PROT 7.1 05/22/2014 0859   ALBUMIN 3.9 05/22/2014 0859   AST 58* 05/22/2014 0859   ALT 33 05/22/2014 0859   ALKPHOS 65 05/22/2014 0859   BILITOT 0.8  05/22/2014 0859   GFRNONAA 106.06 11/06/2009 0814   GFRAA  08/01/2008 1600    >60        The eGFR has been calculated using the MDRD equation. This calculation has not been validated in all clinical situations. eGFR's persistently <60 mL/min signify possible Chronic Kidney Disease.   Lab Results  Component Value Date   CHOL 201* 05/22/2014   HDL 40.50 05/22/2014   LDLCALC 130* 05/22/2014   LDLDIRECT 139.1 02/11/2011   TRIG 155.0* 05/22/2014   CHOLHDL 5 05/22/2014     Lab Results  Component Value Date   TSH 1.15 05/22/2014      ASSESSMENT AND PLAN  64 y.o. year old female  has a past medical history of peripheral neuropathy which is now extended into the hands. She is currently on Lyrica and nortriptyline Metanx and Lidoderm cream. She is continuing to exercise 5 times a week.  Continue nortriptyline at current dose Continue Lyrica at current dose Continue lidocaine cream refill Continue Metanx twice daily Continue exercise program Follow-up in 6-8 months Dennie Bible, [facility], [facility], APRN  [facility] [address], [place], [place] [address] [phone]

## 2014-07-31 ENCOUNTER — Telehealth: Payer: Self-pay | Admitting: Nurse Practitioner

## 2014-07-31 MED ORDER — LIDOCAINE-PRILOCAINE (BULK) 2.5-2.5 % CREA: 1.0000 "application " | TOPICAL_CREAM | Freq: Every day | Status: DC | PRN

## 2014-07-31 MED ORDER — NORTRIPTYLINE HCL 25 MG PO CAPS: 75.0000 mg | ORAL_CAPSULE | Freq: Every day | ORAL | Status: DC

## 2014-07-31 NOTE — Telephone Encounter (Signed)
Patient stated refills for Rx's nortriptyline (PAMELOR) 25 MG capsule and Lidocaine-Prilocaine, Bulk, 2.5-2.5 % CREA, need to be called into [facility].  Please call and advise.

## 2014-07-31 NOTE — Telephone Encounter (Signed)
Katherine [name] already sent these prescriptions to [facility] when the patient was here for OV last month.  I have resent the Rx's.  I called the patient back to advise.  She is aware.

## 2014-08-11 ENCOUNTER — Emergency Department (INDEPENDENT_AMBULATORY_CARE_PROVIDER_SITE_OTHER)
Admission: EM | Admit: 2014-08-11 | Discharge: 2014-08-11 | Disposition: A | Discharge status: Home / Self Care | Payer: Medicare Other | Source: Home / Self Care | Attending: Family Medicine | Admitting: Family Medicine

## 2014-08-11 ENCOUNTER — Encounter (HOSPITAL_COMMUNITY): Payer: Self-pay | Admitting: *Deleted

## 2014-08-11 DIAGNOSIS — N390 Urinary tract infection, site not specified: Secondary | ICD-10-CM

## 2014-08-11 MED ORDER — NITROFURANTOIN MONOHYD MACRO 100 MG PO CAPS: 100.0000 mg | ORAL_CAPSULE | Freq: Two times a day (BID) | ORAL | Status: DC

## 2014-08-11 NOTE — Discharge Instructions (Signed)
Urinary Tract Infection Urinary tract infections (UTIs) can develop anywhere along your urinary tract. Your urinary tract is your body's drainage system for removing wastes and extra water. Your urinary tract includes two kidneys, two ureters, a bladder, and a urethra. Your kidneys are a pair of bean-shaped organs. Each kidney is about the size of your fist. They are located below your ribs, one on each side of your spine. CAUSES Infections are caused by microbes, which are microscopic organisms, including fungi, viruses, and bacteria. These organisms are so small that they can only be seen through a microscope. Bacteria are the microbes that most commonly cause UTIs. SYMPTOMS  Symptoms of UTIs may vary by age and gender of the patient and by the location of the infection. Symptoms in Katherine [name] women typically include a frequent and intense urge to urinate and a painful, burning feeling in the bladder or urethra during urination. Older women and men are more likely to be tired, shaky, and weak and have muscle aches and abdominal pain. A fever may mean the infection is in your kidneys. Other symptoms of a kidney infection include pain in your back or sides below the ribs, nausea, and vomiting. DIAGNOSIS To diagnose a UTI, your caregiver will ask you about your symptoms. Your caregiver also will ask to provide a urine sample. The urine sample will be tested for bacteria and white blood cells. White blood cells are made by your body to help fight infection. TREATMENT  Typically, UTIs can be treated with medication. Because most UTIs are caused by a bacterial infection, they usually can be treated with the use of antibiotics. The choice of antibiotic and length of treatment depend on your symptoms and the type of bacteria causing your infection. HOME CARE INSTRUCTIONS  If you were prescribed antibiotics, take them exactly as your caregiver instructs you. Finish the medication even if you feel better after you  have only taken some of the medication.  Drink enough water and fluids to keep your urine clear or pale yellow.  Avoid caffeine, tea, and carbonated beverages. They tend to irritate your bladder.  Empty your bladder often. Avoid holding urine for long periods of time.  Empty your bladder before and after sexual intercourse.  After a bowel movement, women should cleanse from front to back. Use each tissue only once. SEEK MEDICAL CARE IF:   You have back pain.  You develop a fever.  Your symptoms do not begin to resolve within 3 days. SEEK IMMEDIATE MEDICAL CARE IF:   You have severe back pain or lower abdominal pain.  You develop chills.  You have nausea or vomiting.  You have continued burning or discomfort with urination. MAKE SURE YOU:   Understand these instructions.  Will watch your condition.  Will get help right away if you are not doing well or get worse. Document Released: 02/04/2005 Document Revised: 10/27/2011 Document Reviewed: 06/05/2011 [facility] Patient Information 2015 [place], [place]. This information is not intended to replace advice given to you by your health care provider. Make sure you discuss any questions you have with your health care provider.  Push fluids. If worsens please f/u . Feel better!

## 2014-08-11 NOTE — ED Provider Notes (Signed)
CSN: [id number]     Arrival date & time 08/11/14  1646 History   First MD Initiated Contact with Patient 08/11/14 1716     Chief Complaint  Patient presents with  . Hematuria   (Consider location/radiation/quality/duration/timing/severity/associated sxs/prior Treatment) HPI Comments: Patient presents with gross hematuria that began yesterday. This am she began to have "pressure in her bladder". Worse post voiding. She does have back pain, but is in therapy for M/S back pain. No flank pain. No fevers, n, v. No upper abdominal pain. History of nephrolithiasis; but she reports her pain was different.   Patient is a 64 y.o. female presenting with hematuria. The history is provided by the patient.  Hematuria    Past Medical History  Diagnosis Date  . Hyperlipidemia   . Chronic kidney disease     kidney stones  . Hypertension   . Diverticulosis of colon   . Elevated LFTs     neg for hep b and c in 2006  . Kidney infection   . History of mumps   . Yeast infection   . Bacterial infection   . Trichomonas   . Pelvic pain in female 10/27/10  . Arthritis     osteo  . Neuropathy     hands and feet   Past Surgical History  Procedure Laterality Date  . Carpal tunnel release    . Abdominal hysterectomy    . Replacement total knee bilateral    . Rotator cuff repair    . Kidney stone surgery    . Trigger finger release  2010    right  . Knee surgery    . Vesicovaginal fistula closure w/ tah    . Dilation and curettage of uterus    . Hand surgery  06/2011    2 surgeries on left hand  . Wrist surgery  10/2011    right wrist   Family History  Problem Relation Age of Onset  . Stroke Mother   . Cancer Mother 31    bone cancer  . Colon cancer Neg Hx   . Esophageal cancer Neg Hx   . Rectal cancer Neg Hx   . Stomach cancer Neg Hx   . Diabetes Sister    History  Substance Use Topics  . Smoking status: Never Smoker   . Smokeless tobacco: Never Used  . Alcohol Use: No   OB History     Gravida Para Term Preterm AB TAB SAB Ectopic Multiple Living   1 1             Review of Systems  Genitourinary: Positive for hematuria.  All other systems reviewed and are negative.   Allergies  Erythromycin  Home Medications   Prior to Admission medications   Medication Sig Start Date End Date Taking? Authorizing Provider  Cholecalciferol (VITAMIN D-3 PO) Take 2,000 Units by mouth.    Yes Historical Provider, MD  cyanocobalamin 1000 MCG tablet Take 1,000 mcg by mouth daily.    Yes Historical Provider, MD  L-Methylfolate-Algae-B12-B6 Glade Stanford) 3-90.314-2-35 MG CAPS Take one capsule by mouth two times daily 03/22/14  Yes Marcial Pacas, MD  Lidocaine-Prilocaine, Bulk, 2.5-2.5 % CREA Apply 1-2 application topically daily as needed (to the affected area). 07/31/14  Yes Marcial Pacas, MD  lisinopril (PRINIVIL,ZESTRIL) 10 MG tablet Take 1 tablet (10 mg total) by mouth daily. 05/29/14  Yes Marletta Lor, MD  Multiple Vitamin (MULTIVITAMIN) tablet Take 1 tablet by mouth daily.     Yes Historical Provider,  MD  nortriptyline (PAMELOR) 25 MG capsule Take 3 capsules (75 mg total) by mouth at bedtime. 07/31/14  Yes Marcial Pacas, MD  pregabalin (LYRICA) 100 MG capsule Take 1 capsule (100 mg total) by mouth 2 (two) times daily. 07/06/14  Yes Otilio Jefferson, NP  traMADol Veatrice Bourbon) 50 MG tablet Take one to two tablets by mouth every 6 hours if needed for pain Patient taking differently: Take one to two tablets by mouth every 6 hours if needed for pain, Prescribed by Neurology 01/03/14  Yes Otilio Jefferson, NP  estradiol (MENOSTAR) 14 MCG/24HR Place 1 patch onto the skin once a week.     Historical Provider, MD  ibuprofen (ADVIL,MOTRIN) 200 MG tablet Take 200 mg by mouth every 6 (six) hours as needed.      Historical Provider, MD  nitrofurantoin, macrocrystal-monohydrate, (MACROBID) 100 MG capsule Take 1 capsule (100 mg total) by mouth 2 (two) times daily. 08/11/14   Bjorn Pippin, PA-C   BP 126/81 mmHg  Pulse  112  Temp(Src) 98.2 F (36.8 C) (Oral)  Resp 18  SpO2 96% Physical Exam  Constitutional: She appears well-developed and well-nourished. No distress.  HENT:  Head: Normocephalic and atraumatic.  Abdominal: Soft. She exhibits no distension. There is no tenderness. There is no rebound and no guarding.  No pain over the kidneys  Musculoskeletal: Normal range of motion.  Skin: Skin is warm and dry. She is not diaphoretic. No erythema.  Psychiatric: Her behavior is normal.  Nursing note and vitals reviewed.   ED Course  Procedures (including critical care time) Labs Review Labs Reviewed - No data to display  Imaging Review No results found.   MDM   1. UTI (lower urinary tract infection)    Indicative of UTI by U/A. Treat with Macrodantin and fluids. F/U if worsens.     Bjorn Pippin, PA-C 08/11/14 1801

## 2014-08-11 NOTE — ED Notes (Signed)
C/o hematuria onset yesterday AM and it cleared.  I came back this AM.  C/o low abdominal pressure onset Thur.  No chills or fever. C/o fatigue and R flank pain.

## 2014-08-12 LAB — POCT URINALYSIS DIP (DEVICE)
BILIRUBIN URINE: NEGATIVE
Glucose, UA: NEGATIVE mg/dL
KETONES UR: NEGATIVE mg/dL
NITRITE: NEGATIVE
Protein, ur: NEGATIVE mg/dL
Specific Gravity, Urine: 1.02 (ref 1.005–1.030)
UROBILINOGEN UA: 0.2 mg/dL (ref 0.0–1.0)
pH: 6 (ref 5.0–8.0)

## 2014-09-15 ENCOUNTER — Encounter (HOSPITAL_COMMUNITY): Payer: Self-pay

## 2014-09-15 ENCOUNTER — Emergency Department (INDEPENDENT_AMBULATORY_CARE_PROVIDER_SITE_OTHER)
Admission: EM | Admit: 2014-09-15 | Discharge: 2014-09-15 | Disposition: A | Discharge status: Home / Self Care | Payer: Medicare Other | Source: Home / Self Care | Attending: Family Medicine | Admitting: Family Medicine

## 2014-09-15 DIAGNOSIS — R030 Elevated blood-pressure reading, without diagnosis of hypertension: Secondary | ICD-10-CM

## 2014-09-15 DIAGNOSIS — R1030 Lower abdominal pain, unspecified: Secondary | ICD-10-CM | POA: Diagnosis not present

## 2014-09-15 DIAGNOSIS — N390 Urinary tract infection, site not specified: Secondary | ICD-10-CM | POA: Diagnosis not present

## 2014-09-15 DIAGNOSIS — R319 Hematuria, unspecified: Secondary | ICD-10-CM

## 2014-09-15 DIAGNOSIS — IMO0001 Reserved for inherently not codable concepts without codable children: Secondary | ICD-10-CM

## 2014-09-15 DIAGNOSIS — R82998 Other abnormal findings in urine: Secondary | ICD-10-CM

## 2014-09-15 LAB — POCT URINALYSIS DIP (DEVICE)
Bilirubin Urine: NEGATIVE
GLUCOSE, UA: NEGATIVE mg/dL
Ketones, ur: NEGATIVE mg/dL
Nitrite: NEGATIVE
PH: 6.5 (ref 5.0–8.0)
Protein, ur: 30 mg/dL — AB
Specific Gravity, Urine: 1.02 (ref 1.005–1.030)
Urobilinogen, UA: 0.2 mg/dL (ref 0.0–1.0)

## 2014-09-15 MED ORDER — NITROFURANTOIN MONOHYD MACRO 100 MG PO CAPS: 100.0000 mg | ORAL_CAPSULE | Freq: Two times a day (BID) | ORAL | Status: DC

## 2014-09-15 NOTE — ED Provider Notes (Signed)
CSN: [id number]     Arrival date & time 09/15/14  1554 History   First MD Initiated Contact with Patient 09/15/14 1701     Chief Complaint  Patient presents with  . Hematuria    Patient is a 64 y.o. female presenting with hematuria and dysuria.  Hematuria Pertinent negatives include no chest pain, no abdominal pain and no shortness of breath.  Dysuria Pain quality:  Sharp Pain severity:  Moderate Onset quality:  Sudden Duration:  1 day Timing:  Intermittent Progression:  Waxing and waning Chronicity:  New Recent urinary tract infections: yes   Relieved by:  Nothing Urinary symptoms: discolored urine, foul-smelling urine, frequent urination, hematuria and hesitancy   Associated symptoms: no abdominal pain, no fever, no flank pain, no nausea, no vaginal discharge and no vomiting   Risk factors: hx of pyelonephritis and hx of urolithiasis   Risk factors: not sexually active and no urinary catheter     Past Medical History  Diagnosis Date  . Hyperlipidemia   . Chronic kidney disease     kidney stones  . Hypertension   . Diverticulosis of colon   . Elevated LFTs     neg for hep b and c in 2006  . Kidney infection   . History of mumps   . Yeast infection   . Bacterial infection   . Trichomonas   . Pelvic pain in female 10/27/10  . Arthritis     osteo  . Neuropathy     hands and feet   Past Surgical History  Procedure Laterality Date  . Carpal tunnel release    . Abdominal hysterectomy    . Replacement total knee bilateral    . Rotator cuff repair    . Kidney stone surgery    . Trigger finger release  2010    right  . Knee surgery    . Vesicovaginal fistula closure w/ tah    . Dilation and curettage of uterus    . Hand surgery  06/2011    2 surgeries on left hand  . Wrist surgery  10/2011    right wrist   Family History  Problem Relation Age of Onset  . Stroke Mother   . Cancer Mother 39    bone cancer  . Colon cancer Neg Hx   . Esophageal cancer Neg Hx   .  Rectal cancer Neg Hx   . Stomach cancer Neg Hx   . Diabetes Sister    History  Substance Use Topics  . Smoking status: Never Smoker   . Smokeless tobacco: Never Used  . Alcohol Use: No   OB History    Gravida Para Term Preterm AB TAB SAB Ectopic Multiple Living   1 1             Review of Systems  Constitutional: Negative for fever.  Respiratory: Negative for shortness of breath.   Cardiovascular: Negative for chest pain.  Gastrointestinal: Negative for nausea, vomiting and abdominal pain.  Genitourinary: Positive for dysuria and hematuria. Negative for flank pain and vaginal discharge.  Musculoskeletal: Negative for back pain and arthralgias.  Neurological: Negative for dizziness.    Allergies  Erythromycin  Home Medications   Prior to Admission medications   Medication Sig Start Date End Date Taking? Authorizing Provider  Cholecalciferol (VITAMIN D-3 PO) Take 2,000 Units by mouth.    Yes Historical Provider, MD  cyanocobalamin 1000 MCG tablet Take 1,000 mcg by mouth daily.    Yes Historical Provider,  MD  estradiol (MENOSTAR) 14 MCG/24HR Place 1 patch onto the skin once a week.    Yes Historical Provider, MD  L-Methylfolate-Algae-B12-B6 Glade Stanford) 3-90.314-2-35 MG CAPS Take one capsule by mouth two times daily 03/22/14  Yes Marcial Pacas, MD  lisinopril (PRINIVIL,ZESTRIL) 10 MG tablet Take 1 tablet (10 mg total) by mouth daily. 05/29/14  Yes Marletta Lor, MD  Multiple Vitamin (MULTIVITAMIN) tablet Take 1 tablet by mouth daily.     Yes Historical Provider, MD  nortriptyline (PAMELOR) 25 MG capsule Take 3 capsules (75 mg total) by mouth at bedtime. 07/31/14  Yes Marcial Pacas, MD  pregabalin (LYRICA) 100 MG capsule Take 1 capsule (100 mg total) by mouth 2 (two) times daily. 07/06/14  Yes Otilio Jefferson, NP  ibuprofen (ADVIL,MOTRIN) 200 MG tablet Take 200 mg by mouth every 6 (six) hours as needed.      Historical Provider, MD  Lidocaine-Prilocaine, Bulk, 2.5-2.5 % CREA Apply 1-2  application topically daily as needed (to the affected area). 07/31/14   Marcial Pacas, MD  nitrofurantoin, macrocrystal-monohydrate, (MACROBID) 100 MG capsule Take 1 capsule (100 mg total) by mouth 2 (two) times daily. 08/11/14   Bjorn Pippin, PA-C  traMADol (ULTRAM) 50 MG tablet Take one to two tablets by mouth every 6 hours if needed for pain Patient taking differently: Take one to two tablets by mouth every 6 hours if needed for pain, Prescribed by Neurology 01/03/14   Otilio Jefferson, NP   BP 144/96 mmHg  Pulse 118  Temp(Src) 98.5 F (36.9 C) (Oral)  Resp 20  SpO2 97% Physical Exam  Constitutional: She is oriented to person, place, and time. She appears well-developed.  Cardiovascular: Normal rate and normal heart sounds.   90 bpm check by me on exam   Pulmonary/Chest: Effort normal and breath sounds normal.  Abdominal: Soft. There is tenderness in the suprapubic area. There is no CVA tenderness.  Musculoskeletal: Normal range of motion.  Neurological: She is alert and oriented to person, place, and time. No cranial nerve deficit. Coordination normal.  Skin: Skin is warm and dry. She is not diaphoretic.  Psychiatric: She has a normal mood and affect.    ED Course  Procedures (including critical care time) Labs Review Labs Reviewed  POCT URINALYSIS DIP (DEVICE) - Abnormal; Notable for the following:    Hgb urine dipstick LARGE (*)    Protein, ur 30 (*)    Leukocytes, UA TRACE (*)    All other components within normal limits  URINE CULTURE    Imaging Review No results found.   MDM   1. Hematuria   2. Urine leukocytes increased   3. Suprapubic pressure, unspecified laterality    Patient here consistent with symptoms of a UTI. She is without CVA tenderness and radiation of the pain, however, she has a history of nephrolithiasis.  She had a similar episode back in early April of 16 and no culture was colleted at that time.  Will treat empirically and await culture from  today's clean catch.  Advised patient that she contact her PCP with regard to elevated BP.     Tereasa Coop, PA-C 09/15/14 1724  Tereasa Coop, PA-C 09/15/14 1728

## 2014-09-15 NOTE — ED Notes (Signed)
Pt returns today with c/o re- occurrent hematuria States she noticed sx's this morning followed by bladder pressure and frequency Denies chills or fever C/o slight cramping

## 2014-09-15 NOTE — ED Notes (Signed)
Candie Chroman RN, had computer sign -on issues, and used this writer's code to chart until IT issues correcvted

## 2014-09-15 NOTE — Discharge Instructions (Signed)

## 2014-09-16 LAB — URINE CULTURE: Colony Count: 4000

## 2014-09-16 NOTE — ED Notes (Signed)
Final report of UA culture shows insignificant growth . No further action required

## 2014-09-20 ENCOUNTER — Telehealth: Payer: Self-pay | Admitting: Internal Medicine

## 2014-09-20 NOTE — Telephone Encounter (Signed)
Pt went to urgent care for uti and her bp was high. She need a 30 min fup and Dr Raliegh Ip does not have any do you want me to schedule with someone else

## 2014-09-20 NOTE — Telephone Encounter (Signed)
No, put two appointments together or just do 15 minute.

## 2014-09-20 NOTE — Telephone Encounter (Signed)
I did a 15 min and used a  SDA  For Mon at [employer] 

## 2014-09-20 NOTE — Telephone Encounter (Signed)
Noted  

## 2014-09-24 ENCOUNTER — Encounter: Payer: Self-pay | Admitting: Internal Medicine

## 2014-09-24 ENCOUNTER — Ambulatory Visit (INDEPENDENT_AMBULATORY_CARE_PROVIDER_SITE_OTHER): Payer: Medicare Other | Admitting: Internal Medicine

## 2014-09-24 VITALS — BP 110/70 | HR 106 | Temp 98.1°F | Resp 20 | Ht 65.0 in | Wt 237.0 lb

## 2014-09-24 DIAGNOSIS — E785 Hyperlipidemia, unspecified: Secondary | ICD-10-CM | POA: Diagnosis not present

## 2014-09-24 DIAGNOSIS — I10 Essential (primary) hypertension: Secondary | ICD-10-CM

## 2014-09-24 NOTE — Progress Notes (Signed)
Subjective:    Patient ID: Katherine [name], female    DOB: [birth year], 64 y.o.   MRN: [id number]  HPI  BP Readings from Last 3 Encounters:  09/24/14 110/70  09/15/14 144/96  08/11/14 6/80   64 year old patient who is seen today in follow-up.  She has a history of essential hypertension, dyslipidemia as well as primary hyperparathyroidism.  Last visit hydrochlorothiazide was discontinued.  She continues to do well. She has had 2 urgent care visits for symptoms of cystitis.  Most recently, urine culture revealed insignificant growth.  She has responded to antibiotic therapy and today feels well Blood pressure was elevated a bit in the urgent care setting  Past Medical History  Diagnosis Date  . Hyperlipidemia   . Chronic kidney disease     kidney stones  . Hypertension   . Diverticulosis of colon   . Elevated LFTs     neg for hep b and c in 2006  . Kidney infection   . History of mumps   . Yeast infection   . Bacterial infection   . Trichomonas   . Pelvic pain in female 10/27/10  . Arthritis     osteo  . Neuropathy     hands and feet    History   Social History  . Marital Status: Single    Spouse Name: N/A  . Number of Children: 1  . Years of Education: N/A   Occupational History  .     Social History Main Topics  . Smoking status: Never Smoker   . Smokeless tobacco: Never Used  . Alcohol Use: No  . Drug Use: No  . Sexual Activity: No   Other Topics Concern  . Not on file   Social History Narrative   Patient is single, has 1 child   Patient is right handed   Caffeine consumption is 0    Past Surgical History  Procedure Laterality Date  . Carpal tunnel release    . Abdominal hysterectomy    . Replacement total knee bilateral    . Rotator cuff repair    . Kidney stone surgery    . Trigger finger release  2010    right  . Knee surgery    . Vesicovaginal fistula closure w/ tah    . Dilation and curettage of uterus    . Hand surgery  06/2011   2 surgeries on left hand  . Wrist surgery  10/2011    right wrist    Family History  Problem Relation Age of Onset  . Stroke Mother   . Cancer Mother 51    bone cancer  . Colon cancer Neg Hx   . Esophageal cancer Neg Hx   . Rectal cancer Neg Hx   . Stomach cancer Neg Hx   . Diabetes Sister     Allergies  Allergen Reactions  . Erythromycin     REACTION: chest pain    Current Outpatient Prescriptions on File Prior to Visit  Medication Sig Dispense Refill  . Cholecalciferol (VITAMIN D-3 PO) Take 2,000 Units by mouth.     . cyanocobalamin 1000 MCG tablet Take 1,000 mcg by mouth daily.     Marland Kitchen ibuprofen (ADVIL,MOTRIN) 200 MG tablet Take 200 mg by mouth every 6 (six) hours as needed.      Marland Kitchen L-Methylfolate-Algae-B12-B6 (METANX) 3-90.314-2-35 MG CAPS Take one capsule by mouth two times daily 180 capsule 3  . Lidocaine-Prilocaine, Bulk, 2.5-2.5 % CREA Apply 1-2 application topically daily as  needed (to the affected area). 6 Bottle 1  . lisinopril (PRINIVIL,ZESTRIL) 10 MG tablet Take 1 tablet (10 mg total) by mouth daily. 90 tablet 3  . Multiple Vitamin (MULTIVITAMIN) tablet Take 1 tablet by mouth daily.      . nitrofurantoin, macrocrystal-monohydrate, (MACROBID) 100 MG capsule Take 1 capsule (100 mg total) by mouth 2 (two) times daily. 20 capsule 0  . nortriptyline (PAMELOR) 25 MG capsule Take 3 capsules (75 mg total) by mouth at bedtime. 270 capsule 2  . pregabalin (LYRICA) 100 MG capsule Take 1 capsule (100 mg total) by mouth 2 (two) times daily. 180 capsule 1  . traMADol (ULTRAM) 50 MG tablet Take one to two tablets by mouth every 6 hours if needed for pain (Patient taking differently: Take one to two tablets by mouth every 6 hours if needed for pain, Prescribed by Neurology) 180 tablet 1  . [DISCONTINUED] gabapentin (NEURONTIN) 300 MG capsule Take 300 mg by mouth 3 (three) times daily.       No current facility-administered medications on file prior to visit.    BP 110/70 mmHg   Pulse 106  Temp(Src) 98.1 F (36.7 C) (Oral)  Resp 20  Ht 5\' 5"  (1.651 m)  Wt 237 lb (107.502 kg)  BMI 39.44 kg/m2  SpO2 98%    Review of Systems  Constitutional: Negative.   HENT: Negative for congestion, dental problem, hearing loss, rhinorrhea, sinus pressure, sore throat and tinnitus.   Eyes: Negative for pain, discharge and visual disturbance.  Respiratory: Negative for cough and shortness of breath.   Cardiovascular: Negative for chest pain, palpitations and leg swelling.  Gastrointestinal: Positive for abdominal pain. Negative for nausea, vomiting, diarrhea, constipation, blood in stool and abdominal distention.  Genitourinary: Positive for urgency and frequency. Negative for dysuria, hematuria, flank pain, vaginal bleeding, vaginal discharge, difficulty urinating, vaginal pain and pelvic pain.  Musculoskeletal: Negative for joint swelling, arthralgias and gait problem.  Skin: Negative for rash.  Neurological: Negative for dizziness, syncope, speech difficulty, weakness, numbness and headaches.  Hematological: Negative for adenopathy.  Psychiatric/Behavioral: Negative for behavioral problems, dysphoric mood and agitation. The patient is not nervous/anxious.        Objective:   Physical Exam  Constitutional: She is oriented to person, place, and time. She appears well-developed and well-nourished.  HENT:  Head: Normocephalic.  Right Ear: External ear normal.  Left Ear: External ear normal.  Mouth/Throat: Oropharynx is clear and moist.  Eyes: Conjunctivae and EOM are normal. Pupils are equal, round, and reactive to light.  Neck: Normal range of motion. Neck supple. No thyromegaly present.  Cardiovascular: Normal rate, regular rhythm, normal heart sounds and intact distal pulses.   Pulmonary/Chest: Effort normal and breath sounds normal.  Abdominal: Soft. Bowel sounds are normal. She exhibits no mass. There is no tenderness.  Musculoskeletal: Normal range of motion.    Lymphadenopathy:    She has no cervical adenopathy.  Neurological: She is alert and oriented to person, place, and time.  Skin: Skin is warm and dry. No rash noted.  Psychiatric: She has a normal mood and affect. Her behavior is normal.          Assessment & Plan:   Hypertension Status post treatment for UTI Dyslipidemia  No change in therapy Recheck 6 months

## 2014-09-24 NOTE — Progress Notes (Signed)
Pre visit review using our clinic review tool, if applicable. No additional management support is needed unless otherwise documented below in the visit note. 

## 2014-09-24 NOTE — Patient Instructions (Signed)
Limit your sodium (Salt) intake  Please check your blood pressure on a regular basis.  If it is consistently greater than 150/90, please make an office appointment.     It is important that you exercise regularly, at least 20 minutes 3 to 4 times per week.  If you develop chest pain or shortness of breath seek  medical attention. 

## 2014-10-05 ENCOUNTER — Encounter: Payer: Self-pay | Admitting: Gastroenterology

## 2014-10-17 LAB — HM MAMMOGRAPHY

## 2014-10-22 ENCOUNTER — Encounter: Payer: Self-pay | Admitting: Internal Medicine

## 2014-11-15 ENCOUNTER — Telehealth: Payer: Self-pay | Admitting: Internal Medicine

## 2014-11-15 DIAGNOSIS — R319 Hematuria, unspecified: Secondary | ICD-10-CM

## 2014-11-15 NOTE — Telephone Encounter (Signed)
Okay to send referral to Urology?

## 2014-11-15 NOTE — Telephone Encounter (Signed)
ok 

## 2014-11-15 NOTE — Telephone Encounter (Signed)
Pt would like referral to urology. Pt states she went to UC 3 months in a row for this issue.  Pt has blood in the urine and pressure.

## 2014-11-16 NOTE — Telephone Encounter (Signed)
Spoke to pt, told her order for referral to Urologist was sent and someone will contact you for an appointment. Pt verbalized understanding.

## 2014-12-06 ENCOUNTER — Other Ambulatory Visit: Payer: Self-pay | Admitting: Urology

## 2014-12-07 NOTE — Patient Instructions (Signed)
Katherine [name]  12/07/2014   Your procedure is scheduled on: December 18, 2014  Report to [facility] Main  Entrance take [facility]  elevators to 3rd floor to  [place] 11:00 at AM.  Call this number if you have problems the morning of surgery [phone]   Remember: ONLY 1 PERSON MAY GO WITH YOU TO SHORT STAY TO GET  READY MORNING OF [place].  Do not eat food or drink liquids :After Midnight.     Take these medicines the morning of surgery with A SIP OF WATER: Lyrica (pregabalin)                               You may not have any metal on your body including hair pins and              piercings  Do not wear jewelry, make-up, lotions, powders or perfumes, deodorant             Do not wear nail polish.  Do not shave  48 hours prior to surgery.            .   .           Do not bring valuables to the hospital.  [facility] is not responsible for valuables.  Contacts, dentures or bridgework may not be worn into surgery.  Leave suitcase in the car. After surgery it may be brought to your room.     Patients discharged the day of surgery will not be allowed to drive home.  Name and phone number of your driver:  Special Instructions: coughing and deep breathing exercises, leg exercises              Please read over the following fact sheets you were given: _____________________________________________________________________             [facility] - Preparing for Surgery Before surgery, you can play an important role.  Because skin is not sterile, your skin needs to be as free of germs as possible.  You can reduce the number of germs on your skin by washing with CHG (chlorahexidine gluconate) soap before surgery.  CHG is an antiseptic cleaner which kills germs and bonds with the skin to continue killing germs even after washing. Please DO NOT use if you have an allergy to CHG or antibacterial soaps.  If your skin becomes reddened/irritated stop using the  CHG and inform your nurse when you arrive at Short Stay. Do not shave (including legs and underarms) for at least 48 hours prior to the first CHG shower.  You may shave your face/neck. Please follow these instructions carefully:  1.  Shower with CHG Soap the night before surgery and the  morning of Surgery.  2.  If you choose to wash your hair, wash your hair first as usual with your  normal  shampoo.  3.  After you shampoo, rinse your hair and body thoroughly to remove the  shampoo.                           4.  Use CHG as you would any other liquid soap.  You can apply chg directly  to the skin and wash  Gently with a scrungie or clean washcloth.  5.  Apply the CHG Soap to your body ONLY FROM THE NECK DOWN.   Do not use on face/ open                           Wound or open sores. Avoid contact with eyes, ears mouth and genitals (private parts).                       Wash face,  Genitals (private parts) with your normal soap.             6.  Wash thoroughly, paying special attention to the area where your surgery  will be performed.  7.  Thoroughly rinse your body with warm water from the neck down.  8.  DO NOT shower/wash with your normal soap after using and rinsing off  the CHG Soap.                9.  Pat yourself dry with a clean towel.            10.  Wear clean pajamas.            11.  Place clean sheets on your bed the night of your first shower and do not  sleep with pets. Day of Surgery : Do not apply any lotions/deodorants the morning of surgery.  Please wear clean clothes to the hospital/surgery center.  FAILURE TO FOLLOW THESE INSTRUCTIONS MAY RESULT IN THE CANCELLATION OF YOUR SURGERY PATIENT SIGNATURE_________________________________  NURSE SIGNATURE__________________________________  ________________________________________________________________________

## 2014-12-10 ENCOUNTER — Encounter (HOSPITAL_COMMUNITY)
Admission: RE | Admit: 2014-12-10 | Discharge: 2014-12-10 | Disposition: A | Discharge status: Home / Self Care | Payer: Medicare Other | Source: Ambulatory Visit | Attending: Urology | Admitting: Urology

## 2014-12-10 ENCOUNTER — Encounter (HOSPITAL_COMMUNITY): Payer: Self-pay

## 2014-12-10 DIAGNOSIS — N201 Calculus of ureter: Secondary | ICD-10-CM | POA: Insufficient documentation

## 2014-12-10 DIAGNOSIS — Z01810 Encounter for preprocedural cardiovascular examination: Secondary | ICD-10-CM | POA: Insufficient documentation

## 2014-12-10 DIAGNOSIS — Z01812 Encounter for preprocedural laboratory examination: Secondary | ICD-10-CM | POA: Diagnosis not present

## 2014-12-10 LAB — BASIC METABOLIC PANEL
Anion gap: 5 (ref 5–15)
BUN: 13 mg/dL (ref 6–20)
CO2: 28 mmol/L (ref 22–32)
Calcium: 11 mg/dL — ABNORMAL HIGH (ref 8.9–10.3)
Chloride: 105 mmol/L (ref 101–111)
Creatinine, Ser: 0.9 mg/dL (ref 0.44–1.00)
GFR calc Af Amer: >60 mL/min (ref >60)
GFR calc non Af Amer: >60 mL/min (ref >60)
Glucose, Bld: 108 mg/dL — ABNORMAL HIGH (ref 65–99)
Potassium: 4.3 mmol/L (ref 3.5–5.1)
Sodium: 138 mmol/L (ref 135–145)

## 2014-12-10 LAB — CBC
HCT: 36.8 % (ref 36.0–46.0)
Hemoglobin: 12.3 g/dL (ref 12.0–15.0)
MCH: 30.1 pg (ref 26.0–34.0)
MCHC: 33.4 g/dL (ref 30.0–36.0)
MCV: 90.2 fL (ref 78.0–100.0)
Platelets: 159 10*3/uL (ref 150–400)
RBC: 4.08 MIL/uL (ref 3.87–5.11)
RDW: 14.1 % (ref 11.5–15.5)
WBC: 3.2 10*3/uL — AB (ref 4.0–10.5)

## 2014-12-10 NOTE — Progress Notes (Signed)
09-24-14 - LOV- Dr. Kennieth Rad (fam. Med) - EPIC 09-15-14 - ER -Urine culture - EPIC 07-06-14 - LOV - Evlyn Courier, NP (neuro) Pt. With neuroperipheral neuropathy) - EPIC 07-07-13 - EKG - EPIC

## 2014-12-10 NOTE — Progress Notes (Signed)
   12/10/14 1037  OBSTRUCTIVE SLEEP APNEA  Have you ever been diagnosed with sleep apnea through a sleep study? No  Do you snore loudly (loud enough to be heard through closed doors)?  0  Do you often feel tired, fatigued, or sleepy during the daytime? 1  Has anyone observed you stop breathing during your sleep? 0  Do you have, or are you being treated for high blood pressure? 1  BMI more than 35 kg/m2? 1  Age over 66 years old? 1  Neck circumference greater than 40 cm/16 inches? 0  Gender: 0  Obstructive Sleep Apnea Score 4

## 2014-12-18 ENCOUNTER — Encounter (HOSPITAL_COMMUNITY): Payer: Self-pay | Admitting: *Deleted

## 2014-12-18 ENCOUNTER — Ambulatory Visit (HOSPITAL_COMMUNITY)
Admission: RE | Admit: 2014-12-18 | Discharge: 2014-12-18 | Disposition: A | Discharge status: Home / Self Care | Payer: Medicare Other | Source: Ambulatory Visit | Attending: Urology | Admitting: Urology

## 2014-12-18 ENCOUNTER — Encounter (HOSPITAL_COMMUNITY)
Admission: RE | Disposition: A | Discharge status: Home / Self Care | Payer: Self-pay | Source: Ambulatory Visit | Attending: Urology

## 2014-12-18 ENCOUNTER — Ambulatory Visit (HOSPITAL_COMMUNITY): Payer: Medicare Other | Admitting: Anesthesiology

## 2014-12-18 DIAGNOSIS — E785 Hyperlipidemia, unspecified: Secondary | ICD-10-CM | POA: Diagnosis not present

## 2014-12-18 DIAGNOSIS — Z87442 Personal history of urinary calculi: Secondary | ICD-10-CM | POA: Diagnosis not present

## 2014-12-18 DIAGNOSIS — N132 Hydronephrosis with renal and ureteral calculous obstruction: Secondary | ICD-10-CM | POA: Diagnosis not present

## 2014-12-18 DIAGNOSIS — G709 Myoneural disorder, unspecified: Secondary | ICD-10-CM | POA: Diagnosis not present

## 2014-12-18 DIAGNOSIS — N189 Chronic kidney disease, unspecified: Secondary | ICD-10-CM | POA: Diagnosis not present

## 2014-12-18 DIAGNOSIS — N201 Calculus of ureter: Secondary | ICD-10-CM | POA: Diagnosis present

## 2014-12-18 DIAGNOSIS — Z792 Long term (current) use of antibiotics: Secondary | ICD-10-CM | POA: Diagnosis not present

## 2014-12-18 DIAGNOSIS — I129 Hypertensive chronic kidney disease with stage 1 through stage 4 chronic kidney disease, or unspecified chronic kidney disease: Secondary | ICD-10-CM | POA: Insufficient documentation

## 2014-12-18 DIAGNOSIS — Z79899 Other long term (current) drug therapy: Secondary | ICD-10-CM | POA: Diagnosis not present

## 2014-12-18 DIAGNOSIS — G629 Polyneuropathy, unspecified: Secondary | ICD-10-CM | POA: Insufficient documentation

## 2014-12-18 DIAGNOSIS — Z6839 Body mass index (BMI) 39.0-39.9, adult: Secondary | ICD-10-CM | POA: Diagnosis not present

## 2014-12-18 DIAGNOSIS — E213 Hyperparathyroidism, unspecified: Secondary | ICD-10-CM | POA: Diagnosis not present

## 2014-12-18 DIAGNOSIS — M1990 Unspecified osteoarthritis, unspecified site: Secondary | ICD-10-CM | POA: Diagnosis not present

## 2014-12-18 DIAGNOSIS — R310 Gross hematuria: Secondary | ICD-10-CM | POA: Insufficient documentation

## 2014-12-18 DIAGNOSIS — Z791 Long term (current) use of non-steroidal anti-inflammatories (NSAID): Secondary | ICD-10-CM | POA: Insufficient documentation

## 2014-12-18 HISTORY — PX: CYSTOSCOPY WITH RETROGRADE PYELOGRAM, URETEROSCOPY AND STENT PLACEMENT: SHX5789

## 2014-12-18 HISTORY — PX: HOLMIUM LASER APPLICATION: SHX5852

## 2014-12-18 SURGERY — CYSTOURETEROSCOPY, WITH RETROGRADE PYELOGRAM AND STENT INSERTION
Anesthesia: General | Laterality: Left

## 2014-12-18 MED ORDER — LIDOCAINE HCL 1 % IJ SOLN
INTRAMUSCULAR | Status: DC | PRN
  Administered 2014-12-18: 60 mg via INTRADERMAL

## 2014-12-18 MED ORDER — PROMETHAZINE HCL 25 MG/ML IJ SOLN: 6.2500 mg | INTRAMUSCULAR | Status: DC | PRN

## 2014-12-18 MED ORDER — FENTANYL CITRATE (PF) 100 MCG/2ML IJ SOLN: 25.0000 ug | INTRAMUSCULAR | Status: DC | PRN

## 2014-12-18 MED ORDER — PROPOFOL 10 MG/ML IV BOLUS
INTRAVENOUS | Status: AC
  Filled 2014-12-18: qty 20

## 2014-12-18 MED ORDER — SODIUM CHLORIDE 0.9 % IR SOLN
Status: DC | PRN
Start: 2014-12-18 — End: 2014-12-18
  Administered 2014-12-18: 4000 mL

## 2014-12-18 MED ORDER — FENTANYL CITRATE (PF) 100 MCG/2ML IJ SOLN
INTRAMUSCULAR | Status: DC | PRN
  Administered 2014-12-18 (×2): 50 ug via INTRAVENOUS

## 2014-12-18 MED ORDER — FENTANYL CITRATE (PF) 100 MCG/2ML IJ SOLN
INTRAMUSCULAR | Status: AC
  Filled 2014-12-18: qty 4

## 2014-12-18 MED ORDER — 0.9 % SODIUM CHLORIDE (POUR BTL) OPTIME
TOPICAL | Status: DC | PRN
  Administered 2014-12-18: 1000 mL

## 2014-12-18 MED ORDER — CIPROFLOXACIN IN D5W 400 MG/200ML IV SOLN
400.0000 mg | INTRAVENOUS | Status: AC
  Administered 2014-12-18: 400 mg via INTRAVENOUS

## 2014-12-18 MED ORDER — DEXAMETHASONE SODIUM PHOSPHATE 4 MG/ML IJ SOLN
INTRAMUSCULAR | Status: DC | PRN
  Administered 2014-12-18: 5 mg via INTRAVENOUS

## 2014-12-18 MED ORDER — ONDANSETRON HCL 4 MG/2ML IJ SOLN
INTRAMUSCULAR | Status: DC | PRN
  Administered 2014-12-18: 4 mg via INTRAVENOUS

## 2014-12-18 MED ORDER — LIDOCAINE HCL (CARDIAC) 20 MG/ML IV SOLN
INTRAVENOUS | Status: AC
  Filled 2014-12-18: qty 5

## 2014-12-18 MED ORDER — CIPROFLOXACIN IN D5W 400 MG/200ML IV SOLN
INTRAVENOUS | Status: AC
  Filled 2014-12-18: qty 200

## 2014-12-18 MED ORDER — ONDANSETRON HCL 4 MG/2ML IJ SOLN
INTRAMUSCULAR | Status: AC
  Filled 2014-12-18: qty 2

## 2014-12-18 MED ORDER — MIDAZOLAM HCL 5 MG/5ML IJ SOLN
INTRAMUSCULAR | Status: DC | PRN
Start: 2014-12-18 — End: 2014-12-18
  Administered 2014-12-18 (×2): 1 mg via INTRAVENOUS

## 2014-12-18 MED ORDER — ESMOLOL HCL 10 MG/ML IV SOLN
INTRAVENOUS | Status: DC | PRN
  Administered 2014-12-18 (×2): 15 mg via INTRAVENOUS

## 2014-12-18 MED ORDER — HYDROCODONE-ACETAMINOPHEN 5-325 MG PO TABS
1.0000 | ORAL_TABLET | Freq: Four times a day (QID) | ORAL | Status: DC | PRN
  Administered 2014-12-18: 2 via ORAL
  Filled 2014-12-18: qty 2

## 2014-12-18 MED ORDER — ESMOLOL HCL 10 MG/ML IV SOLN
INTRAVENOUS | Status: AC
  Filled 2014-12-18: qty 10

## 2014-12-18 MED ORDER — LACTATED RINGERS IV SOLN
INTRAVENOUS | Status: DC
  Administered 2014-12-18: 1000 mL via INTRAVENOUS

## 2014-12-18 MED ORDER — METOCLOPRAMIDE HCL 5 MG/ML IJ SOLN
INTRAMUSCULAR | Status: DC | PRN
  Administered 2014-12-18: 10 mg via INTRAVENOUS

## 2014-12-18 MED ORDER — HYDROCODONE-ACETAMINOPHEN 5-325 MG PO TABS
1.0000 | ORAL_TABLET | Freq: Four times a day (QID) | ORAL | Status: DC | PRN
Start: 2014-12-18 — End: 2015-01-09

## 2014-12-18 MED ORDER — METOCLOPRAMIDE HCL 5 MG/ML IJ SOLN
INTRAMUSCULAR | Status: AC
  Filled 2014-12-18: qty 2

## 2014-12-18 MED ORDER — MIDAZOLAM HCL 2 MG/2ML IJ SOLN
INTRAMUSCULAR | Status: AC
  Filled 2014-12-18: qty 4

## 2014-12-18 MED ORDER — LACTATED RINGERS IV SOLN
INTRAVENOUS | Status: DC | PRN
  Administered 2014-12-18: 13:00:00 via INTRAVENOUS

## 2014-12-18 SURGICAL SUPPLY — 18 items
BAG URO CATCHER STRL LF (DRAPE) ×3 IMPLANT
CATH INTERMIT  6FR 70CM (CATHETERS) ×3 IMPLANT
CLOTH BEACON ORANGE TIMEOUT ST (SAFETY) ×3 IMPLANT
EXTRACTOR STONE NITINOL NGAGE (UROLOGICAL SUPPLIES) ×2 IMPLANT
FIBER LASER FLEXIVA 1000 (UROLOGICAL SUPPLIES) IMPLANT
FIBER LASER FLEXIVA 200 (UROLOGICAL SUPPLIES) IMPLANT
FIBER LASER FLEXIVA 365 (UROLOGICAL SUPPLIES) ×2 IMPLANT
FIBER LASER FLEXIVA 550 (UROLOGICAL SUPPLIES) IMPLANT
FIBER LASER TRAC TIP (UROLOGICAL SUPPLIES) IMPLANT
GLOVE BIOGEL M STRL SZ7.5 (GLOVE) ×3 IMPLANT
GOWN STRL REUS W/TWL LRG LVL3 (GOWN DISPOSABLE) ×6 IMPLANT
GUIDEWIRE ANG ZIPWIRE 038X150 (WIRE) IMPLANT
GUIDEWIRE STR DUAL SENSOR (WIRE) ×3 IMPLANT
MANIFOLD NEPTUNE II (INSTRUMENTS) ×3 IMPLANT
PACK CYSTO (CUSTOM PROCEDURE TRAY) ×3 IMPLANT
SHEATH ACCESS URETERAL 38CM (SHEATH) IMPLANT
TUBING CONNECTING 10 (TUBING) ×2 IMPLANT
TUBING CONNECTING 10' (TUBING) ×1

## 2014-12-18 NOTE — Transfer of Care (Signed)
Immediate Anesthesia Transfer of Care Note  Patient: Katherine [name]  Procedure(s) Performed: Procedure(s): CYSTOSCOPY WITH RETROGRADE PYELOGRAM, URETEROSCOPY AND STENT PLACEMENT left ureter (Left) HOLMIUM LASER APPLICATION left ureter  (Left)  Patient Location: PACU  Anesthesia Type:General  Level of Consciousness: awake, alert , oriented and patient cooperative  Airway & Oxygen Therapy: Patient Spontanous Breathing and Patient connected to face mask oxygen  Post-op Assessment: Report given to RN and Post -op Vital signs reviewed and stable  Post vital signs: Reviewed and stable  Last Vitals:  Filed Vitals:   12/18/14 1059  BP: 143/90  Pulse: 107  Temp: 36.6 C  Resp: 16    Complications: No apparent anesthesia complications

## 2014-12-18 NOTE — Discharge Instructions (Addendum)
1. You may see some blood in the urine and may have some burning with urination for 48-72 hours. You also may notice that you have to urinate more frequently or urgently after your procedure which is normal.  2. You should call should you develop an inability urinate, fever > 101, persistent nausea and vomiting that prevents you from eating or drinking to stay hydrated.  3. If you have a stent, you will likely urinate more frequently and urgently until the stent is removed and you may experience some discomfort/pain in the lower abdomen and flank especially when urinating. You may take pain medication prescribed to you if needed for pain. You may also intermittently have blood in the urine until the stent is removed.      General Anesthesia, Care After Refer to this sheet in the next few weeks. These instructions provide you with information on caring for yourself after your procedure. Your health care provider may also give you more specific instructions. Your treatment has been planned according to current medical practices, but problems sometimes occur. Call your health care provider if you have any problems or questions after your procedure. WHAT TO EXPECT AFTER THE PROCEDURE After the procedure, it is typical to experience:  Sleepiness.  Nausea and vomiting. HOME CARE INSTRUCTIONS  For the first 24 hours after general anesthesia:  Have a responsible person with you.  Do not drive a car. If you are alone, do not take public transportation.  Do not drink alcohol.  Do not take medicine that has not been prescribed by your health care provider.  Do not sign important papers or make important decisions.  You may resume a normal diet and activities as directed by your health care provider.  Change bandages (dressings) as directed.  If you have questions or problems that seem related to general anesthesia, call the hospital and ask for the anesthetist or anesthesiologist on  call. SEEK MEDICAL CARE IF:  You have nausea and vomiting that continue the day after anesthesia.  You develop a rash. SEEK IMMEDIATE MEDICAL CARE IF:   You have difficulty breathing.  You have chest pain.  You have any allergic problems. Document Released: 08/03/2000 Document Revised: 05/02/2013 Document Reviewed: 11/10/2012 [facility] Patient Information 2015 [place], [place]. This information is not intended to replace advice given to you by your health care provider. Make sure you discuss any questions you have with your health care provider.

## 2014-12-18 NOTE — H&P (Signed)
Chief Complaint Gross hematuria   History of Present Illness Katherine [name] is a 64 year old female seen today at the request of Dr. Bluford Kaufmann for gross hematuria. She apparently first developed symptoms including gross blood along with suprapubic pressure, urgency, and frequency in April. She was felt to possibly have a urinary tract infection and apparently was treated with antibiotics. Further evaluation in May after recurrent symptoms also resulted in treatment with antibiotics. Review of her laboratory records demonstrate no positive urine cultures. She has continued to have the symptoms persist intermittently over the past couple of months. She denies any fever or significant flank pain. She does have a remote history of urolithiasis previously treated by Dr. Janice Norrie. She did require surgical intervention although cannot recall whether she underwent ureteroscopy or shockwave lithotripsy. She feels that her symptoms are reminiscent of her prior stone episodes. She denies a history of tobacco use. She denies a history of GU malignancy. She denies a family history of GU malignancy. She denies a history of prior urologic surgery aside from her kidney stone surgery and denies a history of trauma. 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 History of Dyslipidemia (E78.5) 2. History of chronic kidney disease (Z87.448) 3. History of diverticulitis of colon (Z87.19) 4. History of hyperparathyroidism (Z86.39) 5. History of hypertension (Z86.79) 6. History of nephrolithiasis (Z87.442) 7. History of neuropathy (Z86.69) 8. History of peripheral neuropathy (Z86.69)  Surgical History Problems  1. History of Dilation And Curettage 2. History of Hand Repair 3. History of Knee Replacement 4. History of Shoulder Arthroscopy With Rotator Cuff Repair 5.  History of Tonsillectomy 6. History of Total Abdominal Hysterectomy 7. History of Wrist Arthroscopy With Release Of Transverse Carpal Ligament  Current Meds 1. Advil 200 MG Oral Capsule;  Therapy: (Recorded:20Jul2016) to Recorded 2. Estroven TABS;  Therapy: (Recorded:20Jul2016) to Recorded 3. Lisinopril 10 MG Oral Tablet;  Therapy: (Recorded:20Jul2016) to Recorded 4. Lyrica 50 MG Oral Capsule;  Therapy: (Recorded:20Jul2016) to Recorded 5. Macrobid 100 MG Oral Capsule (Nitrofurantoin Monohyd Macro);  Therapy: (Recorded:20Jul2016) to Recorded 6. Metanx 3-35-2 MG TABS;  Therapy: (Recorded:20Jul2016) to Recorded 7. Multi-Vitamin TABS;  Therapy: (Recorded:20Jul2016) to Recorded 8. Nortriptyline HCl - 50 MG Oral Capsule;  Therapy: (Recorded:20Jul2016) to Recorded 9. TraMADol HCl - 50 MG Oral Tablet;  Therapy: (Recorded:20Jul2016) to Recorded 10. Vitamin B12 1000 MCG Oral Tablet Extended Release;   Therapy: (Recorded:20Jul2016) to Recorded 11. Vitamin D-3 5000 UNIT TABS;   Therapy: (Recorded:20Jul2016) to Recorded  Allergies Medication  1. Erythromycin Derivatives  Family History Problems  1. Family history of Bone cancer : Mother 2. Family history of Brain tumor : Mother 3. Family history of asthma (Z82.5) : Mother 4. Family history of chronic obstructive pulmonary disease (Z82.5) : Mother 5. Family history of malignant neoplasm of uterus (Z80.49) : Mother  Social History Problems    Denied: History of Alcohol use   Denied: History of Caffeine use   Never a smoker  Review of Systems Genitourinary, constitutional, skin, eye, otolaryngeal, hematologic/lymphatic, cardiovascular, pulmonary, endocrine, musculoskeletal, gastrointestinal, neurological and psychiatric system(s) were reviewed and pertinent findings if present are noted and are otherwise negative.  Constitutional: night sweats and feeling tired (fatigue).  ENT: sinus problems.  Cardiovascular: leg swelling.   Respiratory: shortness of breath.  Musculoskeletal: back pain and joint pain.    Vitals Vital Signs [Data Includes: Last 1  Day]  Recorded: 20Jul2016 10:18AM  Blood Pressure: 132 / 88 Heart Rate: 99 Recorded: 20Jul2016 10:08AM  Height: 5 ft 5 in Weight: 237 lb  BMI Calculated: 39.44 BSA Calculated: 2.13  Physical Exam Constitutional: Well nourished and well developed . No acute distress.  ENT:. The ears and nose are normal in appearance.  Neck: The appearance of the neck is normal and no neck mass is present.  Pulmonary: No respiratory distress, normal respiratory rhythm and effort and clear bilateral breath sounds.  Cardiovascular: Heart rate and rhythm are normal . No peripheral edema.  Abdomen: Pfannensteil incision site(s) well healed. The abdomen is soft and nontender. No masses are palpated. No CVA tenderness. No hernias are palpable. No hepatosplenomegaly noted.  Lymphatics: The femoral and inguinal nodes are not enlarged or tender.  Skin: Normal skin turgor, no visible rash and no visible skin lesions.  Neuro/Psych:. Mood and affect are appropriate.    Results/Data Urine [Data Includes: Last 1 Day]   [id number]  COLOR YELLOW   APPEARANCE CLEAR   SPECIFIC GRAVITY 1.010   pH 7.0   GLUCOSE NEG mg/dL  BILIRUBIN NEG   KETONE NEG mg/dL  BLOOD LARGE   PROTEIN NEG mg/dL  UROBILINOGEN 0.2 mg/dL  NITRITE NEG   LEUKOCYTE ESTERASE TRACE   SQUAMOUS EPITHELIAL/HPF FEW   WBC 0-2 WBC/hpf  RBC 11-20 RBC/hpf  BACTERIA NONE SEEN   CRYSTALS NONE SEEN   CASTS NONE SEEN    I have reviewed her medical records and prior laboratory studies. Findings are as dictated above. Her urinalysis had demonstrated dipstick positive blood. Her urine cultures demonstrated insignificant growth.   Assessment Assessed  1. Gross hematuria (R31.0)  Plan Gross hematuria  1. CREATININE with eGFR; Status:In Progress - Specimen/Data Collected;   Done:  [id number] 2. Cysto; Status:Hold For - Date of  Service; Requested for:10Aug2016;  3. VENIPUNCTURE; Status:Complete;   Done: [id number] 4. Follow-up Office  Follow-up  Status: Hold For - Date of Service  Requested for:  10Aug2016 Health Maintenance  5. UA With REFLEX; [Do Not Release]; Status:Complete;   Done: [id number] 10:01AM  Discussion/Summary 1. Gross hematuria/lower urinary tract symptoms: She has no evidence of infection. I have recommended that she proceed with a full urologic evaluation including upper tract imaging with CT followed by cystoscopy. This will be scheduled in the near future. She will also complete her physical exam at that time.    Cc: Dr. Bluford Kaufmann     Verified Results AU CT-HEMATURIA PROTOCOL1 [id number] 12:00AM1 Read Drivers  [Dec 04, 2014 6:44PM Alinda Money, Xia Stohr] Pt was informed of CT findings indicating two left ureteral stones. She was instructed to call if she develops fever or worsening pain. I have recommended she be scheduled for left ureteroscopic laser lithotripsy and possible stent placement. This will be scheduled. Her OV will be scheduled for cystoscopy. The risks,complications, and expected recovery process of the planned procedure were discussed and she gives informed consent to go forward with this procedure.   Test Name Result Flag Reference  AU CT-HEMATURIA PROTOCOL1 (Report)1    ** RADIOLOGY REPORT BY [facility] **   CLINICAL DATA: Microhematuria and gross hematuria  EXAM: CT ABDOMEN AND PELVIS WITHOUT AND WITH CONTRAST  TECHNIQUE: Multidetector CT imaging of the abdomen and pelvis was performed following the standard protocol before and following the bolus administration of intravenous contrast.  CONTRAST: 125 mL Isovue 300 IV  COMPARISON: None.  FINDINGS: Lower chest: Mild dependent atelectasis the lung bases. Trace right pleural effusion.  The heart is top-normal in size. Trace pericardial effusion.  Hepatobiliary: Liver is within normal limits.  No suspicious/enhancing hepatic lesions.  Numerous noncalcified gallstones (series 2/ image 41). No associated wall thickening or inflammatory changes.  Pancreas: Within normal limits.  Spleen: Within normal limits.  Adrenals/Urinary Tract: Adrenal glands are within normal limits.  Right kidney is notable for a 2 mm nonobstructing interpolar calculus (series 2/ image 31) but is otherwise within normal limits. No hydronephrosis.  Left kidney is notable for moderate hydroureteronephrosis. No enhancing renal lesions.  6 mm distal left ureteral calculus at the level of the sacral ureter (series 500/ image 71). Additional 6 mm distal left ureteral calculus at the UVJ (series 2/image 79).  Otherwise, on delayed imaging, there are no filling defects in the bilateral opacified proximal collecting systems, ureters, or bladder.  Bladder is mildly thick-walled although underdistended.  Stomach/Bowel: Stomach is within normal limits.  No evidence of bowel obstruction.  Normal appendix.  Vascular/Lymphatic: No evidence of abdominal aortic aneurysm.  Reproductive: Status post hysterectomy.  No adnexal masses.  Other: No abdominopelvic ascites.  Musculoskeletal: Mild degenerative changes of the lumbar spine, most prominent at L4-5.  IMPRESSION: 6 mm distal left ureteral calculus at the UVJ. Additional 6 mm distal left ureteral calculus at the level of the sacral ureter. Associated moderate left hydroureteronephrosis.  2 mm nonobstructing interpolar right renal calculus.   Electronically Signed  By: Julian Hy M.D.  On: 12/04/2014 14:54     1. Amended By: Raynelle Bring; Dec 04 2014 6:44 PM EST  Signatures Electronically signed by : Raynelle Bring, M.D.; Dec 04 2014  6:44PM EST

## 2014-12-18 NOTE — Interval H&P Note (Signed)
History and Physical Interval Note:  12/18/2014 1:02 PM  Katherine [name]  has presented today for surgery, with the diagnosis of LEFT URETERAL CALCULI  The various methods of treatment have been discussed with the patient and family. After consideration of risks, benefits and other options for treatment, the patient has consented to  Procedure(s): CYSTOSCOPY WITH RETROGRADE PYELOGRAM, URETEROSCOPY AND STENT PLACEMENT (Left) HOLMIUM LASER APPLICATION (Left) as a surgical intervention .  The patient's history has been reviewed, patient examined, no change in status, stable for surgery.  I have reviewed the patient's chart and labs.  Questions were answered to the patient's satisfaction.     Chanice Brenton,LES

## 2014-12-18 NOTE — Anesthesia Postprocedure Evaluation (Signed)
  Anesthesia Post-op Note  Patient: Katherine [name]  Procedure(s) Performed: Procedure(s) (LRB): CYSTOSCOPY WITH RETROGRADE PYELOGRAM, URETEROSCOPY AND STENT PLACEMENT left ureter (Left) HOLMIUM LASER APPLICATION left ureter  (Left)  Patient Location: PACU  Anesthesia Type: General  Level of Consciousness: awake and alert   Airway and Oxygen Therapy: Patient Spontanous Breathing  Post-op Pain: mild  Post-op Assessment: Post-op Vital signs reviewed, Patient's Cardiovascular Status Stable, Respiratory Function Stable, Patent Airway and No signs of Nausea or vomiting  Last Vitals:  Filed Vitals:   12/18/14 1600  BP: 147/98  Pulse: 91  Temp: 36.6 C  Resp: 16    Post-op Vital Signs: stable   Complications: No apparent anesthesia complications

## 2014-12-18 NOTE — Anesthesia Preprocedure Evaluation (Addendum)
Anesthesia Evaluation  Patient identified by MRN, date of birth, ID band Patient awake    Reviewed: Allergy & Precautions, NPO status , Patient's Chart, lab work & pertinent test results  History of Anesthesia Complications (+) Family history of anesthesia reaction  Airway Mallampati: II  TM Distance: >3 FB Neck ROM: Full    Dental no notable dental hx.    Pulmonary neg pulmonary ROS,  breath sounds clear to auscultation  Pulmonary exam normal       Cardiovascular Exercise Tolerance: Good hypertension, Pt. on medications Normal cardiovascular examRhythm:Regular Rate:Normal     Neuro/Psych Neuropathy hands and feet.  Neuromuscular disease negative psych ROS   GI/Hepatic negative GI ROS, H/O elevated LFTs   Endo/Other  Morbid obesity  Renal/GU Renal disease  negative genitourinary   Musculoskeletal  (+) Arthritis -,   Abdominal (+) + obese,   Peds negative pediatric ROS (+)  Hematology negative hematology ROS (+)   Anesthesia Other Findings   Reproductive/Obstetrics negative OB ROS                            Anesthesia Physical Anesthesia Plan  ASA: III  Anesthesia Plan: General   Post-op Pain Management:    Induction: Intravenous  Airway Management Planned: LMA  Additional Equipment:   Intra-op Plan:   Post-operative Plan: Extubation in OR  Informed Consent: I have reviewed the patients History and Physical, chart, labs and discussed the procedure including the risks, benefits and alternatives for the proposed anesthesia with the patient or authorized representative who has indicated his/her understanding and acceptance.   Dental advisory given  Plan Discussed with: CRNA  Anesthesia Plan Comments:         Anesthesia Quick Evaluation

## 2014-12-18 NOTE — Anesthesia Procedure Notes (Signed)
Procedure Name: LMA Insertion Date/Time: 12/18/2014 1:28 PM Performed by: Sherian Maroon A Pre-anesthesia Checklist: Patient identified, Emergency Drugs available, Suction available, Patient being monitored and Timeout performed Patient Re-evaluated:Patient Re-evaluated prior to inductionOxygen Delivery Method: Circle system utilized Preoxygenation: Pre-oxygenation with 100% oxygen Intubation Type: IV induction Ventilation: Mask ventilation without difficulty LMA: LMA inserted LMA Size: 4.0 Number of attempts: 1 Placement Confirmation: positive ETCO2 and breath sounds checked- equal and bilateral

## 2014-12-18 NOTE — Op Note (Signed)
Preoperative diagnosis: Left ureteral calculus  Postoperative diagnosis: Left impacted ureteral calculus  Procedure:  1. Cystoscopy 2. Left ureteroscopy and stone removal 3. Ureteroscopic laser lithotripsy 4. Left ureteral stent placement (6 x 24) - no string due to impaction of stone 5. Left retrograde pyelography with interpretation  Surgeon: Pryor Curia. M.D.  Anesthesia: General  Complications: None  Intraoperative findings: Left retrograde pyelography demonstrated no filling defects in the distal ureter but there was no contrast that was able to be advanced past the mid ureter where her known other stone was located on preoperative imaging.  EBL: Minimal  Specimens: 1. Left ureteral calculus  Disposition of specimens: [facility] for stone analysis  Indication: Katherine [name] is a 64 y.o. year old patient with urolithiasis who recently presented with left ureteral calculi. After reviewing the management options for treatment, the patient elected to proceed with the above surgical procedure(s). We have discussed the potential benefits and risks of the procedure, side effects of the proposed treatment, the likelihood of the patient achieving the goals of the procedure, and any potential problems that might occur during the procedure or recuperation. Informed consent has been obtained.  Description of procedure:  The patient was taken to the operating room and general anesthesia was induced.  The patient was placed in the dorsal lithotomy position, prepped and draped in the usual sterile fashion, and preoperative antibiotics were administered. A preoperative time-out was performed.   Cystourethroscopy was performed.  The patient's urethra was examined and was normal. The bladder was then systematically examined in its entirety. There was no evidence for any bladder tumors, stones, or other mucosal pathology.    Attention then turned to the left  ureteral orifice and a ureteral catheter was used to intubate the ureteral orifice.  Omnipaque contrast was injected through the ureteral catheter and a retrograde pyelogram was performed with findings as dictated above.  A 0.38 sensor guidewire was then advanced up the left ureter into the renal pelvis under fluoroscopic guidance. The 6 Fr semirigid ureteroscope was then advanced into the ureter next to the guidewire.  No distal stone was present indicating that she likely had passed that stone.  Further advancement up to the mid ureter revealed an impacted mid ureteral calculus.   The stone was then fragmented with the 365 micron holmium laser fiber on a setting of 0.6 J and frequency of 6 Hz.   All stones were then removed from the ureter with a zero tip nitinol basket.  Reinspection of the ureter revealed no remaining visible stones or fragments.   The wire was then backloaded through the cystoscope and a ureteral stent was advance over the wire using Seldinger technique.  The stent was positioned appropriately under fluoroscopic and cystoscopic guidance.  The wire was then removed with an adequate stent curl noted in the renal pelvis as well as in the bladder.  The bladder was then emptied and the procedure ended.  The patient appeared to tolerate the procedure well and without complications.  The patient was able to be awakened and transferred to the recovery unit in satisfactory condition.

## 2014-12-19 ENCOUNTER — Encounter (HOSPITAL_COMMUNITY): Payer: Self-pay | Admitting: Urology

## 2014-12-31 ENCOUNTER — Other Ambulatory Visit: Payer: Self-pay | Admitting: Neurology

## 2015-01-09 ENCOUNTER — Encounter: Payer: Self-pay | Admitting: Nurse Practitioner

## 2015-01-09 ENCOUNTER — Ambulatory Visit (INDEPENDENT_AMBULATORY_CARE_PROVIDER_SITE_OTHER): Payer: Medicare Other | Admitting: Nurse Practitioner

## 2015-01-09 VITALS — BP 133/82 | HR 106 | Ht 65.0 in | Wt 234.6 lb

## 2015-01-09 DIAGNOSIS — G619 Inflammatory polyneuropathy, unspecified: Secondary | ICD-10-CM

## 2015-01-09 DIAGNOSIS — G609 Hereditary and idiopathic neuropathy, unspecified: Secondary | ICD-10-CM

## 2015-01-09 DIAGNOSIS — G622 Polyneuropathy due to other toxic agents: Secondary | ICD-10-CM

## 2015-01-09 MED ORDER — TRAMADOL HCL 50 MG PO TABS: ORAL_TABLET | ORAL | Status: DC

## 2015-01-09 MED ORDER — PREGABALIN 100 MG PO CAPS: 100.0000 mg | ORAL_CAPSULE | Freq: Two times a day (BID) | ORAL | Status: DC

## 2015-01-09 NOTE — Progress Notes (Addendum)
[facility]  PATIENT: Katherine [name] DOB: 1951/01/07   REASON FOR VISIT: Follow-up for peripheral neuropathy HISTORY FROM: Patient    HISTORY OF PRESENT ILLNESS:Katherine [name], 64 year old female returns for followup. She was last seen in this office 07/06/14. 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 She has recently experienced similar symptoms in her hands as well.. She is also on Lidocaine cream. She also takes occasional Ultram She continues to exercise 5 times a week . She returns for reevaluation . Her symptoms are well controlled on the current treatment regimen.   HISTORY: She has been followed here since April 2010 for sensory neuropathy, with numbness and pain in the feet and consistent NCV findings. Exam demonstrated mild sensory loss in the feet, worse on the right, and diminished ankle jerks. Neuropathy labs were unremarkable, except for a slightly elevated HbA1c of 5.9. Advised to take a multivitamin. Gabapentin provided incomplete relief, and she had some difficulties with drowsiness. Nortriptyline was added at doses increasing to 50 mg q.h.s. She reports ongoing soreness of feet, not bad, but had 2 bouts of severe pain lasting 30-45 minutes, mostly in evenings; given tramadol.  12/29/12 Remains on Lyrica 100mg  BID and Nortriptyline 75mg  at night. . She continues to have occasional foot pain, She takes tramadol for pain. Transdermal cream was stopped due to insurance issues, she is back on that. She has stopped her Metanx insurance would not pay She is doing water aerobics 3 to 4 times weekly. She continues to retain fluid around the ankles.     REVIEW OF SYSTEMS: Full 14 system review of systems performed and notable only for those listed, all others are neg:  Constitutional:  Fatigue Cardiovascular: neg Ear/Nose/Throat: neg  Skin: neg Eyes: neg Respiratory: neg Gastroitestinal: neg  Hematology/Lymphatic: neg  Endocrine: neg Musculoskeletal: Joint pain, muscle cramps Allergy/Immunology: neg Neurological: Numbness  Psychiatric: neg Sleep : neg   ALLERGIES: Allergies  Allergen Reactions  . Erythromycin     REACTION: chest pain    HOME MEDICATIONS: Outpatient Prescriptions Prior to Visit  Medication Sig Dispense Refill  . b complex vitamins tablet Take 1 tablet by mouth daily.    . Cholecalciferol (VITAMIN D-3 PO) Take 2,000 Units by mouth.     . cyanocobalamin 1000 MCG tablet Take 1,000 mcg by mouth daily.     Marland Kitchen ibuprofen (ADVIL,MOTRIN) 200 MG tablet Take 200 mg by mouth every 6 (six) hours as needed for moderate pain.     Marland Kitchen L-Methylfolate-Algae-B12-B6 (METANX) 3-90.314-2-35 MG CAPS Take one capsule by mouth two times daily 180 capsule 3  . lidocaine-prilocaine (EMLA) cream Apply 1-2 applications  daily to affected area 180 g 0  . lisinopril (PRINIVIL,ZESTRIL) 10 MG tablet Take 1 tablet (10 mg total) by mouth daily. 90 tablet 3  . Multiple Vitamin (MULTIVITAMIN) tablet Take 1 tablet by mouth daily.      . nortriptyline (PAMELOR) 25 MG capsule Take 3 capsules (75 mg total) by mouth at bedtime. 270 capsule 2  . Nutritional Supplements (ESTROVEN PO) Take 1 tablet by mouth daily.    . pregabalin (LYRICA) 100 MG capsule Take 1 capsule (100 mg total) by mouth 2 (two) times daily. 180 capsule 1  . traMADol (ULTRAM) 50 MG tablet Take one to two tablets by mouth every 6 hours if needed for pain (Patient taking  differently: Take one to two tablets by mouth every 6 hours if needed for pain, Prescribed by Neurology) 180 tablet 1  . HYDROcodone-acetaminophen (NORCO/VICODIN) 5-325 MG per tablet Take 1-2 tablets by mouth every 6 (six) hours as needed. (Patient not taking: Reported on 01/09/2015) 25 tablet 0  . nitrofurantoin, macrocrystal-monohydrate, (MACROBID) 100  MG capsule Take 1 capsule (100 mg total) by mouth 2 (two) times daily. (Patient not taking: Reported on 01/09/2015) 20 capsule 0   No facility-administered medications prior to visit.    PAST MEDICAL HISTORY: Past Medical History  Diagnosis Date  . Hyperlipidemia   . Chronic kidney disease     kidney stones  . Hypertension   . Diverticulosis of colon   . Elevated LFTs     neg for hep b and c in 2006  . Kidney infection   . History of mumps   . Yeast infection   . Bacterial infection   . Trichomonas   . Pelvic pain in female 10/27/10  . Arthritis     osteo  . Neuropathy     hands and feet  . Family history of adverse reaction to anesthesia     mother with sleep apnea    PAST SURGICAL HISTORY: Past Surgical History  Procedure Laterality Date  . Carpal tunnel release    . Abdominal hysterectomy    . Replacement total knee bilateral    . Rotator cuff repair    . Kidney stone surgery    . Trigger finger release  2010    right  . Knee surgery    . Vesicovaginal fistula closure w/ tah    . Dilation and curettage of uterus    . Hand surgery  06/2011    2 surgeries on left hand  . Wrist surgery  10/2011    right wrist  . Left elbow surgery    . Cystoscopy with retrograde pyelogram, ureteroscopy and stent placement Left 12/18/2014    Procedure: CYSTOSCOPY WITH RETROGRADE PYELOGRAM, URETEROSCOPY AND STENT PLACEMENT left ureter;  Surgeon: Raynelle Bring, MD;  Location: [facility];  Service: Urology;  Laterality: Left;  . Holmium laser application Left 06/13/7626    Procedure: HOLMIUM LASER APPLICATION left ureter ;  Surgeon: Raynelle Bring, MD;  Location: [facility];  Service: Urology;  Laterality: Left;    FAMILY HISTORY: Family History  Problem Relation Age of Onset  . Stroke Mother   . Cancer Mother 18    bone cancer  . Colon cancer Neg Hx   . Esophageal cancer Neg Hx   . Rectal cancer Neg Hx   . Stomach cancer Neg Hx   . Diabetes Sister     SOCIAL HISTORY: Social History    Social History  . Marital Status: Single    Spouse Name: N/A  . Number of Children: 1  . Years of Education: N/A   Occupational History  .     Social History Main Topics  . Smoking status: Never Smoker   . Smokeless tobacco: Never Used  . Alcohol Use: No  . Drug Use: No  . Sexual Activity: No   Other Topics Concern  . Not on file   Social History Narrative   Patient is single, has 1 child   Patient is right handed   Caffeine consumption is 0     PHYSICAL EXAM  Filed Vitals:   01/09/15 0839  BP: 133/82  Pulse: 106  Height: 5\' 5"  (1.651 m)  Weight: 234 lb 9.6 oz (106.414  kg)   Body mass index is 39.04 kg/(m^2). Generalized: Well developed, in no acute distress  Head: normocephalic and atraumatic,. Oropharynx benign  Neck: Supple, no carotid bruits  Cardiac: Regular rate rhythm, no murmur  Musculoskeletal: No deformity   Neurological examination   Mentation: Alert oriented to time, place, history taking. Attention span and concentration appropriate. Recent and remote memory intact. Follows all commands speech and language fluent.   Cranial nerve II-XII: Full Pupils were equal round reactive to light extraocular movements were full, visual field were full on confrontational test. Facial sensation and strength were normal. hearing was intact to finger rubbing bilaterally. Uvula tongue midline. head turning and shoulder shrug were normal and symmetric.Tongue protrusion into cheek strength was normal. Motor: normal bulk and tone, full strength in the BUE, BLE, fine finger movements normal, no pronator drift. No focal weakness Sensory: normal and symmetric to light touch, Vibration, proprioception , decreased pinprick in a stocking fashion to mid shin. Coordination: finger-nose-finger, heel-to-shin bilaterally, no dysmetria Reflexes: 1+ upper and lower and symmetric except absent ankle jerks plantar responses were flexor bilaterally. Gait and Station: Rising up  from seated position without assistance, normal stance, moderate stride, good arm swing, smooth turning, able to perform tiptoe, and heel walking without difficulty. Tandem gait is steady, no assistive device DIAGNOSTIC DATA (LABS, IMAGING, TESTING) - I reviewed patient records, labs, notes, testing and imaging myself where available.  Lab Results  Component Value Date   WBC 3.2* 12/10/2014   HGB 12.3 12/10/2014   HCT 36.8 12/10/2014   MCV 90.2 12/10/2014   PLT 159 12/10/2014      Component Value Date/Time   NA 138 12/10/2014 1105   K 4.3 12/10/2014 1105   CL 105 12/10/2014 1105   CO2 28 12/10/2014 1105   GLUCOSE 108* 12/10/2014 1105   BUN 13 12/10/2014 1105   CREATININE 0.90 12/10/2014 1105   CALCIUM 11.0* 12/10/2014 1105   CALCIUM 9.8 06/17/2009 2212   PROT 7.1 05/22/2014 0859   ALBUMIN 3.9 05/22/2014 0859   AST 58* 05/22/2014 0859   ALT 33 05/22/2014 0859   ALKPHOS 65 05/22/2014 0859   BILITOT 0.8 05/22/2014 0859   GFRNONAA >60 12/10/2014 1105   GFRAA >60 12/10/2014 1105   Lab Results  Component Value Date   CHOL 201* 05/22/2014   HDL 40.50 05/22/2014   LDLCALC 130* 05/22/2014   LDLDIRECT 139.1 02/11/2011   TRIG 155.0* 05/22/2014   CHOLHDL 5 05/22/2014     Lab Results  Component Value Date   TSH 1.15 05/22/2014      ASSESSMENT AND PLAN 64 y.o. year old female has a past medical history of peripheral neuropathy which is now extended into the hands. She is currently on Lyrica and nortriptyline Metanx and Lidoderm cream. She is continuing to exercise 5 times a week. Her symptoms are well controlled on this regimen.The patient is a current patient of Dr. Krista Blue  who is out of the office today . This note is sent to the work in doctor.     Continue nortriptyline at current dose Continue Lyrica at current dose, will refill Continue lidocaine cream refill Continue Metanx twice daily Continue Ultram when necessary will refill Continue exercise program Follow-up in  6 months Dennie Bible, [facility], [facility], APRN  [facility] [address] [place], [place] [address] [phone]  I reviewed the above note and documentation by the Nurse Practitioner and agree with the history, physical exam, assessment and plan as  outlined above. I was immediately available for face-to-face consultation. Star Age, MD, PhD [facility])

## 2015-01-09 NOTE — Patient Instructions (Addendum)
Continue nortriptyline at current dose Continue Lyrica at current dose Continue lidocaine cream refill Continue Metanx twice daily Continue exercise program Follow-up in 6 months

## 2015-01-16 LAB — HM COLONOSCOPY: HM Colonoscopy: NORMAL

## 2015-01-29 ENCOUNTER — Encounter: Payer: Self-pay | Admitting: Internal Medicine

## 2015-02-05 ENCOUNTER — Encounter: Payer: Self-pay | Admitting: Internal Medicine

## 2015-02-19 ENCOUNTER — Encounter: Payer: Self-pay | Admitting: Internal Medicine

## 2015-03-12 ENCOUNTER — Encounter: Payer: Self-pay | Admitting: Internal Medicine

## 2015-03-12 ENCOUNTER — Ambulatory Visit (INDEPENDENT_AMBULATORY_CARE_PROVIDER_SITE_OTHER): Payer: Medicare Other | Admitting: Internal Medicine

## 2015-03-12 ENCOUNTER — Other Ambulatory Visit (HOSPITAL_COMMUNITY): Payer: Self-pay | Admitting: Surgery

## 2015-03-12 VITALS — BP 110/70 | HR 100 | Temp 98.5°F | Resp 20 | Ht 65.0 in | Wt 236.0 lb

## 2015-03-12 DIAGNOSIS — I10 Essential (primary) hypertension: Secondary | ICD-10-CM

## 2015-03-12 DIAGNOSIS — G609 Hereditary and idiopathic neuropathy, unspecified: Secondary | ICD-10-CM

## 2015-03-12 DIAGNOSIS — E213 Hyperparathyroidism, unspecified: Secondary | ICD-10-CM

## 2015-03-12 DIAGNOSIS — Z23 Encounter for immunization: Secondary | ICD-10-CM | POA: Diagnosis not present

## 2015-03-12 DIAGNOSIS — E785 Hyperlipidemia, unspecified: Secondary | ICD-10-CM | POA: Diagnosis not present

## 2015-03-12 NOTE — Patient Instructions (Signed)
Limit your sodium (Salt) intake    It is important that you exercise regularly, at least 20 minutes 3 to 4 times per week.  If you develop chest pain or shortness of breath seek  medical attention.  General surgery follow-up as scheduled  Please check your blood pressure on a regular basis.  If it is consistently greater than 150/90, please make an office appointment.  Return in 6 months for follow-up

## 2015-03-12 NOTE — Progress Notes (Signed)
Pre visit review using our clinic review tool, if applicable. No additional management support is needed unless otherwise documented below in the visit note. 

## 2015-03-12 NOTE — Progress Notes (Signed)
Subjective:    Patient ID: Katherine [name], female    DOB: May 15, 1950, 64 y.o.   MRN: [id number]  HPI  64 year old patient who is seen today for her six-month follow-up.  She has a history of treated hypertension and also primary hyperparathyroidism.  Since her last visit here, she has required intervention for symptomatic renal stone disease and has been evaluated by surgery.  Today she feels well No cardiopulmonary complaints DEXA scans have been normal and calcium levels have been only modestly elevated  Past Medical History  Diagnosis Date  . Hyperlipidemia   . Chronic kidney disease     kidney stones  . Hypertension   . Diverticulosis of colon   . Elevated LFTs     neg for hep b and c in 2006  . Kidney infection   . History of mumps   . Yeast infection   . Bacterial infection   . Trichomonas   . Pelvic pain in female 10/27/10  . Arthritis     osteo  . Neuropathy (HCC)     hands and feet  . Family history of adverse reaction to anesthesia     mother with sleep apnea    Social History   Social History  . Marital Status: Single    Spouse Name: N/A  . Number of Children: 1  . Years of Education: N/A   Occupational History  .     Social History Main Topics  . Smoking status: Never Smoker   . Smokeless tobacco: Never Used  . Alcohol Use: No  . Drug Use: No  . Sexual Activity: No   Other Topics Concern  . Not on file   Social History Narrative   Patient is single, has 1 child   Patient is right handed   Caffeine consumption is 0    Past Surgical History  Procedure Laterality Date  . Carpal tunnel release    . Abdominal hysterectomy    . Replacement total knee bilateral    . Rotator cuff repair    . Kidney stone surgery    . Trigger finger release  2010    right  . Knee surgery    . Vesicovaginal fistula closure w/ tah    . Dilation and curettage of uterus    . Hand surgery  06/2011    2 surgeries on left hand  . Wrist surgery  10/2011    right  wrist  . Left elbow surgery    . Cystoscopy with retrograde pyelogram, ureteroscopy and stent placement Left 12/18/2014    Procedure: CYSTOSCOPY WITH RETROGRADE PYELOGRAM, URETEROSCOPY AND STENT PLACEMENT left ureter;  Surgeon: Raynelle Bring, MD;  Location: [facility];  Service: Urology;  Laterality: Left;  . Holmium laser application Left 11/14/7339    Procedure: HOLMIUM LASER APPLICATION left ureter ;  Surgeon: Raynelle Bring, MD;  Location: [facility];  Service: Urology;  Laterality: Left;    Family History  Problem Relation Age of Onset  . Stroke Mother   . Cancer Mother 50    bone cancer  . Colon cancer Neg Hx   . Esophageal cancer Neg Hx   . Rectal cancer Neg Hx   . Stomach cancer Neg Hx   . Diabetes Sister     Allergies  Allergen Reactions  . Erythromycin     REACTION: chest pain    Current Outpatient Prescriptions on File Prior to Visit  Medication Sig Dispense Refill  . b complex vitamins tablet Take 1 tablet  by mouth daily.    . Cholecalciferol (VITAMIN D-3 PO) Take 2,000 Units by mouth.     . cyanocobalamin 1000 MCG tablet Take 1,000 mcg by mouth daily.     Marland Kitchen ibuprofen (ADVIL,MOTRIN) 200 MG tablet Take 200 mg by mouth every 6 (six) hours as needed for moderate pain.     Marland Kitchen L-Methylfolate-Algae-B12-B6 (METANX) 3-90.314-2-35 MG CAPS Take one capsule by mouth two times daily 180 capsule 3  . lidocaine-prilocaine (EMLA) cream Apply 1-2 applications  daily to affected area 180 g 0  . lisinopril (PRINIVIL,ZESTRIL) 10 MG tablet Take 1 tablet (10 mg total) by mouth daily. 90 tablet 3  . Multiple Vitamin (MULTIVITAMIN) tablet Take 1 tablet by mouth daily.      . nortriptyline (PAMELOR) 25 MG capsule Take 3 capsules (75 mg total) by mouth at bedtime. 270 capsule 2  . Nutritional Supplements (ESTROVEN PO) Take 1 tablet by mouth daily.    . pregabalin (LYRICA) 100 MG capsule Take 1 capsule (100 mg total) by mouth 2 (two) times daily. 180 capsule 1  . traMADol (ULTRAM) 50 MG tablet Take one to  two tablets by mouth every 6 hours if needed for pain, Prescribed by Neurology 180 tablet 1  . [DISCONTINUED] gabapentin (NEURONTIN) 300 MG capsule Take 300 mg by mouth 3 (three) times daily.       No current facility-administered medications on file prior to visit.    BP 110/70 mmHg  Pulse 100  Temp(Src) 98.5 F (36.9 C) (Oral)  Resp 20  Ht 5\' 5"  (1.651 m)  Wt 236 lb (107.049 kg)  BMI 39.27 kg/m2  SpO2 98%     Review of Systems  Constitutional: Negative.   HENT: Negative for congestion, dental problem, hearing loss, rhinorrhea, sinus pressure, sore throat and tinnitus.   Eyes: Negative for pain, discharge and visual disturbance.  Respiratory: Negative for cough and shortness of breath.   Cardiovascular: Negative for chest pain, palpitations and leg swelling.  Gastrointestinal: Negative for nausea, vomiting, abdominal pain, diarrhea, constipation, blood in stool and abdominal distention.  Genitourinary: Negative for dysuria, urgency, frequency, hematuria, flank pain, vaginal bleeding, vaginal discharge, difficulty urinating, vaginal pain and pelvic pain.  Musculoskeletal: Negative for joint swelling, arthralgias and gait problem.  Skin: Negative for rash.  Neurological: Negative for dizziness, syncope, speech difficulty, weakness, numbness and headaches.  Hematological: Negative for adenopathy.  Psychiatric/Behavioral: Negative for behavioral problems, dysphoric mood and agitation. The patient is not nervous/anxious.        Objective:   Physical Exam  Constitutional: She is oriented to person, place, and time. She appears well-developed and well-nourished.  Obese Blood pressure 110/76  HENT:  Head: Normocephalic.  Right Ear: External ear normal.  Left Ear: External ear normal.  Mouth/Throat: Oropharynx is clear and moist.  Eyes: Conjunctivae and EOM are normal. Pupils are equal, round, and reactive to light.  Neck: Normal range of motion. Neck supple. No thyromegaly  present.  Cardiovascular: Normal rate, regular rhythm, normal heart sounds and intact distal pulses.   Pulmonary/Chest: Effort normal and breath sounds normal.  Abdominal: Soft. Bowel sounds are normal. She exhibits no mass. There is no tenderness.  Musculoskeletal: Normal range of motion.  Surgical scars both knees  Lymphadenopathy:    She has no cervical adenopathy.  Neurological: She is alert and oriented to person, place, and time.  Skin: Skin is warm and dry. No rash noted.  Psychiatric: She has a normal mood and affect. Her behavior is normal.  Assessment & Plan:   Hypertension, well-controlled Primary hyperparathyroidism.  Renal stone disease strong indication for surgery.  General surgery evaluation in progress Obesity.  Weight loss encouraged Osteoarthritis  Return in 6 months for her annual exam

## 2015-03-15 ENCOUNTER — Other Ambulatory Visit: Payer: Self-pay | Admitting: Neurology

## 2015-03-27 ENCOUNTER — Encounter (HOSPITAL_COMMUNITY)
Admission: RE | Admit: 2015-03-27 | Discharge: 2015-03-27 | Disposition: A | Discharge status: Home / Self Care | Payer: Medicare Other | Source: Ambulatory Visit | Attending: Surgery | Admitting: Surgery

## 2015-03-27 ENCOUNTER — Ambulatory Visit: Payer: Medicare Other | Admitting: Internal Medicine

## 2015-03-27 DIAGNOSIS — E213 Hyperparathyroidism, unspecified: Secondary | ICD-10-CM | POA: Insufficient documentation

## 2015-03-27 MED ORDER — TECHNETIUM TC 99M SESTAMIBI GENERIC - CARDIOLITE
25.0000 | Freq: Once | INTRAVENOUS | Status: AC | PRN
  Administered 2015-03-27: 25 via INTRAVENOUS

## 2015-04-02 ENCOUNTER — Other Ambulatory Visit: Payer: Self-pay | Admitting: Surgery

## 2015-04-02 DIAGNOSIS — E213 Hyperparathyroidism, unspecified: Secondary | ICD-10-CM

## 2015-04-10 ENCOUNTER — Ambulatory Visit
Admission: RE | Admit: 2015-04-10 | Discharge: 2015-04-10 | Disposition: A | Discharge status: Home / Self Care | Payer: Medicare Other | Source: Ambulatory Visit | Attending: Surgery | Admitting: Surgery

## 2015-04-10 DIAGNOSIS — E213 Hyperparathyroidism, unspecified: Secondary | ICD-10-CM

## 2015-04-16 ENCOUNTER — Telehealth: Payer: Self-pay | Admitting: Neurology

## 2015-04-16 MED ORDER — METANX 3-90.314-2-35 MG PO CAPS: ORAL_CAPSULE | ORAL | Status: DC

## 2015-04-16 NOTE — Telephone Encounter (Signed)
It does not appear we have gotten any requests.  Rx has been sent, receipt confirmed by pharmacy.  I called back and spoke with the patient.  She is aware Rx has been sent.  Said the pharmacy told her they have been having computer issues, so the requests likely did not send.

## 2015-04-16 NOTE — Telephone Encounter (Signed)
Pt called requesting refill for L-Methylfolate-Algae-B12-B6 (METANX) 3-90.314-2-35 MG CAPS  Pt has been out for 3wks. 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 She was just told they haven't sent request. Thank you

## 2015-04-22 ENCOUNTER — Ambulatory Visit: Payer: Self-pay | Admitting: Surgery

## 2015-04-30 ENCOUNTER — Encounter (HOSPITAL_COMMUNITY): Payer: Self-pay | Admitting: Surgery

## 2015-04-30 NOTE — H&P (Signed)
General Surgery [facility]  Katherine [name] DOB: July 04, 1950 Single / Language: Katherine [name] / Race: Black or African American Female  History of Present Illness  The patient is a 64 year old female who presents with a parathyroid neoplasm.  Patient referred by Dr. Raynelle Bring for evaluation of suspected primary hyperparathyroidism. Patient has a long-standing history of nephrolithiasis dating back more than 10 years. Stone analysis shows calcium oxalate. Laboratory studies show a mildly elevated calcium level of 10.8 along with an elevated intact PTH level of 89. 24-hour urine collection for calcium showed hypercalciuria. Patient had a recent episode of nephrolithiasis requiring operative intervention. Patient has had a bone density scan which does not show evidence of osteopenia or osteoporosis. Patient has had no other imaging studies recently. Patient has a family history of thyroid disease in multiple family members and she believes that her mother had problems with a pituitary tumor. Patient has had no surgery on the neck other than tonsillectomy as a child. Patient has never been on thyroid medication.  Other Problems Arthritis Back Pain Bladder Problems Diverticulosis High blood pressure Kidney Stone  Past Surgical History Hysterectomy (not due to cancer) - Partial Knee Surgery Bilateral. Oral Surgery Shoulder Surgery Bilateral. Tonsillectomy  Diagnostic Studies History Colonoscopy 1-5 years ago Mammogram within last year  Allergies Erythromycin Estolate *CHEMICALS* Chest pain.  Medication History TraMADol HCl (50MG  Tablet, Oral prn) Active. Lyrica (100MG  Capsule, Oral daily) Active. Lisinopril (10MG  Tablet, Oral daily) Active. Nortriptyline HCl (25MG  Capsule, Oral daily) Active. Cholecalciferol (2000UNIT Tablet, Oral daily) Active. Multivitamin (Oral daily) Active. [employer] (Oral prn) Active. Metanx  (3-90.314-2-35MG  Capsule, Oral daily) Active. B Complex (Oral daily) Active. Medications Reconciled  Social History Alcohol use Remotely quit alcohol use. No caffeine use No drug use Tobacco use Never smoker.  Family History Arthritis Brother, Daughter, Mother, Sister. Cancer Mother. Depression Mother, Sister. Diabetes Mellitus Sister. Hypertension Brother, Family Members In [place], Mother, Sister. Ovarian Cancer Mother. Respiratory Condition Mother. Thyroid problems Daughter, Family Members In General, Mother, Sister.  Pregnancy / Birth History Age at menarche 72 years. Age of menopause <43 Gravida 1 Maternal age 92-20 Para 1  Review of Systems General Present- Night Sweats and Weight Gain. Not Present- Appetite Loss, Chills, Fatigue, Fever and Weight Loss. Skin Not Present- Change in Wart/Mole, Dryness, Hives, Jaundice, New Lesions, Non-Healing Wounds, Rash and Ulcer. HEENT Present- Nose Bleed and Wears glasses/contact lenses. Not Present- Earache, Hearing Loss, Hoarseness, Oral Ulcers, Ringing in the Ears, Seasonal Allergies, Sinus Pain, Sore Throat, Visual Disturbances and Yellow Eyes. Respiratory Not Present- Bloody sputum, Chronic Cough, Difficulty Breathing, Snoring and Wheezing. Breast Not Present- Breast Mass, Breast Pain, Nipple Discharge and Skin Changes. Cardiovascular Present- Leg Cramps and Swelling of Extremities. Not Present- Chest Pain, Difficulty Breathing Lying Down, Palpitations, Rapid Heart Rate and Shortness of Breath. Gastrointestinal Not Present- Abdominal Pain, Bloating, Bloody Stool, Change in Bowel Habits, Chronic diarrhea, Constipation, Difficulty Swallowing, Excessive gas, Gets full quickly at meals, Hemorrhoids, Indigestion, Nausea, Rectal Pain and Vomiting. Female Genitourinary Present- Frequency. Not Present- Nocturia, Painful Urination, Pelvic Pain and Urgency. Musculoskeletal Present- Back Pain, Joint Pain, Joint Stiffness,  Muscle Pain and Swelling of Extremities. Not Present- Muscle Weakness. Neurological Present- Numbness, Tremor and Trouble walking. Not Present- Decreased Memory, Fainting, Headaches, Seizures, Tingling and Weakness. Psychiatric Not Present- Anxiety, Bipolar, Change in Sleep Pattern, Depression, Fearful and Frequent crying. Endocrine Present- Cold Intolerance, Heat Intolerance and Hot flashes. Not Present- Excessive Hunger, Hair Changes and New Diabetes. Hematology Not Present-  Easy Bruising, Excessive bleeding, Gland problems, HIV and Persistent Infections.  Vitals  Weight: 233.2 lb Height: 65in Body Surface Area: 2.11 m Body Mass Index: 38.81 kg/m  Temp.: 98.38F(Oral)  Pulse: 96 (Regular)  BP: 112/80 (Sitting, Left Arm, Standard)   Physical Exam  General - appears comfortable, no distress; not diaphorectic  HEENT - normocephalic; sclerae clear, gaze conjugate; mucous membranes moist, dentition good; voice normal  Neck - symmetric on extension; no palpable anterior or posterior cervical adenopathy; no palpable masses in the thyroid bed  Chest - clear bilaterally without rhonchi, rales, or wheeze  Cor - regular rhythm with normal rate; no significant murmur  Ext - non-tender with mild edema or lymphedema  Neuro - grossly intact; no tremor    Assessment & Plan  HYPERPARATHYROIDISM (E21.3)  Patient presents with hypercalcemia and an elevated intact PTH level suspicious for primary hyperparathyroidism. She has had multiple episodes of nephrolithiasis. Patient is provided with written literature on parathyroid disease to review at home.  I have recommended proceeding with nuclear medicine parathyroid scanning in hopes of identifying a parathyroid adenoma and confirming the diagnosis of primary hyperparathyroidism. If the scan localizes an adenoma, I believe the patient will be a good candidate for minimally invasive surgery for which we have discussed. If the scan is  negative, we will obtain an ultrasound examination of the neck in hopes of identifying an adenoma.  The risks and benefits of the procedure have been discussed at length with the patient.  The patient understands the proposed procedure, potential alternative treatments, and the course of recovery to be expected.  All of the patient's questions have been answered at this time.  The patient wishes to proceed with surgery.  Earnstine Regal, MD, [facility] Office: [phone]

## 2015-05-01 ENCOUNTER — Encounter (HOSPITAL_COMMUNITY): Payer: Self-pay

## 2015-05-01 ENCOUNTER — Encounter (HOSPITAL_COMMUNITY)
Admission: RE | Admit: 2015-05-01 | Discharge: 2015-05-01 | Disposition: A | Discharge status: Home / Self Care | Payer: Medicare Other | Source: Ambulatory Visit | Attending: Surgery | Admitting: Surgery

## 2015-05-01 DIAGNOSIS — Z79899 Other long term (current) drug therapy: Secondary | ICD-10-CM | POA: Diagnosis not present

## 2015-05-01 DIAGNOSIS — D351 Benign neoplasm of parathyroid gland: Secondary | ICD-10-CM | POA: Diagnosis not present

## 2015-05-01 DIAGNOSIS — E210 Primary hyperparathyroidism: Secondary | ICD-10-CM | POA: Diagnosis present

## 2015-05-01 DIAGNOSIS — M1990 Unspecified osteoarthritis, unspecified site: Secondary | ICD-10-CM | POA: Diagnosis not present

## 2015-05-01 DIAGNOSIS — I10 Essential (primary) hypertension: Secondary | ICD-10-CM | POA: Diagnosis not present

## 2015-05-01 DIAGNOSIS — Z87442 Personal history of urinary calculi: Secondary | ICD-10-CM | POA: Diagnosis not present

## 2015-05-01 LAB — COMPREHENSIVE METABOLIC PANEL
ALBUMIN: 3.7 g/dL (ref 3.5–5.0)
ALT: 26 U/L (ref 14–54)
AST: 57 U/L — AB (ref 15–41)
Alkaline Phosphatase: 80 U/L (ref 38–126)
Anion gap: 7 (ref 5–15)
BUN: 8 mg/dL (ref 6–20)
CHLORIDE: 107 mmol/L (ref 101–111)
CO2: 27 mmol/L (ref 22–32)
CREATININE: 0.8 mg/dL (ref 0.44–1.00)
Calcium: 10.9 mg/dL — ABNORMAL HIGH (ref 8.9–10.3)
GFR calc Af Amer: >60 mL/min (ref >60)
GFR calc non Af Amer: >60 mL/min (ref >60)
Glucose, Bld: 129 mg/dL — ABNORMAL HIGH (ref 65–99)
POTASSIUM: 4.1 mmol/L (ref 3.5–5.1)
SODIUM: 141 mmol/L (ref 135–145)
Total Bilirubin: 0.7 mg/dL (ref 0.3–1.2)
Total Protein: 6.4 g/dL — ABNORMAL LOW (ref 6.5–8.1)

## 2015-05-01 LAB — CBC
HEMATOCRIT: 38.7 % (ref 36.0–46.0)
HEMOGLOBIN: 13.3 g/dL (ref 12.0–15.0)
MCH: 30.6 pg (ref 26.0–34.0)
MCHC: 34.4 g/dL (ref 30.0–36.0)
MCV: 89 fL (ref 78.0–100.0)
Platelets: 157 10*3/uL (ref 150–400)
RBC: 4.35 MIL/uL (ref 3.87–5.11)
RDW: 13.5 % (ref 11.5–15.5)
WBC: 4.3 10*3/uL (ref 4.0–10.5)

## 2015-05-01 NOTE — Pre-Procedure Instructions (Addendum)
Katherine [name]  05/01/2015      [place], [place] - [address] [address] [facility] [address] [place] [address] Phone: [phone] Fax: [phone]  [place], [place] [address] [place] [address] [place] [address] Phone: [phone] Fax: [place], [place] [address] Phone: [phone] Fax: [phone]    Your procedure is scheduled on    Friday  05/03/15  Report to [facility] at   945  A.M.  Call this number if you have problems the morning of surgery:  [phone]     Remember:  Do not eat food or drink liquids after midnight.  Take these medicines the morning of surgery with A SIP OF WATER   TRAMADOL/ ULTRAM    (STOP ASPIRIN, COUMADIN, PLAVIX, EFFIENT, HERBAL MEDICINES, VITAMINS, IBUPROFEN/ ADVIL/ MOTRIN, MULTIVITAMIN)   Do not wear jewelry, make-up or nail polish.  Do not wear lotions, powders, or perfumes.  You may wear deodorant.  Do not shave 48 hours prior to surgery.  Men may shave face and neck.  Do not bring valuables to the hospital.  [facility] is not responsible for any belongings or valuables.  Contacts, dentures or bridgework may not be worn into surgery.  Leave your suitcase in the car.  After surgery it may be brought to your room.  For patients admitted to the hospital, discharge time will be determined by your treatment team.  Patients discharged the day of surgery will not be allowed to drive home.   Name and phone number of your driver:   Special instructions:  [facility] - Preparing for Surgery  Before surgery, you can play an important role.  Because skin is not sterile, your skin needs to be as free of germs as possible.  You can reduce the number of germs on you skin by washing with CHG (chlorahexidine gluconate) soap before surgery.  CHG is an  antiseptic cleaner which kills germs and bonds with the skin to continue killing germs even after washing.  Please DO NOT use if you have an allergy to CHG or antibacterial soaps.  If your skin becomes reddened/irritated stop using the CHG and inform your nurse when you arrive at Short Stay.  Do not shave (including legs and underarms) for at least 48 hours prior to the first CHG shower.  You may shave your face.  Please follow these instructions carefully:   1.  Shower with CHG Soap the night before surgery and the                                morning of Surgery.  2.  If you choose to wash your hair, wash your hair first as usual with your       normal shampoo.  3.  After you shampoo, rinse your hair and body thoroughly to remove the                      Shampoo.  4.  Use CHG as you would any other liquid soap.  You can apply chg directly       to the skin and wash gently with scrungie or a clean washcloth.  5.  Apply the CHG Soap to your body ONLY FROM THE NECK  DOWN.        Do not use on open wounds or open sores.  Avoid contact with your eyes,       ears, mouth and genitals (private parts).  Wash genitals (private parts)       with your normal soap.  6.  Wash thoroughly, paying special attention to the area where your surgery        will be performed.  7.  Thoroughly rinse your body with warm water from the neck down.  8.  DO NOT shower/wash with your normal soap after using and rinsing off       the CHG Soap.  9.  Pat yourself dry with a clean towel.            10.  Wear clean pajamas.            11.  Place clean sheets on your bed the night of your first shower and do not        sleep with pets.  Day of Surgery  Do not apply any lotions/deoderants the morning of surgery.  Please wear clean clothes to the hospital/surgery center.    Please read over the following fact sheets that you were given. Pain Booklet, Coughing and Deep Breathing and Surgical Site Infection  Prevention

## 2015-05-02 MED ORDER — CEFAZOLIN SODIUM-DEXTROSE 2-3 GM-% IV SOLR
2.0000 g | INTRAVENOUS | Status: AC
  Administered 2015-05-03: 2 g via INTRAVENOUS
  Filled 2015-05-02: qty 50

## 2015-05-03 ENCOUNTER — Encounter (HOSPITAL_COMMUNITY)
Admission: RE | Disposition: A | Discharge status: Home / Self Care | Payer: Self-pay | Source: Ambulatory Visit | Attending: Surgery

## 2015-05-03 ENCOUNTER — Ambulatory Visit (HOSPITAL_COMMUNITY): Payer: Medicare Other | Admitting: Certified Registered Nurse Anesthetist

## 2015-05-03 ENCOUNTER — Ambulatory Visit (HOSPITAL_COMMUNITY)
Admission: RE | Admit: 2015-05-03 | Discharge: 2015-05-03 | Disposition: A | Discharge status: Home / Self Care | Payer: Medicare Other | Source: Ambulatory Visit | Attending: Surgery | Admitting: Surgery

## 2015-05-03 ENCOUNTER — Encounter (HOSPITAL_COMMUNITY): Payer: Self-pay | Admitting: Certified Registered Nurse Anesthetist

## 2015-05-03 DIAGNOSIS — E213 Hyperparathyroidism, unspecified: Secondary | ICD-10-CM | POA: Diagnosis present

## 2015-05-03 DIAGNOSIS — I10 Essential (primary) hypertension: Secondary | ICD-10-CM | POA: Insufficient documentation

## 2015-05-03 DIAGNOSIS — E210 Primary hyperparathyroidism: Secondary | ICD-10-CM | POA: Diagnosis not present

## 2015-05-03 DIAGNOSIS — M1990 Unspecified osteoarthritis, unspecified site: Secondary | ICD-10-CM | POA: Diagnosis not present

## 2015-05-03 DIAGNOSIS — D351 Benign neoplasm of parathyroid gland: Secondary | ICD-10-CM | POA: Diagnosis not present

## 2015-05-03 DIAGNOSIS — Z79899 Other long term (current) drug therapy: Secondary | ICD-10-CM | POA: Insufficient documentation

## 2015-05-03 DIAGNOSIS — Z87442 Personal history of urinary calculi: Secondary | ICD-10-CM | POA: Insufficient documentation

## 2015-05-03 HISTORY — PX: PARATHYROIDECTOMY: SHX19

## 2015-05-03 SURGERY — PARATHYROIDECTOMY
Anesthesia: General | Site: Neck | Laterality: Right

## 2015-05-03 MED ORDER — LIDOCAINE HCL 4 % EX SOLN
CUTANEOUS | Status: DC | PRN
  Administered 2015-05-03: 4 mL via TOPICAL

## 2015-05-03 MED ORDER — NEOSTIGMINE METHYLSULFATE 10 MG/10ML IV SOLN
INTRAVENOUS | Status: DC | PRN
  Administered 2015-05-03: 4 mg via INTRAVENOUS

## 2015-05-03 MED ORDER — ROCURONIUM BROMIDE 50 MG/5ML IV SOLN
INTRAVENOUS | Status: AC
  Filled 2015-05-03: qty 1

## 2015-05-03 MED ORDER — PROPOFOL 10 MG/ML IV BOLUS
INTRAVENOUS | Status: DC | PRN
  Administered 2015-05-03: 140 mg via INTRAVENOUS

## 2015-05-03 MED ORDER — FENTANYL CITRATE (PF) 100 MCG/2ML IJ SOLN
INTRAMUSCULAR | Status: AC
  Filled 2015-05-03: qty 2

## 2015-05-03 MED ORDER — ROCURONIUM BROMIDE 100 MG/10ML IV SOLN
INTRAVENOUS | Status: DC | PRN
Start: 2015-05-03 — End: 2015-05-03
  Administered 2015-05-03: 50 mg via INTRAVENOUS

## 2015-05-03 MED ORDER — 0.9 % SODIUM CHLORIDE (POUR BTL) OPTIME
TOPICAL | Status: DC | PRN
  Administered 2015-05-03: 1000 mL

## 2015-05-03 MED ORDER — HEMOSTATIC AGENTS (NO CHARGE) OPTIME
TOPICAL | Status: DC | PRN
  Administered 2015-05-03: 1

## 2015-05-03 MED ORDER — LIDOCAINE HCL (CARDIAC) 20 MG/ML IV SOLN
INTRAVENOUS | Status: DC | PRN
  Administered 2015-05-03: 60 mg via INTRAVENOUS

## 2015-05-03 MED ORDER — ACETAMINOPHEN 325 MG PO TABS: 325.0000 mg | ORAL_TABLET | ORAL | Status: DC | PRN

## 2015-05-03 MED ORDER — OXYCODONE HCL 5 MG PO TABS
ORAL_TABLET | ORAL | Status: AC
  Filled 2015-05-03: qty 1

## 2015-05-03 MED ORDER — MIDAZOLAM HCL 5 MG/5ML IJ SOLN
INTRAMUSCULAR | Status: DC | PRN
  Administered 2015-05-03: 2 mg via INTRAVENOUS

## 2015-05-03 MED ORDER — PROPOFOL 10 MG/ML IV BOLUS
INTRAVENOUS | Status: AC
  Filled 2015-05-03: qty 20

## 2015-05-03 MED ORDER — OXYCODONE HCL 5 MG/5ML PO SOLN: 5.0000 mg | Freq: Once | ORAL | Status: AC | PRN

## 2015-05-03 MED ORDER — ARTIFICIAL TEARS OP OINT
TOPICAL_OINTMENT | OPHTHALMIC | Status: DC | PRN
  Administered 2015-05-03: 1 via OPHTHALMIC

## 2015-05-03 MED ORDER — HYDROCODONE-ACETAMINOPHEN 5-325 MG PO TABS: 1.0000 | ORAL_TABLET | ORAL | Status: DC | PRN

## 2015-05-03 MED ORDER — BUPIVACAINE HCL (PF) 0.25 % IJ SOLN
INTRAMUSCULAR | Status: DC | PRN
  Administered 2015-05-03: 9 mL

## 2015-05-03 MED ORDER — MIDAZOLAM HCL 2 MG/2ML IJ SOLN
INTRAMUSCULAR | Status: AC
  Filled 2015-05-03: qty 2

## 2015-05-03 MED ORDER — ARTIFICIAL TEARS OP OINT
TOPICAL_OINTMENT | OPHTHALMIC | Status: AC
  Filled 2015-05-03: qty 3.5

## 2015-05-03 MED ORDER — OXYCODONE HCL 5 MG PO TABS
5.0000 mg | ORAL_TABLET | Freq: Once | ORAL | Status: AC | PRN
  Administered 2015-05-03: 5 mg via ORAL

## 2015-05-03 MED ORDER — FENTANYL CITRATE (PF) 100 MCG/2ML IJ SOLN
INTRAMUSCULAR | Status: DC | PRN
  Administered 2015-05-03: 50 ug via INTRAVENOUS
  Administered 2015-05-03: 100 ug via INTRAVENOUS

## 2015-05-03 MED ORDER — GLYCOPYRROLATE 0.2 MG/ML IJ SOLN
INTRAMUSCULAR | Status: AC
  Filled 2015-05-03: qty 3

## 2015-05-03 MED ORDER — LIDOCAINE HCL (CARDIAC) 20 MG/ML IV SOLN
INTRAVENOUS | Status: AC
  Filled 2015-05-03: qty 5

## 2015-05-03 MED ORDER — ONDANSETRON HCL 4 MG/2ML IJ SOLN
INTRAMUSCULAR | Status: AC
  Filled 2015-05-03: qty 2

## 2015-05-03 MED ORDER — ONDANSETRON HCL 4 MG/2ML IJ SOLN
INTRAMUSCULAR | Status: DC | PRN
  Administered 2015-05-03: 4 mg via INTRAVENOUS

## 2015-05-03 MED ORDER — NEOSTIGMINE METHYLSULFATE 10 MG/10ML IV SOLN
INTRAVENOUS | Status: AC
  Filled 2015-05-03: qty 1

## 2015-05-03 MED ORDER — GLYCOPYRROLATE 0.2 MG/ML IJ SOLN
INTRAMUSCULAR | Status: DC | PRN
  Administered 2015-05-03: 0.6 mg via INTRAVENOUS

## 2015-05-03 MED ORDER — BUPIVACAINE HCL (PF) 0.25 % IJ SOLN
INTRAMUSCULAR | Status: AC
  Filled 2015-05-03: qty 30

## 2015-05-03 MED ORDER — FENTANYL CITRATE (PF) 100 MCG/2ML IJ SOLN
25.0000 ug | INTRAMUSCULAR | Status: DC | PRN
  Administered 2015-05-03: 25 ug via INTRAVENOUS

## 2015-05-03 MED ORDER — LACTATED RINGERS IV SOLN
INTRAVENOUS | Status: DC
  Administered 2015-05-03: 10:00:00 via INTRAVENOUS

## 2015-05-03 MED ORDER — ACETAMINOPHEN 160 MG/5ML PO SOLN
325.0000 mg | ORAL | Status: DC | PRN
  Filled 2015-05-03: qty 20.3

## 2015-05-03 MED ORDER — FENTANYL CITRATE (PF) 250 MCG/5ML IJ SOLN
INTRAMUSCULAR | Status: AC
  Filled 2015-05-03: qty 5

## 2015-05-03 SURGICAL SUPPLY — 53 items
APL SKNCLS STERI-STRIP NONHPOA (GAUZE/BANDAGES/DRESSINGS) ×1
ATTRACTOMAT 16X20 MAGNETIC DRP (DRAPES) ×3 IMPLANT
BENZOIN TINCTURE PRP APPL 2/3 (GAUZE/BANDAGES/DRESSINGS) ×3 IMPLANT
BLADE SURG 10 STRL SS (BLADE) ×3 IMPLANT
BLADE SURG 15 STRL LF DISP TIS (BLADE) ×1 IMPLANT
BLADE SURG 15 STRL SS (BLADE) ×3
CANISTER SUCTION 2500CC (MISCELLANEOUS) ×3 IMPLANT
CHLORAPREP W/TINT 26ML (MISCELLANEOUS) ×3 IMPLANT
CLIP TI MEDIUM 6 (CLIP) ×3 IMPLANT
CLIP TI WIDE RED SMALL 6 (CLIP) ×3 IMPLANT
CLOSURE WOUND 1/2 X4 (GAUZE/BANDAGES/DRESSINGS) ×1
CONT SPEC 4OZ CLIKSEAL STRL BL (MISCELLANEOUS) ×5 IMPLANT
COVER SURGICAL LIGHT HANDLE (MISCELLANEOUS) ×3 IMPLANT
CRADLE DONUT ADULT HEAD (MISCELLANEOUS) ×3 IMPLANT
DRAPE PED LAPAROTOMY (DRAPES) ×3 IMPLANT
DRAPE UTILITY XL STRL (DRAPES) ×3 IMPLANT
ELECT CAUTERY BLADE 6.4 (BLADE) ×3 IMPLANT
ELECT REM PT RETURN 9FT ADLT (ELECTROSURGICAL) ×3
ELECTRODE REM PT RTRN 9FT ADLT (ELECTROSURGICAL) ×1 IMPLANT
GAUZE SPONGE 2X2 8PLY STRL LF (GAUZE/BANDAGES/DRESSINGS) ×1 IMPLANT
GAUZE SPONGE 4X4 16PLY XRAY LF (GAUZE/BANDAGES/DRESSINGS) ×3 IMPLANT
GLOVE BIOGEL PI IND STRL 7.0 (GLOVE) IMPLANT
GLOVE BIOGEL PI INDICATOR 7.0 (GLOVE) ×2
GLOVE SURG ORTHO 8.0 STRL STRW (GLOVE) ×3 IMPLANT
GLOVE SURG SS PI 6.5 STRL IVOR (GLOVE) ×2 IMPLANT
GOWN STRL REUS W/ TWL LRG LVL3 (GOWN DISPOSABLE) ×1 IMPLANT
GOWN STRL REUS W/ TWL XL LVL3 (GOWN DISPOSABLE) ×1 IMPLANT
GOWN STRL REUS W/TWL LRG LVL3 (GOWN DISPOSABLE) ×6
GOWN STRL REUS W/TWL XL LVL3 (GOWN DISPOSABLE) ×3
HEMOSTAT SURGICEL 2X4 FIBR (HEMOSTASIS) ×5 IMPLANT
KIT BASIN OR (CUSTOM PROCEDURE TRAY) ×3 IMPLANT
KIT ROOM TURNOVER OR (KITS) ×3 IMPLANT
NDL HYPO 25GX1X1/2 BEV (NEEDLE) ×1 IMPLANT
NEEDLE HYPO 25GX1X1/2 BEV (NEEDLE) ×6 IMPLANT
NS IRRIG 1000ML POUR BTL (IV SOLUTION) ×3 IMPLANT
PACK SURGICAL SETUP 50X90 (CUSTOM PROCEDURE TRAY) ×3 IMPLANT
PAD ARMBOARD 7.5X6 YLW CONV (MISCELLANEOUS) ×3 IMPLANT
PENCIL BUTTON HOLSTER BLD 10FT (ELECTRODE) ×3 IMPLANT
SPONGE GAUZE 2X2 STER 10/PKG (GAUZE/BANDAGES/DRESSINGS) ×2
SPONGE INTESTINAL PEANUT (DISPOSABLE) IMPLANT
STRIP CLOSURE SKIN 1/2X4 (GAUZE/BANDAGES/DRESSINGS) ×2 IMPLANT
SUT MNCRL AB 4-0 PS2 18 (SUTURE) ×3 IMPLANT
SUT SILK 2 0 (SUTURE)
SUT SILK 2-0 18XBRD TIE 12 (SUTURE) IMPLANT
SUT SILK 3 0 (SUTURE)
SUT SILK 3-0 18XBRD TIE 12 (SUTURE) IMPLANT
SUT VIC AB 3-0 SH 18 (SUTURE) ×5 IMPLANT
SYR BULB 3OZ (MISCELLANEOUS) ×3 IMPLANT
SYR CONTROL 10ML LL (SYRINGE) IMPLANT
TOWEL OR 17X24 6PK STRL BLUE (TOWEL DISPOSABLE) ×3 IMPLANT
TOWEL OR 17X26 10 PK STRL BLUE (TOWEL DISPOSABLE) ×3 IMPLANT
TUBE CONNECTING 12'X1/4 (SUCTIONS) ×1
TUBE CONNECTING 12X1/4 (SUCTIONS) ×2 IMPLANT

## 2015-05-03 NOTE — Interval H&P Note (Signed)
History and Physical Interval Note:  05/03/2015 10:25 AM  Katherine [name]  has presented today for surgery, with the diagnosis of Primary hyperparathyroidism.  The various methods of treatment have been discussed with the patient and family. After consideration of risks, benefits and other options for treatment, the patient has consented to    Procedure(s): RIGHT SUPERIOR PARATHYROIDECTOMY (Right) as a surgical intervention .    The patient's history has been reviewed, patient examined, no change in status, stable for surgery.  I have reviewed the patient's chart and labs.  Questions were answered to the patient's satisfaction.    Earnstine Regal, MD, [facility] Office: [place]

## 2015-05-03 NOTE — Op Note (Signed)
OPERATIVE REPORT - PARATHYROIDECTOMY  Preoperative diagnosis: Primary hyperparathyroidism  Postop diagnosis: Same  Procedure: Right superior minimally invasive parathyroidectomy  Surgeon:  Earnstine Regal, MD, FACS  Anesthesia: Gen. endotracheal  Estimated blood loss: Minimal  Preparation: ChloraPrep  Indications: Patient referred by Dr. Raynelle Bring for evaluation of suspected primary hyperparathyroidism. Patient has a long-standing history of nephrolithiasis dating back more than 10 years. Stone analysis shows calcium oxalate. Laboratory studies show a mildly elevated calcium level of 10.8 along with an elevated intact PTH level of 89. 24-hour urine collection for calcium showed hypercalciuria. Patient had a recent episode of nephrolithiasis requiring operative intervention. Nuclear med scan shows activity in the right superior position.  This was confirmed by USN exam.  Procedure: Patient was prepared in the holding area. He was brought to operating room and placed in a supine position on the operating room table. Following administration of general anesthesia, the patient was positioned and then prepped and draped in the usual strict aseptic fashion. After ascertaining that an adequate level of anesthesia been achieved, a neck incision was made with a #15 blade. Dissection was carried through subcutaneous tissues and platysma. Hemostasis was obtained with the electrocautery. Skin flaps were developed circumferentially and a Weitlander retractor was placed for exposure.  Strap muscles were incised in the midline. Strap muscles were reflected exposing the thyroid lobe. With gentle blunt dissection the thyroid lobe was mobilized.  Dissection was carried through adipose tissue and an enlarged parathyroid gland was identified. It was gently mobilized. Vascular structures were divided between small and medium ligaclips. Care was taken to avoid the recurrent laryngeal nerve and the esophagus.  The parathyroid gland was completely excised. It was submitted to pathology where frozen section confirmed parathyroid tissue consistent with adenoma.  Neck was irrigated with warm saline and good hemostasis was noted. Fibrillar was placed in the operative field. Strap muscles were reapproximated in the midline with interrupted 3-0 Vicryl sutures. Platysma was closed with interrupted 3-0 Vicryl sutures. Skin was closed with a running 4-0 Monocryl subcuticular suture. Marcaine was infiltrated circumferentially. Wound was washed and dried and benzoin and Steri-Strips were applied. Sterile gauze dressings were applied. Patient was awakened from anesthesia and brought to the recovery room. The patient tolerated the procedure well.   Earnstine Regal, MD, [facility]

## 2015-05-03 NOTE — Progress Notes (Signed)
Report given to Sydelle Sherfield rn as caregiver 

## 2015-05-03 NOTE — Anesthesia Preprocedure Evaluation (Signed)
Anesthesia Evaluation  Patient identified by MRN, date of birth, ID band Patient awake    Reviewed: Allergy & Precautions, NPO status , Patient's Chart, lab work & pertinent test results  History of Anesthesia Complications Negative for: history of anesthetic complications  Airway Mallampati: II  TM Distance: >3 FB Neck ROM: Full    Dental  (+) Teeth Intact,    Pulmonary neg pulmonary ROS,    breath sounds clear to auscultation       Cardiovascular hypertension, Pt. on medications  Rhythm:Regular     Neuro/Psych negative neurological ROS  negative psych ROS   GI/Hepatic negative GI ROS, Neg liver ROS,   Endo/Other    Renal/GU negative Renal ROS     Musculoskeletal  (+) Arthritis ,   Abdominal   Peds  Hematology negative hematology ROS (+)   Anesthesia Other Findings   Reproductive/Obstetrics                             Anesthesia Physical Anesthesia Plan  ASA: II  Anesthesia Plan: General   Post-op Pain Management:    Induction: Intravenous  Airway Management Planned: Oral ETT  Additional Equipment: None  Intra-op Plan:   Post-operative Plan: Extubation in OR  Informed Consent: I have reviewed the patients History and Physical, chart, labs and discussed the procedure including the risks, benefits and alternatives for the proposed anesthesia with the patient or authorized representative who has indicated his/her understanding and acceptance.   Dental advisory given  Plan Discussed with: CRNA and Surgeon  Anesthesia Plan Comments:         Anesthesia Quick Evaluation

## 2015-05-03 NOTE — Transfer of Care (Signed)
Immediate Anesthesia Transfer of Care Note  Patient: Katherine [name]  Procedure(s) Performed: Procedure(s): RIGHT SUPERIOR PARATHYROIDECTOMY (Right)  Patient Location: PACU  Anesthesia Type:General  Level of Consciousness: awake, alert , oriented and patient cooperative  Airway & Oxygen Therapy: Patient Spontanous Breathing and Patient connected to nasal cannula oxygen  Post-op Assessment: Report given to RN and Post -op Vital signs reviewed and stable  Post vital signs: Reviewed  Last Vitals:  BP 126/90 HR 103 RR 16 SpO2 100%  Resting comfortably, maintains good airway, denies pain.   Complications: No apparent anesthesia complications

## 2015-05-03 NOTE — Anesthesia Procedure Notes (Signed)
Procedure Name: Intubation Date/Time: 05/03/2015 10:59 AM Performed by: Willeen Cass P Pre-anesthesia Checklist: Patient identified, Timeout performed, Emergency Drugs available, Patient being monitored and Suction available Patient Re-evaluated:Patient Re-evaluated prior to inductionOxygen Delivery Method: Circle system utilized Preoxygenation: Pre-oxygenation with 100% oxygen Intubation Type: IV induction Ventilation: Mask ventilation without difficulty Laryngoscope Size: Mac and 3 Grade View: Grade I Tube type: Oral Tube size: 7.0 mm Number of attempts: 1 Airway Equipment and Method: Stylet and LTA kit utilized Placement Confirmation: ETT inserted through vocal cords under direct vision,  breath sounds checked- equal and bilateral and positive ETCO2 Secured at: 23 cm Tube secured with: Tape Dental Injury: Teeth and Oropharynx as per pre-operative assessment

## 2015-05-05 NOTE — Anesthesia Postprocedure Evaluation (Signed)
Anesthesia Post Note  Patient: Katherine [name]  Procedure(s) Performed: Procedure(s) (LRB): RIGHT SUPERIOR PARATHYROIDECTOMY (Right)  Patient location during evaluation: PACU Level of consciousness: awake Pain management: pain level controlled Vital Signs Assessment: post-procedure vital signs reviewed and stable Respiratory status: spontaneous breathing Cardiovascular status: stable Postop Assessment: no signs of nausea or vomiting Anesthetic complications: no    Last Vitals:  Filed Vitals:   05/03/15 1330 05/03/15 1345  BP:    Pulse: 100 96  Temp: 36.6 C   Resp: 17 22    Last Pain:  Filed Vitals:   05/03/15 1406  PainSc: 4                  Zarie Kosiba

## 2015-05-07 ENCOUNTER — Encounter (HOSPITAL_COMMUNITY): Payer: Self-pay | Admitting: Surgery

## 2015-05-12 DIAGNOSIS — M7541 Impingement syndrome of right shoulder: Secondary | ICD-10-CM

## 2015-05-12 HISTORY — DX: Impingement syndrome of right shoulder: M75.41

## 2015-05-28 ENCOUNTER — Ambulatory Visit (INDEPENDENT_AMBULATORY_CARE_PROVIDER_SITE_OTHER): Payer: Medicare Other | Admitting: *Deleted

## 2015-05-28 DIAGNOSIS — Z23 Encounter for immunization: Secondary | ICD-10-CM

## 2015-07-09 ENCOUNTER — Ambulatory Visit (INDEPENDENT_AMBULATORY_CARE_PROVIDER_SITE_OTHER): Payer: Medicare Other | Admitting: Nurse Practitioner

## 2015-07-09 ENCOUNTER — Other Ambulatory Visit: Payer: Self-pay | Admitting: *Deleted

## 2015-07-09 ENCOUNTER — Encounter: Payer: Self-pay | Admitting: Nurse Practitioner

## 2015-07-09 VITALS — BP 135/82 | HR 111 | Ht 65.0 in | Wt 246.8 lb

## 2015-07-09 DIAGNOSIS — E669 Obesity, unspecified: Secondary | ICD-10-CM

## 2015-07-09 DIAGNOSIS — G609 Hereditary and idiopathic neuropathy, unspecified: Secondary | ICD-10-CM | POA: Diagnosis not present

## 2015-07-09 DIAGNOSIS — G619 Inflammatory polyneuropathy, unspecified: Secondary | ICD-10-CM | POA: Diagnosis not present

## 2015-07-09 DIAGNOSIS — G622 Polyneuropathy due to other toxic agents: Secondary | ICD-10-CM | POA: Diagnosis not present

## 2015-07-09 MED ORDER — TRAMADOL HCL 50 MG PO TABS: ORAL_TABLET | ORAL | Status: DC

## 2015-07-09 NOTE — Patient Instructions (Signed)
Continue nortriptyline at current dose Continue Lyrica at current dose, will refill Continue lidocaine cream refill Continue Metanx twice daily Continue Ultram when necessary will refill Continue exercise program Follow-up in 6 months next with Dr. Krista Blue

## 2015-07-09 NOTE — Progress Notes (Signed)
I have reviewed and agreed above plan. 

## 2015-07-09 NOTE — Progress Notes (Signed)
[facility]  PATIENT: Katherine [name] DOB: 1950-06-18   REASON FOR VISIT: Follow-up for idiopathic peripheral neuropathy HISTORY FROM: Patient    HISTORY OF PRESENT ILLNESS:CM Ms. Katherine [name], 65 year old female returns for followup. She was last seen in this office 01/09/15.  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 She has recently experienced similar symptoms in her hands as well.. She is also on Lidocaine cream. She also takes occasional Ultram She continues to exercise 5 times a week and she does water aerobics. Her symptoms are well controlled on the current treatment regimen.She returns for reevaluation    HISTORY: She has been followed here since April 2010 for sensory neuropathy, with numbness and pain in the feet and consistent NCV findings. Exam demonstrated mild sensory loss in the feet, worse on the right, and diminished ankle jerks. Neuropathy labs were unremarkable, except for a slightly elevated HbA1c of 5.9. Advised to take a multivitamin. Gabapentin provided incomplete relief, and she had some difficulties with drowsiness. Nortriptyline was added at doses increasing to 50 mg q.h.s. She reports ongoing soreness of feet, not bad, but had 2 bouts of severe pain lasting 30-45 minutes, mostly in evenings; given tramadol.  12/29/12 Remains on Lyrica 100mg  BID and Nortriptyline 75mg  at night. . She continues to have occasional foot pain, She takes tramadol for pain. Transdermal cream was stopped due to insurance issues, she is back on that. She has stopped her Metanx insurance would not pay She is doing water aerobics 3 to 4 times weekly. She continues to retain fluid around the ankles.    REVIEW OF SYSTEMS: Full 14 system review of systems performed and notable only for those listed, all others  are neg:  Constitutional: neg  Cardiovascular: Leg swelling Ear/Nose/Throat: neg  Skin: neg Eyes: neg Respiratory: Cough Gastroitestinal: neg  Hematology/Lymphatic: neg  Endocrine: neg Musculoskeletal: Joint pain back pain Allergy/Immunology: neg Neurological: Numbness of the hands, tremors Psychiatric: neg Sleep : neg   ALLERGIES: Allergies  Allergen Reactions  . Erythromycin     REACTION: chest pain    HOME MEDICATIONS: Outpatient Prescriptions Prior to Visit  Medication Sig Dispense Refill  . b complex vitamins tablet Take 1 tablet by mouth daily.    . Cholecalciferol (VITAMIN D-3 PO) Take 2,000 Units by mouth.     . cyanocobalamin 1000 MCG tablet Take 1,000 mcg by mouth daily.     Marland Kitchen HYDROcodone-acetaminophen (NORCO/VICODIN) 5-325 MG tablet Take 1-2 tablets by mouth every 4 (four) hours as needed for moderate pain. 20 tablet 0  . ibuprofen (ADVIL,MOTRIN) 200 MG tablet Take 200 mg by mouth every 6 (six) hours as needed for moderate pain.     Marland Kitchen L-Methylfolate-Algae-B12-B6 (METANX) 3-90.314-2-35 MG CAPS Take one capsule by mouth two times daily 180 capsule 3  . lidocaine-prilocaine (EMLA) cream Apply 1-2 applications  daily to affected area 180 g 1  . lisinopril (PRINIVIL,ZESTRIL) 10 MG tablet Take 1 tablet (10 mg total) by mouth daily. 90 tablet 3  . Multiple Vitamin (MULTIVITAMIN) tablet Take 1 tablet by mouth daily.      . nortriptyline (PAMELOR) 25 MG capsule Take 3 capsules by mouth at bedtime 270 capsule 1  . Nutritional Supplements (ESTROVEN PO) Take 1 tablet by mouth daily.    . pregabalin (LYRICA) 100 MG capsule Take 1 capsule (100 mg total) by  mouth 2 (two) times daily. 180 capsule 1  . traMADol (ULTRAM) 50 MG tablet Take one to two tablets by mouth every 6 hours if needed for pain, Prescribed by Neurology 180 tablet 1   No facility-administered medications prior to visit.    PAST MEDICAL HISTORY: Past Medical History  Diagnosis Date  . Hyperlipidemia   .  Chronic kidney disease     kidney stones  . Hypertension   . Diverticulosis of colon   . Elevated LFTs     neg for hep b and c in 2006  . Kidney infection   . History of mumps   . Yeast infection   . Bacterial infection   . Trichomonas   . Pelvic pain in female 10/27/10  . Arthritis     osteo  . Neuropathy (HCC)     hands and feet  . Family history of adverse reaction to anesthesia     mother with sleep apnea  . Kidney stones     PAST SURGICAL HISTORY: Past Surgical History  Procedure Laterality Date  . Carpal tunnel release    . Abdominal hysterectomy    . Replacement total knee bilateral    . Rotator cuff repair    . Kidney stone surgery    . Trigger finger release  2010    right  . Knee surgery    . Vesicovaginal fistula closure w/ tah    . Dilation and curettage of uterus    . Hand surgery  06/2011    2 surgeries on left hand  . Wrist surgery  10/2011    right wrist  . Left elbow surgery    . Cystoscopy with retrograde pyelogram, ureteroscopy and stent placement Left 12/18/2014    Procedure: CYSTOSCOPY WITH RETROGRADE PYELOGRAM, URETEROSCOPY AND STENT PLACEMENT left ureter;  Surgeon: Raynelle Bring, MD;  Location: [facility];  Service: Urology;  Laterality: Left;  . Holmium laser application Left [id number]    Procedure: HOLMIUM LASER APPLICATION left ureter ;  Surgeon: Raynelle Bring, MD;  Location: [facility];  Service: Urology;  Laterality: Left;  . Tonsillectomy    . Parathyroidectomy Right 05/03/2015    Procedure: RIGHT SUPERIOR PARATHYROIDECTOMY;  Surgeon: Armandina Gemma, MD;  Location: [facility];  Service: General;  Laterality: Right;    FAMILY HISTORY: Family History  Problem Relation Age of Onset  . Stroke Mother   . Cancer Mother 95    bone cancer  . Colon cancer Neg Hx   . Esophageal cancer Neg Hx   . Rectal cancer Neg Hx   . Stomach cancer Neg Hx   . Diabetes Sister     SOCIAL HISTORY: Social History   Social History  . Marital Status: Single    Spouse Name:  N/A  . Number of Children: 1  . Years of Education: N/A   Occupational History  .     Social History Main Topics  . Smoking status: Never Smoker   . Smokeless tobacco: Never Used  . Alcohol Use: No  . Drug Use: No  . Sexual Activity: No   Other Topics Concern  . Not on file   Social History Narrative   Patient is single, has 1 child   Patient is right handed   Caffeine consumption is 0     PHYSICAL EXAM  Filed Vitals:   07/09/15 0806  BP: 135/82  Pulse: 111  Height: 5\' 5"  (1.651 m)  Weight: 246 lb 12.8 oz (111.948 kg)   Body mass  index is 41.07 kg/(m^2). Generalized: Well developed, obese female in no acute distress  Head: normocephalic and atraumatic,. Oropharynx benign  Neck: Supple, no carotid bruits  Cardiac: Regular rate rhythm, no murmur  Musculoskeletal: No deformity   Neurological examination   Mentation: Alert oriented to time, place, history taking. Attention span and concentration appropriate. Recent and remote memory intact. Follows all commands speech and language fluent.   Cranial nerve II-XII:  Pupils were equal round reactive to light extraocular movements were full, visual field were full on confrontational test. Facial sensation and strength were normal. hearing was intact to finger rubbing bilaterally. Uvula tongue midline. head turning and shoulder shrug were normal and symmetric.Tongue protrusion into cheek strength was normal. Motor: normal bulk and tone, full strength in the BUE, BLE, fine finger movements normal, no pronator drift. No focal weakness Sensory: normal and symmetric to light touch, Vibration, proprioception , decreased pinprick in a stocking fashion to mid shin. Coordination: finger-nose-finger, heel-to-shin bilaterally, no dysmetria Reflexes: 1+ upper and lower and symmetric except absent ankle jerks plantar responses were flexor bilaterally. Gait and Station: Rising up from seated position without assistance, normal stance,  moderate stride, good arm swing, smooth turning, able to perform tiptoe, and heel walking without difficulty. Tandem gait is mildly unsteady, no assistive device   DIAGNOSTIC DATA (LABS, IMAGING, TESTING) - I reviewed patient records, labs, notes, testing and imaging myself where available.  Lab Results  Component Value Date   WBC 4.3 05/01/2015   HGB 13.3 05/01/2015   HCT 38.7 05/01/2015   MCV 89.0 05/01/2015   PLT 157 05/01/2015      Component Value Date/Time   NA 141 05/01/2015 1415   K 4.1 05/01/2015 1415   CL 107 05/01/2015 1415   CO2 27 05/01/2015 1415   GLUCOSE 129* 05/01/2015 1415   BUN 8 05/01/2015 1415   CREATININE 0.80 05/01/2015 1415   CALCIUM 10.9* 05/01/2015 1415   CALCIUM 9.8 06/17/2009 2212   PROT 6.4* 05/01/2015 1415   ALBUMIN 3.7 05/01/2015 1415   AST 57* 05/01/2015 1415   ALT 26 05/01/2015 1415   ALKPHOS 80 05/01/2015 1415   BILITOT 0.7 05/01/2015 1415   GFRNONAA >60 05/01/2015 1415   GFRAA >60 05/01/2015 1415   Lab Results  Component Value Date   CHOL 201* 05/22/2014   HDL 40.50 05/22/2014   LDLCALC 130* 05/22/2014   LDLDIRECT 139.1 02/11/2011   TRIG 155.0* 05/22/2014   CHOLHDL 5 05/22/2014    Lab Results  Component Value Date   TSH 1.15 05/22/2014      ASSESSMENT AND PLAN 65 y.o. year old female has a past medical history of peripheral neuropathy which is now extended into the hands. She is currently on Lyrica and nortriptyline Metanx and Lidoderm cream. She is continuing to exercise 5 times a week. Her symptoms are well controlled on this regimen  Continue nortriptyline at current dose does not need refills Continue Lyrica at current dose, does not need refills Continue lidocaine cream  Continue Metanx twice daily was just refilled  Continue Ultram when necessary will refill Continue exercise program Follow-up in 6 months next with Dr. Luan Pulling, [facility], [facility], [facility] [address] [place], [place] [address] [phone]

## 2015-07-09 NOTE — Telephone Encounter (Signed)
Ultram prescription faxed to [facility] [phone]

## 2015-07-31 ENCOUNTER — Other Ambulatory Visit: Payer: Self-pay | Admitting: Internal Medicine

## 2015-08-02 ENCOUNTER — Other Ambulatory Visit: Payer: Self-pay | Admitting: Neurology

## 2015-08-05 ENCOUNTER — Other Ambulatory Visit: Payer: Self-pay | Admitting: *Deleted

## 2015-08-07 ENCOUNTER — Encounter: Payer: Self-pay | Admitting: *Deleted

## 2015-08-07 ENCOUNTER — Telehealth: Payer: Self-pay | Admitting: Family Medicine

## 2015-08-07 MED ORDER — PREGABALIN 100 MG PO CAPS: 100.0000 mg | ORAL_CAPSULE | Freq: Two times a day (BID) | ORAL | Status: DC

## 2015-08-07 NOTE — Telephone Encounter (Signed)
I have reordered Lyrica 100 mg twice a day, 180 tablets with one refill, please let patient know

## 2015-08-07 NOTE — Addendum Note (Signed)
Addended by: Marcial Pacas on: 08/07/2015 10:46 AM   Modules accepted: Orders

## 2015-08-07 NOTE — Telephone Encounter (Signed)
Patient notified

## 2015-09-02 ENCOUNTER — Other Ambulatory Visit (INDEPENDENT_AMBULATORY_CARE_PROVIDER_SITE_OTHER): Payer: Medicare Other

## 2015-09-02 DIAGNOSIS — Z0000 Encounter for general adult medical examination without abnormal findings: Secondary | ICD-10-CM

## 2015-09-02 LAB — TSH: TSH: 1.05 u[IU]/mL (ref 0.35–4.50)

## 2015-09-02 LAB — BASIC METABOLIC PANEL
BUN: 11 mg/dL (ref 6–23)
CHLORIDE: 104 meq/L (ref 96–112)
CO2: 28 meq/L (ref 19–32)
CREATININE: 0.75 mg/dL (ref 0.40–1.20)
Calcium: 9.7 mg/dL (ref 8.4–10.5)
GFR: 99.31 mL/min (ref >60.00)
Glucose, Bld: 121 mg/dL — ABNORMAL HIGH (ref 70–99)
Potassium: 3.8 mEq/L (ref 3.5–5.1)
Sodium: 139 mEq/L (ref 135–145)

## 2015-09-02 LAB — CBC WITH DIFFERENTIAL/PLATELET
BASOS PCT: 0.7 % (ref 0.0–3.0)
Basophils Absolute: 0 10*3/uL (ref 0.0–0.1)
EOS ABS: 0.1 10*3/uL (ref 0.0–0.7)
Eosinophils Relative: 2.5 % (ref 0.0–5.0)
HCT: 38.6 % (ref 36.0–46.0)
Hemoglobin: 13.1 g/dL (ref 12.0–15.0)
Lymphocytes Relative: 46.2 % — ABNORMAL HIGH (ref 12.0–46.0)
Lymphs Abs: 1.8 10*3/uL (ref 0.7–4.0)
MCHC: 34 g/dL (ref 30.0–36.0)
MCV: 86.6 fl (ref 78.0–100.0)
MONO ABS: 0.4 10*3/uL (ref 0.1–1.0)
Monocytes Relative: 10.7 % (ref 3.0–12.0)
NEUTROS ABS: 1.6 10*3/uL (ref 1.4–7.7)
Neutrophils Relative %: 39.9 % — ABNORMAL LOW (ref 43.0–77.0)
PLATELETS: 184 10*3/uL (ref 150.0–400.0)
RBC: 4.46 Mil/uL (ref 3.87–5.11)
RDW: 15.2 % (ref 11.5–15.5)
WBC: 3.9 10*3/uL — ABNORMAL LOW (ref 4.0–10.5)

## 2015-09-02 LAB — LIPID PANEL
Cholesterol: 226 mg/dL — ABNORMAL HIGH (ref 0–200)
HDL: 44.2 mg/dL (ref >39.00)
LDL Cholesterol: 155 mg/dL — ABNORMAL HIGH (ref 0–99)
NONHDL: 182.02
TRIGLYCERIDES: 137 mg/dL (ref 0.0–149.0)
Total CHOL/HDL Ratio: 5
VLDL: 27.4 mg/dL (ref 0.0–40.0)

## 2015-09-02 LAB — POC URINALSYSI DIPSTICK (AUTOMATED)
Bilirubin, UA: NEGATIVE
GLUCOSE UA: NEGATIVE
Ketones, UA: NEGATIVE
Leukocytes, UA: NEGATIVE
NITRITE UA: NEGATIVE
PH UA: 8
PROTEIN UA: NEGATIVE
RBC UA: NEGATIVE
Spec Grav, UA: 1.02
UROBILINOGEN UA: 1

## 2015-09-02 LAB — HEPATIC FUNCTION PANEL
ALT: 23 U/L (ref 0–35)
AST: 44 U/L — ABNORMAL HIGH (ref 0–37)
Albumin: 4 g/dL (ref 3.5–5.2)
Alkaline Phosphatase: 71 U/L (ref 39–117)
BILIRUBIN DIRECT: 0.1 mg/dL (ref 0.0–0.3)
BILIRUBIN TOTAL: 0.5 mg/dL (ref 0.2–1.2)
Total Protein: 7.1 g/dL (ref 6.0–8.3)

## 2015-09-09 ENCOUNTER — Ambulatory Visit (INDEPENDENT_AMBULATORY_CARE_PROVIDER_SITE_OTHER): Payer: Medicare Other | Admitting: Internal Medicine

## 2015-09-09 ENCOUNTER — Encounter: Payer: Self-pay | Admitting: Internal Medicine

## 2015-09-09 VITALS — BP 120/82 | HR 106 | Temp 98.2°F | Resp 20 | Ht 64.5 in | Wt 248.0 lb

## 2015-09-09 DIAGNOSIS — I10 Essential (primary) hypertension: Secondary | ICD-10-CM

## 2015-09-09 DIAGNOSIS — Z0000 Encounter for general adult medical examination without abnormal findings: Secondary | ICD-10-CM | POA: Diagnosis not present

## 2015-09-09 DIAGNOSIS — E785 Hyperlipidemia, unspecified: Secondary | ICD-10-CM

## 2015-09-09 DIAGNOSIS — R7302 Impaired glucose tolerance (oral): Secondary | ICD-10-CM

## 2015-09-09 DIAGNOSIS — E213 Hyperparathyroidism, unspecified: Secondary | ICD-10-CM

## 2015-09-09 NOTE — Progress Notes (Signed)
Subjective:    Patient ID: Katherine [name], female    DOB: [birth year], 65 y.o.   MRN: [id number]  HPI   61 -year-old patient who is seen today for a preventive health examination.  Medical problems include peripheral neuropathy.  She has been seen by neurology and has been treated aggressively for symptomatic pain and numbness involving her hands and feet She has a history of hypertension which has been well controlled Additionally, she has a history of primary hyperparathyroidism.  Hypertension has been treated with thiazide diuretics.  She did have a bone density study approximately 3 and a half years ago which was normal.  In 2006, she underwent a hyper parathyroidectomy and also treatment for a ureteral stone. She is followed annually by gynecology.  Colonoscopy 2013, which was unremarkable.  10 year interval recommended  Family history. father's health unknown Mother died age 31.  Metastatic bone disease 3 brothers, one deceased from complications of hepatitis C 5 sisters Positive for hypertension.  2 sisters with a peripheral neuropathy, hypothyroidism and asthma  Social history single, 1 daughter age 58 3 grandchildren  Past Medical History  Diagnosis Date  . Hyperlipidemia   . Chronic kidney disease     kidney stones  . Hypertension   . Diverticulosis of colon   . Elevated LFTs     neg for hep b and c in 2006  . Kidney infection   . History of mumps   . Yeast infection   . Bacterial infection   . Trichomonas   . Pelvic pain in female 10/27/10  . Arthritis     osteo  . Neuropathy (HCC)     hands and feet  . Family history of adverse reaction to anesthesia     mother with sleep apnea  . Kidney stones     Social History   Social History  . Marital Status: Single    Spouse Name: N/A  . Number of Children: 1  . Years of Education: N/A   Occupational History  .     Social History Main Topics  . Smoking status: Never Smoker   . Smokeless tobacco: Never  Used  . Alcohol Use: No  . Drug Use: No  . Sexual Activity: No   Other Topics Concern  . Not on file   Social History Narrative   Patient is single, has 1 child   Patient is right handed   Caffeine consumption is 0    Past Surgical History  Procedure Laterality Date  . Carpal tunnel release    . Abdominal hysterectomy    . Replacement total knee bilateral    . Rotator cuff repair    . Kidney stone surgery    . Trigger finger release  2010    right  . Knee surgery    . Vesicovaginal fistula closure w/ tah    . Dilation and curettage of uterus    . Hand surgery  06/2011    2 surgeries on left hand  . Wrist surgery  10/2011    right wrist  . Left elbow surgery    . Cystoscopy with retrograde pyelogram, ureteroscopy and stent placement Left 12/18/2014    Procedure: CYSTOSCOPY WITH RETROGRADE PYELOGRAM, URETEROSCOPY AND STENT PLACEMENT left ureter;  Surgeon: Raynelle Bring, MD;  Location: [facility];  Service: Urology;  Laterality: Left;  . Holmium laser application Left [id number]    Procedure: HOLMIUM LASER APPLICATION left ureter ;  Surgeon: Raynelle Bring, MD;  Location: [facility];  Service: Urology;  Laterality: Left;  . Tonsillectomy    . Parathyroidectomy Right 05/03/2015    Procedure: RIGHT SUPERIOR PARATHYROIDECTOMY;  Surgeon: Armandina Gemma, MD;  Location: [facility];  Service: General;  Laterality: Right;    Family History  Problem Relation Age of Onset  . Stroke Mother   . Cancer Mother 64    bone cancer  . Colon cancer Neg Hx   . Esophageal cancer Neg Hx   . Rectal cancer Neg Hx   . Stomach cancer Neg Hx   . Diabetes Sister     Allergies  Allergen Reactions  . Erythromycin     REACTION: chest pain    Current Outpatient Prescriptions on File Prior to Visit  Medication Sig Dispense Refill  . b complex vitamins tablet Take 1 tablet by mouth daily.    . Cholecalciferol (VITAMIN D-3 PO) Take 2,000 Units by mouth.     . cyanocobalamin 1000 MCG tablet Take 1,000 mcg by mouth  daily.     Marland Kitchen HYDROcodone-acetaminophen (NORCO/VICODIN) 5-325 MG tablet Take 1-2 tablets by mouth every 4 (four) hours as needed for moderate pain. 20 tablet 0  . ibuprofen (ADVIL,MOTRIN) 200 MG tablet Take 200 mg by mouth every 6 (six) hours as needed for moderate pain.     Marland Kitchen L-Methylfolate-Algae-B12-B6 (METANX) 3-90.314-2-35 MG CAPS Take one capsule by mouth two times daily 180 capsule 3  . lidocaine-prilocaine (EMLA) cream Apply 1-2 applications  daily to affected area 180 g 3  . lisinopril (PRINIVIL,ZESTRIL) 10 MG tablet TAKE ONE TABLET BY MOUTH ONCE DAILY 90 tablet 1  . Multiple Vitamin (MULTIVITAMIN) tablet Take 1 tablet by mouth daily.      . nortriptyline (PAMELOR) 25 MG capsule Take 3 capsules by mouth at bedtime 270 capsule 3  . Nutritional Supplements (ESTROVEN PO) Take 1 tablet by mouth daily.    . pregabalin (LYRICA) 100 MG capsule Take 1 capsule (100 mg total) by mouth 2 (two) times daily. 180 capsule 1  . RESTASIS 0.05 % ophthalmic emulsion Place 1 drop into both eyes. Reported on 07/09/2015    . traMADol (ULTRAM) 50 MG tablet Take one to two tablets by mouth every 6 hours if needed for pain, Prescribed by Neurology 180 tablet 1  . [DISCONTINUED] gabapentin (NEURONTIN) 300 MG capsule Take 300 mg by mouth 3 (three) times daily.       No current facility-administered medications on file prior to visit.    There were no vitals taken for this visit.  1. Risk factors, based on past  M,S,F history.  Cardiovascular risk factors include hypertension and dyslipidemia  2.  Physical activities: Somewhat limited due to arthritis.  She has had bilateral total knee replacement surgeries.  She has a significant peripheral neuropathy  3.  Depression/mood: No history of major depression or mood disorder  4.  Hearing: No deficits  5.  ADL's: Independent  6.  Fall risk: Moderate due to peripheral neuropathy and obesity. 7.  Home safety: No problems identified  8.  Height weight, and visual  acuity; no change in weight or visual acuity  9.  Counseling: Heart healthy diet recommended more regular exercise.  Modest weight loss.  All encouraged  10. Lab orders based on risk factors: Laboratory profile reviewed  11. Referral : Follow-up endocrine and GYN in neurology  12. Care plan: Serial calcium levels will be reviewed.  Hydrochlorothiazide discontinued  13. Cognitive assessment: Alert and oriented with normal affect no cognitive dysfunction  14. Screening: We'll  continue to have annual health examinations with screening lab was special attention to serum calcium .  Patient was provided with a written and personalized care plan  15. Provider List Update: , includes primary care neurology OB/GYN in endocrinology     Review of Systems  Constitutional: Negative for fever, appetite change, fatigue and unexpected weight change.  HENT: Negative for congestion, dental problem, ear pain, hearing loss, mouth sores, nosebleeds, sinus pressure, sore throat, tinnitus, trouble swallowing and voice change.   Eyes: Negative for photophobia, pain, redness and visual disturbance.  Respiratory: Negative for cough, chest tightness and shortness of breath.   Cardiovascular: Negative for chest pain, palpitations and leg swelling.  Gastrointestinal: Negative for nausea, vomiting, abdominal pain, diarrhea, constipation, blood in stool, abdominal distention and rectal pain.  Genitourinary: Negative for dysuria, urgency, frequency, hematuria, flank pain, vaginal bleeding, vaginal discharge, difficulty urinating, genital sores, vaginal pain, menstrual problem and pelvic pain.  Musculoskeletal: Negative for back pain, arthralgias and neck stiffness.  Skin: Negative for rash.  Neurological: Negative for dizziness, syncope, speech difficulty, weakness, light-headedness, numbness and headaches.  Hematological: Negative for adenopathy. Does not bruise/bleed easily.  Psychiatric/Behavioral: Negative for  suicidal ideas, behavioral problems, self-injury, dysphoric mood and agitation. The patient is not nervous/anxious.        Objective:   Physical Exam  Constitutional: She is oriented to person, place, and time. She appears well-developed and well-nourished.  Obese Blood pressure 100/70  HENT:  Head: Normocephalic and atraumatic.  Right Ear: External ear normal.  Left Ear: External ear normal.  Mouth/Throat: Oropharynx is clear and moist.  Eyes: Conjunctivae and EOM are normal.  Neck: Normal range of motion. Neck supple. No JVD present. No thyromegaly present.  Cardiovascular: Normal rate, regular rhythm, normal heart sounds and intact distal pulses.   No murmur heard. Dorsalis pedis pulses full.  Posterior tibial pulses faint  Pulmonary/Chest: Effort normal and breath sounds normal. She has no wheezes. She has no rales.  Abdominal: Soft. Bowel sounds are normal. She exhibits no distension and no mass. There is no tenderness. There is no rebound and no guarding.  Musculoskeletal: Normal range of motion. She exhibits edema. She exhibits no tenderness.  Neurological: She is alert and oriented to person, place, and time. She has normal reflexes. No cranial nerve deficit. She exhibits normal muscle tone. Coordination normal.  Decreased vibratory sensation distally  Skin: Skin is warm and dry. No rash noted.  Psychiatric: She has a normal mood and affect. Her behavior is normal.          Assessment & Plan:  Preventive health care Hypertension.Stable.  Home blood pressure monitoring.  Encouraged Primary hyperparathyroidism. Status post parathyroidectomy December 2016 Menopausal syndrome Peripheral neuropathy.  Follow-up neurology  Recheck 6 months

## 2015-09-09 NOTE — Patient Instructions (Signed)
Limit your sodium (Salt) intake    It is important that you exercise regularly, at least 20 minutes 3 to 4 times per week.  If you develop chest pain or shortness of breath seek  medical attention.  You need to lose weight.  Consider a lower calorie diet and regular exercise.  Take a calcium supplement, plus [phone] units of vitamin D  Return in 6 months for follow-up   Menopause is a normal process in which your reproductive ability comes to an end. This process happens gradually over a span of months to years, usually between the ages of 63 and 7. Menopause is complete when you have missed 12 consecutive menstrual periods. It is important to talk with your health care provider about some of the most common conditions that affect postmenopausal women, such as heart disease, cancer, and bone loss (osteoporosis). Adopting a healthy lifestyle and getting preventive care can help to promote your health and wellness. Those actions can also lower your chances of developing some of these common conditions. WHAT SHOULD I KNOW ABOUT MENOPAUSE? During menopause, you may experience a number of symptoms, such as:  Moderate-to-severe hot flashes.  Night sweats.  Decrease in sex drive.  Mood swings.  Headaches.  Tiredness.  Irritability.  Memory problems.  Insomnia. Choosing to treat or not to treat menopausal changes is an individual decision that you make with your health care provider. WHAT SHOULD I KNOW ABOUT HORMONE REPLACEMENT THERAPY AND SUPPLEMENTS? Hormone therapy products are effective for treating symptoms that are associated with menopause, such as hot flashes and night sweats. Hormone replacement carries certain risks, especially as you become older. If you are thinking about using estrogen or estrogen with progestin treatments, discuss the benefits and risks with your health care provider. WHAT SHOULD I KNOW ABOUT HEART DISEASE AND STROKE? Heart disease, heart attack, and  stroke become more likely as you age. This may be due, in part, to the hormonal changes that your body experiences during menopause. These can affect how your body processes dietary fats, triglycerides, and cholesterol. Heart attack and stroke are both medical emergencies. There are many things that you can do to help prevent heart disease and stroke:  Have your blood pressure checked at least every 1-2 years. High blood pressure causes heart disease and increases the risk of stroke.  If you are 1-81 years old, ask your health care provider if you should take aspirin to prevent a heart attack or a stroke.  Do not use any tobacco products, including cigarettes, chewing tobacco, or electronic cigarettes. If you need help quitting, ask your health care provider.  It is important to eat a healthy diet and maintain a healthy weight.  Be sure to include plenty of vegetables, fruits, low-fat dairy products, and lean protein.  Avoid eating foods that are high in solid fats, added sugars, or salt (sodium).  Get regular exercise. This is one of the most important things that you can do for your health.  Try to exercise for at least 150 minutes each week. The type of exercise that you do should increase your heart rate and make you sweat. This is known as moderate-intensity exercise.  Try to do strengthening exercises at least twice each week. Do these in addition to the moderate-intensity exercise.  Know your numbers.Ask your health care provider to check your cholesterol and your blood glucose. Continue to have your blood tested as directed by your health care provider. WHAT SHOULD I KNOW ABOUT CANCER SCREENING?  There are several types of cancer. Take the following steps to reduce your risk and to catch any cancer development as early as possible. Breast Cancer  Practice breast self-awareness.  This means understanding how your breasts normally appear and feel.  It also means doing regular  breast self-exams. Let your health care provider know about any changes, no matter how small.  If you are 64 or older, have a clinician do a breast exam (clinical breast exam or CBE) every year. Depending on your age, family history, and medical history, it may be recommended that you also have a yearly breast X-ray (mammogram).  If you have a family history of breast cancer, talk with your health care provider about genetic screening.  If you are at high risk for breast cancer, talk with your health care provider about having an MRI and a mammogram every year.  Breast cancer (BRCA) gene test is recommended for women who have family members with BRCA-related cancers. Results of the assessment will determine the need for genetic counseling and BRCA1 and for BRCA2 testing. BRCA-related cancers include these types:  Breast. This occurs in males or females.  Ovarian.  Tubal. This may also be called fallopian tube cancer.  Cancer of the abdominal or pelvic lining (peritoneal cancer).  Prostate.  Pancreatic. Cervical, Uterine, and Ovarian Cancer Your health care provider may recommend that you be screened regularly for cancer of the pelvic organs. These include your ovaries, uterus, and vagina. This screening involves a pelvic exam, which includes checking for microscopic changes to the surface of your cervix (Pap test).  For women ages 21-65, health care providers may recommend a pelvic exam and a Pap test every three years. For women ages 58-65, they may recommend the Pap test and pelvic exam, combined with testing for human papilloma virus (HPV), every five years. Some types of HPV increase your risk of cervical cancer. Testing for HPV may also be done on women of any age who have unclear Pap test results.  Other health care providers may not recommend any screening for nonpregnant women who are considered low risk for pelvic cancer and have no symptoms. Ask your health care provider if a  screening pelvic exam is right for you.  If you have had past treatment for cervical cancer or a condition that could lead to cancer, you need Pap tests and screening for cancer for at least 20 years after your treatment. If Pap tests have been discontinued for you, your risk factors (such as having a new sexual partner) need to be reassessed to determine if you should start having screenings again. Some women have medical problems that increase the chance of getting cervical cancer. In these cases, your health care provider may recommend that you have screening and Pap tests more often.  If you have a family history of uterine cancer or ovarian cancer, talk with your health care provider about genetic screening.  If you have vaginal bleeding after reaching menopause, tell your health care provider.  There are currently no reliable tests available to screen for ovarian cancer. Lung Cancer Lung cancer screening is recommended for adults 76-57 years old who are at high risk for lung cancer because of a history of smoking. A yearly low-dose CT scan of the lungs is recommended if you:  Currently smoke.  Have a history of at least 30 pack-years of smoking and you currently smoke or have quit within the past 15 years. A pack-year is smoking an average of  one pack of cigarettes per day for one year. Yearly screening should:  Continue until it has been 15 years since you quit.  Stop if you develop a health problem that would prevent you from having lung cancer treatment. Colorectal Cancer  This type of cancer can be detected and can often be prevented.  Routine colorectal cancer screening usually begins at age 21 and continues through age 59.  If you have risk factors for colon cancer, your health care provider may recommend that you be screened at an earlier age.  If you have a family history of colorectal cancer, talk with your health care provider about genetic screening.  Your health care  provider may also recommend using home test kits to check for hidden blood in your stool.  A small camera at the end of a tube can be used to examine your colon directly (sigmoidoscopy or colonoscopy). This is done to check for the earliest forms of colorectal cancer.  Direct examination of the colon should be repeated every 5-10 years until age 68. However, if early forms of precancerous polyps or small growths are found or if you have a family history or genetic risk for colorectal cancer, you may need to be screened more often. Skin Cancer  Check your skin from head to toe regularly.  Monitor any moles. Be sure to tell your health care provider:  About any new moles or changes in moles, especially if there is a change in a mole's shape or color.  If you have a mole that is larger than the size of a pencil eraser.  If any of your family members has a history of skin cancer, especially at a young age, talk with your health care provider about genetic screening.  Always use sunscreen. Apply sunscreen liberally and repeatedly throughout the day.  Whenever you are outside, protect yourself by wearing long sleeves, pants, a wide-brimmed hat, and sunglasses. WHAT SHOULD I KNOW ABOUT OSTEOPOROSIS? Osteoporosis is a condition in which bone destruction happens more quickly than new bone creation. After menopause, you may be at an increased risk for osteoporosis. To help prevent osteoporosis or the bone fractures that can happen because of osteoporosis, the following is recommended:  If you are 46-60 years old, get at least 1,000 mg of calcium and at least 600 mg of vitamin D per day.  If you are older than age 73 but younger than age 12, get at least 1,200 mg of calcium and at least 600 mg of vitamin D per day.  If you are older than age 53, get at least 1,200 mg of calcium and at least 800 mg of vitamin D per day. Smoking and excessive alcohol intake increase the risk of osteoporosis. Eat foods  that are rich in calcium and vitamin D, and do weight-bearing exercises several times each week as directed by your health care provider. WHAT SHOULD I KNOW ABOUT HOW MENOPAUSE AFFECTS [place]? Depression may occur at any age, but it is more common as you become older. Common symptoms of depression include:  Low or sad mood.  Changes in sleep patterns.  Changes in appetite or eating patterns.  Feeling an overall lack of motivation or enjoyment of activities that you previously enjoyed.  Frequent crying spells. Talk with your health care provider if you think that you are experiencing depression. WHAT SHOULD I KNOW ABOUT IMMUNIZATIONS? It is important that you get and maintain your immunizations. These include:  Tetanus, diphtheria, and pertussis (Tdap)  booster vaccine.  Influenza every year before the flu season begins.  Pneumonia vaccine.  Shingles vaccine. Your health care provider may also recommend other immunizations.   This information is not intended to replace advice given to you by your health care provider. Make sure you discuss any questions you have with your health care provider.   Document Released: 06/19/2005 Document Revised: 05/18/2014 Document Reviewed: 12/28/2013 Elsevier Interactive Patient Education [employer].

## 2015-09-09 NOTE — Progress Notes (Signed)
Pre visit review using our clinic review tool, if applicable. No additional management support is needed unless otherwise documented below in the visit note. 

## 2015-10-18 ENCOUNTER — Encounter: Payer: Self-pay | Admitting: Internal Medicine

## 2015-10-18 LAB — HM MAMMOGRAPHY

## 2015-10-27 ENCOUNTER — Encounter (HOSPITAL_COMMUNITY): Payer: Self-pay | Admitting: Emergency Medicine

## 2015-10-27 ENCOUNTER — Ambulatory Visit (HOSPITAL_COMMUNITY)
Admission: EM | Admit: 2015-10-27 | Discharge: 2015-10-27 | Disposition: A | Discharge status: Home / Self Care | Payer: Medicare Other | Attending: Family Medicine | Admitting: Family Medicine

## 2015-10-27 DIAGNOSIS — M25511 Pain in right shoulder: Secondary | ICD-10-CM

## 2015-10-27 MED ORDER — OXYCODONE-ACETAMINOPHEN 5-325 MG PO TABS: 1.0000 | ORAL_TABLET | ORAL | Status: DC | PRN

## 2015-10-27 MED ORDER — HYDROMORPHONE HCL 1 MG/ML IJ SOLN
INTRAMUSCULAR | Status: AC
  Filled 2015-10-27: qty 2

## 2015-10-27 MED ORDER — HYDROMORPHONE HCL 1 MG/ML IJ SOLN
2.0000 mg | Freq: Once | INTRAMUSCULAR | Status: AC
Start: 2015-10-27 — End: 2015-10-27
  Administered 2015-10-27: 2 mg via INTRAMUSCULAR

## 2015-10-27 NOTE — ED Notes (Signed)
PT reports right shoulder pain that started Friday. PT has had problems with this shoulder off and on for years. PT had rotator cuff surgery in the 90s. PT reports pain starts in right side of neck and radiates down right shoulder. PT has a knot over right shoulder as well.

## 2015-10-27 NOTE — ED Provider Notes (Signed)
CSN: [id number]     Arrival date & time 10/27/15  1322 History   First MD Initiated Contact with Patient 10/27/15 1430     Chief Complaint  Patient presents with  . Shoulder Pain   (Consider location/radiation/quality/duration/timing/severity/associated sxs/prior Treatment) Patient is a 65 y.o. female presenting with shoulder pain. The history is provided by the patient.  Shoulder Pain Location:  Shoulder Time since incident:  1 week Injury: no   Shoulder location:  R shoulder Pain details:    Quality:  Aching, sharp, throbbing and shooting   Radiates to:  R upper arm   Severity:  Severe   Onset quality:  Gradual   Duration:  1 week   Timing:  Constant   Progression:  Worsening Chronicity:  Chronic Handedness:  Right-handed Dislocation: no   Foreign body present:  No foreign bodies Relieved by:  Narcotics Worsened by:  Movement Associated symptoms: decreased range of motion, fever, neck pain and stiffness   Associated symptoms: no back pain, no fatigue, no muscle weakness, no numbness, no swelling and no tingling   (R) shoulder pain x 1 week. Severe to point unable to move w/o significant pain. S/p rotator cuff surgery in the 90's and w/ long standing (R) shoulder pain since. Steroid injections have never helped. Needs to return to Dr Ronnie Derby for reeval but pain meds at home [facility]) not providing any relief. Pt is tearful w/ any ROM.    Past Medical History  Diagnosis Date  . Hyperlipidemia   . Chronic kidney disease     kidney stones  . Hypertension   . Diverticulosis of colon   . Elevated LFTs     neg for hep b and c in 2006  . Kidney infection   . History of mumps   . Yeast infection   . Bacterial infection   . Trichomonas   . Pelvic pain in female 10/27/10  . Arthritis     osteo  . Neuropathy (HCC)     hands and feet  . Family history of adverse reaction to anesthesia     mother with sleep apnea  . Kidney stones    Past Surgical History  Procedure Laterality  Date  . Carpal tunnel release    . Abdominal hysterectomy    . Replacement total knee bilateral    . Rotator cuff repair    . Kidney stone surgery    . Trigger finger release  2010    right  . Knee surgery    . Vesicovaginal fistula closure w/ tah    . Dilation and curettage of uterus    . Hand surgery  06/2011    2 surgeries on left hand  . Wrist surgery  10/2011    right wrist  . Left elbow surgery    . Cystoscopy with retrograde pyelogram, ureteroscopy and stent placement Left 12/18/2014    Procedure: CYSTOSCOPY WITH RETROGRADE PYELOGRAM, URETEROSCOPY AND STENT PLACEMENT left ureter;  Surgeon: Raynelle Bring, MD;  Location: [facility];  Service: Urology;  Laterality: Left;  . Holmium laser application Left [id number]    Procedure: HOLMIUM LASER APPLICATION left ureter ;  Surgeon: Raynelle Bring, MD;  Location: [facility];  Service: Urology;  Laterality: Left;  . Tonsillectomy    . Parathyroidectomy Right 05/03/2015    Procedure: RIGHT SUPERIOR PARATHYROIDECTOMY;  Surgeon: Armandina Gemma, MD;  Location: [place];  Service: General;  Laterality: Right;   Family History  Problem Relation Age of Onset  . Stroke Mother   .  Cancer Mother 53    bone cancer  . Colon cancer Neg Hx   . Esophageal cancer Neg Hx   . Rectal cancer Neg Hx   . Stomach cancer Neg Hx   . Diabetes Sister    Social History  Substance Use Topics  . Smoking status: Never Smoker   . Smokeless tobacco: Never Used  . Alcohol Use: No   OB History    Gravida Para Term Preterm AB TAB SAB Ectopic Multiple Living   1 1             Review of Systems  Constitutional: Positive for fever. Negative for fatigue.  Musculoskeletal: Positive for stiffness and neck pain. Negative for back pain.  All other systems reviewed and are negative.   Allergies  Erythromycin  Home Medications   Prior to Admission medications   Medication Sig Start Date End Date Taking? Authorizing Provider  b complex vitamins tablet Take 1 tablet by mouth  daily.    Historical Provider, MD  Cholecalciferol (VITAMIN D-3 PO) Take 2,000 Units by mouth.     Historical Provider, MD  cyanocobalamin 1000 MCG tablet Take 1,000 mcg by mouth daily.     Historical Provider, MD  HYDROcodone-acetaminophen (NORCO/VICODIN) 5-325 MG tablet Take 1-2 tablets by mouth every 4 (four) hours as needed for moderate pain. 05/03/15   Armandina Gemma, MD  ibuprofen (ADVIL,MOTRIN) 200 MG tablet Take 200 mg by mouth every 6 (six) hours as needed for moderate pain.     Historical Provider, MD  L-Methylfolate-Algae-B12-B6 Glade Stanford) [phone] MG CAPS Take one capsule by mouth two times daily 04/16/15   Marcial Pacas, MD  lidocaine-prilocaine (EMLA) cream Apply 1-2 applications  daily to affected area 08/05/15   Marcial Pacas, MD  lisinopril (PRINIVIL,ZESTRIL) 10 MG tablet TAKE ONE TABLET BY MOUTH ONCE DAILY 07/31/15   Marletta Lor, MD  Multiple Vitamin (MULTIVITAMIN) tablet Take 1 tablet by mouth daily.      Historical Provider, MD  nortriptyline (PAMELOR) 25 MG capsule Take 3 capsules by mouth at bedtime 08/05/15   Marcial Pacas, MD  Nutritional Supplements (ESTROVEN PO) Take 1 tablet by mouth daily.    Historical Provider, MD  oxyCODONE-acetaminophen (PERCOCET/ROXICET) 5-325 MG tablet Take 1-2 tablets by mouth every 4 (four) hours as needed for moderate pain or severe pain. 10/27/15   Rhetta Mura Schorr, NP  pregabalin (LYRICA) 100 MG capsule Take 1 capsule (100 mg total) by mouth 2 (two) times daily. 08/07/15   Marcial Pacas, MD  RESTASIS 0.05 % ophthalmic emulsion Place 1 drop into both eyes. Reported on 07/09/2015 07/08/15   Historical Provider, MD  traMADol Veatrice Bourbon) 50 MG tablet Take one to two tablets by mouth every 6 hours if needed for pain, Prescribed by Neurology 07/09/15   Dennie Bible, NP   Meds Ordered and Administered this Visit   Medications  HYDROmorphone (DILAUDID) injection 2 mg (not administered)    BP 135/89 mmHg  Pulse 120  Temp(Src) 99 F (37.2 C) (Oral)  Resp  16  Ht 5\' 4"  (1.626 m)  Wt 245 lb (111.131 kg)  BMI 42.03 kg/m2  SpO2 98% No data found.   Physical Exam  Constitutional: She is oriented to person, place, and time. She appears well-developed.  HENT:  Head: Normocephalic and atraumatic.  Eyes: Conjunctivae are normal.  Cardiovascular: Normal rate.   Pulmonary/Chest: Effort normal.  Musculoskeletal:       Right shoulder: She exhibits decreased range of motion. She exhibits no effusion and  no crepitus.  Very limited ROM d/t pain.  Neurological: She is alert and oriented to person, place, and time.  Skin: Skin is warm and dry.  Psychiatric: She has a normal mood and affect.    ED Course  Procedures (including critical care time)  Labs Review Labs Reviewed - No data to display  Imaging Review No results found.   Visual Acuity Review  Right Eye Distance:   Left Eye Distance:   Bilateral Distance:    Right Eye Near:   Left Eye Near:    Bilateral Near:         MDM   1. Right shoulder pain   Acute on chronic (R) shoulder pain. S/p surgery in 90's. No relief in past w/ steroid injections. Plans to see her ortho MD (Dr Ronnie Derby) but has not been able to manage pain with Norco at home. Dilaudid 2 mg given IM in office. Sling applied. Rx: Percocet 1-2 q6h #20. Pt to arrange f/u appointment w/ Dr Ronnie Derby on Monday.     Jeryl Columbia, NP 10/27/15 1459

## 2015-10-27 NOTE — Discharge Instructions (Signed)
Heat Therapy Heat therapy can help ease sore, stiff, injured, and tight muscles and joints. Heat relaxes your muscles, which may help ease your pain. Heat therapy should only be used on old, pre-existing, or long-lasting (chronic) injuries. Do not use heat therapy unless told by your doctor. HOW TO USE HEAT THERAPY There are several different kinds of heat therapy, including: 1. Moist heat pack. 2. Warm water bath. 3. Hot water bottle. 4. Electric heating pad. 5. Heated gel pack. 6. Heated wrap. 7. Electric heating pad. GENERAL HEAT THERAPY RECOMMENDATIONS  1. Do not sleep while using heat therapy. Only use heat therapy while you are awake. 2. Your skin may turn pink while using heat therapy. Do not use heat therapy if your skin turns red. 3. Do not use heat therapy if you have new pain. 4. High heat or long exposure to heat can cause burns. Be careful when using heat therapy to avoid burning your skin. 5. Do not use heat therapy on areas of your skin that are already irritated, such as with a rash or sunburn. GET HELP IF:  1. You have blisters, redness, swelling (puffiness), or numbness. 2. You have new pain. 3. Your pain is worse. MAKE SURE YOU: 1. Understand these instructions. 2. Will watch your condition. 3. Will get help right away if you are not doing well or get worse.   This information is not intended to replace advice given to you by your health care provider. Make sure you discuss any questions you have with your health care provider.   Document Released: 07/20/2011 Document Revised: 05/18/2014 Document Reviewed: 06/20/2013 Elsevier Interactive Patient Education 2016 Else[employer] Inc.   Shoulder Pain The shoulder is the joint that connects your arm to your body. Muscles and band-like tissues that connect bones to muscles (tendons) hold the joint together. Shoulder pain is felt if an injury or medical problem affects one or more parts of the shoulder. HOME CARE  8. Put ice  on the sore area. 1. Put ice in a plastic bag. 2. Place a towel between your skin and the bag. 3. Leave the ice on for 15-20 minutes, 03-04 times a day for the first 2 days. 9. Stop using cold packs if they do not help with the pain. 10. If you were given something to keep your shoulder from moving (sling; shoulder immobilizer), wear it as told. Only take it off to shower or bathe. 11. Move your arm as little as possible, but keep your hand moving to prevent puffiness (swelling). 12. Squeeze a soft ball or foam pad as much as possible to help prevent swelling. 13. Take medicine as told by your doctor. GET HELP IF: 6. You have progressing new pain in your arm, hand, or fingers. 7. Your hand or fingers get cold. 8. Your medicine does not help lessen your pain. GET HELP RIGHT AWAY IF:  4. Your arm, hand, or fingers are numb or tingling. 5. Your arm, hand, or fingers are puffy (swollen), painful, or turn white or blue. MAKE SURE YOU:  4. Understand these instructions. 5. Will watch your condition. 6. Will get help right away if you are not doing well or get worse.   This information is not intended to replace advice given to you by your health care provider. Make sure you discuss any questions you have with your health care provider.   Document Released: 10/14/2007 Document Revised: 05/18/2014 Document Reviewed: 08/20/2014 Elsevier Interactive Patient Education 2016 [employer].  Shoulder Range  of Motion Exercises Shoulder range of motion (ROM) exercises are designed to keep the shoulder moving freely. They are often recommended for people who have shoulder pain. MOVEMENT EXERCISE When you are able, do this exercise 5-6 days per week, or as told by your health care provider. Work toward doing 2 sets of 10 swings. Pendulum Exercise How To Do This Exercise Lying Down 14. Lie face-down on a bed with your abdomen close to the side of the bed. 15. Let your arm hang over the side of the  bed. 16. Relax your shoulder, arm, and hand. 17. Slowly and gently swing your arm forward and back. Do not use your neck muscles to swing your arm. They should be relaxed. If you are struggling to swing your arm, have someone gently swing it for you. When you do this exercise for the first time, swing your arm at a 15 degree angle for 15 seconds, or swing your arm 10 times. As pain lessens over time, increase the angle of the swing to 30-45 degrees. 18. Repeat steps 1-4 with the other arm. How To Do This Exercise While Standing 9. Stand next to a sturdy chair or table and hold on to it with your hand.  Bend forward at the waist.  Bend your knees slightly.  Relax your other arm and let it hang limp.  Relax the shoulder blade of the arm that is hanging and let it drop.  While keeping your shoulder relaxed, use body motion to swing your arm in small circles. The first time you do this exercise, swing your arm for about 30 seconds or 10 times. When you do it next time, swing your arm for a little longer.  Stand up tall and relax.  Repeat steps 1-7, this time changing the direction of the circles. 10. Repeat steps 1-8 with the other arm. STRETCHING EXERCISES Do these exercises 3-4 times per day on 5-6 days per week or as told by your health care provider. Work toward holding the stretch for 20 seconds. Stretching Exercise 1 6. Lift your arm straight out in front of you. 7. Bend your arm 90 degrees at the elbow (right angle) so your forearm goes across your body and looks like the letter "L." 8. Use your other arm to gently pull the elbow forward and across your body. 9. Repeat steps 1-3 with the other arm. Stretching Exercise 2 You will need a towel or rope for this exercise. 7. Bend one arm behind your back with the palm facing outward. 8. Hold a towel with your other hand. 9. Reach the arm that holds the towel above your head, and bend that arm at the elbow. Your wrist should be behind  your neck. 10. Use your free hand to grab the free end of the towel. 11. With the higher hand, gently pull the towel up behind you. 12. With the lower hand, pull the towel down behind you. 13. Repeat steps 1-6 with the other arm. STRENGTHENING EXERCISES Do each of these exercises at four different times of day (sessions) every day or as told by your health care provider. To begin with, repeat each exercise 5 times (repetitions). Work toward doing 3 sets of 12 repetitions or as told by your health care provider. Strengthening Exercise 1 You will need a light weight for this activity. As you grow stronger, you may use a heavier weight. 1. Standing with a weight in your hand, lift your arm straight out to the side until  it is at the same height as your shoulder. 2. Bend your arm at 90 degrees so that your fingers are pointing to the ceiling. 3. Slowly raise your hand until your arm is straight up in the air. 4. Repeat steps 1-3 with the other arm. Strengthening Exercise 2 You will need a light weight for this activity. As you grow stronger, you may use a heavier weight. 1. Standing with a weight in your hand, gradually move your straight arm in an arc, starting at your side, then out in front of you, then straight up over your head. 2. Gradually move your other arm in an arc, starting at your side, then out in front of you, then straight up over your head. 3. Repeat steps 1-2 with the other arm. Strengthening Exercise 3 You will need an elastic band for this activity. As you grow stronger, gradually increase the size of the bands or increase the number of bands that you use at one time. 1. While standing, hold an elastic band in one hand and raise that arm up in the air. 2. With your other hand, pull down the band until that hand is by your side. 3. Repeat steps 1-2 with the other arm.   This information is not intended to replace advice given to you by your health care provider. Make sure you  discuss any questions you have with your health care provider.   Document Released: 01/24/2003 Document Revised: 09/11/2014 Document Reviewed: 04/23/2014 Elsevier Interactive Patient Education [employer].

## 2015-10-31 ENCOUNTER — Other Ambulatory Visit: Payer: Self-pay | Admitting: Orthopedic Surgery

## 2015-10-31 DIAGNOSIS — R531 Weakness: Secondary | ICD-10-CM

## 2015-10-31 DIAGNOSIS — M25511 Pain in right shoulder: Secondary | ICD-10-CM

## 2015-10-31 DIAGNOSIS — R609 Edema, unspecified: Secondary | ICD-10-CM

## 2015-11-10 ENCOUNTER — Ambulatory Visit
Admission: RE | Admit: 2015-11-10 | Discharge: 2015-11-10 | Disposition: A | Discharge status: Home / Self Care | Payer: Medicare Other | Source: Ambulatory Visit | Attending: Orthopedic Surgery | Admitting: Orthopedic Surgery

## 2015-11-10 DIAGNOSIS — M25511 Pain in right shoulder: Secondary | ICD-10-CM

## 2015-11-10 DIAGNOSIS — R531 Weakness: Secondary | ICD-10-CM

## 2015-11-10 DIAGNOSIS — R609 Edema, unspecified: Secondary | ICD-10-CM

## 2015-12-31 ENCOUNTER — Encounter: Payer: Self-pay | Admitting: Neurology

## 2015-12-31 ENCOUNTER — Other Ambulatory Visit: Payer: Self-pay | Admitting: *Deleted

## 2015-12-31 ENCOUNTER — Ambulatory Visit (INDEPENDENT_AMBULATORY_CARE_PROVIDER_SITE_OTHER): Payer: Medicare Other | Admitting: Neurology

## 2015-12-31 VITALS — BP 142/92 | HR 103 | Ht 64.0 in | Wt 245.5 lb

## 2015-12-31 DIAGNOSIS — G609 Hereditary and idiopathic neuropathy, unspecified: Secondary | ICD-10-CM

## 2015-12-31 MED ORDER — LIDOCAINE-PRILOCAINE 2.5-2.5 % EX CREA: TOPICAL_CREAM | CUTANEOUS | 4 refills | Status: DC | PRN

## 2015-12-31 MED ORDER — METANX 3-90.314-2-35 MG PO CAPS: ORAL_CAPSULE | ORAL | 4 refills | Status: DC

## 2015-12-31 MED ORDER — TRAMADOL HCL 50 MG PO TABS: ORAL_TABLET | ORAL | 4 refills | Status: DC

## 2015-12-31 MED ORDER — NORTRIPTYLINE HCL 25 MG PO CAPS: 75.0000 mg | ORAL_CAPSULE | Freq: Every day | ORAL | 4 refills | Status: DC

## 2015-12-31 MED ORDER — PREGABALIN 100 MG PO CAPS: 100.0000 mg | ORAL_CAPSULE | Freq: Two times a day (BID) | ORAL | 4 refills | Status: DC

## 2015-12-31 NOTE — Progress Notes (Signed)
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 Exam demonstrated mild sensory loss in the feet, worse on the right, and diminished ankle jerks. Neuropathy labs were unremarkable, except for a slightly elevated HbA1c of 5.9. Advised to take a multivitamin. Gabapentin provided incomplete relief, and she had some difficulties with drowsiness. Nortriptyline was added at doses increasing to 50 mg q.h.s. She reports ongoing soreness of feet, not bad, but had 2 bouts of severe pain lasting 30-45 minutes, mostly in evenings; given tramadol.  12/29/12 Remains on Lyrica 100mg  BID and Nortriptyline 75mg  at night. . She continues to have occasional foot pain, She takes tramadol for pain. Transdermal cream was stopped due to insurance issues, she is back on that. She has stopped her Metanx insurance would not pay She is doing water aerobics 3 to 4 times weekly. She continues to retain fluid around the ankles.   UPDATE December 31 2015: She has right rotator cuff surgery recently, is in right shoulder sling, overall her bilateral feet paresthesia is under good control with current polypharmacy treatment, wants refill, tramadol 50 mg as needed, Lyrica 100 mg twice a day, nortriptyline 25 mg 3 tablets every night, Metanx 1 tablet twice a day, lidocaine-Prilocaine cream   REVIEW OF SYSTEMS: Full 14 system review of systems performed and notable only for those listed, all others are neg:  As above   ALLERGIES: Allergies  Allergen Reactions  . Erythromycin     REACTION: chest pain    HOME MEDICATIONS: Outpatient Medications Prior to Visit  Medication Sig Dispense Refill  . b complex vitamins tablet Take 1 tablet by mouth daily.    . Cholecalciferol (VITAMIN D-3 PO) Take 2,000 Units by mouth.     . cyanocobalamin  1000 MCG tablet Take 1,000 mcg by mouth daily.     Marland Kitchen HYDROcodone-acetaminophen (NORCO/VICODIN) 5-325 MG tablet Take 1-2 tablets by mouth every 4 (four) hours as needed for moderate pain. 20 tablet 0  . ibuprofen (ADVIL,MOTRIN) 200 MG tablet Take 200 mg by mouth every 6 (six) hours as needed for moderate pain.     Marland Kitchen L-Methylfolate-Algae-B12-B6 (METANX) 3-90.314-2-35 MG CAPS Take one capsule by mouth two times daily 180 capsule 3  . lidocaine-prilocaine (EMLA) cream Apply 1-2 applications  daily to affected area 180 g 3  . lisinopril (PRINIVIL,ZESTRIL) 10 MG tablet TAKE ONE TABLET BY MOUTH ONCE DAILY 90 tablet 1  . Multiple Vitamin (MULTIVITAMIN) tablet Take 1 tablet by mouth daily.      . nortriptyline (PAMELOR) 25 MG capsule Take 3 capsules by mouth at bedtime 270 capsule 3  . Nutritional Supplements (ESTROVEN PO) Take 1 tablet by mouth daily.    Marland Kitchen oxyCODONE-acetaminophen (PERCOCET/ROXICET) 5-325 MG tablet Take 1-2 tablets by mouth every 4 (four) hours as needed for moderate pain or severe pain. 20 tablet 0  . pregabalin (LYRICA) 100 MG capsule Take 1 capsule (100 mg total) by mouth 2 (two) times daily. 180 capsule 1  . RESTASIS 0.05 % ophthalmic emulsion Place 1 drop into both eyes. Reported on 07/09/2015    . traMADol (ULTRAM) 50 MG tablet Take one to two tablets by mouth every 6 hours if needed for pain, Prescribed by Neurology 180 tablet 1   No facility-administered medications prior to visit.     PAST MEDICAL HISTORY: Past Medical History:  Diagnosis Date  . Arthritis    osteo  . Bacterial infection   . Chronic kidney disease    kidney stones  . Diverticulosis of colon   . Elevated LFTs    neg for hep b and c in 2006  . Family history of adverse reaction to anesthesia    mother with sleep apnea  . History of mumps   . Hyperlipidemia   . Hypertension   . Kidney infection   . Kidney stones   . Neuropathy (HCC)    hands and feet  . Pelvic pain in female 10/27/10  . Trichomonas     . Yeast infection     PAST SURGICAL HISTORY: Past Surgical History:  Procedure Laterality Date  . ABDOMINAL HYSTERECTOMY    . CARPAL TUNNEL RELEASE    . CYSTOSCOPY WITH RETROGRADE PYELOGRAM, URETEROSCOPY AND STENT PLACEMENT Left 12/18/2014   Procedure: CYSTOSCOPY WITH RETROGRADE PYELOGRAM, URETEROSCOPY AND STENT PLACEMENT left ureter;  Surgeon: Raynelle Bring, MD;  Location: [facility];  Service: Urology;  Laterality: Left;  . DILATION AND CURETTAGE OF UTERUS    . HAND SURGERY  06/2011   2 surgeries on left hand  . HOLMIUM LASER APPLICATION Left [id number]   Procedure: HOLMIUM LASER APPLICATION left ureter ;  Surgeon: Raynelle Bring, MD;  Location: [facility];  Service: Urology;  Laterality: Left;  . KIDNEY STONE SURGERY    . KNEE SURGERY    . left elbow surgery    . PARATHYROIDECTOMY Right 05/03/2015   Procedure: RIGHT SUPERIOR PARATHYROIDECTOMY;  Surgeon: Armandina Gemma, MD;  Location: [facility];  Service: General;  Laterality: Right;  . REPLACEMENT TOTAL KNEE BILATERAL    . ROTATOR CUFF REPAIR    . TONSILLECTOMY    . TRIGGER FINGER RELEASE  2010   right  . VESICOVAGINAL FISTULA CLOSURE W/ TAH    . WRIST SURGERY  10/2011   right wrist    FAMILY HISTORY: Family History  Problem Relation Age of Onset  . Stroke Mother   . Cancer Mother 58    bone cancer  . Colon cancer Neg Hx   . Esophageal cancer Neg Hx   . Rectal cancer Neg Hx   . Stomach cancer Neg Hx   . Diabetes Sister     SOCIAL HISTORY: Social History   Social History  . Marital status: Single    Spouse name: N/A  . Number of children: 1  . 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 Smoking status: Never Smoker  . Smokeless tobacco: Never Used  . Alcohol use No  . Drug use: No  . Sexual activity: No   Other Topics Concern  . Not on file   Social History Narrative   Patient is single, has 1 child   Patient is right handed   Caffeine consumption is 0      PHYSICAL EXAM  Vitals:   12/31/15 0717  BP: (!) 142/92  Pulse: (!) 103  Weight: 245 lb 8 oz (111.4 kg)  Height: 5\' 4"  (1.626 m)   Body mass index is 42.14 kg/m. Generalized: Well developed, obese female in no acute distress  Head: normocephalic and atraumatic,. Oropharynx benign  Neck: Supple, no carotid bruits  Cardiac: Regular rate rhythm, no murmur  Musculoskeletal: No deformity   Neurological examination   Mentation: Alert oriented to time, place, history taking. Attention span and concentration appropriate. Recent and remote memory intact. Follows  all commands speech and language fluent.   Cranial nerve II-XII:  Pupils were equal round reactive to light extraocular movements were full, visual field were full on confrontational test. Facial sensation and strength were normal. hearing was intact to finger rubbing bilaterally. Uvula tongue midline. head turning and shoulder shrug were normal and symmetric.Tongue protrusion into cheek strength was normal. Motor: normal bulk and tone, full strength in the BUE, BLE, fine finger movements normal, no pronator drift. No focal weakness Sensory: normal and symmetric to light touch, Vibration, proprioception , decreased pinprick in a stocking fashion to mid shin. Coordination: finger-nose-finger, heel-to-shin bilaterally, no dysmetria Reflexes: 1+ upper and lower and symmetric except absent ankle jerks plantar responses were flexor bilaterally. Gait and Station: Rising up from seated position without assistance, right arm in sling  DIAGNOSTIC DATA (LABS, IMAGING, TESTING) - I reviewed patient records, labs, notes, testing and imaging myself where available.  Lab Results  Component Value Date   WBC 3.9 (L) 09/02/2015   HGB 13.1 09/02/2015   HCT 38.6 09/02/2015   MCV 86.6 09/02/2015   PLT 184.0 09/02/2015      Component Value Date/Time   NA 139 09/02/2015 0801   K 3.8 09/02/2015 0801   CL 104 09/02/2015 0801   CO2 28  09/02/2015 0801   GLUCOSE 121 (H) 09/02/2015 0801   BUN 11 09/02/2015 0801   CREATININE 0.75 09/02/2015 0801   CALCIUM 9.7 09/02/2015 0801   CALCIUM 9.8 06/17/2009 2212   PROT 7.1 09/02/2015 0801   ALBUMIN 4.0 09/02/2015 0801   AST 44 (H) 09/02/2015 0801   ALT 23 09/02/2015 0801   ALKPHOS 71 09/02/2015 0801   BILITOT 0.5 09/02/2015 0801   GFRNONAA >60 05/01/2015 1415   GFRAA >60 05/01/2015 1415   Lab Results  Component Value Date   CHOL 226 (H) 09/02/2015   HDL 44.20 09/02/2015   LDLCALC 155 (H) 09/02/2015   LDLDIRECT 139.1 02/11/2011   TRIG 137.0 09/02/2015   CHOLHDL 5 09/02/2015    Lab Results  Component Value Date   TSH 1.05 09/02/2015      ASSESSMENT AND PLAN 65 y.o. year old female  Peripheral neuropathy  Idiopathic small fiber neuropathy,  No etiology found based on previous extensive evaluation  Continue current polypharmacy treatment Lyrica 100 twice a day, nortriptyline 25 mg 3 tablets every night, lidocaine-prilocaine cream, Metanx twice a day, tramadol as needed   Marcial Pacas, M.D. Ph.D.  [facility] [place], [place] [address] Phone: [phone] Fax:      [phone]

## 2016-01-10 HISTORY — PX: COLONOSCOPY: SHX174

## 2016-01-28 ENCOUNTER — Telehealth: Payer: Self-pay | Admitting: Neurology

## 2016-01-28 NOTE — Telephone Encounter (Signed)
Pt requesting rx for L-Methylfolate-Algae-B12-B6 [facility]) 3-90.314-2-35 MG CAPS be sent to [place], [address]  [facility] will not transfer the medication Also , please note pt states this is the only medication at this time that goes to [facility].

## 2016-01-29 NOTE — Telephone Encounter (Signed)
Rx faxed and confimed to [facility] at [phone].

## 2016-02-03 ENCOUNTER — Other Ambulatory Visit: Payer: Self-pay | Admitting: Internal Medicine

## 2016-02-13 ENCOUNTER — Other Ambulatory Visit: Payer: Self-pay | Admitting: *Deleted

## 2016-02-13 MED ORDER — LIDOCAINE-PRILOCAINE 2.5-2.5 % EX CREA: TOPICAL_CREAM | CUTANEOUS | 3 refills | Status: DC

## 2016-02-19 ENCOUNTER — Telehealth: Payer: Self-pay | Admitting: Neurology

## 2016-02-19 ENCOUNTER — Other Ambulatory Visit: Payer: Self-pay | Admitting: *Deleted

## 2016-02-19 MED ORDER — LIDOCAINE-PRILOCAINE 2.5-2.5 % EX CREA: TOPICAL_CREAM | CUTANEOUS | 3 refills | Status: DC

## 2016-02-19 NOTE — Telephone Encounter (Signed)
Pt called in stating lidocaine-prilocaine (EMLA) cream rx is wrong. She says she is to be getting 2 - 30g tube a month not 1- 30g tube. Please call and advise [phone]

## 2016-02-19 NOTE — Telephone Encounter (Signed)
Called [facility] to update rx and they will send the patient the correct amount of medication.  Patient is aware.

## 2016-03-11 ENCOUNTER — Encounter: Payer: Self-pay | Admitting: Internal Medicine

## 2016-03-11 ENCOUNTER — Ambulatory Visit (INDEPENDENT_AMBULATORY_CARE_PROVIDER_SITE_OTHER): Payer: Medicare Other | Admitting: Internal Medicine

## 2016-03-11 VITALS — BP 122/80 | HR 110 | Temp 98.7°F | Resp 20 | Ht 64.0 in | Wt 258.0 lb

## 2016-03-11 DIAGNOSIS — E785 Hyperlipidemia, unspecified: Secondary | ICD-10-CM | POA: Diagnosis not present

## 2016-03-11 DIAGNOSIS — R7302 Impaired glucose tolerance (oral): Secondary | ICD-10-CM | POA: Diagnosis not present

## 2016-03-11 DIAGNOSIS — Z23 Encounter for immunization: Secondary | ICD-10-CM

## 2016-03-11 DIAGNOSIS — I10 Essential (primary) hypertension: Secondary | ICD-10-CM

## 2016-03-11 LAB — POCT GLUCOSE (DEVICE FOR HOME USE): Glucose Fasting, POC: 106 mg/dL — AB (ref 70–99)

## 2016-03-11 NOTE — Progress Notes (Signed)
Pre visit review using our clinic review tool, if applicable. No additional management support is needed unless otherwise documented below in the visit note. 

## 2016-03-11 NOTE — Patient Instructions (Signed)

## 2016-03-11 NOTE — Addendum Note (Signed)
Addended by: Marian Sorrow on: 03/11/2016 11:52 AM   Modules accepted: Orders

## 2016-03-11 NOTE — Progress Notes (Signed)
Subjective:    Patient ID: Katherine [name], female    DOB: 10-Mar-1951, 65 y.o.   MRN: [id number]  HPI  65 year old patient who is seen today for her biannual follow-up.  She is followed closely by neurology due to peripheral neuropathy.  She has essential hypertension, dyslipidemia and a history of impaired glucose tolerance.  She fell earlier today with some soft tissue trauma involving her left lower arm.  Otherwise, doing reasonably well.  She requires polypharmacy for control of her neuropathic symptoms.  Since her last visit here, she has had rotator cuff surgery.  Past Medical History:  Diagnosis Date  . Arthritis    osteo  . Bacterial infection   . Chronic kidney disease    kidney stones  . Diverticulosis of colon   . Elevated LFTs    neg for hep b and c in 2006  . Family history of adverse reaction to anesthesia    mother with sleep apnea  . History of mumps   . Hyperlipidemia   . Hypertension   . Kidney infection   . Kidney stones   . Neuropathy (HCC)    hands and feet  . Pelvic pain in female 10/27/10  . Trichomonas   . Yeast infection      Social History   Social History  . Marital status: Single    Spouse name: N/A  . Number of children: 1  . 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 Smoking status: Never Smoker  . Smokeless tobacco: Never Used  . Alcohol use No  . Drug use: No  . Sexual activity: No   Other Topics Concern  . Not on file   Social History Narrative   Patient is single, has 1 child   Patient is right handed   Caffeine consumption is 0    Past Surgical History:  Procedure Laterality Date  . ABDOMINAL HYSTERECTOMY    . CARPAL TUNNEL RELEASE    . CYSTOSCOPY WITH RETROGRADE PYELOGRAM, URETEROSCOPY AND STENT PLACEMENT Left 12/18/2014   Procedure: CYSTOSCOPY WITH RETROGRADE PYELOGRAM, URETEROSCOPY AND STENT PLACEMENT left ureter;  Surgeon: Raynelle Bring, MD;  Location: [facility];   Service: Urology;  Laterality: Left;  . DILATION AND CURETTAGE OF UTERUS    . HAND SURGERY  06/2011   2 surgeries on left hand  . HOLMIUM LASER APPLICATION Left [id number]   Procedure: HOLMIUM LASER APPLICATION left ureter ;  Surgeon: Raynelle Bring, MD;  Location: [facility];  Service: Urology;  Laterality: Left;  . KIDNEY STONE SURGERY    . KNEE SURGERY    . left elbow surgery    . PARATHYROIDECTOMY Right 05/03/2015   Procedure: RIGHT SUPERIOR PARATHYROIDECTOMY;  Surgeon: Armandina Gemma, MD;  Location: [facility];  Service: General;  Laterality: Right;  . REPLACEMENT TOTAL KNEE BILATERAL    . ROTATOR CUFF REPAIR    . TONSILLECTOMY    . TRIGGER FINGER RELEASE  2010   right  . VESICOVAGINAL FISTULA CLOSURE W/ TAH    . WRIST SURGERY  10/2011   right wrist    Family History  Problem Relation Age of Onset  . Stroke Mother   . Cancer Mother 88    bone cancer  . Colon cancer Neg Hx   . Esophageal cancer Neg Hx   . Rectal cancer Neg Hx   . Stomach cancer Neg Hx   . Diabetes Sister  Allergies  Allergen Reactions  . Erythromycin     REACTION: chest pain    Current Outpatient Prescriptions on File Prior to Visit  Medication Sig Dispense Refill  . b complex vitamins tablet Take 1 tablet by mouth daily.    . Cholecalciferol (VITAMIN D-3 PO) Take 2,000 Units by mouth.     Marland Kitchen HYDROcodone-acetaminophen (NORCO/VICODIN) 5-325 MG tablet Take 1-2 tablets by mouth every 4 (four) hours as needed for moderate pain. 20 tablet 0  . ibuprofen (ADVIL,MOTRIN) 200 MG tablet Take 200 mg by mouth every 6 (six) hours as needed for moderate pain.     Marland Kitchen L-Methylfolate-Algae-B12-B6 (METANX) 3-90.314-2-35 MG CAPS Take one capsule by mouth two times daily 180 capsule 4  . lidocaine-prilocaine (EMLA) cream Apply four times per day as needed. 180 g 3  . lisinopril (PRINIVIL,ZESTRIL) 10 MG tablet TAKE ONE TABLET BY MOUTH ONCE DAILY 90 tablet 1  . Multiple Vitamin (MULTIVITAMIN) tablet Take 1 tablet by mouth daily.        . nortriptyline (PAMELOR) 25 MG capsule Take 3 capsules (75 mg total) by mouth at bedtime. 270 capsule 4  . Nutritional Supplements (ESTROVEN PO) Take 1 tablet by mouth daily.    Marland Kitchen oxyCODONE-acetaminophen (PERCOCET/ROXICET) 5-325 MG tablet Take 1-2 tablets by mouth every 4 (four) hours as needed for moderate pain or severe pain. 20 tablet 0  . pregabalin (LYRICA) 100 MG capsule Take 1 capsule (100 mg total) by mouth 2 (two) times daily. 180 capsule 4  . RESTASIS 0.05 % ophthalmic emulsion Place 1 drop into both eyes. Reported on 07/09/2015    . traMADol (ULTRAM) 50 MG tablet Take one to two tablets by mouth every 6 hours if needed for pain, Prescribed by Neurology 180 tablet 4  . [DISCONTINUED] gabapentin (NEURONTIN) 300 MG capsule Take 300 mg by mouth 3 (three) times daily.       No current facility-administered medications on file prior to visit.     BP 122/80 (BP Location: Right Arm, Patient Position: Sitting, Cuff Size: Large)   Pulse (!) 110   Temp 98.7 F (37.1 C) (Oral)   Resp 20   Ht 5\' 4"  (1.626 m)   Wt 258 lb (117 kg)   SpO2 96%   BMI 44.29 kg/m     Review of Systems  HENT: Negative for congestion, dental problem, hearing loss, rhinorrhea, sinus pressure, sore throat and tinnitus.   Eyes: Negative for pain, discharge and visual disturbance.  Respiratory: Negative for cough and shortness of breath.   Cardiovascular: Negative for chest pain, palpitations and leg swelling.  Gastrointestinal: Negative for abdominal distention, abdominal pain, blood in stool, constipation, diarrhea, nausea and vomiting.  Genitourinary: Negative for difficulty urinating, dysuria, flank pain, frequency, hematuria, pelvic pain, urgency, vaginal bleeding, vaginal discharge and vaginal pain.  Musculoskeletal: Positive for arthralgias. Negative for gait problem and joint swelling.  Skin: Negative for rash.  Neurological: Positive for weakness and numbness. Negative for dizziness, syncope, speech  difficulty and headaches.  Hematological: Negative for adenopathy.  Psychiatric/Behavioral: Negative for agitation, behavioral problems and dysphoric mood. The patient is not nervous/anxious.        Objective:   Physical Exam  Constitutional: She is oriented to person, place, and time. She appears well-developed and well-nourished.  Weight 258 Blood pressure well controlled  HENT:  Head: Normocephalic.  Right Ear: External ear normal.  Left Ear: External ear normal.  Mouth/Throat: Oropharynx is clear and moist.  Eyes: Conjunctivae and EOM are normal. Pupils are  equal, round, and reactive to light.  Neck: Normal range of motion. Neck supple. No thyromegaly present.  Cardiovascular: Normal rate, regular rhythm, normal heart sounds and intact distal pulses.   Pulmonary/Chest: Effort normal and breath sounds normal.  Abdominal: Soft. Bowel sounds are normal. She exhibits no mass. There is no tenderness.  Musculoskeletal: Normal range of motion.  Trace edema  Lymphadenopathy:    She has no cervical adenopathy.  Neurological: She is alert and oriented to person, place, and time.  Skin: Skin is warm and dry. No rash noted.  Psychiatric: She has a normal mood and affect. Her behavior is normal.          Assessment & Plan:   Essential hypertension, well-controlled Impaired glucose tolerance, stable.  Random blood sugar 106 Obesity.  Weight loss encouraged Peripheral neuropathy  No change in medical therapy Annual exam 6 months Flu vaccine administered  Nyoka Cowden

## 2016-06-22 ENCOUNTER — Ambulatory Visit (INDEPENDENT_AMBULATORY_CARE_PROVIDER_SITE_OTHER): Payer: Medicare Other | Admitting: Internal Medicine

## 2016-06-22 ENCOUNTER — Encounter: Payer: Self-pay | Admitting: Internal Medicine

## 2016-06-22 VITALS — BP 128/80 | HR 100 | Temp 98.7°F | Ht 64.0 in | Wt 245.2 lb

## 2016-06-22 DIAGNOSIS — E213 Hyperparathyroidism, unspecified: Secondary | ICD-10-CM

## 2016-06-22 DIAGNOSIS — R7302 Impaired glucose tolerance (oral): Secondary | ICD-10-CM

## 2016-06-22 DIAGNOSIS — Z23 Encounter for immunization: Secondary | ICD-10-CM

## 2016-06-22 DIAGNOSIS — I10 Essential (primary) hypertension: Secondary | ICD-10-CM | POA: Diagnosis not present

## 2016-06-22 DIAGNOSIS — R251 Tremor, unspecified: Secondary | ICD-10-CM

## 2016-06-22 DIAGNOSIS — G609 Hereditary and idiopathic neuropathy, unspecified: Secondary | ICD-10-CM

## 2016-06-22 LAB — COMPREHENSIVE METABOLIC PANEL
ALK PHOS: 95 U/L (ref 39–117)
ALT: 41 U/L — ABNORMAL HIGH (ref 0–35)
AST: 56 U/L — ABNORMAL HIGH (ref 0–37)
Albumin: 4 g/dL (ref 3.5–5.2)
BUN: 9 mg/dL (ref 6–23)
CO2: 27 meq/L (ref 19–32)
Calcium: 9.8 mg/dL (ref 8.4–10.5)
Chloride: 106 mEq/L (ref 96–112)
Creatinine, Ser: 0.79 mg/dL (ref 0.40–1.20)
GFR: 93.3 mL/min (ref >60.00)
GLUCOSE: 107 mg/dL — AB (ref 70–99)
POTASSIUM: 4.5 meq/L (ref 3.5–5.1)
Sodium: 140 mEq/L (ref 135–145)
TOTAL PROTEIN: 7 g/dL (ref 6.0–8.3)
Total Bilirubin: 0.6 mg/dL (ref 0.2–1.2)

## 2016-06-22 LAB — TSH: TSH: 0.94 u[IU]/mL (ref 0.35–4.50)

## 2016-06-22 LAB — HEMOGLOBIN A1C: HEMOGLOBIN A1C: 6.4 % (ref 4.6–6.5)

## 2016-06-22 NOTE — Progress Notes (Signed)
Subjective:    Patient ID: Katherine [name], female    DOB: 1950-11-20, 66 y.o.   MRN: [id number]  HPI  67 year old patient who is seen today for follow-up.  She has a history of essential hypertension and also has a history of significant peripheral neuropathy.  She has been evaluated by neurology in the past Complaints the include balance issues.  She states she becomes lightheaded and dizzy.  She also complains of tremor involving the hands. She has a history of primary hyperparathyroidism.  Past Medical History:  Diagnosis Date  . Arthritis    osteo  . Bacterial infection   . Chronic kidney disease    kidney stones  . Diverticulosis of colon   . Elevated LFTs    neg for hep b and c in 2006  . Family history of adverse reaction to anesthesia    mother with sleep apnea  . History of mumps   . Hyperlipidemia   . Hypertension   . Kidney infection   . Kidney stones   . Neuropathy (HCC)    hands and feet  . Pelvic pain in female 10/27/10  . Trichomonas   . Yeast infection      Social History   Social History  . Marital status: Single    Spouse name: N/A  . Number of children: 1  . 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 Smoking status: Never Smoker  . Smokeless tobacco: Never Used  . Alcohol use No  . Drug use: No  . Sexual activity: No   Other Topics Concern  . Not on file   Social History Narrative   Patient is single, has 1 child   Patient is right handed   Caffeine consumption is 0    Past Surgical History:  Procedure Laterality Date  . ABDOMINAL HYSTERECTOMY    . CARPAL TUNNEL RELEASE    . CYSTOSCOPY WITH RETROGRADE PYELOGRAM, URETEROSCOPY AND STENT PLACEMENT Left 12/18/2014   Procedure: CYSTOSCOPY WITH RETROGRADE PYELOGRAM, URETEROSCOPY AND STENT PLACEMENT left ureter;  Surgeon: Raynelle Bring, MD;  Location: [facility];  Service: Urology;  Laterality: Left;  . DILATION AND CURETTAGE OF UTERUS      . HAND SURGERY  06/2011   2 surgeries on left hand  . HOLMIUM LASER APPLICATION Left [id number]   Procedure: HOLMIUM LASER APPLICATION left ureter ;  Surgeon: Raynelle Bring, MD;  Location: [facility];  Service: Urology;  Laterality: Left;  . KIDNEY STONE SURGERY    . KNEE SURGERY    . left elbow surgery    . PARATHYROIDECTOMY Right 05/03/2015   Procedure: RIGHT SUPERIOR PARATHYROIDECTOMY;  Surgeon: Armandina Gemma, MD;  Location: [facility];  Service: General;  Laterality: Right;  . REPLACEMENT TOTAL KNEE BILATERAL    . ROTATOR CUFF REPAIR    . TONSILLECTOMY    . TRIGGER FINGER RELEASE  2010   right  . VESICOVAGINAL FISTULA CLOSURE W/ TAH    . WRIST SURGERY  10/2011   right wrist    Family History  Problem Relation Age of Onset  . Stroke Mother   . Cancer Mother 17    bone cancer  . Colon cancer Neg Hx   . Esophageal cancer Neg Hx   . Rectal cancer Neg Hx   . Stomach cancer Neg Hx   . Diabetes Sister     Allergies  Allergen Reactions  . Erythromycin  REACTION: chest pain    Current Outpatient Prescriptions on File Prior to Visit  Medication Sig Dispense Refill  . b complex vitamins tablet Take 1 tablet by mouth daily.    . Cholecalciferol (VITAMIN D-3 PO) Take 2,000 Units by mouth.     Marland Kitchen HYDROcodone-acetaminophen (NORCO/VICODIN) 5-325 MG tablet Take 1-2 tablets by mouth every 4 (four) hours as needed for moderate pain. 20 tablet 0  . ibuprofen (ADVIL,MOTRIN) 200 MG tablet Take 200 mg by mouth every 6 (six) hours as needed for moderate pain.     Marland Kitchen L-Methylfolate-Algae-B12-B6 (METANX) 3-90.314-2-35 MG CAPS Take one capsule by mouth two times daily 180 capsule 4  . lidocaine-prilocaine (EMLA) cream Apply four times per day as needed. 180 g 3  . lisinopril (PRINIVIL,ZESTRIL) 10 MG tablet TAKE ONE TABLET BY MOUTH ONCE DAILY 90 tablet 1  . Multiple Vitamin (MULTIVITAMIN) tablet Take 1 tablet by mouth daily.      . nortriptyline (PAMELOR) 25 MG capsule Take 3 capsules (75 mg total) by  mouth at bedtime. 270 capsule 4  . Nutritional Supplements (ESTROVEN PO) Take 1 tablet by mouth daily.    . pregabalin (LYRICA) 100 MG capsule Take 1 capsule (100 mg total) by mouth 2 (two) times daily. 180 capsule 4  . RESTASIS 0.05 % ophthalmic emulsion Place 1 drop into both eyes. Reported on 07/09/2015    . traMADol (ULTRAM) 50 MG tablet Take one to two tablets by mouth every 6 hours if needed for pain, Prescribed by Neurology 180 tablet 4  . [DISCONTINUED] gabapentin (NEURONTIN) 300 MG capsule Take 300 mg by mouth 3 (three) times daily.       No current facility-administered medications on file prior to visit.     BP 128/80 (BP Location: Left Arm, Patient Position: Sitting, Cuff Size: Large)   Pulse 100   Temp 98.7 F (37.1 C) (Oral)   Ht 5\' 4"  (1.626 m)   Wt 245 lb 3.2 oz (111.2 kg)   SpO2 98%   BMI 42.09 kg/m     Review of Systems  HENT: Negative for congestion, dental problem, hearing loss, rhinorrhea, sinus pressure, sore throat and tinnitus.   Eyes: Negative for pain, discharge and visual disturbance.  Respiratory: Negative for cough and shortness of breath.   Cardiovascular: Negative for chest pain, palpitations and leg swelling.  Gastrointestinal: Negative for abdominal distention, abdominal pain, blood in stool, constipation, diarrhea, nausea and vomiting.  Genitourinary: Negative for difficulty urinating, dysuria, flank pain, frequency, hematuria, pelvic pain, urgency, vaginal bleeding, vaginal discharge and vaginal pain.  Musculoskeletal: Positive for gait problem. Negative for arthralgias and joint swelling.  Skin: Negative for rash.  Neurological: Positive for dizziness, speech difficulty, weakness and light-headedness. Negative for syncope, numbness and headaches.  Hematological: Negative for adenopathy.  Psychiatric/Behavioral: Negative for agitation, behavioral problems and dysphoric mood. The patient is not nervous/anxious.        Objective:   Physical Exam    Constitutional: She is oriented to person, place, and time. She appears well-developed and well-nourished.  Weight 245 Blood pressure 124/76  HENT:  Head: Normocephalic.  Right Ear: External ear normal.  Left Ear: External ear normal.  Mouth/Throat: Oropharynx is clear and moist.  Eyes: Conjunctivae and EOM are normal. Pupils are equal, round, and reactive to light.  Neck: Normal range of motion. Neck supple. No thyromegaly present.  Cardiovascular: Normal rate, regular rhythm, normal heart sounds and intact distal pulses.   Pulmonary/Chest: Effort normal and breath sounds normal.  Abdominal:  Soft. Bowel sounds are normal. She exhibits no mass. There is no tenderness.  Musculoskeletal: Normal range of motion.  Lymphadenopathy:    She has no cervical adenopathy.  Neurological: She is alert and oriented to person, place, and time.  Normal finger to nose testing Gait unsteady but not ataxic Heel to shin testing slow and deliberate but not ataxic  Skin: Skin is warm and dry. No rash noted.  Psychiatric: She has a normal mood and affect. Her behavior is normal.          Assessment & Plan:   Peripheral neuropathy Unsteady gait History of hand tremors.  Will check a TSH History of primary hyperparathyroidism.  We'll check calcium level Essential hypertension, well-controlled  We'll check updated lab Consider follow-up neurology  Nyoka Cowden

## 2016-06-22 NOTE — Patient Instructions (Signed)

## 2016-06-22 NOTE — Progress Notes (Signed)
Pre visit review using our clinic review tool, if applicable. No additional management support is needed unless otherwise documented below in the visit note. 

## 2016-07-01 ENCOUNTER — Ambulatory Visit (INDEPENDENT_AMBULATORY_CARE_PROVIDER_SITE_OTHER): Payer: Medicare Other | Admitting: Family Medicine

## 2016-07-01 VITALS — BP 122/90 | HR 115 | Temp 98.1°F | Ht 64.0 in | Wt 246.0 lb

## 2016-07-01 DIAGNOSIS — B9789 Other viral agents as the cause of diseases classified elsewhere: Secondary | ICD-10-CM

## 2016-07-01 DIAGNOSIS — J069 Acute upper respiratory infection, unspecified: Secondary | ICD-10-CM

## 2016-07-01 MED ORDER — AMOXICILLIN-POT CLAVULANATE 875-125 MG PO TABS: 1.0000 | ORAL_TABLET | Freq: Two times a day (BID) | ORAL | 0 refills | Status: DC

## 2016-07-01 NOTE — Patient Instructions (Signed)
If sinus symptoms not improving over the next 5-6 days consider starting the antibiotic Start sooner for any fever or worsening symptoms.

## 2016-07-01 NOTE — Progress Notes (Signed)
Pre visit review using our clinic review tool, if applicable. No additional management support is needed unless otherwise documented below in the visit note. 

## 2016-07-01 NOTE — Progress Notes (Signed)
Subjective:     Patient ID: Katherine [name], female   DOB: 10/12/1950, 66 y.o.   MRN: [id number]  HPI Patient seen with upper respiratory symptoms. Onset about 5 days ago of productive cough, nasal congestion, yellowish nasal discharge, intermittent headaches. Some increased malaise. No fevers or chills. Mild sore throat. Patient is nonsmoker. No exacerbating or alleviating factors.  Past Medical History:  Diagnosis Date  . Arthritis    osteo  . Bacterial infection   . Chronic kidney disease    kidney stones  . Diverticulosis of colon   . Elevated LFTs    neg for hep b and c in 2006  . Family history of adverse reaction to anesthesia    mother with sleep apnea  . History of mumps   . Hyperlipidemia   . Hypertension   . Kidney infection   . Kidney stones   . Neuropathy (HCC)    hands and feet  . Pelvic pain in female 10/27/10  . Trichomonas   . Yeast infection    Past Surgical History:  Procedure Laterality Date  . ABDOMINAL HYSTERECTOMY    . CARPAL TUNNEL RELEASE    . CYSTOSCOPY WITH RETROGRADE PYELOGRAM, URETEROSCOPY AND STENT PLACEMENT Left 12/18/2014   Procedure: CYSTOSCOPY WITH RETROGRADE PYELOGRAM, URETEROSCOPY AND STENT PLACEMENT left ureter;  Surgeon: Raynelle Bring, MD;  Location: [facility];  Service: Urology;  Laterality: Left;  . DILATION AND CURETTAGE OF UTERUS    . HAND SURGERY  06/2011   2 surgeries on left hand  . HOLMIUM LASER APPLICATION Left [id number]   Procedure: HOLMIUM LASER APPLICATION left ureter ;  Surgeon: Raynelle Bring, MD;  Location: [facility];  Service: Urology;  Laterality: Left;  . KIDNEY STONE SURGERY    . KNEE SURGERY    . left elbow surgery    . PARATHYROIDECTOMY Right 05/03/2015   Procedure: RIGHT SUPERIOR PARATHYROIDECTOMY;  Surgeon: Armandina Gemma, MD;  Location: [facility];  Service: General;  Laterality: Right;  . REPLACEMENT TOTAL KNEE BILATERAL    . ROTATOR CUFF REPAIR    . TONSILLECTOMY    . TRIGGER FINGER RELEASE  2010   right  . VESICOVAGINAL  FISTULA CLOSURE W/ TAH    . WRIST SURGERY  10/2011   right wrist    reports that she has never smoked. She has never used smokeless tobacco. She reports that she does not drink alcohol or use drugs. family history includes Cancer (age of onset: 18) in her mother; Diabetes in her sister; Stroke in her mother. Allergies  Allergen Reactions  . Erythromycin     REACTION: chest pain     Review of Systems  Constitutional: Positive for fatigue. Negative for chills and fever.  HENT: Positive for sinus pain and sinus pressure.   Respiratory: Positive for cough.   Neurological: Positive for headaches.       Objective:   Physical Exam  Constitutional: She appears well-developed and well-nourished.  HENT:  Right Ear: External ear normal.  Left Ear: External ear normal.  Mouth/Throat: Oropharynx is clear and moist. No oropharyngeal exudate.  Neck: Neck supple.  Cardiovascular: Normal rate and regular rhythm.   Pulmonary/Chest: Effort normal and breath sounds normal. No respiratory distress. She has no wheezes. She has no rales.  Lymphadenopathy:    She has no cervical adenopathy.       Assessment:     URI with cough. Suspect viral    Plan:     -We recommend good hydration and observation for now. -  If symptoms persisting beyond 10 days to 2 weeks consider course of Augmentin 875 twice daily. Prescription printed out but she will hold this time. -Consider over-the-counter Mucinex twice daily  Eulas Post MD [facility]

## 2016-08-04 ENCOUNTER — Other Ambulatory Visit: Payer: Self-pay | Admitting: Internal Medicine

## 2016-08-27 ENCOUNTER — Other Ambulatory Visit: Payer: Self-pay | Admitting: *Deleted

## 2016-08-27 MED ORDER — PREGABALIN 100 MG PO CAPS: 100.0000 mg | ORAL_CAPSULE | Freq: Two times a day (BID) | ORAL | 1 refills | Status: DC

## 2016-08-31 ENCOUNTER — Ambulatory Visit: Payer: Medicare Other | Admitting: Internal Medicine

## 2016-09-21 ENCOUNTER — Encounter: Payer: Self-pay | Admitting: Internal Medicine

## 2016-09-21 ENCOUNTER — Ambulatory Visit (INDEPENDENT_AMBULATORY_CARE_PROVIDER_SITE_OTHER): Payer: Medicare Other | Admitting: Internal Medicine

## 2016-09-21 VITALS — BP 118/78 | HR 106 | Temp 97.8°F | Ht 64.0 in | Wt 252.8 lb

## 2016-09-21 DIAGNOSIS — E785 Hyperlipidemia, unspecified: Secondary | ICD-10-CM

## 2016-09-21 DIAGNOSIS — I10 Essential (primary) hypertension: Secondary | ICD-10-CM

## 2016-09-21 DIAGNOSIS — G609 Hereditary and idiopathic neuropathy, unspecified: Secondary | ICD-10-CM

## 2016-09-21 MED ORDER — LIDOCAINE-PRILOCAINE 2.5-2.5 % EX CREA: TOPICAL_CREAM | CUTANEOUS | 3 refills | Status: DC

## 2016-09-21 NOTE — Progress Notes (Signed)
Subjective:    Patient ID: Katherine [name], female    DOB: February 26, 1951, 66 y.o.   MRN: [id number]  HPI  66 year old patient who is seen today for follow-up.  She has a significant history of peripheral neuropathy that affects hands significantly as well as lower extremity This continues to be her chief complaint. She is also followed by gynecology and is bothered by vasomotor symptoms. No new concerns or complaints. She has essential hypertension that has been well-controlled.  Past Medical History:  Diagnosis Date  . Arthritis    osteo  . Bacterial infection   . Chronic kidney disease    kidney stones  . Diverticulosis of colon   . Elevated LFTs    neg for hep b and c in 2006  . Family history of adverse reaction to anesthesia    mother with sleep apnea  . History of mumps   . Hyperlipidemia   . Hypertension   . Kidney infection   . Kidney stones   . Neuropathy    hands and feet  . Pelvic pain in female 10/27/10  . Trichomonas   . Yeast infection      Social History   Social History  . Marital status: Single    Spouse name: N/A  . Number of children: 1  . 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 Smoking status: Never Smoker  . Smokeless tobacco: Never Used  . Alcohol use No  . Drug use: No  . Sexual activity: No   Other Topics Concern  . Not on file   Social History Narrative   Patient is single, has 1 child   Patient is right handed   Caffeine consumption is 0    Past Surgical History:  Procedure Laterality Date  . ABDOMINAL HYSTERECTOMY    . CARPAL TUNNEL RELEASE    . CYSTOSCOPY WITH RETROGRADE PYELOGRAM, URETEROSCOPY AND STENT PLACEMENT Left 12/18/2014   Procedure: CYSTOSCOPY WITH RETROGRADE PYELOGRAM, URETEROSCOPY AND STENT PLACEMENT left ureter;  Surgeon: Raynelle Bring, MD;  Location: [facility];  Service: Urology;  Laterality: Left;  . DILATION AND CURETTAGE OF UTERUS    . HAND SURGERY   06/2011   2 surgeries on left hand  . HOLMIUM LASER APPLICATION Left 0/06/7739   Procedure: HOLMIUM LASER APPLICATION left ureter ;  Surgeon: Raynelle Bring, MD;  Location: [facility];  Service: Urology;  Laterality: Left;  . KIDNEY STONE SURGERY    . KNEE SURGERY    . left elbow surgery    . PARATHYROIDECTOMY Right 05/03/2015   Procedure: RIGHT SUPERIOR PARATHYROIDECTOMY;  Surgeon: Armandina Gemma, MD;  Location: [facility];  Service: General;  Laterality: Right;  . REPLACEMENT TOTAL KNEE BILATERAL    . ROTATOR CUFF REPAIR    . TONSILLECTOMY    . TRIGGER FINGER RELEASE  2010   right  . VESICOVAGINAL FISTULA CLOSURE W/ TAH    . WRIST SURGERY  10/2011   right wrist    Family History  Problem Relation Age of Onset  . Stroke Mother   . Cancer Mother 73       bone cancer  . Colon cancer Neg Hx   . Esophageal cancer Neg Hx   . Rectal cancer Neg Hx   . Stomach cancer Neg Hx   . Diabetes Sister     Allergies  Allergen Reactions  . Erythromycin     REACTION: chest pain  Current Outpatient Prescriptions on File Prior to Visit  Medication Sig Dispense Refill  . b complex vitamins tablet Take 1 tablet by mouth daily.    . Cholecalciferol (VITAMIN D-3 PO) Take 2,000 Units by mouth.     Marland Kitchen ibuprofen (ADVIL,MOTRIN) 200 MG tablet Take 200 mg by mouth every 6 (six) hours as needed for moderate pain.     Marland Kitchen L-Methylfolate-Algae-B12-B6 (METANX) 3-90.314-2-35 MG CAPS Take one capsule by mouth two times daily 180 capsule 4  . lidocaine-prilocaine (EMLA) cream Apply four times per day as needed. 180 g 3  . lisinopril (PRINIVIL,ZESTRIL) 10 MG tablet TAKE ONE TABLET BY MOUTH ONCE DAILY 90 tablet 1  . Multiple Vitamin (MULTIVITAMIN) tablet Take 1 tablet by mouth daily.      . nortriptyline (PAMELOR) 25 MG capsule Take 3 capsules (75 mg total) by mouth at bedtime. 270 capsule 4  . Nutritional Supplements (ESTROVEN PO) Take 1 tablet by mouth daily.    . pregabalin (LYRICA) 100 MG capsule Take 1 capsule (100 mg  total) by mouth 2 (two) times daily. 180 capsule 1  . RESTASIS 0.05 % ophthalmic emulsion Place 1 drop into both eyes. Reported on 07/09/2015    . traMADol (ULTRAM) 50 MG tablet Take one to two tablets by mouth every 6 hours if needed for pain, Prescribed by Neurology 180 tablet 4  . [DISCONTINUED] gabapentin (NEURONTIN) 300 MG capsule Take 300 mg by mouth 3 (three) times daily.       No current facility-administered medications on file prior to visit.     BP 118/78   Pulse (!) 106   Temp 97.8 F (36.6 C) (Oral)   Ht 5\' 4"  (1.626 m)   Wt 252 lb 12.8 oz (114.7 kg)   SpO2 98%   BMI 43.39 kg/m     Review of Systems  Constitutional: Negative.   HENT: Negative for congestion, dental problem, hearing loss, rhinorrhea, sinus pressure, sore throat and tinnitus.   Eyes: Negative for pain, discharge and visual disturbance.  Respiratory: Negative for cough and shortness of breath.   Cardiovascular: Negative for chest pain, palpitations and leg swelling.  Gastrointestinal: Negative for abdominal distention, abdominal pain, blood in stool, constipation, diarrhea, nausea and vomiting.  Genitourinary: Negative for difficulty urinating, dysuria, flank pain, frequency, hematuria, pelvic pain, urgency, vaginal bleeding, vaginal discharge and vaginal pain.  Musculoskeletal: Negative for arthralgias, gait problem and joint swelling.  Skin: Negative for rash.  Neurological: Positive for numbness. Negative for dizziness, syncope, speech difficulty, weakness and headaches.  Hematological: Negative for adenopathy.  Psychiatric/Behavioral: Negative for agitation, behavioral problems and dysphoric mood. The patient is not nervous/anxious.        Objective:   Physical Exam  Constitutional: She is oriented to person, place, and time. She appears well-developed and well-nourished.  Blood pressure 116/76 Overweight Wearing gloves  HENT:  Head: Normocephalic.  Right Ear: External ear normal.  Left Ear:  External ear normal.  Mouth/Throat: Oropharynx is clear and moist.  Eyes: Conjunctivae and EOM are normal. Pupils are equal, round, and reactive to light.  Neck: Normal range of motion. Neck supple. No thyromegaly present.  Cardiovascular: Normal rate, regular rhythm, normal heart sounds and intact distal pulses.   Pulmonary/Chest: Effort normal and breath sounds normal.  Abdominal: Soft. Bowel sounds are normal. She exhibits no mass. There is no tenderness.  Musculoskeletal: Normal range of motion.  Lymphadenopathy:    She has no cervical adenopathy.  Neurological: She is alert and oriented to person, place,  and time.  Skin: Skin is warm and dry. No rash noted.  Psychiatric: She has a normal mood and affect. Her behavior is normal.          Assessment & Plan:   Essential hypertension Peripheral neuropathy Menopausal syndrome  Medications updated  Follow-up 6 months or as needed  Nyoka Cowden

## 2016-09-21 NOTE — Patient Instructions (Signed)
Limit your sodium (Salt) intake  Please check your blood pressure on a regular basis.  If it is consistently greater than 150/90, please make an office appointment.  Return in 6 months for follow-up   

## 2016-09-28 ENCOUNTER — Ambulatory Visit (INDEPENDENT_AMBULATORY_CARE_PROVIDER_SITE_OTHER): Payer: Medicare Other | Admitting: Orthopaedic Surgery

## 2016-09-28 DIAGNOSIS — M65321 Trigger finger, right index finger: Secondary | ICD-10-CM | POA: Diagnosis not present

## 2016-09-28 MED ORDER — METHYLPREDNISOLONE ACETATE 40 MG/ML IJ SUSP
40.0000 mg | INTRAMUSCULAR | Status: AC | PRN
  Administered 2016-09-28: 40 mg

## 2016-09-28 MED ORDER — LIDOCAINE HCL 1 % IJ SOLN
1.0000 mL | INTRAMUSCULAR | Status: AC | PRN
  Administered 2016-09-28: 1 mL

## 2016-09-28 NOTE — Progress Notes (Signed)
Office Visit Note   Patient: Katherine [name]           Date of Birth: 12/27/1950           MRN: [id number] Visit Date: 09/28/2016              Requested by: Marletta Lor, MD [place], [place] [address] PCP: Marletta Lor, MD   Assessment & Plan: Visit Diagnoses:  1. Trigger finger, right index finger     Plan: She tolerated the injection in her A1 pulley right index finger well. This is the second week done but is been a year since her first one. She's had trigger releases in the past but right now we will hold off unless it returns. She is doing too much with her neuropathy at this standpoint like to avoid type of surgeries.  Follow-Up Instructions: Return if symptoms worsen or fail to improve.   Orders:  No orders of the defined types were placed in this encounter.  No orders of the defined types were placed in this encounter.     Procedures: Hand/UE Inj Date/Time: 09/28/2016 3:20 PM Performed by: Mcarthur Rossetti Authorized by: Mcarthur Rossetti   Condition: trigger finger   Location:  Index finger Site:  R index A1 Medications:  1 mL lidocaine 1 %; 40 mg methylPREDNISolone acetate 40 MG/ML     Clinical Data: No additional findings.   Subjective: No chief complaint on file. The patient's well-known to me. It's been about a year since we saw her last. Today's chief complaint his right index finger pain and she points the A1 pulley with triggering. We've injected this area once before. She's had a history of peripheral neuropathy involving her feet now her hands. She is on multiple medications treating this neuropathy. She is not a diabetic. She's had A1 pulley release is on other hands in the past for right now she'll hold off on any surgery is requesting injection today.  HPI  Review of Systems She currently denies any headache, chest pain, nausea, vomiting, fever, chills, shortness of breath. She reports  neuropathy with numbness and tingling in her hands and feet.  Objective: Vital Signs: There were no vitals taken for this visit.  Physical Exam She is alert and awake 3 and in no acute distress Ortho Exam Examination of her right hand she is well-perfused fingers. Her grip strength and impingement strength are weak. I can easily palpate radial and ulnar pulses. She has subjective numbness of her entire hand. She has a well-healed incision from previous carpal tunnel surgery. She does have pain over the A1 pulley of the index finger but no triggering. Specialty Comments:  No specialty comments available.  Imaging: No results found.   PMFS History: Patient Active Problem List   Diagnosis Date Noted  . Trigger finger, right index finger 09/28/2016  . Impaired glucose tolerance 09/09/2015  . Hyperparathyroidism ([place]) 05/26/2013  . Hereditary and idiopathic peripheral neuropathy 12/29/2012  . Obese   . Menopausal symptoms 11/10/2011  . DIVERTICULOSIS, COLON 11/02/2007  . Dyslipidemia 03/09/2006  . Essential hypertension 03/09/2006  . NEPHROLITHIASIS, HX OF 03/09/2006   Past Medical History:  Diagnosis Date  . Arthritis    osteo  . Bacterial infection   . Chronic kidney disease    kidney stones  . Diverticulosis of colon   . Elevated LFTs    neg for hep b and c in 2006  . Family history of adverse reaction  to anesthesia    mother with sleep apnea  . History of mumps   . Hyperlipidemia   . Hypertension   . Kidney infection   . Kidney stones   . Neuropathy    hands and feet  . Pelvic pain in female 10/27/10  . Trichomonas   . Yeast infection     Family History  Problem Relation Age of Onset  . Stroke Mother   . Cancer Mother 59       bone cancer  . Colon cancer Neg Hx   . Esophageal cancer Neg Hx   . Rectal cancer Neg Hx   . Stomach cancer Neg Hx   . Diabetes Sister     Past Surgical History:  Procedure Laterality Date  . ABDOMINAL HYSTERECTOMY    . CARPAL  TUNNEL RELEASE    . CYSTOSCOPY WITH RETROGRADE PYELOGRAM, URETEROSCOPY AND STENT PLACEMENT Left 12/18/2014   Procedure: CYSTOSCOPY WITH RETROGRADE PYELOGRAM, URETEROSCOPY AND STENT PLACEMENT left ureter;  Surgeon: Raynelle Bring, MD;  Location: [facility];  Service: Urology;  Laterality: Left;  . DILATION AND CURETTAGE OF UTERUS    . HAND SURGERY  06/2011   2 surgeries on left hand  . HOLMIUM LASER APPLICATION Left 4/0/9811   Procedure: HOLMIUM LASER APPLICATION left ureter ;  Surgeon: Raynelle Bring, MD;  Location: [facility];  Service: Urology;  Laterality: Left;  . KIDNEY STONE SURGERY    . KNEE SURGERY    . left elbow surgery    . PARATHYROIDECTOMY Right 05/03/2015   Procedure: RIGHT SUPERIOR PARATHYROIDECTOMY;  Surgeon: Armandina Gemma, MD;  Location: [facility];  Service: General;  Laterality: Right;  . REPLACEMENT TOTAL KNEE BILATERAL    . ROTATOR CUFF REPAIR    . TONSILLECTOMY    . TRIGGER FINGER RELEASE  2010   right  . VESICOVAGINAL FISTULA CLOSURE W/ TAH    . WRIST SURGERY  10/2011   right wrist   Social History   Occupational History  .  [employer]   Social History Main Topics  . Smoking status: Never Smoker  . Smokeless tobacco: Never Used  . Alcohol use No  . Drug use: No  . Sexual activity: No

## 2016-10-07 ENCOUNTER — Telehealth: Payer: Self-pay | Admitting: Internal Medicine

## 2016-10-07 MED ORDER — LIDOCAINE-PRILOCAINE 2.5-2.5 % EX CREA: TOPICAL_CREAM | CUTANEOUS | 3 refills | Status: DC

## 2016-10-07 NOTE — Telephone Encounter (Signed)
Rx refilled.

## 2016-10-07 NOTE — Telephone Encounter (Signed)
Dr Raliegh Ip filled pt's rx lidocaine-prilocaine (EMLA) cream  for the first time.  Pt was supposed to get 3 tubes / month which is 270 g (30 grams ea /3 mo supply)  The rx was sent in 300 g   [facility] needs [place] to send in a new rx    3 /30 g tubes /mo   3 mo supply  270 g

## 2016-10-19 LAB — HM MAMMOGRAPHY

## 2016-10-20 ENCOUNTER — Encounter: Payer: Self-pay | Admitting: Internal Medicine

## 2016-11-09 ENCOUNTER — Telehealth: Payer: Self-pay | Admitting: Internal Medicine

## 2016-11-09 NOTE — Telephone Encounter (Signed)
Patient informed that we have not yet received any paperwork

## 2016-11-09 NOTE — Telephone Encounter (Signed)
Notify patient that I had not received any paperwork as of yet

## 2016-11-09 NOTE — Telephone Encounter (Signed)
Please advise 

## 2016-11-09 NOTE — Telephone Encounter (Signed)
° ° ° °  pt call to ask Dr Raliegh Ip received some paperwork from Dr Leo Grosser about some medicines that may inter act with something she already t akes. Woul like a call back  [phone]

## 2016-11-23 ENCOUNTER — Encounter: Payer: Self-pay | Admitting: Internal Medicine

## 2016-11-23 ENCOUNTER — Ambulatory Visit (INDEPENDENT_AMBULATORY_CARE_PROVIDER_SITE_OTHER): Payer: Medicare Other | Admitting: Internal Medicine

## 2016-11-23 VITALS — BP 146/78 | HR 108 | Temp 98.1°F | Ht 64.0 in | Wt 252.4 lb

## 2016-11-23 DIAGNOSIS — I10 Essential (primary) hypertension: Secondary | ICD-10-CM

## 2016-11-23 DIAGNOSIS — E785 Hyperlipidemia, unspecified: Secondary | ICD-10-CM | POA: Diagnosis not present

## 2016-11-23 DIAGNOSIS — R7302 Impaired glucose tolerance (oral): Secondary | ICD-10-CM

## 2016-11-23 DIAGNOSIS — E213 Hyperparathyroidism, unspecified: Secondary | ICD-10-CM

## 2016-11-23 DIAGNOSIS — Z0000 Encounter for general adult medical examination without abnormal findings: Secondary | ICD-10-CM

## 2016-11-23 MED ORDER — NORTRIPTYLINE HCL 25 MG PO CAPS: 75.0000 mg | ORAL_CAPSULE | Freq: Every day | ORAL | 4 refills | Status: DC

## 2016-11-23 MED ORDER — METANX 3-90.314-2-35 MG PO CAPS: ORAL_CAPSULE | ORAL | 4 refills | Status: DC

## 2016-11-23 MED ORDER — LISINOPRIL 10 MG PO TABS: 10.0000 mg | ORAL_TABLET | Freq: Every day | ORAL | 1 refills | Status: DC

## 2016-11-23 NOTE — Progress Notes (Signed)
Subjective:    Patient ID: Katherine [name], female    DOB: Oct 21, 1950, 66 y.o.   MRN: [id number]  HPI  66 year old patient who is seen today for an annual exam and Medicare wellness visit.  She has had a recent gynecologic exam and mammogram She has a history of essential hypertension. She has remote history of hyperparathyroidism and is status post surgery.  She has dyslipidemia and a history of impaired glucose tolerance. Her chief problem is a severe peripheral neuropathy.  She has vasomotor symptoms and the patient wonders about the use of Effexor.  The patient presently is on nortriptyline as well as tramadol and she was told that Effexor would increase her risk of serious issues (, serotonin syndrome).  Past Medical History:  Diagnosis Date  . Arthritis    osteo  . Bacterial infection   . Chronic kidney disease    kidney stones  . Diverticulosis of colon   . Elevated LFTs    neg for hep b and c in 2006  . Family history of adverse reaction to anesthesia    mother with sleep apnea  . History of mumps   . Hyperlipidemia   . Hypertension   . Kidney infection   . Kidney stones   . Neuropathy    hands and feet  . Pelvic pain in female 10/27/10  . Trichomonas   . Yeast infection      Social History   Social History  . Marital status: Single    Spouse name: N/A  . Number of children: 1  . 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 Smoking status: Never Smoker  . Smokeless tobacco: Never Used  . Alcohol use No  . Drug use: No  . Sexual activity: No   Other Topics Concern  . Not on file   Social History Narrative   Patient is single, has 1 child   Patient is right handed   Caffeine consumption is 0    Past Surgical History:  Procedure Laterality Date  . ABDOMINAL HYSTERECTOMY    . CARPAL TUNNEL RELEASE    . CYSTOSCOPY WITH RETROGRADE PYELOGRAM, URETEROSCOPY AND STENT PLACEMENT Left 12/18/2014   Procedure: CYSTOSCOPY WITH RETROGRADE PYELOGRAM, URETEROSCOPY AND STENT PLACEMENT left ureter;  Surgeon: Raynelle Bring, MD;  Location[facility];  Service: Urology;  Laterality: Left;  . DILATION AND CURETTAGE OF UTERUS    . HAND SURGERY  06/2011   2 surgeries on left hand  . HOLMIUM LASER APPLICATION Left 05/19/7586   Procedure: HOLMIUM LASER APPLICATION left ureter ;  Surgeon: Raynelle Bring, MD;  Location: [facility];  Service: Urology;  Laterality: Left;  . KIDNEY STONE SURGERY    . KNEE SURGERY    . left elbow surgery    . PARATHYROIDECTOMY Right 05/03/2015   Procedure: RIGHT SUPERIOR PARATHYROIDECTOMY;  Surgeon: Armandina Gemma, MD;  Location: [facility];  Service: General;  Laterality: Right;  . REPLACEMENT TOTAL KNEE BILATERAL    . ROTATOR CUFF REPAIR    . TONSILLECTOMY    . TRIGGER FINGER RELEASE  2010   right  . VESICOVAGINAL FISTULA CLOSURE W/ TAH    . WRIST SURGERY  10/2011   right wrist    Family History  Problem Relation Age of Onset  . Stroke Mother   . Cancer Mother 68       bone cancer  . Colon cancer Neg Hx   .  Esophageal cancer Neg Hx   . Rectal cancer Neg Hx   . Stomach cancer Neg Hx   . Diabetes Sister     Allergies  Allergen Reactions  . Erythromycin     REACTION: chest pain    Current Outpatient Prescriptions on File Prior to Visit  Medication Sig Dispense Refill  . b complex vitamins tablet Take 1 tablet by mouth daily.    . Cholecalciferol (VITAMIN D-3 PO) Take 2,000 Units by mouth.     Marland Kitchen ibuprofen (ADVIL,MOTRIN) 200 MG tablet Take 200 mg by mouth every 6 (six) hours as needed for moderate pain.     Marland Kitchen lidocaine-prilocaine (EMLA) cream Apply four times per day as needed. 270 g 3  . Multiple Vitamin (MULTIVITAMIN) tablet Take 1 tablet by mouth daily.      . Nutritional Supplements (ESTROVEN PO) Take 1 tablet by mouth daily.    . pregabalin (LYRICA) 100 MG capsule Take 1 capsule (100 mg total) by mouth 2 (two) times daily. 180 capsule 1  . RESTASIS 0.05 % ophthalmic  emulsion Place 1 drop into both eyes. Reported on 07/09/2015    . traMADol (ULTRAM) 50 MG tablet Take one to two tablets by mouth every 6 hours if needed for pain, Prescribed by Neurology 180 tablet 4  . [DISCONTINUED] gabapentin (NEURONTIN) 300 MG capsule Take 300 mg by mouth 3 (three) times daily.       No current facility-administered medications on file prior to visit.     BP (!) 146/78 (BP Location: Left Arm, Patient Position: Sitting, Cuff Size: Normal)   Pulse (!) 108   Temp 98.1 F (36.7 C) (Oral)   Ht 5\' 4"  (1.626 m)   Wt 252 lb 6.4 oz (114.5 kg)   SpO2 95%   BMI 43.32 kg/m   Medicare wellness visit  1. Risk factors, based on past  M,S,F history.  Cardiovascular risk factors include hypertension and dyslipidemia  2.  Physical activities: participates in water aerobics 3 times weekly.  Limited due to peripheral neuropathy 3.  Depression/mood:no history of major depression or mood disorder  4.  Hearing:no deficits  5.  ADL's:independent  6.  Fall risk:moderate due to peripheral neuropathy  7.  Home safety:no problems identified  8.  Height weight, and visual acuity;height and weight stable no change in visual acuity is seen by ophthalmology annually.  Last visit February 2018  9.  Counseling:continue heart healthy diet and efforts at weight loss  10. Lab orders based on risk factors:laboratory studies reviewed  11. Referral :none appropriate at this time.  Patient is followed by a number of consultants  12. Care plan:continue efforts at aggressive risk factor modification  13. Cognitive assessment: alert and oriented with normal affect no cognitive dysfunction  14. Screening: Patient provided with a written and personalized 5-10 year screening schedule in the AVS.    15. Provider List Update: primary care dental medicine, orthopedics gynecology ophthalmology, urology, as well as neurology    Review of Systems  Constitutional: Negative.   HENT: Negative for  congestion, dental problem, hearing loss, rhinorrhea, sinus pressure, sore throat and tinnitus.   Eyes: Negative for pain, discharge and visual disturbance.  Respiratory: Negative for cough and shortness of breath.   Cardiovascular: Negative for chest pain, palpitations and leg swelling.  Gastrointestinal: Negative for abdominal distention, abdominal pain, blood in stool, constipation, diarrhea, nausea and vomiting.  Genitourinary: Negative for difficulty urinating, dysuria, flank pain, frequency, hematuria, pelvic pain, urgency, vaginal bleeding, vaginal discharge  and vaginal pain.  Musculoskeletal: Negative for arthralgias, gait problem and joint swelling.  Skin: Negative for rash.  Neurological: Positive for numbness. Negative for dizziness, syncope, speech difficulty, weakness and headaches.       Painful dysesthesias of hands and lower extremities  Hematological: Negative for adenopathy.  Psychiatric/Behavioral: Negative for agitation, behavioral problems and dysphoric mood. The patient is not nervous/anxious.        Objective:   Physical Exam  Constitutional: She is oriented to person, place, and time. She appears well-developed and well-nourished.  Weight 252  HENT:  Head: Normocephalic.  Right Ear: External ear normal.  Left Ear: External ear normal.  Mouth/Throat: Oropharynx is clear and moist.  Eyes: Pupils are equal, round, and reactive to light. Conjunctivae and EOM are normal.  Neck: Normal range of motion. Neck supple. No thyromegaly present.  Cardiovascular: Normal rate, regular rhythm, normal heart sounds and intact distal pulses.   Pedal pulses revealed full dorsalis pedis pulse and faint.  Posterior tibial pulses  Pulmonary/Chest: Effort normal and breath sounds normal.  Abdominal: Soft. Bowel sounds are normal. She exhibits no mass. There is no tenderness.  Musculoskeletal: Normal range of motion.  Status post bilateral total knee replacement surgeries    Lymphadenopathy:    She has no cervical adenopathy.  Neurological: She is alert and oriented to person, place, and time.  Marked decreased vibratory sensation and monofilament testing distally  Skin: Skin is warm and dry. No rash noted.  Wearing gloves  Psychiatric: She has a normal mood and affect. Her behavior is normal.          Assessment & Plan:   Preventive health examination Subsequent Medicare wellness visit Essential hypertension, stable Peripheral neuropathy History of impaired glucose tolerance Exogenous obesity Menopausal syndrome  No change in medical regimen Weight loss encouraged Patient was given instructions concerning herbal alternatives to treat her vasomotor symptoms  Return in 6 months for follow-up  Nyoka Cowden

## 2016-11-23 NOTE — Patient Instructions (Addendum)
Menopause and Herbal Products What is menopause? Menopause is the normal time of life when menstrual periods decrease in frequency and eventually stop completely. This process can take several years for some women. Menopause is complete when you have had an absence of menstruation for a full year since your last menstrual period. It usually occurs between the ages of 48 and 55. It is not common for menopause to begin before the age of 40. During menopause, your body stops producing the female hormones estrogen and progesterone. Common symptoms associated with this loss of hormones (vasomotor symptoms) are:  Hot flashes.  Hot flushes.  Night sweats.  Other common symptoms and complications of menopause include:  Decrease in sex drive.  Vaginal dryness and thinning of the walls of the vagina. This can make sex painful.  Dryness of the skin and development of wrinkles.  Headaches.  Tiredness.  Irritability.  Memory problems.  Weight gain.  Bladder infections.  Hair growth on the face and chest.  Inability to reproduce offspring (infertility).  Loss of density in the bones (osteoporosis) increasing your risk for breaks (fractures).  Depression.  Hardening and narrowing of the arteries (atherosclerosis). This increases your risk of heart attack and stroke.  What treatment options are available? There are many treatment choices for menopause symptoms. The most common treatment is hormone replacement therapy. Many alternative therapies for menopause are emerging, including the use of herbal products. These supplements can be found in the form of herbs, teas, oils, tinctures, and pills. Common herbal supplements for menopause are made from plants that contain phytoestrogens. Phytoestrogens are compounds that occur naturally in plants and plant products. They act like estrogen in the body. Foods and herbs that contain phytoestrogens include:  Soy.  Flax seeds.  Red  clover.  Ginseng.  What menopause symptoms may be helped if I use herbal products?  Vasomotor symptoms. These may be helped by: ? Soy. Some studies show that soy may have a moderate benefit for hot flashes. ? Black cohosh. There is limited evidence indicating this may be beneficial for hot flashes.  Symptoms that are related to heart and blood vessel disease. These may be helped by soy. Studies have shown that soy can help to lower cholesterol.  Depression. This may be helped by: ? St. John's wort. There is limited evidence that shows this may help mild to moderate depression. ? Black cohosh. There is evidence that this may help depression and mood swings.  Osteoporosis. Soy may help to decrease bone loss that is associated with menopause and may prevent osteoporosis. Limited evidence indicates that red clover may offer some bone loss protection as well. Other herbal products that are commonly used during menopause lack enough evidence to support their use as a replacement for conventional menopause therapies. These products include evening primrose, ginseng, and red clover. What are the cases when herbal products should not be used during menopause? Do not use herbal products during menopause without your health care provider's approval if:  You are taking medicine.  You have a preexisting liver condition.  Are there any risks in my taking herbal products during menopause? If you choose to use herbal products to help with symptoms of menopause, keep in mind that:  Different supplements have different and unmeasured amounts of herbal ingredients.  Herbal products are not regulated the same way that medicines are.  Concentrations of herbs may vary depending on the way they are prepared. For example, the concentration may be different in a pill,   tea, oil, and tincture.  Little is known about the risks of using herbal products, particularly the risks of long-term use.  Some herbal  supplements can be harmful when combined with certain medicines.  Most commonly reported side effects of herbal products are mild. However, if used improperly, many herbal supplements can cause serious problems. Talk to your health care provider before starting any herbal product. If problems develop, stop taking the supplement and let your health care provider know. This information is not intended to replace advice given to you by your health care provider. Make sure you discuss any questions you have with your health care provider. Document Released: 10/14/2007 Document Revised: 03/24/2016 Document Reviewed: 10/10/2013 Elsevier Interactive Patient Education  2017 [place] your sodium (Salt) intake  Please check your blood pressure on a regular basis.  If it is consistently greater than 150/90, please make an office appointment.    It is important that you exercise regularly, at least 20 minutes 3 to 4 times per week.  If you develop chest pain or shortness of breath seek  medical attention.  You need to lose weight.  Consider a lower calorie diet and regular exercise.

## 2017-01-31 IMAGING — MR MR SHOULDER*R* W/O CM
4 of 5 series · 26 of 40 positions shown · non-contrast
Comparison: None.

CLINICAL DATA: Severe right shoulder pain and decreased range of
motion with weakness and swelling. Previous rotator cuff surgery.

EXAM:
MRI OF THE RIGHT SHOULDER WITHOUT CONTRAST
TECHNIQUE: Multiplanar, multisequence MR imaging of the shoulder was performed.
No intravenous contrast was administered.

[Series 3: T2 fat-sat · axial · 4.0mm · 0.55mm/px · z∈[-30,+52]mm · 8 of 19 slices shown (1 of 3)]
[im 1/19]
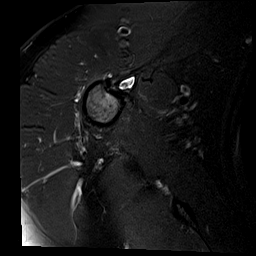
[im 3/19]
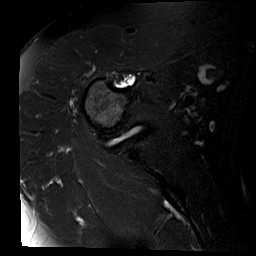
[im 6/19]
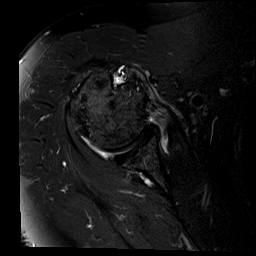
[im 8/19]
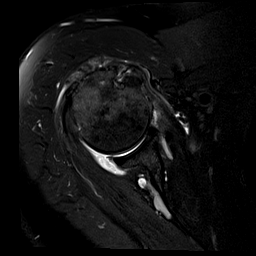
[im 11/19]
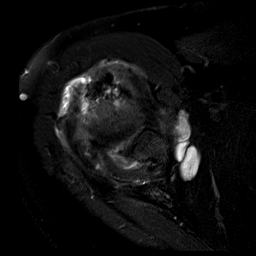
[im 13/19]
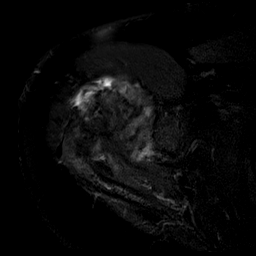
[im 16/19]
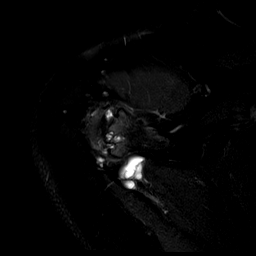
[im 19/19]
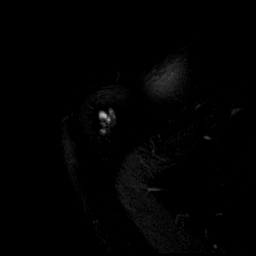

[Series 4: T2 fat-sat · oblique · 4.0mm · 0.29mm/px · 7 of 18 slices shown (2 of 3)]
[im 1/18]
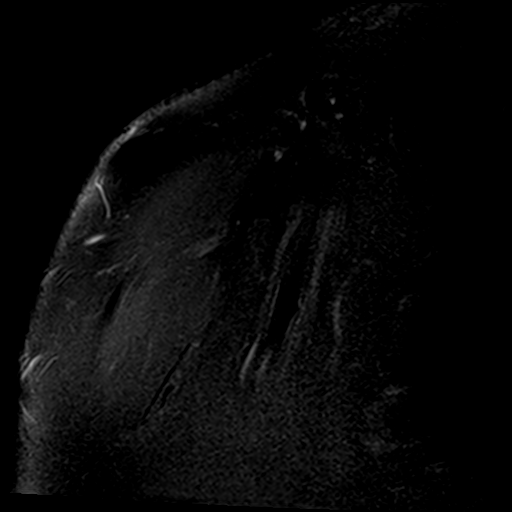
[im 3/18]
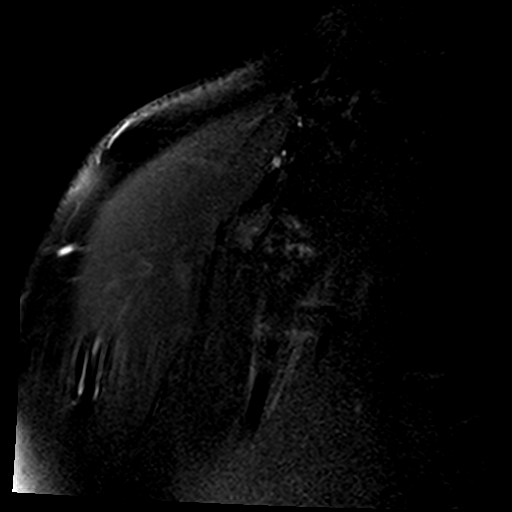
[im 5/18]
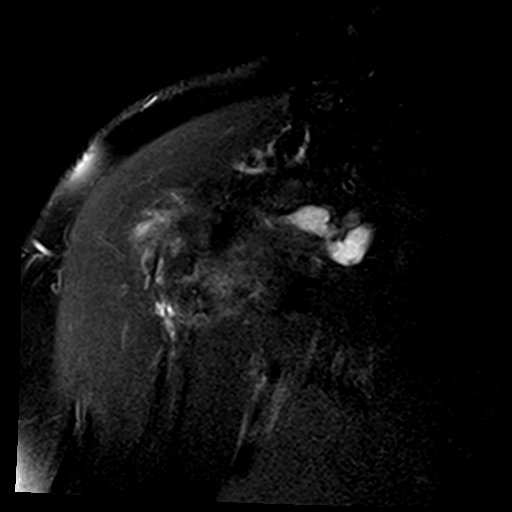
[im 8/18]
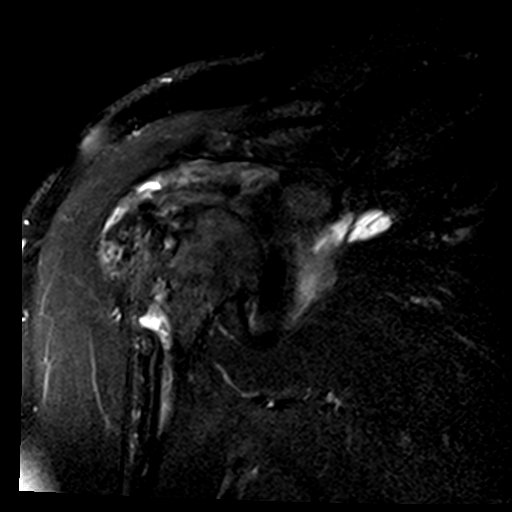
[im 10/18]
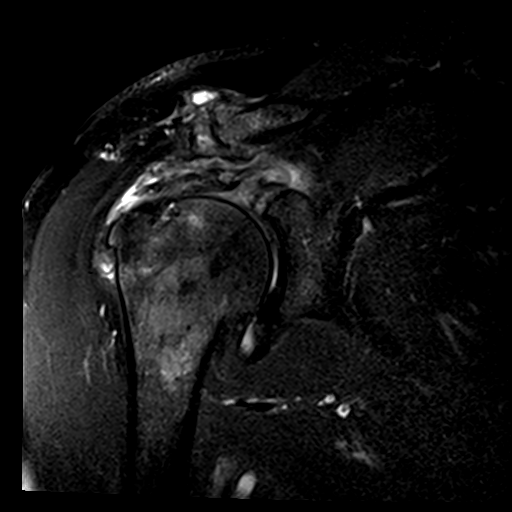
[im 13/18]
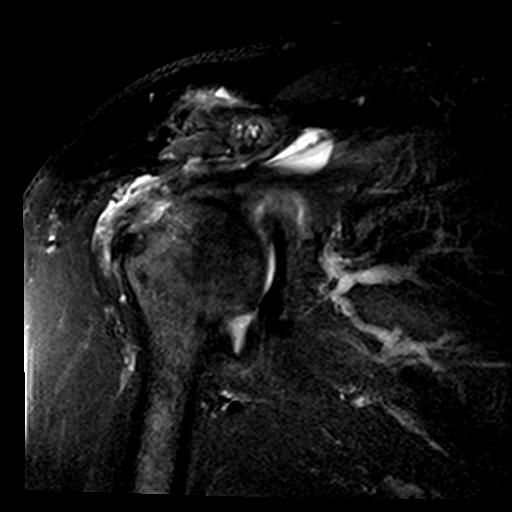
[im 15/18]
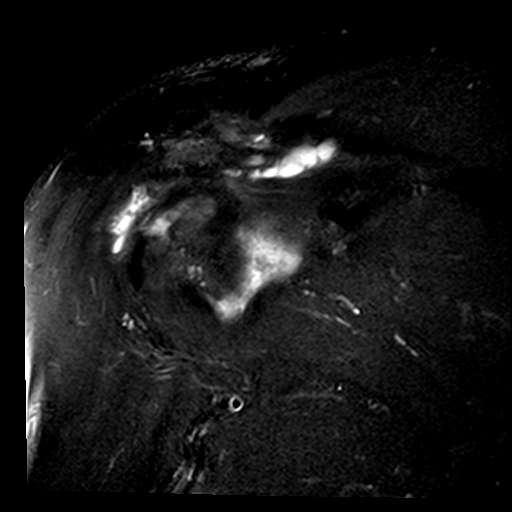

[Series 5: T2 fat-sat · coronal · 4.0mm · 0.59mm/px · 3 of 17 slices shown (3 of 3)]
[im 3/17]
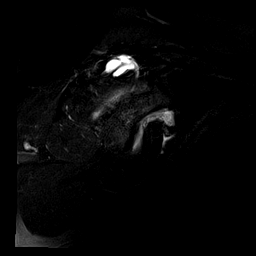
[im 10/17]
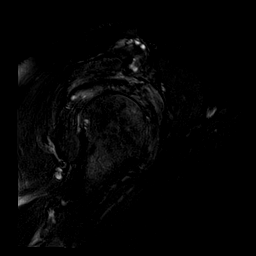
[im 14/17]
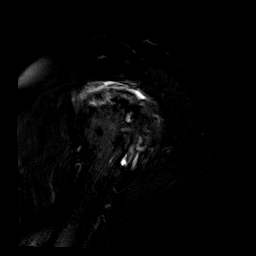

[Series 7: PD · oblique · 4.0mm · 0.29mm/px · 8 of 18 slices shown]
[im 1/18]
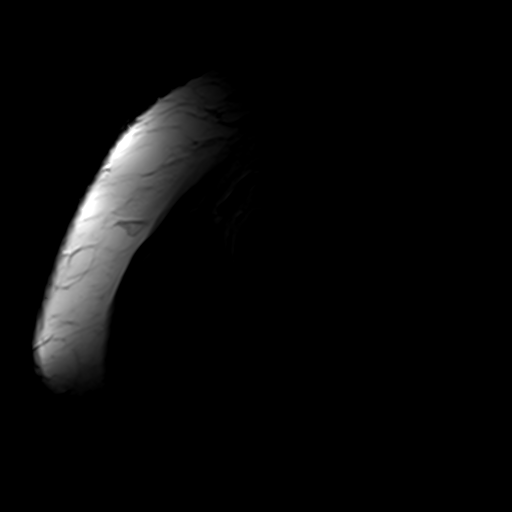
[im 3/18]
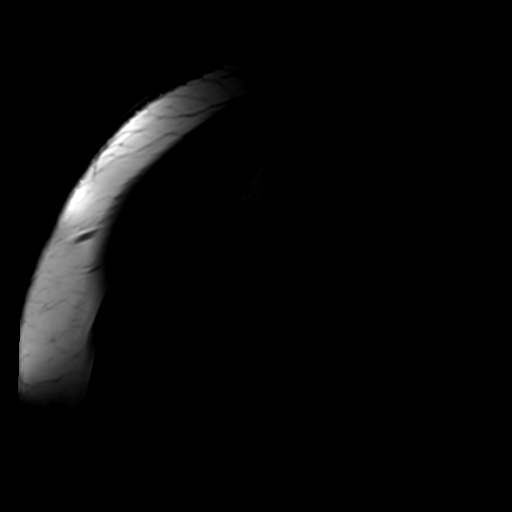
[im 5/18]
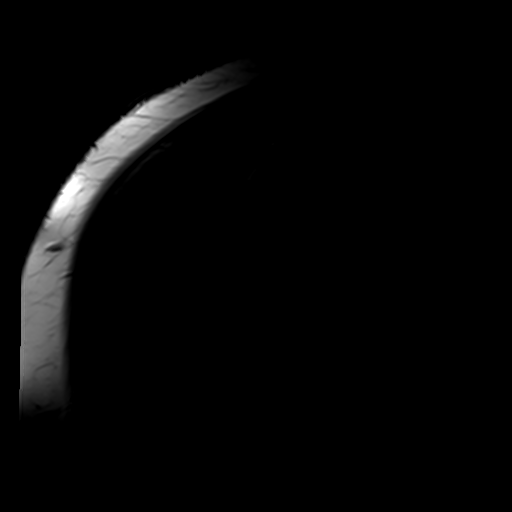
[im 8/18]
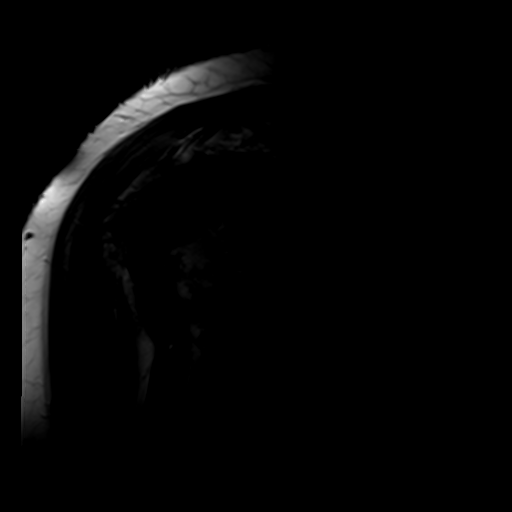
[im 10/18]
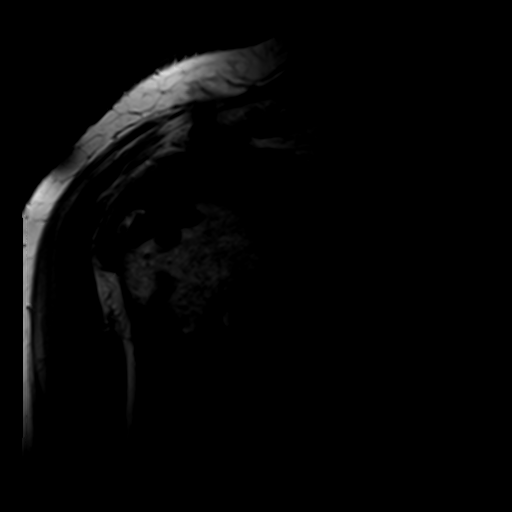
[im 13/18]
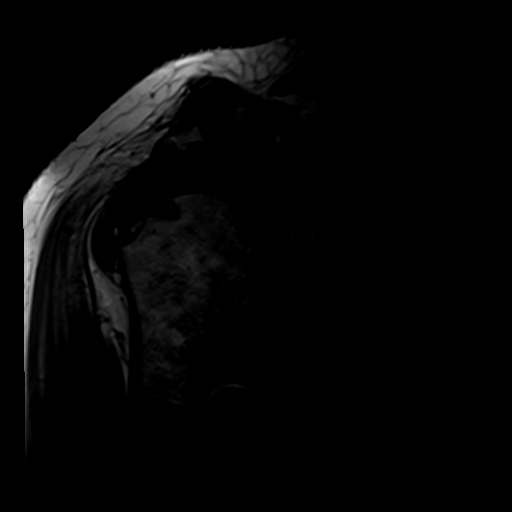
[im 15/18]
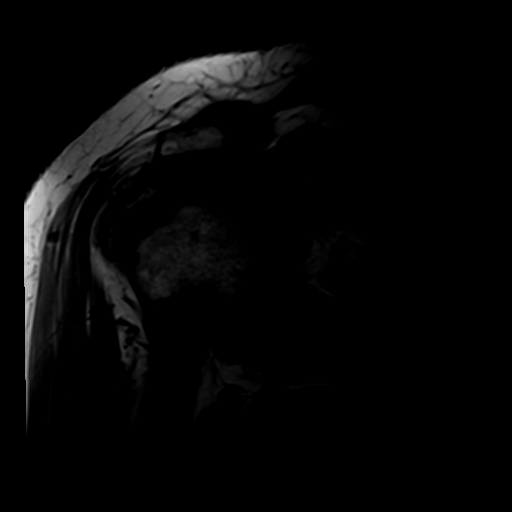
[im 18/18]
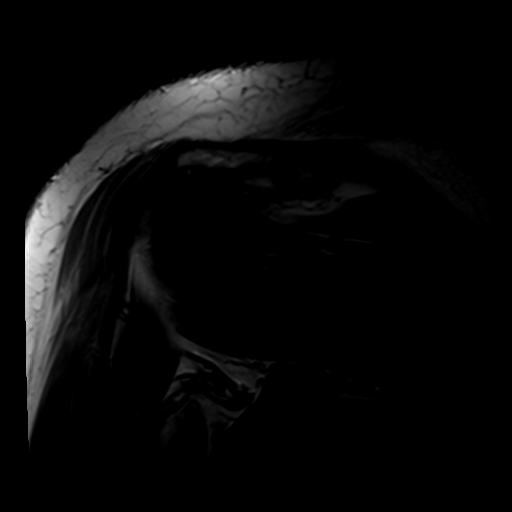

[26 of 40 positions shown; findings below may reference images not displayed]

FINDINGS: Rotator cuff: There is diffuse severe degeneration of the
infraspinatus and supraspinous and subscapularis tendons with high
hypertrophy and inhomogeneity in multiple partial-thickness bursal
surface tear is of the infraspinatus and supraspinous. There is
abnormal soft tissue in the rotator cuff interval and around the
cortical humeral ligament. There is a longitudinal intrasubstance
tear of the musculotendinous junction of the infraspinatus.

Muscles: Moderate atrophy of infraspinatus, supraspinous and
subscapularis muscles there

Biceps long head: The tendon is disrupted and distally retracted.
There is extensive debris in the bicipital tendon sheath.

Acromioclavicular Joint: Severe AC joint arthropathy with type 3
acromion and inferior osteophytes on the distal clavicle, all of
which could predispose to impingement. Fluid and debris in the
subacromial/subdeltoid bursae.

Glenohumeral Joint: The articular surfaces appear normal. Moderate
glenohumeral joint effusion.

Labrum:  Superior labrum is poorly defined diffusely degenerated.

Bones: Extensive degenerative changes of the greater and lesser
tuberosities of the humerus.
IMPRESSION: 1. Severe degeneration of the rotator cuff with multiple bursal
surface deep partial-thickness tears.
2. Subacromial/subdeltoid bursitis.
3. AC joint arthropathy which could predispose to impingement.
4. Complete rupture of the long head of the biceps tendon with
distal retraction.
5. Glenohumeral joint effusion with debris in the joint.

## 2017-02-08 ENCOUNTER — Telehealth: Payer: Self-pay | Admitting: *Deleted

## 2017-02-08 NOTE — Telephone Encounter (Signed)
Patient called stating the b vitamin combination was sent to the wrong pharmacy, patient need this to go to [facility]. [address] [place] [address] ph# [phone]. Please advise [phone]

## 2017-02-09 MED ORDER — METANX 3-90.314-2-35 MG PO CAPS: ORAL_CAPSULE | ORAL | 4 refills | Status: DC

## 2017-02-09 NOTE — Telephone Encounter (Signed)
Rx refill was sent to  [facility].

## 2017-02-26 ENCOUNTER — Other Ambulatory Visit: Payer: Self-pay | Admitting: *Deleted

## 2017-02-26 MED ORDER — PREGABALIN 100 MG PO CAPS: 100.0000 mg | ORAL_CAPSULE | Freq: Two times a day (BID) | ORAL | 0 refills | Status: DC

## 2017-03-03 ENCOUNTER — Encounter (HOSPITAL_COMMUNITY): Payer: Self-pay | Admitting: General Practice

## 2017-03-03 ENCOUNTER — Other Ambulatory Visit: Payer: Self-pay | Admitting: Urology

## 2017-03-05 NOTE — H&P (Signed)
Office Visit Report     03/03/2017   --------------------------------------------------------------------------------   Katherine [name]  MRN: [id number]  PRIMARY CARE:  Bluford Kaufmann, MD  DOB: [birth year], 66 year old Female  REFERRING:  Bluford Kaufmann, MD  SSN:   PROVIDER:  Raynelle Bring, M.D.    TREATING:  Jimmey Ralph    LOCATION:  [facility] [phone]   --------------------------------------------------------------------------------   CC: I have ureteral stone.  HPI: Katherine [name] is a 66 year-old female established patient who is here for ureteral stone.  States she has only had 1 day of hematuria and pain since last visit.    The problem is on the left side. She first stated noticing pain on 02/12/2017. She is not currently having flank pain, back pain, groin pain, nausea, vomiting, fever or chills. She has not caught a stone in her urine strainer since her symptoms began.     ALLERGIES: Erythromycin Derivatives - Other Reaction, Chest pain    MEDICATIONS: Lisinopril 10 mg tablet Oral  Lyrica 50 mg capsule Oral  Metanx 2 mg-3 mg-35 mg tablet Oral  Multivitamins tablet Oral  Nortriptyline Hcl 50 mg capsule Oral  Tramadol Hcl 50 mg tablet Oral  Vitamin B12 1000 MCG Oral Tablet Extended Release Oral  Vitamin D-3 5000 UNIT TABS Oral     GU PSH: Cysto Uretero Lithotripsy - 2016 Cystoscopy Insert Stent - 2016 D&C Non-OB - 2016 Hysterectomy Unilat SO - 2016    NON-GU PSH: Carpal tunnel surgery - 2016 Hand/finger Surgery Remove Tonsils - 2016 Revise Knee Joint - 2016 Rotator cuff surgery - 2016 Wrist Arthroscopy/surgery    GU PMH: Gross hematuria (Acute), Culture urine. No ABX unless culture proven UTI. Secondary to proximal left ureteral calculus. - 02/12/2017, Gross hematuria, - 03/26/2015 Urinary Calculus, Unspec, Urolithiasis - 2017 Ureteral calculus, Ureteral stone - 2016 History of urolithiasis, History of nephrolithiasis -  2016    NON-GU PMH: Encounter for general adult medical examination without abnormal findings, Encounter for preventive health examination - 2016 Hyperlipidemia, unspecified, Dyslipidemia - 2016 Personal history of other diseases of the circulatory system, History of hypertension - 2016 Personal history of other diseases of the digestive system, History of diverticulitis of colon - 2016 Personal history of other diseases of the nervous system and sense organs, History of neuropathy - 2016, History of peripheral neuropathy, - 2016 Personal history of other endocrine, nutritional and metabolic disease, History of hyperparathyroidism - 2016    FAMILY HISTORY: Asthma - Runs In Family Bone Cancer - Runs In Family Brain tumor - Runs In Family Chronic Obstructive Pulmonary Disease - Runs In Family Death - Mother, Father malignant neoplasm of uterus - Runs In Family   SOCIAL HISTORY: Marital Status: Single Preferred Language: English; Ethnicity: Not Hispanic Or Latino; Race: Black or African American Current Smoking Status: Patient has never smoked.   Tobacco Use Assessment Completed: Used Tobacco in last 30 days? Does not use smokeless tobacco. Has never drank.  Does not use drugs. Does not drink caffeine. Patient's occupation is/was Retired.    REVIEW OF SYSTEMS:    GU Review Female:   Patient reports frequent urination, get up at night to urinate, and leakage of urine. Patient denies hard to postpone urination, burning /pain with urination, stream starts and stops, trouble starting your stream, have to strain to urinate, and being pregnant.  Gastrointestinal (Upper):   Patient reports indigestion/ heartburn. Patient denies nausea and vomiting.  Gastrointestinal (Lower):   Patient denies diarrhea and  constipation.  Constitutional:   Patient reports night sweats. Patient denies fever, weight loss, and fatigue.  Skin:   Patient denies skin rash/ lesion and itching.  Eyes:   Patient denies  blurred vision and double vision.  Ears/ Nose/ Throat:   Patient denies sore throat and sinus problems.  Hematologic/Lymphatic:   Patient denies swollen glands and easy bruising.  Cardiovascular:   Patient reports leg swelling. Patient denies chest pains.  Respiratory:   Patient denies cough and shortness of breath.  Endocrine:   Patient denies excessive thirst.  Musculoskeletal:   Patient reports back pain and joint pain.   Neurological:   Patient denies headaches and dizziness.  Psychologic:   Patient denies depression and anxiety.   VITAL SIGNS:      03/03/2017 08:20 AM  Weight 248 lb / 112.49 kg  Height 64 in / 162.56 cm  BP 152/95 mmHg  Pulse 111 /min  Temperature 98.3 F / 36.8 C  BMI 42.6 kg/m   MULTI-SYSTEM PHYSICAL EXAMINATION:    Constitutional: Obese. No physical deformities. Normally developed. Good grooming.   Respiratory: No labored breathing, no use of accessory muscles.   Cardiovascular: Normal temperature, normal extremity pulses, no swelling, no varicosities.   Skin: No paleness, no jaundice, no cyanosis. No lesion, no ulcer, no rash.   Neurologic / Psychiatric: Oriented to time, oriented to place, oriented to person. No depression, no anxiety, no agitation.   Gastrointestinal: Obese abdomen. No mass, no tenderness, no rigidity. No CVAT  Musculoskeletal: Normal gait and station of head and neck.     PAST DATA REVIEWED:  Source Of History:  Patient  Records Review:   Previous Patient Records  Urine Test Review:   Urinalysis  X-Ray Review: C.T. Stone Protocol: Reviewed Films. Reviewed Report. 02/12/17 IMPRESSION: 1. 7 by 4 by 4 mm stone in the left proximal ureter associated with mild proximal left hydroureter but no overt hydronephrosis. 2. There is also a 6 mm nonobstructive left kidney lower pole calculus. 3. Lumbar spondylosis and degenerative disc disease causing multilevel impingement. 4. Gallstones.     PROCEDURES:         KUB - [id number]  A single view of the  abdomen is obtained.      No significant change in left proximal ureteral stone. Still located at L3.          Urinalysis w/Scope - 81001 Dipstick Dipstick Cont'd Micro  Specimen: Voided Bilirubin: Neg WBC/hpf: 0 - 5/hpf  Color: Yellow Ketones: Neg RBC/hpf: 20 - 40/hpf  Appearance: Clear Blood: 3+ Bacteria: Few (10-25/hpf)  Specific Gravity: 1.015 Protein: Neg Cystals: Ca Oxalate  pH: 7.0 Urobilinogen: 0.2 Casts: NS (Not Seen)  Glucose: Neg Nitrites: Neg Trichomonas: Not Present    Leukocyte Esterase: Neg Mucous: Not Present      Epithelial Cells: 0 - 5/hpf      Yeast: NS (Not Seen)      Sperm: Not Present    ASSESSMENT:      ICD-10 Details  1 GU:   Ureteral calculus - N20.1 Left, Stable, Chronic - No change in left proximal ureteral stone. Recommend she proceed with elective ESWL. Culture urine. No ABX unless culture proven UTI.    PLAN:           Orders Labs Urine Culture          Document Letter(s):  Created for Patient: Clinical Summary         Notes:   I went over the procedure of  extracorporal shockwave lithotripsy with the patient in quite detail. She understands the risk of poor fragmentation resulting in further ureteral intervention. She also understands that possibility of needing additional procedures to clear stones. Finally, we discussed the risks of hematoma. The patient is willing to proceed.    * Signed by Jimmey Ralph on 03/03/17 at 8:33 AM (EDT)*

## 2017-03-08 ENCOUNTER — Encounter (HOSPITAL_COMMUNITY): Payer: Self-pay | Admitting: *Deleted

## 2017-03-08 ENCOUNTER — Encounter (HOSPITAL_COMMUNITY)
Admission: RE | Disposition: A | Discharge status: Home / Self Care | Payer: Self-pay | Source: Ambulatory Visit | Attending: Urology

## 2017-03-08 ENCOUNTER — Ambulatory Visit (HOSPITAL_COMMUNITY)
Admission: RE | Admit: 2017-03-08 | Discharge: 2017-03-08 | Disposition: A | Discharge status: Home / Self Care | Payer: Medicare Other | Source: Ambulatory Visit | Attending: Urology | Admitting: Urology

## 2017-03-08 ENCOUNTER — Ambulatory Visit (HOSPITAL_COMMUNITY): Payer: Medicare Other

## 2017-03-08 DIAGNOSIS — N201 Calculus of ureter: Secondary | ICD-10-CM | POA: Insufficient documentation

## 2017-03-08 DIAGNOSIS — I10 Essential (primary) hypertension: Secondary | ICD-10-CM | POA: Insufficient documentation

## 2017-03-08 DIAGNOSIS — Z6841 Body Mass Index (BMI) 40.0 and over, adult: Secondary | ICD-10-CM | POA: Diagnosis not present

## 2017-03-08 DIAGNOSIS — E669 Obesity, unspecified: Secondary | ICD-10-CM | POA: Insufficient documentation

## 2017-03-08 DIAGNOSIS — Z881 Allergy status to other antibiotic agents status: Secondary | ICD-10-CM | POA: Diagnosis not present

## 2017-03-08 DIAGNOSIS — Z79899 Other long term (current) drug therapy: Secondary | ICD-10-CM | POA: Insufficient documentation

## 2017-03-08 HISTORY — PX: EXTRACORPOREAL SHOCK WAVE LITHOTRIPSY: SHX1557

## 2017-03-08 HISTORY — DX: Personal history of urinary calculi: Z87.442

## 2017-03-08 SURGERY — LITHOTRIPSY, ESWL
Anesthesia: LOCAL | Laterality: Left

## 2017-03-08 MED ORDER — DIAZEPAM 5 MG PO TABS
10.0000 mg | ORAL_TABLET | ORAL | Status: AC
  Administered 2017-03-08: 10 mg via ORAL
  Filled 2017-03-08: qty 2

## 2017-03-08 MED ORDER — CIPROFLOXACIN HCL 500 MG PO TABS
500.0000 mg | ORAL_TABLET | ORAL | Status: AC
  Administered 2017-03-08: 500 mg via ORAL
  Filled 2017-03-08: qty 1

## 2017-03-08 MED ORDER — DIPHENHYDRAMINE HCL 25 MG PO CAPS
25.0000 mg | ORAL_CAPSULE | ORAL | Status: AC
  Administered 2017-03-08: 25 mg via ORAL
  Filled 2017-03-08: qty 1

## 2017-03-08 MED ORDER — SODIUM CHLORIDE 0.9 % IV SOLN
INTRAVENOUS | Status: DC
  Administered 2017-03-08: 07:00:00 via INTRAVENOUS

## 2017-03-08 NOTE — Interval H&P Note (Signed)
History and Physical Interval Note:  03/08/2017 7:21 AM  Katherine [name]  has presented today for surgery, with the diagnosis of LEFT PROXIMAL URETERAL STONE  The various methods of treatment have been discussed with the patient and family. After consideration of risks, benefits and other options for treatment, the patient has consented to  Procedure(s): LEFT EXTRACORPOREAL SHOCK WAVE LITHOTRIPSY (ESWL) (Left) as a surgical intervention .  The patient's history has been reviewed, patient examined, no change in status, stable for surgery.  I have reviewed the patient's chart and labs.  Questions were answered to the patient's satisfaction.     Tammera Engert,LES

## 2017-03-08 NOTE — Op Note (Signed)
See scanned chart for ESWL operative note. 

## 2017-03-08 NOTE — Discharge Instructions (Addendum)
Moderate Conscious Sedation, Adult, Care After °These instructions provide you with information about caring for yourself after your procedure. Your health care provider may also give you more specific instructions. Your treatment has been planned according to current medical practices, but problems sometimes occur. Call your health care provider if you have any problems or questions after your procedure. °What can I expect after the procedure? °After your procedure, it is common: °· To feel sleepy for several hours. °· To feel clumsy and have poor balance for several hours. °· To have poor judgment for several hours. °· To vomit if you eat too soon. ° °Follow these instructions at home: °For at least 24 hours after the procedure: ° °· Do not: °? Participate in activities where you could fall or become injured. °? Drive. °? Use heavy machinery. °? Drink alcohol. °? Take sleeping pills or medicines that cause drowsiness. °? Make important decisions or sign legal documents. °? Take care of children on your own. °· Rest. °Eating and drinking °· Follow the diet recommended by your health care provider. °· If you vomit: °? Drink water, juice, or soup when you can drink without vomiting. °? Make sure you have little or no nausea before eating solid foods. °General instructions °· Have a responsible adult stay with you until you are awake and alert. °· Take over-the-counter and prescription medicines only as told by your health care provider. °· If you smoke, do not smoke without supervision. °· Keep all follow-up visits as told by your health care provider. This is important. °Contact a health care provider if: °· You keep feeling nauseous or you keep vomiting. °· You feel light-headed. °· You develop a rash. °· You have a fever. °Get help right away if: °· You have trouble breathing. °This information is not intended to replace advice given to you by your health care provider. Make sure you discuss any questions you have  with your health care provider. °Document Released: 02/15/2013 Document Revised: 09/30/2015 Document Reviewed: 08/17/2015 °Elsevier Interactive Patient Education © 2018 [employer] °Lithotripsy, Care After °This sheet gives you information about how to care for yourself after your procedure. Your health care provider may also give you more specific instructions. If you have problems or questions, contact your health care provider. °What can I expect after the procedure? °After the procedure, it is common to have: °· Some blood in your urine. This should only last for a few days. °· Soreness in your back, sides, or upper abdomen for a few days. °· Blotches or bruises on your back where the pressure wave entered the skin. °· Pain, discomfort, or nausea when pieces (fragments) of the kidney stone move through the tube that carries urine from the kidney to the bladder (ureter). Stone fragments may pass soon after the procedure, but they may continue to pass for up to 4-8 weeks. °? If you have severe pain or nausea, contact your health care provider. This may be caused by a large stone that was not broken up, and this may mean that you need more treatment. °· Some pain or discomfort during urination. °· Some pain or discomfort in the lower abdomen or (in men) at the base of the penis. ° °Follow these instructions at home: °Medicines °· Take over-the-counter and prescription medicines only as told by your health care provider. °· If you were prescribed an antibiotic medicine, take it as told by your health care provider. Do not stop taking the antibiotic even if you   start to feel better.  Do not drive for 24 hours if you were given a medicine to help you relax (sedative).  Do not drive or use heavy machinery while taking prescription pain medicine. Eating and drinking  Drink enough water and fluids to keep your urine clear or pale yellow. This helps any remaining pieces of the stone to pass. It can also help  prevent new stones from forming.  Eat plenty of fresh fruits and vegetables.  Follow instructions from your health care provider about eating and drinking restrictions. You may be instructed: ? To reduce how much salt (sodium) you eat or drink. Check ingredients and nutrition facts on packaged foods and beverages. ? To reduce how much meat you eat.  Eat the recommended amount of calcium for your age and gender. Ask your health care provider how much calcium you should have. General instructions  Get plenty of rest.  Most people can resume normal activities 1-2 days after the procedure. Ask your health care provider what activities are safe for you.  If directed, strain all urine through the strainer that was provided by your health care provider. ? Keep all fragments for your health care provider to see. Any stones that are found may be sent to a medical lab for examination. The stone may be as small as a grain of salt.  Keep all follow-up visits as told by your health care provider. This is important. Contact a health care provider if:  You have pain that is severe or does not get better with medicine.  You have nausea that is severe or does not go away.  You have blood in your urine longer than your health care provider told you to expect.  You have more blood in your urine.  You have pain during urination that does not go away.  You urinate more frequently than usual and this does not go away.  You develop a rash or any other possible signs of an allergic reaction. Get help right away if:  You have severe pain in your back, sides, or upper abdomen.  You have severe pain while urinating.  Your urine is very dark red.  You have blood in your stool (feces).  You cannot pass any urine at all.  You feel a strong urge to urinate after emptying your bladder.  You have a fever or chills.  You develop shortness of breath, difficulty breathing, or chest pain.  You have  severe nausea that leads to persistent vomiting.  You faint. Summary  After this procedure, it is common to have some pain, discomfort, or nausea when pieces (fragments) of the kidney stone move through the tube that carries urine from the kidney to the bladder (ureter). If this pain or nausea is severe, however, you should contact your health care provider.  Most people can resume normal activities 1-2 days after the procedure. Ask your health care provider what activities are safe for you.  Drink enough water and fluids to keep your urine clear or pale yellow. This helps any remaining pieces of the stone to pass, and it can help prevent new stones from forming.  If directed, strain your urine and keep all fragments for your health care provider to see. Fragments or stones may be as small as a grain of salt.  Get help right away if you have severe pain in your back, sides, or upper abdomen or have severe pain while urinating. This information is not intended to replace advice  given to you by your health care provider. Make sure you discuss any questions you have with your health care provider. Document Released: 05/17/2007 Document Revised: 03/18/2016 Document Reviewed: 03/18/2016 Elsevier Interactive Patient Education  2017 [place] should strain your urine and collect all fragments and bring them to your follow up appointment.  2. You should take your pain medication as needed.  Please call if your pain is severe to the point that it is not controlled with your pain medication. 3. You should call if you develop fever > 101 or persistent nausea or vomiting. 4. Your doctor may prescribe tamsulosin to take to help facilitate stone passage.

## 2017-03-24 ENCOUNTER — Ambulatory Visit (INDEPENDENT_AMBULATORY_CARE_PROVIDER_SITE_OTHER): Payer: Medicare Other | Admitting: Internal Medicine

## 2017-03-24 ENCOUNTER — Encounter: Payer: Self-pay | Admitting: Internal Medicine

## 2017-03-24 VITALS — BP 136/74 | HR 56 | Temp 98.3°F | Ht 64.5 in | Wt 249.0 lb

## 2017-03-24 DIAGNOSIS — Z23 Encounter for immunization: Secondary | ICD-10-CM | POA: Diagnosis not present

## 2017-03-24 DIAGNOSIS — I10 Essential (primary) hypertension: Secondary | ICD-10-CM | POA: Diagnosis not present

## 2017-03-24 DIAGNOSIS — G609 Hereditary and idiopathic neuropathy, unspecified: Secondary | ICD-10-CM

## 2017-03-24 DIAGNOSIS — R7302 Impaired glucose tolerance (oral): Secondary | ICD-10-CM | POA: Diagnosis not present

## 2017-03-24 NOTE — Progress Notes (Signed)
Subjective:    Patient ID: Katherine [name], female    DOB: [birth year], 66 y.o.   MRN: [id number]  HPI 66 year old patient who is seen today for follow-up.  Flu vaccine administered. She is doing well since her last visit here she is required lithotripsy for asymptomatic renal stone.  She has treated hypertension and a peripheral neuropathy.  Today doing quite well.  Past Medical History:  Diagnosis Date  . Arthritis    osteo  . Bacterial infection   . Chronic kidney disease    kidney stones  . Diverticulosis of colon   . Elevated LFTs    neg for hep b and c in 2006  . Family history of adverse reaction to anesthesia    mother with sleep apnea  . History of kidney stones   . History of mumps   . Hyperlipidemia   . Hypertension   . Kidney infection   . Kidney stones   . Neuropathy    hands and feet  . Pelvic pain in female 10/27/10  . Trichomonas   . Yeast infection      Social History   Socioeconomic History  . Marital status: Single    Spouse name: Not on file  . Number of children: 1  . Years of education: Not on file  . Highest education level: Not on file  Social Needs  . Financial resource strain: Not on file  . Food insecurity - worry: Not on file  . Food insecurity - inability: Not on file  . Transportation needs - medical: Not on file  . Transportation needs - non-medical: Not on file  Occupational History    Employer: [employer]  Tobacco Use  . Smoking status: Never Smoker  . Smokeless tobacco: Never Used  Substance and Sexual Activity  . Alcohol use: No  . Drug use: No  . Sexual activity: No  Other Topics Concern  . Not on file  Social History Narrative   Patient is single, has 1 child   Patient is right handed   Caffeine consumption is 0    Past Surgical History:  Procedure Laterality Date  . ABDOMINAL HYSTERECTOMY    . CARPAL TUNNEL RELEASE    . DG ELBOW RIGHT COMPLETE ([place] HX)     right elbow surgery  . DILATION AND CURETTAGE OF  UTERUS    . HAND SURGERY  06/2011   2 surgeries on left hand  . KIDNEY STONE SURGERY    . KNEE SURGERY Bilateral    both knees done twice  . left rotator repair Left    left shoulder  . REPLACEMENT TOTAL KNEE BILATERAL    . ROTATOR CUFF REPAIR Right    x2  . TONSILLECTOMY    . TRIGGER FINGER RELEASE  2010   right  . VESICOVAGINAL FISTULA CLOSURE W/ TAH    . WRIST SURGERY  10/2011   right wrist    Family History  Problem Relation Age of Onset  . Stroke Mother   . Cancer Mother 56       bone cancer  . Diabetes Sister   . Colon cancer Neg Hx   . Esophageal cancer Neg Hx   . Rectal cancer Neg Hx   . Stomach cancer Neg Hx     Allergies  Allergen Reactions  . Erythromycin     REACTION: chest pain    Current Outpatient Medications on File Prior to Visit  Medication Sig Dispense Refill  . b complex vitamins  tablet Take 1 tablet by mouth daily.    . Cholecalciferol (VITAMIN D-3 PO) Take 2,000 Units by mouth.     Marland Kitchen HYDROcodone-acetaminophen (NORCO/VICODIN) 5-325 MG tablet Take 1-2 tablets by mouth every 6 (six) hours as needed for moderate pain.    Marland Kitchen lidocaine-prilocaine (EMLA) cream Apply four times per day as needed. (Patient taking differently: Apply four times per day as needed.) 270 g 3  . lisinopril (PRINIVIL,ZESTRIL) 10 MG tablet Take 1 tablet (10 mg total) by mouth daily. 90 tablet 1  . nortriptyline (PAMELOR) 25 MG capsule Take 3 capsules (75 mg total) by mouth at bedtime. 270 capsule 4  . pregabalin (LYRICA) 100 MG capsule Take 1 capsule (100 mg total) by mouth 2 (two) times daily. Please call [phone] to schedule an appt. 180 capsule 0  . RESTASIS 0.05 % ophthalmic emulsion Place 1 drop into both eyes. Reported on 07/09/2015    . tamsulosin (FLOMAX) 0.4 MG CAPS capsule Take 0.4 mg by mouth.    . traMADol (ULTRAM) 50 MG tablet Take one to two tablets by mouth every 6 hours if needed for pain, Prescribed by Neurology 180 tablet 4  . [DISCONTINUED] gabapentin (NEURONTIN)  300 MG capsule Take 300 mg by mouth 3 (three) times daily.       No current facility-administered medications on file prior to visit.     BP 136/74 (BP Location: Left Arm, Patient Position: Sitting, Cuff Size: Normal)   Pulse (!) 56   Temp 98.3 F (36.8 C) (Oral)   Ht 5' 4.5" (1.638 m)   Wt 249 lb (112.9 kg)   SpO2 97%   BMI 42.08 kg/m      Review of Systems  Constitutional: Negative.   HENT: Negative for congestion, dental problem, hearing loss, rhinorrhea, sinus pressure, sore throat and tinnitus.   Eyes: Negative for pain, discharge and visual disturbance.  Respiratory: Negative for cough and shortness of breath.   Cardiovascular: Negative for chest pain, palpitations and leg swelling.  Gastrointestinal: Negative for abdominal distention, abdominal pain, blood in stool, constipation, diarrhea, nausea and vomiting.  Genitourinary: Negative for difficulty urinating, dysuria, flank pain, frequency, hematuria, pelvic pain, urgency, vaginal bleeding, vaginal discharge and vaginal pain.  Musculoskeletal: Negative for arthralgias, gait problem and joint swelling.  Skin: Negative for rash.  Neurological: Negative for dizziness, syncope, speech difficulty, weakness, numbness and headaches.  Hematological: Negative for adenopathy.  Psychiatric/Behavioral: Negative for agitation, behavioral problems and dysphoric mood. The patient is not nervous/anxious.        Objective:   Physical Exam  Constitutional: She is oriented to person, place, and time. She appears well-developed and well-nourished.  Weight 249 Blood pressure well controlled  HENT:  Head: Normocephalic.  Right Ear: External ear normal.  Left Ear: External ear normal.  Mouth/Throat: Oropharynx is clear and moist.  Eyes: Conjunctivae and EOM are normal. Pupils are equal, round, and reactive to light.  Neck: Normal range of motion. Neck supple. No thyromegaly present.  Cardiovascular: Normal rate, regular rhythm, normal  heart sounds and intact distal pulses.  Pulmonary/Chest: Effort normal and breath sounds normal.  Abdominal: Soft. Bowel sounds are normal. She exhibits no mass. There is no tenderness.  Musculoskeletal: Normal range of motion.  Lymphadenopathy:    She has no cervical adenopathy.  Neurological: She is alert and oriented to person, place, and time.  Skin: Skin is warm and dry. No rash noted.  Psychiatric: She has a normal mood and affect. Her behavior is normal.  Assessment & Plan:   Essential hypertension stable Obesity Recurrent renal stone disease Peripheral neuropathy.  No change in therapy  Follow-up 6 months Flu vaccine administered  Nyoka Cowden

## 2017-03-24 NOTE — Patient Instructions (Signed)

## 2017-03-25 ENCOUNTER — Telehealth: Payer: Self-pay | Admitting: Internal Medicine

## 2017-03-25 ENCOUNTER — Other Ambulatory Visit: Payer: Self-pay | Admitting: Internal Medicine

## 2017-03-25 MED ORDER — LISINOPRIL 10 MG PO TABS: 10.0000 mg | ORAL_TABLET | Freq: Every day | ORAL | 1 refills | Status: DC

## 2017-03-25 MED ORDER — LIDOCAINE-PRILOCAINE 2.5-2.5 % EX CREA: TOPICAL_CREAM | CUTANEOUS | 3 refills | Status: DC

## 2017-03-25 MED ORDER — PREGABALIN 100 MG PO CAPS: 100.0000 mg | ORAL_CAPSULE | Freq: Two times a day (BID) | ORAL | 2 refills | Status: DC

## 2017-03-25 NOTE — Telephone Encounter (Signed)
Copied from [place] [id number]. Topic: General - Other >> Mar 25, 2017  1:25 PM Scherrie Gerlach wrote: Pt was to call back and inform Dr Raliegh Ip of her remaining refills pregabalin (LYRICA) 100 MG capsule (none)     (*pt does need this) lidocaine-prilocaine (EMLA) cream (one) lisinopril (PRINIVIL,ZESTRIL) 10 MG tablet (one)  [place], [place] [phone] (Phone) [phone] (Fax)

## 2017-03-25 NOTE — Telephone Encounter (Signed)
Okay for medicine refills

## 2017-03-25 NOTE — Telephone Encounter (Signed)
Copied from [place] [id number]. Topic: General - Other >> Mar 25, 2017  1:25 PM Scherrie Gerlach wrote: Pt was to call back and inform Dr Raliegh Ip of her remaining refills pregabalin (LYRICA) 100 MG capsule (none)     (*pt does need this) lidocaine-prilocaine (EMLA) cream (one refill left) lisinopril (PRINIVIL,ZESTRIL) 10 MG tablet (one refill left)  [place], [place] [phone] (Phone) [phone] (Fax)

## 2017-03-25 NOTE — Telephone Encounter (Signed)
Medication were refilled.

## 2017-03-25 NOTE — Telephone Encounter (Signed)
Please advise 

## 2017-05-17 ENCOUNTER — Encounter (INDEPENDENT_AMBULATORY_CARE_PROVIDER_SITE_OTHER): Payer: Self-pay | Admitting: Physician Assistant

## 2017-05-17 ENCOUNTER — Ambulatory Visit (INDEPENDENT_AMBULATORY_CARE_PROVIDER_SITE_OTHER): Payer: Medicare Other

## 2017-05-17 ENCOUNTER — Ambulatory Visit (INDEPENDENT_AMBULATORY_CARE_PROVIDER_SITE_OTHER): Payer: Medicare Other | Admitting: Physician Assistant

## 2017-05-17 VITALS — Ht 64.5 in | Wt 246.0 lb

## 2017-05-17 DIAGNOSIS — M25531 Pain in right wrist: Secondary | ICD-10-CM | POA: Diagnosis not present

## 2017-05-17 MED ORDER — LIDOCAINE HCL 1 % IJ SOLN
0.5000 mL | INTRAMUSCULAR | Status: AC | PRN
  Administered 2017-05-17: .5 mL

## 2017-05-17 MED ORDER — METHYLPREDNISOLONE ACETATE 40 MG/ML IJ SUSP
20.0000 mg | INTRAMUSCULAR | Status: AC | PRN
  Administered 2017-05-17: 20 mg

## 2017-05-17 NOTE — Progress Notes (Signed)
Office Visit Note   Patient: Katherine [name]           Date of Birth: 12-30-1950           MRN: [id number] Visit Date: 05/17/2017              Requested by: Marletta Lor, MD [place], [place] [address] PCP: Marletta Lor, MD   Assessment & Plan: Visit Diagnoses:  1. Pain in right wrist     Plan: She is given a removable Velcro wrist splint which she will wear when doing activities.  She will remove it for bathing and sleeping.  Have her follow-up in 2 weeks check progress lack of in regards to her right wrist pain.  Pain persist or becomes worse in any benefit from an MRI to evaluate her TFCC.  Follow-Up Instructions: Return in about 2 weeks (around 05/31/2017).   Orders:  Orders Placed This Encounter  Procedures  . Hand/UE Inj  . XR Wrist Complete Right   No orders of the defined types were placed in this encounter.     Procedures: Hand/UE Inj for (TFCC) on 05/17/2017 11:33 AM Details: 25 G needle, lateral approach Medications: 0.5 mL lidocaine 1 %; 20 mg methylPREDNISolone acetate 40 MG/ML      Clinical Data: No additional findings.   Subjective: Chief Complaint  Patient presents with  . Right Wrist - Pain    HPI Mrs. Kuyper is a 67 year old female well-known to Dr. Ninfa Linden service had multiple surgeries over the years of both wrist and hands.  She comes in today due to right his wrist pain that has been ongoing for the past few months.  Said no known injury to the right wrist.  Pain comes and goes depending upon activities.  She is right-hand dominant though.  She states she has increased pain with lifting objects with the right hand and she demonstrates the hand pronated lifting dorsiflexion of the wrist.  She also has pain when twisting and she moves the arm supinated position.  Pain is over the distal ulnar region.  She is tried tramadol with only slight relief. Review of Systems No fevers chills shortness of breath chest  pain otherwise please see HPI  Objective: Vital Signs: Ht 5' 4.5" (1.638 m)   Wt 246 lb (111.6 kg)   BMI 41.57 kg/m   Physical Exam  Constitutional: She is oriented to person, place, and time. She appears well-developed and well-nourished. No distress.  Pulmonary/Chest: Effort normal.  Neurological: She is alert and oriented to person, place, and time.  Skin: She is not diaphoretic.  Psychiatric: She has a normal mood and affect.    Ortho Exam Radial pulses are 2+ bilaterally.  She has subjective decreased throughout bilateral hands.  Full motor bilateral hands.  She has good dorsiflexion , volar flexion of both wrists.  Ulnar and radial deviation causes no discomfort or full bilaterally.  She has good range of motion bilateral elbows nontender of the medial lateral epicondyle bilaterally.  Tenderness over the right TFCC region only.  No rashes skin lesions ulcerations erythema or swelling of either wrist.  Pain at the right wrist and hand are nontender. Specialty Comments:  No specialty comments available.  Imaging: Xr Wrist Complete Right  Result Date: 05/17/2017 Right wrist complete: No acute fractures no bony abnormalities.  Wrist is well located.Marland Kitchen    PMFS History: Patient Active Problem List   Diagnosis Date Noted  . Trigger finger, right index  finger 09/28/2016  . Impaired glucose tolerance 09/09/2015  . Hyperparathyroidism ([place]) 05/26/2013  . Hereditary and idiopathic peripheral neuropathy 12/29/2012  . Obese   . Menopausal symptoms 11/10/2011  . DIVERTICULOSIS, COLON 11/02/2007  . Dyslipidemia 03/09/2006  . Essential hypertension 03/09/2006  . NEPHROLITHIASIS, HX OF 03/09/2006   Past Medical History:  Diagnosis Date  . Arthritis    osteo  . Bacterial infection   . Chronic kidney disease    kidney stones  . Diverticulosis of colon   . Elevated LFTs    neg for hep b and c in 2006  . Family history of adverse reaction to anesthesia    mother with sleep apnea    . History of kidney stones   . History of mumps   . Hyperlipidemia   . Hypertension   . Kidney infection   . Kidney stones   . Neuropathy    hands and feet  . Pelvic pain in female 10/27/10  . Trichomonas   . Yeast infection     Family History  Problem Relation Age of Onset  . Stroke Mother   . Cancer Mother 86       bone cancer  . Diabetes Sister   . Colon cancer Neg Hx   . Esophageal cancer Neg Hx   . Rectal cancer Neg Hx   . Stomach cancer Neg Hx     Past Surgical History:  Procedure Laterality Date  . ABDOMINAL HYSTERECTOMY    . CARPAL TUNNEL RELEASE    . CYSTOSCOPY WITH RETROGRADE PYELOGRAM, URETEROSCOPY AND STENT PLACEMENT Left 12/18/2014   Procedure: CYSTOSCOPY WITH RETROGRADE PYELOGRAM, URETEROSCOPY AND STENT PLACEMENT left ureter;  Surgeon: Raynelle Bring, MD;  Location: [facility];  Service: Urology;  Laterality: Left;  . DG ELBOW RIGHT COMPLETE ([place] HX)     right elbow surgery  . DILATION AND CURETTAGE OF UTERUS    . EXTRACORPOREAL SHOCK WAVE LITHOTRIPSY Left 03/08/2017   Procedure: LEFT EXTRACORPOREAL SHOCK WAVE LITHOTRIPSY (ESWL);  Surgeon: Raynelle Bring, MD;  Location: [facility];  Service: Urology;  Laterality: Left;  . HAND SURGERY  06/2011   2 surgeries on left hand  . HOLMIUM LASER APPLICATION Left 05/18/5629   Procedure: HOLMIUM LASER APPLICATION left ureter ;  Surgeon: Raynelle Bring, MD;  Location: [facility];  Service: Urology;  Laterality: Left;  . KIDNEY STONE SURGERY    . KNEE SURGERY Bilateral    both knees done twice  . left rotator repair Left    left shoulder  . PARATHYROIDECTOMY Right 05/03/2015   Procedure: RIGHT SUPERIOR PARATHYROIDECTOMY;  Surgeon: Armandina Gemma, MD;  Location: [facility];  Service: General;  Laterality: Right;  . REPLACEMENT TOTAL KNEE BILATERAL    . ROTATOR CUFF REPAIR Right    x2  . TONSILLECTOMY    . TRIGGER FINGER RELEASE  2010   right  . VESICOVAGINAL FISTULA CLOSURE W/ TAH    . WRIST SURGERY  10/2011   right wrist   Social  History   Occupational History    Employer: [employer]  Tobacco Use  . Smoking status: Never Smoker  . Smokeless tobacco: Never Used  Substance and Sexual Activity  . Alcohol use: No  . Drug use: No  . Sexual activity: No

## 2017-06-01 ENCOUNTER — Encounter (INDEPENDENT_AMBULATORY_CARE_PROVIDER_SITE_OTHER): Payer: Self-pay | Admitting: Orthopaedic Surgery

## 2017-06-01 ENCOUNTER — Ambulatory Visit (INDEPENDENT_AMBULATORY_CARE_PROVIDER_SITE_OTHER): Payer: Medicare Other | Admitting: Orthopaedic Surgery

## 2017-06-01 ENCOUNTER — Other Ambulatory Visit (INDEPENDENT_AMBULATORY_CARE_PROVIDER_SITE_OTHER): Payer: Self-pay

## 2017-06-01 DIAGNOSIS — M25531 Pain in right wrist: Secondary | ICD-10-CM | POA: Diagnosis not present

## 2017-06-01 NOTE — Progress Notes (Signed)
The patient is continue to follow-up with right-sided wrist pain.  This is on the ulnar aspect of her wrist.  This has been becoming worse for her.  She is been trying anti-inflammatories both topically and orally.  She is been wearing a wrist splint as well.  At her last visit we provided an injection of steroids in the ulnar aspect of her wrist is most of her pain occurs with ulnar deviation as well as pronation supination movements of the wrist.  It hurts significantly with lifting activities.  She is someone with peripheral neuropathy and she also wears compressive gloves on her hands.  On exam there is no swelling about her wrist patient is significant pain over the ulnar styloid and ulnar aspect of her wrist.  This occurs with palpation and range of motion.  Her plain films from last time showed no gross pathology on plain films of the lateral aspect or ulnar aspect of her right wrist.  At this point we need to obtain an MRI arthrogram of the right wrist to assess for any type of pathology that is causing her pain such as a TFCC tear or injury to the ECU tendon where she is also tender.  We will see her back after the MRI.  She will continue to respond for now.

## 2017-06-11 DIAGNOSIS — S63599A Other specified sprain of unspecified wrist, initial encounter: Secondary | ICD-10-CM

## 2017-06-11 DIAGNOSIS — M25831 Other specified joint disorders, right wrist: Secondary | ICD-10-CM

## 2017-06-11 HISTORY — DX: Other specified joint disorders, right wrist: M25.831

## 2017-06-11 HISTORY — DX: Other specified sprain of unspecified wrist, initial encounter: S63.599A

## 2017-06-15 ENCOUNTER — Ambulatory Visit (INDEPENDENT_AMBULATORY_CARE_PROVIDER_SITE_OTHER): Payer: Medicare Other | Admitting: Orthopaedic Surgery

## 2017-06-15 ENCOUNTER — Ambulatory Visit
Admission: RE | Admit: 2017-06-15 | Discharge: 2017-06-15 | Disposition: A | Discharge status: Home / Self Care | Payer: Medicare Other | Source: Ambulatory Visit | Attending: Orthopaedic Surgery | Admitting: Orthopaedic Surgery

## 2017-06-15 DIAGNOSIS — M25531 Pain in right wrist: Secondary | ICD-10-CM

## 2017-06-15 MED ORDER — IOPAMIDOL (ISOVUE-M 200) INJECTION 41%
2.0000 mL | Freq: Once | INTRAMUSCULAR | Status: AC
  Administered 2017-06-15: 2 mL via INTRA_ARTICULAR

## 2017-06-21 ENCOUNTER — Encounter (INDEPENDENT_AMBULATORY_CARE_PROVIDER_SITE_OTHER): Payer: Self-pay | Admitting: Orthopaedic Surgery

## 2017-06-21 ENCOUNTER — Other Ambulatory Visit: Payer: Self-pay | Admitting: Internal Medicine

## 2017-06-21 ENCOUNTER — Ambulatory Visit (INDEPENDENT_AMBULATORY_CARE_PROVIDER_SITE_OTHER): Payer: Medicare Other | Admitting: Orthopaedic Surgery

## 2017-06-21 DIAGNOSIS — M25531 Pain in right wrist: Secondary | ICD-10-CM

## 2017-06-21 NOTE — Progress Notes (Signed)
Patient is a right-hand-dominant female who is been dealing with chronic right wrist pain for some time now.  Most of this pain is been ulnar-sided.  She failed steroid injection so we sent her for an MRI to rule out a TFCC tear as well as to assess the other structures in her wrist joint.  This is her dominant wrist.  Is become frustrating for him in terms of the patient has been having especially with activities.  On exam most of her pain is still ulnar-sided wrist pain.  She hurts with radial and ulnar deviation more so with ulnar deviation but also dorsiflexion of the wrist.  MRI is shared with her and does show signs of ulnar impaction syndrome.  The TFCC itself was not torn but there are degenerative changes in the lunate as well as synovitis in the wrist joint itself and some mild to moderate arthritic changes of the carpal bones.  This point I would like to send her to my partner Dr. Erlinda Hong who have spoken to about her case.  I informed her that we would discuss treatment options such as the potential for arthroscopic intervention for the wrist and ulnar shortening osteotomy as a possible treatment as well.  All questions concerns were answered and addressed.  We will work on getting her on Dr. Phoebe Sharps schedule.

## 2017-06-22 ENCOUNTER — Ambulatory Visit (INDEPENDENT_AMBULATORY_CARE_PROVIDER_SITE_OTHER): Payer: Medicare Other | Admitting: Orthopaedic Surgery

## 2017-06-22 ENCOUNTER — Encounter (INDEPENDENT_AMBULATORY_CARE_PROVIDER_SITE_OTHER): Payer: Self-pay | Admitting: Orthopaedic Surgery

## 2017-06-22 DIAGNOSIS — M25831 Other specified joint disorders, right wrist: Secondary | ICD-10-CM

## 2017-06-22 DIAGNOSIS — M24131 Other articular cartilage disorders, right wrist: Secondary | ICD-10-CM | POA: Diagnosis not present

## 2017-06-22 NOTE — Progress Notes (Signed)
Office Visit Note   Patient: Katherine [name]           Date of Birth: May 23, 1950           MRN: [id number] Visit Date: 06/22/2017              Requested by: Marletta Lor, MD [place], [place] [address] PCP: Marletta Lor, MD   Assessment & Plan: Visit Diagnoses:  1. Ulnar impaction syndrome, right   2. 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  MRI and x-rays were reviewed with the patient as well as the proposed wrist arthroscopy with debridement and ulnar shortening osteotomy.  Her x-rays show that she is ulnar neutral but given her symptoms and MRI findings it is consistent with her clinical presentation.  She understands the risk benefits alternatives of surgery including incomplete relief of pain or non-union of the osteotomy site.  She understands and wished to proceed.  We will get her scheduled in the near future.  Follow-Up Instructions: Return if symptoms worsen or fail to improve.   Orders:  No orders of the defined types were placed in this encounter.  No orders of the defined types were placed in this encounter.     Procedures: No procedures performed   Clinical Data: No additional findings.   Subjective: Chief Complaint  Patient presents with  . Right Wrist - Follow-up    Patient is a 67 year old female comes in with chronic ulnar-sided wrist pain who has been referred to me by Dr. Ninfa Linden.  She has had 4 months of chronic right ulnar-sided wrist pain.  Denies any injuries.  The pain is worse with use of the hand and ulnar deviation.  Denies any numbness and tingling associated with this but she does have baseline neuropathy.    Review of Systems  Constitutional: Negative.   HENT: Negative.   Eyes: Negative.   Respiratory: Negative.   Cardiovascular: Negative.   Endocrine: Negative.   Musculoskeletal: Negative.     Neurological: Negative.   Hematological: Negative.   Psychiatric/Behavioral: Negative.   All other systems reviewed and are negative.    Objective: Vital Signs: There were no vitals taken for this visit.  Physical Exam  Constitutional: She is oriented to person, place, and time. She appears well-developed and well-nourished.  Pulmonary/Chest: Effort normal.  Neurological: She is alert and oriented to person, place, and time.  Skin: Skin is warm. Capillary refill takes less than 2 seconds.  Psychiatric: She has a normal mood and affect. Her behavior is normal. Judgment and thought content normal.  Nursing note and vitals reviewed.   Ortho Exam Right wrist exam shows tenderness at the TFCC fovea.  Pain with ulnar deviation.  ECU tendon is stable. Specialty Comments:  No specialty comments available.  Imaging: No results found.   PMFS History: Patient Active Problem List   Diagnosis Date Noted  . Pain in right wrist 06/01/2017  . Trigger finger, right index finger 09/28/2016  . Impaired glucose tolerance 09/09/2015  . Hyperparathyroidism ([place]) 05/26/2013  . Hereditary and idiopathic peripheral neuropathy 12/29/2012  . Obese   . Menopausal symptoms 11/10/2011  . DIVERTICULOSIS, COLON 11/02/2007  . Dyslipidemia 03/09/2006  . Essential hypertension 03/09/2006  . NEPHROLITHIASIS, HX OF 03/09/2006   Past Medical History:  Diagnosis Date  . Arthritis    osteo  . Bacterial infection   . Chronic kidney  disease    kidney stones  . Diverticulosis of colon   . Elevated LFTs    neg for hep b and c in 2006  . Family history of adverse reaction to anesthesia    mother with sleep apnea  . History of kidney stones   . History of mumps   . Hyperlipidemia   . Hypertension   . Kidney infection   . Kidney stones   . Neuropathy    hands and feet  . Pelvic pain in female 10/27/10  . Trichomonas   . Yeast infection     Family History  Problem Relation Age of Onset  .  Stroke Mother   . Cancer Mother 5       bone cancer  . Diabetes Sister   . Colon cancer Neg Hx   . Esophageal cancer Neg Hx   . Rectal cancer Neg Hx   . Stomach cancer Neg Hx     Past Surgical History:  Procedure Laterality Date  . ABDOMINAL HYSTERECTOMY    . CARPAL TUNNEL RELEASE    . CYSTOSCOPY WITH RETROGRADE PYELOGRAM, URETEROSCOPY AND STENT PLACEMENT Left 12/18/2014   Procedure: CYSTOSCOPY WITH RETROGRADE PYELOGRAM, URETEROSCOPY AND STENT PLACEMENT left ureter;  Surgeon: Raynelle Bring, MD;  [facility];  Service: Urology;  Laterality: Left;  . DG ELBOW RIGHT COMPLETE ([facility] HX)     right elbow surgery  . DILATION AND CURETTAGE OF UTERUS    . EXTRACORPOREAL SHOCK WAVE LITHOTRIPSY Left 03/08/2017   Procedure: LEFT EXTRACORPOREAL SHOCK WAVE LITHOTRIPSY (ESWL);  Surgeon: Raynelle Bring, MD;  [facility];  Service: Urology;  Laterality: Left;  . HAND SURGERY  06/2011   2 surgeries on left hand  . HOLMIUM LASER APPLICATION Left 08/17/163   Procedure: HOLMIUM LASER APPLICATION left ureter ;  Surgeon: Raynelle Bring, MD;  [facility];  Service: Urology;  Laterality: Left;  . KIDNEY STONE SURGERY    . KNEE SURGERY Bilateral    both knees done twice  . left rotator repair Left    left shoulder  . PARATHYROIDECTOMY Right 05/03/2015   Procedure: RIGHT SUPERIOR PARATHYROIDECTOMY;  Surgeon: Armandina Gemma, MD;  Location: [facility];  Service: General;  Laterality: Right;  . REPLACEMENT TOTAL KNEE BILATERAL    . ROTATOR CUFF REPAIR Right    x2  . TONSILLECTOMY    . TRIGGER FINGER RELEASE  2010   right  . VESICOVAGINAL FISTULA CLOSURE W/ TAH    . WRIST SURGERY  10/2011   right wrist   Social History   Occupational History    Employer: [employer]  Tobacco Use  . Smoking status: Never Smoker  . Smokeless tobacco: Never Used  Substance and Sexual Activity  . Alcohol use: No  . Drug use: No  . Sexual activity: No

## 2017-07-02 ENCOUNTER — Encounter (HOSPITAL_BASED_OUTPATIENT_CLINIC_OR_DEPARTMENT_OTHER): Payer: Self-pay | Admitting: *Deleted

## 2017-07-02 ENCOUNTER — Other Ambulatory Visit: Payer: Self-pay

## 2017-07-02 NOTE — Pre-Procedure Instructions (Signed)
To come for BMET and EKG 

## 2017-07-06 ENCOUNTER — Encounter (HOSPITAL_BASED_OUTPATIENT_CLINIC_OR_DEPARTMENT_OTHER)
Admission: RE | Admit: 2017-07-06 | Discharge: 2017-07-06 | Disposition: A | Discharge status: Home / Self Care | Payer: Medicare Other | Source: Ambulatory Visit | Attending: Orthopaedic Surgery | Admitting: Orthopaedic Surgery

## 2017-07-06 DIAGNOSIS — M24831 Other specific joint derangements of right wrist, not elsewhere classified: Secondary | ICD-10-CM | POA: Diagnosis not present

## 2017-07-06 DIAGNOSIS — M19031 Primary osteoarthritis, right wrist: Secondary | ICD-10-CM | POA: Diagnosis not present

## 2017-07-06 DIAGNOSIS — I10 Essential (primary) hypertension: Secondary | ICD-10-CM | POA: Diagnosis not present

## 2017-07-06 DIAGNOSIS — Z881 Allergy status to other antibiotic agents status: Secondary | ICD-10-CM | POA: Diagnosis not present

## 2017-07-06 DIAGNOSIS — Z87442 Personal history of urinary calculi: Secondary | ICD-10-CM | POA: Diagnosis not present

## 2017-07-06 DIAGNOSIS — Z6841 Body Mass Index (BMI) 40.0 and over, adult: Secondary | ICD-10-CM | POA: Diagnosis not present

## 2017-07-06 DIAGNOSIS — M65831 Other synovitis and tenosynovitis, right forearm: Secondary | ICD-10-CM | POA: Diagnosis present

## 2017-07-06 DIAGNOSIS — G629 Polyneuropathy, unspecified: Secondary | ICD-10-CM | POA: Diagnosis not present

## 2017-07-06 DIAGNOSIS — Z96653 Presence of artificial knee joint, bilateral: Secondary | ICD-10-CM | POA: Diagnosis not present

## 2017-07-06 DIAGNOSIS — Z79899 Other long term (current) drug therapy: Secondary | ICD-10-CM | POA: Diagnosis not present

## 2017-07-06 LAB — BASIC METABOLIC PANEL
ANION GAP: 11 (ref 5–15)
BUN: 7 mg/dL (ref 6–20)
CO2: 25 mmol/L (ref 22–32)
Calcium: 9.7 mg/dL (ref 8.9–10.3)
Chloride: 103 mmol/L (ref 101–111)
Creatinine, Ser: 0.82 mg/dL (ref 0.44–1.00)
GLUCOSE: 137 mg/dL — AB (ref 65–99)
Potassium: 3.8 mmol/L (ref 3.5–5.1)
Sodium: 139 mmol/L (ref 135–145)

## 2017-07-06 NOTE — Anesthesia Preprocedure Evaluation (Addendum)
Anesthesia Evaluation  Patient identified by MRN, date of birth, ID band Patient awake    Reviewed: Allergy & Precautions, NPO status , Patient's Chart, lab work & pertinent test results  Airway Mallampati: II  TM Distance: >3 FB Neck ROM: Full    Dental  (+) Missing   Pulmonary neg pulmonary ROS,    Pulmonary exam normal breath sounds clear to auscultation       Cardiovascular hypertension, Pt. on medications Normal cardiovascular exam Rhythm:Regular Rate:Normal  ECG: NSR, rate 99   Neuro/Psych Peripheral neuropathy  Neuromuscular disease negative psych ROS   GI/Hepatic negative GI ROS, Neg liver ROS,   Endo/Other  Morbid obesity  Renal/GU negative Renal ROS     Musculoskeletal negative musculoskeletal ROS (+)   Abdominal (+) + obese,   Peds  Hematology negative hematology ROS (+)   Anesthesia Other Findings right triangular fibrocartilage complex tear, ulnar impaction syndrome  Reproductive/Obstetrics                            Anesthesia Physical Anesthesia Plan  ASA: III  Anesthesia Plan: General   Post-op Pain Management:    Induction: Intravenous  PONV Risk Score and Plan: 3 and Dexamethasone, Ondansetron and Midazolam  Airway Management Planned: LMA  Additional Equipment:   Intra-op Plan:   Post-operative Plan: Extubation in OR  Informed Consent: I have reviewed the patients History and Physical, chart, labs and discussed the procedure including the risks, benefits and alternatives for the proposed anesthesia with the patient or authorized representative who has indicated his/her understanding and acceptance.   Dental advisory given  Plan Discussed with: CRNA  Anesthesia Plan Comments:        Anesthesia Quick Evaluation

## 2017-07-06 NOTE — Progress Notes (Signed)
EKG reviewed by Dr. Linna Caprice, will proceed with surgery as scheduled.

## 2017-07-07 ENCOUNTER — Encounter (HOSPITAL_BASED_OUTPATIENT_CLINIC_OR_DEPARTMENT_OTHER): Payer: Self-pay | Admitting: *Deleted

## 2017-07-07 ENCOUNTER — Encounter (HOSPITAL_BASED_OUTPATIENT_CLINIC_OR_DEPARTMENT_OTHER)
Admission: RE | Disposition: A | Discharge status: Home / Self Care | Payer: Self-pay | Source: Ambulatory Visit | Attending: Orthopaedic Surgery

## 2017-07-07 ENCOUNTER — Ambulatory Visit (HOSPITAL_BASED_OUTPATIENT_CLINIC_OR_DEPARTMENT_OTHER): Payer: Medicare Other | Admitting: Anesthesiology

## 2017-07-07 ENCOUNTER — Ambulatory Visit (HOSPITAL_BASED_OUTPATIENT_CLINIC_OR_DEPARTMENT_OTHER)
Admission: RE | Admit: 2017-07-07 | Discharge: 2017-07-07 | Disposition: A | Discharge status: Home / Self Care | Payer: Medicare Other | Source: Ambulatory Visit | Attending: Orthopaedic Surgery | Admitting: Orthopaedic Surgery

## 2017-07-07 DIAGNOSIS — Z79899 Other long term (current) drug therapy: Secondary | ICD-10-CM | POA: Insufficient documentation

## 2017-07-07 DIAGNOSIS — M19031 Primary osteoarthritis, right wrist: Secondary | ICD-10-CM | POA: Insufficient documentation

## 2017-07-07 DIAGNOSIS — M24831 Other specific joint derangements of right wrist, not elsewhere classified: Secondary | ICD-10-CM | POA: Diagnosis not present

## 2017-07-07 DIAGNOSIS — Z6841 Body Mass Index (BMI) 40.0 and over, adult: Secondary | ICD-10-CM | POA: Insufficient documentation

## 2017-07-07 DIAGNOSIS — M65831 Other synovitis and tenosynovitis, right forearm: Secondary | ICD-10-CM | POA: Insufficient documentation

## 2017-07-07 DIAGNOSIS — M25831 Other specified joint disorders, right wrist: Secondary | ICD-10-CM

## 2017-07-07 DIAGNOSIS — Z87442 Personal history of urinary calculi: Secondary | ICD-10-CM | POA: Insufficient documentation

## 2017-07-07 DIAGNOSIS — G629 Polyneuropathy, unspecified: Secondary | ICD-10-CM | POA: Insufficient documentation

## 2017-07-07 DIAGNOSIS — Z96653 Presence of artificial knee joint, bilateral: Secondary | ICD-10-CM | POA: Insufficient documentation

## 2017-07-07 DIAGNOSIS — I10 Essential (primary) hypertension: Secondary | ICD-10-CM | POA: Insufficient documentation

## 2017-07-07 DIAGNOSIS — Z881 Allergy status to other antibiotic agents status: Secondary | ICD-10-CM | POA: Insufficient documentation

## 2017-07-07 HISTORY — DX: Polyneuropathy, unspecified: G62.9

## 2017-07-07 HISTORY — DX: Other specified sprain of unspecified wrist, initial encounter: S63.599A

## 2017-07-07 HISTORY — DX: Unspecified osteoarthritis, unspecified site: M19.90

## 2017-07-07 HISTORY — PX: WRIST ARTHROSCOPY WITH ULNA SHORTENING: SHX5681

## 2017-07-07 HISTORY — DX: Presence of dental prosthetic device (complete) (partial): Z97.2

## 2017-07-07 HISTORY — DX: Dental restoration status: Z98.811

## 2017-07-07 HISTORY — DX: Other specified joint disorders, right wrist: M25.831

## 2017-07-07 SURGERY — WRIST ARTHROSCOPY WITH ULNA SHORTENING
Anesthesia: General | Site: Wrist | Laterality: Right

## 2017-07-07 MED ORDER — SODIUM CHLORIDE 0.9 % IR SOLN
Status: DC | PRN
  Administered 2017-07-07: 1500 mL

## 2017-07-07 MED ORDER — OXYCODONE-ACETAMINOPHEN 5-325 MG PO TABS: 1.0000 | ORAL_TABLET | ORAL | 0 refills | Status: DC | PRN

## 2017-07-07 MED ORDER — MIDAZOLAM HCL 2 MG/2ML IJ SOLN
1.0000 mg | INTRAMUSCULAR | Status: DC | PRN
  Administered 2017-07-07: 2 mg via INTRAVENOUS

## 2017-07-07 MED ORDER — PROMETHAZINE HCL 25 MG PO TABS: 25.0000 mg | ORAL_TABLET | Freq: Four times a day (QID) | ORAL | 1 refills | Status: DC | PRN

## 2017-07-07 MED ORDER — ZINC SULFATE 220 (50 ZN) MG PO CAPS: 220.0000 mg | ORAL_CAPSULE | Freq: Every day | ORAL | 0 refills | Status: DC

## 2017-07-07 MED ORDER — CHLORHEXIDINE GLUCONATE 4 % EX LIQD: 60.0000 mL | Freq: Once | CUTANEOUS | Status: DC

## 2017-07-07 MED ORDER — PROMETHAZINE HCL 25 MG/ML IJ SOLN: 6.2500 mg | INTRAMUSCULAR | Status: DC | PRN

## 2017-07-07 MED ORDER — PROPOFOL 10 MG/ML IV BOLUS
INTRAVENOUS | Status: DC | PRN
  Administered 2017-07-07: 150 mg via INTRAVENOUS

## 2017-07-07 MED ORDER — KETOROLAC TROMETHAMINE 30 MG/ML IJ SOLN
INTRAMUSCULAR | Status: AC
  Filled 2017-07-07: qty 1

## 2017-07-07 MED ORDER — METOPROLOL TARTRATE 5 MG/5ML IV SOLN
2.5000 mg | Freq: Once | INTRAVENOUS | Status: AC
  Administered 2017-07-07: 2.5 mg via INTRAVENOUS

## 2017-07-07 MED ORDER — OXYCODONE HCL 5 MG PO TABS: 5.0000 mg | ORAL_TABLET | Freq: Once | ORAL | Status: DC | PRN

## 2017-07-07 MED ORDER — ONDANSETRON HCL 4 MG/2ML IJ SOLN
INTRAMUSCULAR | Status: AC
  Filled 2017-07-07: qty 2

## 2017-07-07 MED ORDER — FENTANYL CITRATE (PF) 100 MCG/2ML IJ SOLN
INTRAMUSCULAR | Status: AC
  Filled 2017-07-07: qty 2

## 2017-07-07 MED ORDER — SCOPOLAMINE 1 MG/3DAYS TD PT72: 1.0000 | MEDICATED_PATCH | Freq: Once | TRANSDERMAL | Status: DC | PRN

## 2017-07-07 MED ORDER — LIDOCAINE 2% (20 MG/ML) 5 ML SYRINGE
INTRAMUSCULAR | Status: AC
  Filled 2017-07-07: qty 5

## 2017-07-07 MED ORDER — BUPIVACAINE HCL (PF) 0.25 % IJ SOLN
INTRAMUSCULAR | Status: AC
  Filled 2017-07-07: qty 30

## 2017-07-07 MED ORDER — LIDOCAINE HCL (CARDIAC) 20 MG/ML IV SOLN: INTRAVENOUS | Status: DC | PRN

## 2017-07-07 MED ORDER — PROPOFOL 500 MG/50ML IV EMUL
INTRAVENOUS | Status: AC
  Filled 2017-07-07: qty 50

## 2017-07-07 MED ORDER — ACETAMINOPHEN 10 MG/ML IV SOLN
INTRAVENOUS | Status: AC
  Filled 2017-07-07: qty 100

## 2017-07-07 MED ORDER — HYDROMORPHONE HCL 1 MG/ML IJ SOLN
INTRAMUSCULAR | Status: AC
  Filled 2017-07-07: qty 0.5

## 2017-07-07 MED ORDER — BUPIVACAINE HCL (PF) 0.5 % IJ SOLN
INTRAMUSCULAR | Status: AC
  Filled 2017-07-07: qty 30

## 2017-07-07 MED ORDER — LIDOCAINE HCL (CARDIAC) 20 MG/ML IV SOLN
INTRAVENOUS | Status: DC | PRN
  Administered 2017-07-07: 80 mg via INTRAVENOUS

## 2017-07-07 MED ORDER — LACTATED RINGERS IV SOLN: INTRAVENOUS | Status: DC

## 2017-07-07 MED ORDER — HYDROMORPHONE HCL 1 MG/ML IJ SOLN
0.2500 mg | INTRAMUSCULAR | Status: DC | PRN
  Administered 2017-07-07 (×4): 0.5 mg via INTRAVENOUS

## 2017-07-07 MED ORDER — ONDANSETRON HCL 4 MG/2ML IJ SOLN
INTRAMUSCULAR | Status: DC | PRN
  Administered 2017-07-07: 4 mg via INTRAVENOUS

## 2017-07-07 MED ORDER — CEFAZOLIN SODIUM-DEXTROSE 2-4 GM/100ML-% IV SOLN
2.0000 g | INTRAVENOUS | Status: AC
  Administered 2017-07-07: 2 g via INTRAVENOUS

## 2017-07-07 MED ORDER — ACETAMINOPHEN 10 MG/ML IV SOLN
1000.0000 mg | Freq: Once | INTRAVENOUS | Status: AC
  Administered 2017-07-07: 1000 mg via INTRAVENOUS

## 2017-07-07 MED ORDER — DEXAMETHASONE SODIUM PHOSPHATE 10 MG/ML IJ SOLN
INTRAMUSCULAR | Status: DC | PRN
  Administered 2017-07-07: 10 mg via INTRAVENOUS

## 2017-07-07 MED ORDER — CEFAZOLIN SODIUM-DEXTROSE 2-4 GM/100ML-% IV SOLN
INTRAVENOUS | Status: AC
  Filled 2017-07-07: qty 100

## 2017-07-07 MED ORDER — PHENYLEPHRINE HCL 10 MG/ML IJ SOLN
INTRAMUSCULAR | Status: DC | PRN
  Administered 2017-07-07: 80 ug via INTRAVENOUS

## 2017-07-07 MED ORDER — ONDANSETRON HCL 4 MG PO TABS: 4.0000 mg | ORAL_TABLET | Freq: Three times a day (TID) | ORAL | 0 refills | Status: DC | PRN

## 2017-07-07 MED ORDER — KETOROLAC TROMETHAMINE 30 MG/ML IJ SOLN
30.0000 mg | Freq: Once | INTRAMUSCULAR | Status: AC
  Administered 2017-07-07: 30 mg via INTRAVENOUS

## 2017-07-07 MED ORDER — FENTANYL CITRATE (PF) 100 MCG/2ML IJ SOLN
50.0000 ug | INTRAMUSCULAR | Status: AC | PRN
  Administered 2017-07-07 (×2): 50 ug via INTRAVENOUS
  Administered 2017-07-07: 25 ug via INTRAVENOUS
  Administered 2017-07-07: 50 ug via INTRAVENOUS
  Administered 2017-07-07: 25 ug via INTRAVENOUS
  Administered 2017-07-07: 50 ug via INTRAVENOUS

## 2017-07-07 MED ORDER — MIDAZOLAM HCL 2 MG/2ML IJ SOLN
INTRAMUSCULAR | Status: AC
  Filled 2017-07-07: qty 2

## 2017-07-07 MED ORDER — DEXAMETHASONE SODIUM PHOSPHATE 10 MG/ML IJ SOLN
INTRAMUSCULAR | Status: AC
  Filled 2017-07-07: qty 1

## 2017-07-07 MED ORDER — CALCIUM CARBONATE-VITAMIN D 500-200 MG-UNIT PO TABS: 1.0000 | ORAL_TABLET | Freq: Three times a day (TID) | ORAL | 12 refills | Status: DC

## 2017-07-07 MED ORDER — BUPIVACAINE HCL (PF) 0.25 % IJ SOLN
INTRAMUSCULAR | Status: DC | PRN
  Administered 2017-07-07: 10 mL

## 2017-07-07 MED ORDER — OXYCODONE HCL 5 MG/5ML PO SOLN: 5.0000 mg | Freq: Once | ORAL | Status: DC | PRN

## 2017-07-07 MED ORDER — SENNOSIDES-DOCUSATE SODIUM 8.6-50 MG PO TABS: 1.0000 | ORAL_TABLET | Freq: Every evening | ORAL | 1 refills | Status: DC | PRN

## 2017-07-07 MED ORDER — LACTATED RINGERS IV SOLN
INTRAVENOUS | Status: DC
  Administered 2017-07-07 (×2): via INTRAVENOUS

## 2017-07-07 SURGICAL SUPPLY — 109 items
BANDAGE ACE 3X5.8 VEL STRL LF (GAUZE/BANDAGES/DRESSINGS) ×3 IMPLANT
BANDAGE ACE 4X5 VEL STRL LF (GAUZE/BANDAGES/DRESSINGS) ×3 IMPLANT
BIT DRILL 2.8X5 QR DISP (BIT) ×2 IMPLANT
BIT DRILL QUICK RELEASE 3.5MM (BIT) IMPLANT
BLADE AVERAGE 25MMX9MM (BLADE)
BLADE AVERAGE 25X9 (BLADE) ×1 IMPLANT
BLADE CUDA 2.0 (BLADE) IMPLANT
BLADE EAR TYMPAN 2.5 60D BEAV (BLADE) IMPLANT
BLADE MINI RND TIP GREEN BEAV (BLADE) ×2 IMPLANT
BLADE SAW OSTEOTOMY (BLADE) ×2 IMPLANT
BLADE SURG 15 STRL LF DISP TIS (BLADE) ×2 IMPLANT
BLADE SURG 15 STRL SS (BLADE) ×6
BNDG CMPR 9X4 STRL LF SNTH (GAUZE/BANDAGES/DRESSINGS) ×1
BNDG ESMARK 4X9 LF (GAUZE/BANDAGES/DRESSINGS) ×2 IMPLANT
BNDG GAUZE ELAST 4 BULKY (GAUZE/BANDAGES/DRESSINGS) ×3 IMPLANT
BRUSH SCRUB SURG 4.25 DISP (MISCELLANEOUS) ×2 IMPLANT
BUR CUDA 2.9 (BURR) IMPLANT
BUR CUDA 2.9MM (BURR)
BUR FULL RADIUS 2.0 (BURR) IMPLANT
BUR FULL RADIUS 2.0MM (BURR)
BUR FULL RADIUS 2.9 (BURR) IMPLANT
BUR FULL RADIUS 2.9MM (BURR)
BUR GATOR 2.9 (BURR) ×1 IMPLANT
BUR GATOR 2.9MM (BURR) ×1
BUR SPHERICAL 2.9 (BURR) IMPLANT
BUR SPHERICAL 2.9MM (BURR)
CANISTER SUCT 1200ML W/VALVE (MISCELLANEOUS) ×4 IMPLANT
CLOSURE WOUND 1/2 X4 (GAUZE/BANDAGES/DRESSINGS)
CLOSURE WOUND 1/4X4 (GAUZE/BANDAGES/DRESSINGS)
CORD BIPOLAR FORCEPS 12FT (ELECTRODE) ×2 IMPLANT
COVER BACK TABLE 60X90IN (DRAPES) ×3 IMPLANT
CUFF TOURNIQUET SINGLE 18IN (TOURNIQUET CUFF) ×1 IMPLANT
CUFF TOURNIQUET SINGLE 24IN (TOURNIQUET CUFF) ×2 IMPLANT
DRAPE EXTREMITY T 121X128X90 (DRAPE) ×3 IMPLANT
DRAPE IMP U-DRAPE 54X76 (DRAPES) ×4 IMPLANT
DRAPE OEC MINIVIEW 54X84 (DRAPES) ×2 IMPLANT
DRAPE SURG 17X23 STRL (DRAPES) ×3 IMPLANT
DRILL QUICK RELEASE 3.5MM (BIT) ×3
ELECT SMALL JOINT 90D BASC (ELECTRODE) IMPLANT
GAUZE SPONGE 4X4 12PLY STRL (GAUZE/BANDAGES/DRESSINGS) ×3 IMPLANT
GAUZE XEROFORM 1X8 LF (GAUZE/BANDAGES/DRESSINGS) ×3 IMPLANT
GLOVE BIOGEL PI IND STRL 7.0 (GLOVE) ×1 IMPLANT
GLOVE BIOGEL PI INDICATOR 7.0 (GLOVE) ×6
GLOVE ECLIPSE 6.5 STRL STRAW (GLOVE) ×2 IMPLANT
GLOVE ECLIPSE 7.0 STRL STRAW (GLOVE) ×3 IMPLANT
GLOVE NEODERM STRL 7.5 LF PF (GLOVE) ×1 IMPLANT
GLOVE SKINSENSE NS SZ7.5 (GLOVE) ×2
GLOVE SKINSENSE STRL SZ7.5 (GLOVE) IMPLANT
GLOVE SURG NEODERM 7.5  LF PF (GLOVE)
GLOVE SURG SYN 7.5  E (GLOVE) ×2
GLOVE SURG SYN 7.5 E (GLOVE) ×1 IMPLANT
GLOVE SURG SYN 7.5 PF PI (GLOVE) ×1 IMPLANT
GOWN STRL REIN XL XLG (GOWN DISPOSABLE) ×3 IMPLANT
GOWN STRL REUS W/ TWL LRG LVL3 (GOWN DISPOSABLE) ×1 IMPLANT
GOWN STRL REUS W/ TWL XL LVL3 (GOWN DISPOSABLE) ×1 IMPLANT
GOWN STRL REUS W/TWL LRG LVL3 (GOWN DISPOSABLE) ×3
GOWN STRL REUS W/TWL XL LVL3 (GOWN DISPOSABLE) ×3
GUIDEWIRE ORTHO 0.054X6 (WIRE) ×4 IMPLANT
IV NS IRRIG 3000ML ARTHROMATIC (IV SOLUTION) ×2 IMPLANT
IV SET EXTENSION GRAVITY 40 LF (IV SETS) ×3 IMPLANT
MANIFOLD NEPTUNE II (INSTRUMENTS) ×3 IMPLANT
NDL EPIDURAL TUOHY 20GX3.5 (NEEDLE) IMPLANT
NDL SAFETY ECLIPSE 18X1.5 (NEEDLE) ×1 IMPLANT
NDL SPNL 18GX3.5 QUINCKE PK (NEEDLE) IMPLANT
NEEDLE HYPO 18GX1.5 SHARP (NEEDLE) ×3
NEEDLE HYPO 22GX1.5 SAFETY (NEEDLE) ×3 IMPLANT
NEEDLE SPNL 18GX3.5 QUINCKE PK (NEEDLE) IMPLANT
NEEDLE TUOHY 20GX3.5 (NEEDLE) IMPLANT
NS IRRIG 1000ML POUR BTL (IV SOLUTION) ×2 IMPLANT
PACK BASIN DAY SURGERY FS (CUSTOM PROCEDURE TRAY) ×3 IMPLANT
PAD CAST 3X4 CTTN HI CHSV (CAST SUPPLIES) ×1 IMPLANT
PADDING CAST ABS 3INX4YD NS (CAST SUPPLIES) ×2
PADDING CAST ABS 4INX4YD NS (CAST SUPPLIES) ×2
PADDING CAST ABS COTTON 3X4 (CAST SUPPLIES) ×1 IMPLANT
PADDING CAST ABS COTTON 4X4 ST (CAST SUPPLIES) ×1 IMPLANT
PADDING CAST COTTON 3X4 STRL (CAST SUPPLIES) ×3
PLATE ULNAR SHORTENING (Plate) ×2 IMPLANT
PROBE BIPOLAR ARTHRO 85MM 30D (MISCELLANEOUS) IMPLANT
ROUTER HOODED VORTEX 2.9MM (BLADE) IMPLANT
SCREW HEXALOBE NON-LOCK 3.5X14 (Screw) ×2 IMPLANT
SCREW HEXALOBE NON-LOCK 3.5X16 (Screw) ×2 IMPLANT
SCREW NONLOCK HEX 3.5X12 (Screw) ×10 IMPLANT
SET SM JOINT TUBING/CANN (CANNULA) IMPLANT
SLEEVE SCD COMPRESS KNEE MED (MISCELLANEOUS) ×3 IMPLANT
SPLINT FIBERGLASS 4X30 (CAST SUPPLIES) ×2 IMPLANT
SPLINT PLASTER CAST XFAST 3X15 (CAST SUPPLIES) ×30 IMPLANT
SPLINT PLASTER XTRA FASTSET 3X (CAST SUPPLIES)
STOCKINETTE 4X48 STRL (DRAPES) ×3 IMPLANT
STRIP CLOSURE SKIN 1/2X4 (GAUZE/BANDAGES/DRESSINGS) IMPLANT
STRIP CLOSURE SKIN 1/4X4 (GAUZE/BANDAGES/DRESSINGS) IMPLANT
SUCTION FRAZIER HANDLE 10FR (MISCELLANEOUS) ×2
SUCTION TUBE FRAZIER 10FR DISP (MISCELLANEOUS) IMPLANT
SUT ETHILON 3 0 PS 1 (SUTURE) ×6 IMPLANT
SUT ETHILON 4 0 PS 2 18 (SUTURE) IMPLANT
SUT ETHILON 5 0 P 3 18 (SUTURE)
SUT MERSILENE 4 0 P 3 (SUTURE) IMPLANT
SUT NYLON ETHILON 5-0 P-3 1X18 (SUTURE) IMPLANT
SUT PDS AB 2-0 CT2 27 (SUTURE) IMPLANT
SUT STEEL 4 0 (SUTURE) IMPLANT
SUT VIC AB 2-0 PS2 27 (SUTURE) ×4 IMPLANT
SUT VICRYL 4-0 PS2 18IN ABS (SUTURE) IMPLANT
SYR BULB 3OZ (MISCELLANEOUS) ×3 IMPLANT
SYR CONTROL 10ML LL (SYRINGE) ×3 IMPLANT
TRAY DSU PREP LF (CUSTOM PROCEDURE TRAY) ×2 IMPLANT
TUBE CONNECTING 20'X1/4 (TUBING) ×1
TUBE CONNECTING 20X1/4 (TUBING) ×1 IMPLANT
TUBING ARTHRO INFLOW-ONLY STRL (TUBING) ×2 IMPLANT
UNDERPAD 30X30 (UNDERPADS AND DIAPERS) ×3 IMPLANT
WATER STERILE IRR 1000ML POUR (IV SOLUTION) ×3 IMPLANT

## 2017-07-07 NOTE — Op Note (Signed)
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  PREOPERATIVE DIAGNOSIS: Right wrist ulnar impaction syndrome, wrist synovitis  POSTOPERATIVE DIAGNOSIS: Same.  PROCEDURE:  1.  Right wrist arthroscopy with synovectomy and debridement of TFCC, lunate, scaphoid radial styloid joint 2.  Right ulnar shortening osteotomy  SURGEON: N. Eduard Roux, M.D.  ASSIST: Ciro Backer [place], [place]; necessary for the timely completion of procedure and due to complexity of procedure.  ANESTHESIA:  general  IV FLUIDS AND URINE: See anesthesia.  ESTIMATED BLOOD LOSS: Minimal mL.  IMPLANTS: Acumed  DRAINS: None  COMPLICATIONS: None.  DESCRIPTION OF PROCEDURE: The patient was brought to the operating room and placed supine on the operating table.  The patient had been signed prior to the procedure and this was documented. The patient had the anesthesia placed by the anesthesiologist.  A time-out was performed to confirm that this was the correct patient, site, side and location. The patient did receive antibiotics prior to the incision and was re-dosed during the procedure as needed at indicated intervals.  A tourniquet was placed.  The patient had the operative extremity prepped and draped in the standard surgical fashion.    We first began with the wrist arthroscopy.  After suspending the upper extremity and the wrist arthroscopy tower with 10 pounds of longitudinal traction we established the 3-4 portal using blunt dissection.  After this was done we then created the 6R portal under direct visualization and blunt dissection.  Once in the wrist joint we then performed a diagnostic wrist arthroscopy.  We were able to identify steroid deposits within the radiocarpal joint which was irrigated.  We did find a significant amount of  synovitis within the entire radiocarpal joint which was excised using an oscillating shaver.  There was degenerative changes of the radial styloid and scaphoid joint.  Chondroplasty was performed back to a stable border.  We then continued our arthroscopy and ulnar direction.  The SL ligament was grossly intact without any evidence of full-thickness tears.  The lunate had extensive chondromalacia on the proximal pole.  Chondroplasty was performed back to stable border.  The TFCC was then evaluated and had no frank tears other than some mild degenerative changes.  This was also debrided using oscillating shaver.  The DRUJ was stable to exam.  Excess fluid was then removed from the wrist joint.  We then turned our attention to the ulnar shortening osteotomy.  A separate incision was made over the ulnar border of the ulnar shaft.  Subcutaneous approach was used.  Dissection was performed dorsally.  The ECU muscle belly was elevated off of the dorsal aspect of the ulna.  Once we had exposure replace the Acumed plate on the dorsal aspect of the ulna at the appropriate position.  K wires were used to provisionally fix the plate.  We then placed a nonlocking screw distal to the planned osteotomy site with excellent purchase.  A provisional peg was placed in the proximal portion of the plate.  The osteotomy cutting guide was then applied to the plate and 2 mm of the ulna was resected using the guide.  We then gain compression across the fracture site using the provided instrumentation.  We then drilled a compression screw eccentrically through the compression hole and we are able to visualize compression of the fracture with tightening  of the screw.  We then placed a lag screw through the plate across the osteotomy site.  We then placed 2 more nonlocking screws distal to the fracture and 2 more nonlocking screws proximal to the fracture each with excellent purchase.  Final x-rays were taken which showed good compression  across the osteotomy site.  Wrist x-ray showed that we were able to achieve approximately a -1 negative variance of the ulna the wounds were then thoroughly irrigated and closed in layered fashion using 2-0 Vicryl and 3-0 nylon.. The wrist was placed in a splint.  Patient tolerated procedure well and no immediate complications.  POSTOPERATIVE PLAN: Patient will be nonweightbearing to the right upper extremity.  We will see her back in 2 weeks for suture  N. Eduard Roux, MD [place] 9:11 AM

## 2017-07-07 NOTE — Discharge Instructions (Signed)
Postoperative instructions:  Weightbearing instructions: non weight bearing  Dressing instructions: Keep your dressing and/or splint clean and dry at all times.  It will be removed at your first post-operative appointment.  Your stitches and/or staples will be removed at this visit.   Incision instructions:  Do not soak your incision for 3 weeks after surgery.  If the incision gets wet, pat dry and do not scrub the incision.  Pain control:  You have been given a prescription to be taken as directed for post-operative pain control.  In addition, elevate the operative extremity above the heart at all times to prevent swelling and throbbing pain.  Take over-the-counter Colace, 100mg  by mouth twice a day while taking narcotic pain medications to help prevent constipation. DO NOT take Tylenol until after 5PM!! DO NOT take Advil until after 5PM!! Follow up appointments: 1) 10-14 days for suture removal and wound check. 2) Katherine [name] as scheduled.   -------------------------------------------------------------------------------------------------------------  After Surgery Pain Control:  After your surgery, post-surgical discomfort or pain is likely. This discomfort can last several days to a few weeks. At certain times of the day your discomfort may be more intense.  Did you receive a nerve block?  A nerve block can provide pain relief for one hour to two days after your surgery. As long as the nerve block is working, you will experience little or no sensation in the area the surgeon operated on.  As the nerve block wears off, you will begin to experience pain or discomfort. It is very important that you begin taking your prescribed pain medication before the nerve block fully wears off. Treating your pain at the first sign of the block wearing off will ensure your pain is better controlled and more tolerable when full-sensation returns. Do not wait until the pain is intolerable, as the medicine will be  less effective. It is better to treat pain in advance than to try and catch up.  General Anesthesia:  If you did not receive a nerve block during your surgery, you will need to start taking your pain medication shortly after your surgery and should continue to do so as prescribed by your surgeon.  Pain Medication:  Most commonly we prescribe Vicodin and Percocet for post-operative pain. Both of these medications contain a combination of acetaminophen (Tylenol) and a narcotic to help control pain.   It takes between 30 and 45 minutes before pain medication starts to work. It is important to take your medication before your pain level gets too intense.   Nausea is a common side effect of many pain medications. You will want to eat something before taking your pain medicine to help prevent nausea.   If you are taking a prescription pain medication that contains acetaminophen, we recommend that you do not take additional over the counter acetaminophen (Tylenol).  Other pain relieving options:   Using a cold pack to ice the affected area a few times a day (15 to 20 minutes at a time) can help to relieve pain, reduce swelling and bruising.   Elevation of the affected area can also help to reduce pain and swelling.      Call your surgeon if you experience:   1.  Fever over 101.0. 2.  Inability to urinate. 3.  Nausea and/or vomiting. 4.  Extreme swelling or bruising at the surgical site. 5.  Continued bleeding from the incision. 6.  Increased pain, redness or drainage from the incision. 7.  Problems related to your  pain medication. 8.  Any problems and/or concerns    Post Anesthesia Home Care Instructions  Activity: Get plenty of rest for the remainder of the day. A responsible individual must stay with you for 24 hours following the procedure.  For the next 24 hours, DO NOT: -Drive a car -Paediatric nurse -Drink alcoholic beverages -Take any medication unless instructed by your  physician -Make any legal decisions or sign important papers.  Meals: Start with liquid foods such as gelatin or soup. Progress to regular foods as tolerated. Avoid greasy, spicy, heavy foods. If nausea and/or vomiting occur, drink only clear liquids until the nausea and/or vomiting subsides. Call your physician if vomiting continues.  Special Instructions/Symptoms: Your throat may feel dry or sore from the anesthesia or the breathing tube placed in your throat during surgery. If this causes discomfort, gargle with warm salt water. The discomfort should disappear within 24 hours.  If you had a scopolamine patch placed behind your ear for the management of post- operative nausea and/or vomiting:  1. The medication in the patch is effective for 72 hours, after which it should be removed.  Wrap patch in a tissue and discard in the trash. Wash hands thoroughly with soap and water. 2. You may remove the patch earlier than 72 hours if you experience unpleasant side effects which may include dry mouth, dizziness or visual disturbances. 3. Avoid touching the patch. Wash your hands with soap and water after contact with the patch.

## 2017-07-07 NOTE — Transfer of Care (Signed)
Immediate Anesthesia Transfer of Care Note  Patient: Katherine [name]  Procedure(s) Performed: RIGHT WRIST ARTHROSCOPY WITH ULNAR SHORTENING OSTEOTOMY (Right Wrist)  Patient Location: PACU  Anesthesia Type:General  Level of Consciousness: sedated  Airway & Oxygen Therapy: Patient Spontanous Breathing and Patient connected to face mask oxygen  Post-op Assessment: Report given to RN and Post -op Vital signs reviewed and stable  Post vital signs: Reviewed and stable  Last Vitals:  Vitals:   07/07/17 0637 07/07/17 0702  BP: (!) 153/99 118/87  Pulse: (!) 111 100  Temp: 36.9 C   SpO2: 99%     Last Pain:  Vitals:   07/07/17 0637  TempSrc: Oral  PainSc: 8       Patients Stated Pain Goal: 3 (03/09/12 1438)  Complications: No apparent anesthesia complications

## 2017-07-07 NOTE — Anesthesia Procedure Notes (Signed)
Procedure Name: LMA Insertion Date/Time: 07/07/2017 7:47 AM Performed by: Maryella Shivers, CRNA Pre-anesthesia Checklist: Patient identified, Emergency Drugs available, Suction available and Patient being monitored Patient Re-evaluated:Patient Re-evaluated prior to induction Oxygen Delivery Method: Circle system utilized Preoxygenation: Pre-oxygenation with 100% oxygen Induction Type: IV induction Ventilation: Mask ventilation without difficulty LMA: LMA inserted LMA Size: 4.0 Number of attempts: 1 Airway Equipment and Method: Bite block Placement Confirmation: positive ETCO2 Tube secured with: Tape Dental Injury: Teeth and Oropharynx as per pre-operative assessment

## 2017-07-07 NOTE — H&P (Signed)
PREOPERATIVE H&P  Chief Complaint: right triangular fibrocartilage complex tear, ulnar impaction syndrome  HPI: Katherine [name] is a 67 y.o. female who presents for surgical treatment of right triangular fibrocartilage complex tear, ulnar impaction syndrome.  She denies any changes in medical history.  Past Medical History:  Diagnosis Date  . Dental crowns present   . History of kidney stones   . Hypertension    states under control with meds., has been on med. x 20 yr.  . Osteoarthritis   . Peripheral neuropathy    hands and feet  . Triangular fibrocartilage complex tear 06/2017   right  . 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 Wears partial dentures    upper and lower   Past Surgical History:  Procedure Laterality Date  . ABDOMINAL HYSTERECTOMY  1991   partial  . CARPAL TUNNEL RELEASE Right 08/09/2002  . CARPAL TUNNEL RELEASE Left 11/03/2006  . COLONOSCOPY WITH PROPOFOL  11/24/2011  . CYSTOSCOPY WITH RETROGRADE PYELOGRAM, URETEROSCOPY AND STENT PLACEMENT Left 12/18/2014   Procedure: CYSTOSCOPY WITH RETROGRADE PYELOGRAM, URETEROSCOPY AND STENT PLACEMENT left ureter;  Surgeon: Raynelle Bring, MD;  Location: [facility];  Service: Urology;  Laterality: Left;  . DILATION AND CURETTAGE OF UTERUS    . ELBOW ARTHROSCOPY Right   . EXTRACORPOREAL SHOCK WAVE LITHOTRIPSY Left 03/08/2017   Procedure: LEFT EXTRACORPOREAL SHOCK WAVE LITHOTRIPSY (ESWL);  Surgeon: Raynelle Bring, MD;  Location: [facility];  Service: Urology;  Laterality: Left;  . HOLMIUM LASER APPLICATION Left 06/19/5186   Procedure: HOLMIUM LASER APPLICATION left ureter ;  Surgeon: Raynelle Bring, MD;  Location: [facility];  Service: Urology;  Laterality: Left;  . PARATHYROIDECTOMY Right 05/03/2015   Procedure: RIGHT SUPERIOR PARATHYROIDECTOMY;  Surgeon: Armandina Gemma, MD;  Location: [facility];  Service: General;  Laterality: Right;  . PATELLA REALIGNMENT Bilateral   . REPLACEMENT TOTAL KNEE BILATERAL    . ROTATOR CUFF REPAIR Right    x2  . SHOULDER ARTHROSCOPY Right    x 2  . SHOULDER ARTHROSCOPY W/ ROTATOR CUFF REPAIR Left 04/26/2002  . TONSILLECTOMY    . TOTAL KNEE ARTHROPLASTY Left 09/10/2003  . TOTAL KNEE ARTHROPLASTY Right 11/10/2004  . TRIGGER FINGER RELEASE Right 08/06/2008   ring finger  . TRIGGER FINGER RELEASE     multiple - right thumb, left index/long/ring/thumb  . WRIST ARTHROSCOPY Left 06/16/2000   Social History   Socioeconomic History  . Marital status: Single    Spouse name: None  . Number of children: 1  . Years of education: None  . Highest education level: None  Social Needs  . Financial resource strain: None  . Food insecurity - worry: None  . Food insecurity - inability: None  . Transportation needs - medical: None  . Transportation needs - non-medical: None  Occupational History    Employer: [employer]  Tobacco Use  . Smoking status: Never Smoker  . Smokeless tobacco: Never Used  Substance and Sexual Activity  . Alcohol use: No  . Drug use: No  . Sexual activity: No  Other Topics Concern  . None  Social History Narrative   Patient is single, has 1 child   Patient is right handed   Caffeine consumption is 0   Family History  Problem Relation Age of Onset  . Stroke Mother   . Cancer Mother 99       bone cancer  . Diabetes Sister    Allergies  Allergen Reactions  . Erythromycin Other (See Comments)  CHEST PAIN   Prior to Admission medications   Medication Sig Start Date End Date Taking? Authorizing Provider  b complex vitamins tablet Take 1 tablet by mouth daily.   Yes [provider]  cholecalciferol (VITAMIN D) 1000 units tablet Take 2,000 Units by mouth daily.   Yes [provider]  hydrochlorothiazide (HYDRODIURIL) 12.5 MG tablet Take 12.5 mg by mouth daily.   Yes [provider]  L-Methylfolate-Algae-B12-B6 Glade Stanford) 3-90.314-2-35 MG CAPS Take by mouth 2 (two) times daily.   Yes [provider]  lidocaine-prilocaine (EMLA)  cream Apply four times per day as needed. 03/25/17  Yes Marletta Lor, MD  lisinopril (PRINIVIL,ZESTRIL) 10 MG tablet TAKE 1 TABLET BY MOUTH  DAILY 06/22/17  Yes Marletta Lor, MD  Multiple Vitamin (MULTIVITAMIN) tablet Take 1 tablet by mouth daily.   Yes [provider]  nortriptyline (PAMELOR) 25 MG capsule Take 3 capsules (75 mg total) by mouth at bedtime. 11/23/16  Yes Marletta Lor, MD  pregabalin (LYRICA) 100 MG capsule Take 1 capsule (100 mg total) 2 (two) times daily by mouth. Please call [phone] to schedule an appt. 03/25/17  Yes Marletta Lor, MD  traMADol Veatrice Bourbon) 50 MG tablet Take one to two tablets by mouth every 6 hours if needed for pain, Prescribed by Neurology 12/31/15  Yes Marcial Pacas, MD  gabapentin (NEURONTIN) 300 MG capsule Take 300 mg by mouth 3 (three) times daily.    07/30/11  [provider]     Positive ROS: All other systems have been reviewed and were otherwise negative with the exception of those mentioned in the HPI and as above.  Physical Exam: General: Alert, no acute distress Cardiovascular: No pedal edema Respiratory: No cyanosis, no use of accessory musculature GI: abdomen soft Skin: No lesions in the area of chief complaint Neurologic: Sensation intact distally Psychiatric: Patient is competent for consent with normal mood and affect Lymphatic: no lymphedema  MUSCULOSKELETAL: exam stable  Assessment: right triangular fibrocartilage complex tear, ulnar impaction syndrome  Plan: Plan for Procedure(s): RIGHT WRIST ARTHROSCOPY WITH ULNAR SHORTENING OSTEOTOMY  The risks benefits and alternatives were discussed with the patient including but not limited to the risks of nonoperative treatment, versus surgical intervention including infection, bleeding, nerve injury,  blood clots, cardiopulmonary complications, morbidity, mortality, among others, and they were willing to proceed.   Eduard Roux,  MD   07/07/2017 7:33 AM

## 2017-07-07 NOTE — Anesthesia Postprocedure Evaluation (Signed)
Anesthesia Post Note  Patient: Katherine [name]  Procedure(s) Performed: RIGHT WRIST ARTHROSCOPY WITH ULNAR SHORTENING OSTEOTOMY (Right Wrist)     Patient location during evaluation: PACU Anesthesia Type: General Level of consciousness: awake and alert Pain management: pain level controlled Vital Signs Assessment: post-procedure vital signs reviewed and stable Respiratory status: spontaneous breathing, nonlabored ventilation, respiratory function stable and patient connected to nasal cannula oxygen Cardiovascular status: blood pressure returned to baseline and stable Postop Assessment: no apparent nausea or vomiting Anesthetic complications: no    Last Vitals:  Vitals:   07/07/17 1115 07/07/17 1200  BP: 113/62 114/73  Pulse: (!) 101 98  Resp: (!) 31 (!) 22  Temp:  37.1 C  SpO2: 96% 98%    Last Pain:  Vitals:   07/07/17 1200  TempSrc: Oral  PainSc: 3                  Charleene Callegari P Kiing Deakin

## 2017-07-08 ENCOUNTER — Encounter (HOSPITAL_BASED_OUTPATIENT_CLINIC_OR_DEPARTMENT_OTHER): Payer: Self-pay | Admitting: Orthopaedic Surgery

## 2017-07-16 ENCOUNTER — Ambulatory Visit (INDEPENDENT_AMBULATORY_CARE_PROVIDER_SITE_OTHER): Payer: Medicare Other | Admitting: Orthopaedic Surgery

## 2017-07-16 ENCOUNTER — Encounter (INDEPENDENT_AMBULATORY_CARE_PROVIDER_SITE_OTHER): Payer: Self-pay | Admitting: Orthopaedic Surgery

## 2017-07-16 DIAGNOSIS — M25831 Other specified joint disorders, right wrist: Secondary | ICD-10-CM

## 2017-07-16 NOTE — Progress Notes (Signed)
Post-Op Visit Note   Patient: Katherine [name]           Date of Birth: May 08, 1951           MRN: [id number] Visit Date: 07/16/2017 PCP: Marletta Lor, MD   Assessment & Plan:  Chief Complaint:  Chief Complaint  Patient presents with  . Right Wrist - Pain, Routine Post Op   Visit Diagnoses:  1. Ulnar impaction syndrome, right     Plan: Shelaine is a pleasant 67 year old female who presents to our clinic today 9 days status post right wrist arthroscopic debridement with ulnar shortening osteotomy, date of surgery 07/07/2017.  Doing well.  No fevers chills or any other systemic symptoms.  Minimal pain.  She has been elevating for swelling.  Examination of her right wrist reveals well-healing surgical incision with nylon sutures in place.  No evidence of infection or cellulitis.  She does have moderate swelling.  She is rest intact distally.  Today, we will put her in a removable Velcro splint.  She will follow-up with [place] next week for suture removal.  She will need xrays of her forearm on return.  Follow-Up Instructions: Return for one week for suture removal.   Orders:  No orders of the defined types were placed in this encounter.  No orders of the defined types were placed in this encounter.   Imaging: No results found.  PMFS History: Patient Active Problem List   Diagnosis Date Noted  . 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 Pain in right wrist 06/01/2017  . Trigger finger, right index finger 09/28/2016  . Impaired glucose tolerance 09/09/2015  . Hyperparathyroidism ([place]) 05/26/2013  . Hereditary and idiopathic peripheral neuropathy 12/29/2012  . Obese   . Menopausal symptoms 11/10/2011  . DIVERTICULOSIS, COLON 11/02/2007  . Dyslipidemia 03/09/2006  . Essential hypertension 03/09/2006  . NEPHROLITHIASIS, HX OF 03/09/2006   Past Medical History:  Diagnosis Date  . Dental crowns present   . History of kidney  stones   . Hypertension    states under control with meds., has been on med. x 20 yr.  . Osteoarthritis   . Peripheral neuropathy    hands and feet  . Triangular fibrocartilage complex tear 06/2017   right  . 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 Wears partial dentures    upper and lower    Family History  Problem Relation Age of Onset  . Stroke Mother   . Cancer Mother 27       bone cancer  . Diabetes Sister     Past Surgical History:  Procedure Laterality Date  . ABDOMINAL HYSTERECTOMY  1991   partial  . CARPAL TUNNEL RELEASE Right 08/09/2002  . CARPAL TUNNEL RELEASE Left 11/03/2006  . COLONOSCOPY WITH PROPOFOL  11/24/2011  . CYSTOSCOPY WITH RETROGRADE PYELOGRAM, URETEROSCOPY AND STENT PLACEMENT Left 12/18/2014   Procedure: CYSTOSCOPY WITH RETROGRADE PYELOGRAM, URETEROSCOPY AND STENT PLACEMENT left ureter;  Surgeon: Raynelle Bring, MD;  Location: [facility];  Service: Urology;  Laterality: Left;  . DILATION AND CURETTAGE OF UTERUS    . ELBOW ARTHROSCOPY Right   . EXTRACORPOREAL SHOCK WAVE LITHOTRIPSY Left 03/08/2017   Procedure: LEFT EXTRACORPOREAL SHOCK WAVE LITHOTRIPSY (ESWL);  Surgeon: Raynelle Bring, MD;  Location: [facility];  Service: Urology;  Laterality: Left;  . HOLMIUM LASER APPLICATION Left 4/0/9811   Procedure: HOLMIUM LASER APPLICATION left ureter ;  Surgeon: Raynelle Bring, MD;  Location: [facility];  Service:  Urology;  Laterality: Left;  . PARATHYROIDECTOMY Right 05/03/2015   Procedure: RIGHT SUPERIOR PARATHYROIDECTOMY;  Surgeon: Armandina Gemma, MD;  Location: [facility];  Service: General;  Laterality: Right;  . PATELLA REALIGNMENT Bilateral   . REPLACEMENT TOTAL KNEE BILATERAL    . ROTATOR CUFF REPAIR Right    x2  . SHOULDER ARTHROSCOPY Right    x 2  . SHOULDER ARTHROSCOPY W/ ROTATOR CUFF REPAIR Left 04/26/2002  . TONSILLECTOMY    . TOTAL KNEE ARTHROPLASTY Left 09/10/2003  . TOTAL KNEE ARTHROPLASTY Right 11/10/2004  . TRIGGER FINGER RELEASE Right 08/06/2008   ring  finger  . TRIGGER FINGER RELEASE     multiple - right thumb, left index/long/ring/thumb  . WRIST ARTHROSCOPY Left 06/16/2000  . WRIST ARTHROSCOPY WITH ULNA SHORTENING Right 07/07/2017   Procedure: RIGHT WRIST ARTHROSCOPY WITH ULNAR SHORTENING OSTEOTOMY;  Surgeon: Leandrew Koyanagi, MD;  Location: [facility];  Service: Orthopedics;  Laterality: Right;   Social History   Occupational History    Employer: [employer]  Tobacco Use  . Smoking status: Never Smoker  . Smokeless tobacco: Never Used  Substance and Sexual Activity  . Alcohol use: No  . Drug use: No  . Sexual activity: No

## 2017-07-23 ENCOUNTER — Ambulatory Visit (INDEPENDENT_AMBULATORY_CARE_PROVIDER_SITE_OTHER): Payer: Medicare Other | Admitting: Physician Assistant

## 2017-07-23 ENCOUNTER — Encounter (HOSPITAL_BASED_OUTPATIENT_CLINIC_OR_DEPARTMENT_OTHER): Payer: Self-pay | Admitting: *Deleted

## 2017-07-23 ENCOUNTER — Ambulatory Visit (INDEPENDENT_AMBULATORY_CARE_PROVIDER_SITE_OTHER): Payer: Medicare Other

## 2017-07-23 ENCOUNTER — Other Ambulatory Visit: Payer: Self-pay

## 2017-07-23 DIAGNOSIS — M25831 Other specified joint disorders, right wrist: Secondary | ICD-10-CM

## 2017-07-23 DIAGNOSIS — M25531 Pain in right wrist: Secondary | ICD-10-CM | POA: Diagnosis not present

## 2017-07-23 NOTE — Progress Notes (Signed)
Office Visit Note   Patient: Katherine [name]           Date of Birth: Jul 15, 1950           MRN: [id number] Visit Date: 07/23/2017              Requested by: Marletta Lor, MD [place], [place] [address] PCP: Marletta Lor, MD   Assessment & Plan: Visit Diagnoses:  1. Pain in right wrist   2. Ulnar impaction syndrome, right     Plan: Findings on the x-rays today, we will need to proceed with revision ulnar shortening osteotomy.  Risks benefits and possible comp occasions reviewed.  Rehab recovery time discussed.  All questions were answered.  She will call with concerns or questions in the meantime.  Follow-Up Instructions: Return in about 2 weeks (around 08/06/2017) for po.   Orders:  Orders Placed This Encounter  Procedures  . XR Wrist 2 Views Right   No orders of the defined types were placed in this encounter.     Procedures: No procedures performed   Clinical Data: No additional findings.   Subjective: Chief Complaint  Patient presents with  . Right Wrist - Routine Post Op    HPI Katherine [name] comes in for follow-up.  16 days status post right wrist arthroscopy with ulnar shortening osteotomy, date of surgery 07/07/2017.  She has been doing well with no worsening of pain.  She has been in a Velcro splint which she states she has been wearing at all times.  No falls or any other injuries to the operative extremity.  No fevers, chills or any other systemic symptoms.     Review of Systems as detailed in HPI.  All others reviewed and are negative.   Objective: Vital Signs: There were no vitals taken for this visit.  Physical Exam well-developed well-nourished female in no acute distress.  Alert and oriented x3.  Due to  Ortho Exam  Examination of her right forearm reveals well-healing surgical incision with nylon sutures in place.  She has moderate swelling.   Specialty Comments:  No specialty comments available.  Imaging: Xr  Wrist 2 Views Right  Result Date: 07/23/2017 X-rays of the right forearm show that the plate and screws from the proximal aspect of the shortening have pulled away.    PMFS History: Patient Active Problem List   Diagnosis Date Noted  . 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 Pain in right wrist 06/01/2017  . Trigger finger, right index finger 09/28/2016  . Impaired glucose tolerance 09/09/2015  . Hyperparathyroidism ([place]) 05/26/2013  . Hereditary and idiopathic peripheral neuropathy 12/29/2012  . Obese   . Menopausal symptoms 11/10/2011  . DIVERTICULOSIS, COLON 11/02/2007  . Dyslipidemia 03/09/2006  . Essential hypertension 03/09/2006  . NEPHROLITHIASIS, HX OF 03/09/2006   Past Medical History:  Diagnosis Date  . Dental crowns present   . History of kidney stones   . Hypertension    states under control with meds., has been on med. x 20 yr.  . Osteoarthritis   . Peripheral neuropathy    hands and feet  . Triangular fibrocartilage complex tear 06/2017   right  . 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 Wears partial dentures    upper and lower    Family History  Problem Relation Age of Onset  . Stroke Mother   . Cancer Mother 32       bone  cancer  . Diabetes Sister     Past Surgical History:  Procedure Laterality Date  . ABDOMINAL HYSTERECTOMY  1991   partial  . CARPAL TUNNEL RELEASE Right 08/09/2002  . CARPAL TUNNEL RELEASE Left 11/03/2006  . COLONOSCOPY WITH PROPOFOL  11/24/2011  . CYSTOSCOPY WITH RETROGRADE PYELOGRAM, URETEROSCOPY AND STENT PLACEMENT Left 12/18/2014   Procedure: CYSTOSCOPY WITH RETROGRADE PYELOGRAM, URETEROSCOPY AND STENT PLACEMENT left ureter;  Surgeon: Raynelle Bring, MD;  Location: [facility];  Service: Urology;  Laterality: Left;  . DILATION AND CURETTAGE OF UTERUS    . ELBOW ARTHROSCOPY Right   . EXTRACORPOREAL SHOCK WAVE LITHOTRIPSY Left 03/08/2017   Procedure: LEFT EXTRACORPOREAL SHOCK WAVE  LITHOTRIPSY (ESWL);  Surgeon: Raynelle Bring, MD;  Location: [facility];  Service: Urology;  Laterality: Left;  . HOLMIUM LASER APPLICATION Left 08/14/5033   Procedure: HOLMIUM LASER APPLICATION left ureter ;  Surgeon: Raynelle Bring, MD;  Location: [facility];  Service: Urology;  Laterality: Left;  . PARATHYROIDECTOMY Right 05/03/2015   Procedure: RIGHT SUPERIOR PARATHYROIDECTOMY;  Surgeon: Armandina Gemma, MD;  Location: [facility];  Service: General;  Laterality: Right;  . PATELLA REALIGNMENT Bilateral   . REPLACEMENT TOTAL KNEE BILATERAL    . ROTATOR CUFF REPAIR Right    x2  . SHOULDER ARTHROSCOPY Right    x 2  . SHOULDER ARTHROSCOPY W/ ROTATOR CUFF REPAIR Left 04/26/2002  . TONSILLECTOMY    . TOTAL KNEE ARTHROPLASTY Left 09/10/2003  . TOTAL KNEE ARTHROPLASTY Right 11/10/2004  . TRIGGER FINGER RELEASE Right 08/06/2008   ring finger  . TRIGGER FINGER RELEASE     multiple - right thumb, left index/long/ring/thumb  . WRIST ARTHROSCOPY Left 06/16/2000  . WRIST ARTHROSCOPY WITH ULNA SHORTENING Right 07/07/2017   Procedure: RIGHT WRIST ARTHROSCOPY WITH ULNAR SHORTENING OSTEOTOMY;  Surgeon: Leandrew Koyanagi, MD;  Location: [facility];  Service: Orthopedics;  Laterality: Right;   Social History   Occupational History    Employer: [employer]  Tobacco Use  . Smoking status: Never Smoker  . Smokeless tobacco: Never Used  Substance and Sexual Activity  . Alcohol use: No  . Drug use: No  . Sexual activity: No

## 2017-07-28 ENCOUNTER — Other Ambulatory Visit: Payer: Self-pay

## 2017-07-28 ENCOUNTER — Ambulatory Visit (HOSPITAL_BASED_OUTPATIENT_CLINIC_OR_DEPARTMENT_OTHER): Payer: Medicare Other | Admitting: Anesthesiology

## 2017-07-28 ENCOUNTER — Ambulatory Visit (HOSPITAL_BASED_OUTPATIENT_CLINIC_OR_DEPARTMENT_OTHER)
Admission: RE | Admit: 2017-07-28 | Discharge: 2017-07-28 | Disposition: A | Discharge status: Home / Self Care | Payer: Medicare Other | Source: Ambulatory Visit | Attending: Orthopaedic Surgery | Admitting: Orthopaedic Surgery

## 2017-07-28 ENCOUNTER — Encounter (HOSPITAL_BASED_OUTPATIENT_CLINIC_OR_DEPARTMENT_OTHER)
Admission: RE | Disposition: A | Discharge status: Home / Self Care | Payer: Self-pay | Source: Ambulatory Visit | Attending: Orthopaedic Surgery

## 2017-07-28 ENCOUNTER — Encounter (HOSPITAL_BASED_OUTPATIENT_CLINIC_OR_DEPARTMENT_OTHER): Payer: Self-pay | Admitting: Anesthesiology

## 2017-07-28 DIAGNOSIS — Z6841 Body Mass Index (BMI) 40.0 and over, adult: Secondary | ICD-10-CM | POA: Diagnosis not present

## 2017-07-28 DIAGNOSIS — G629 Polyneuropathy, unspecified: Secondary | ICD-10-CM | POA: Insufficient documentation

## 2017-07-28 DIAGNOSIS — Z79899 Other long term (current) drug therapy: Secondary | ICD-10-CM | POA: Diagnosis not present

## 2017-07-28 DIAGNOSIS — T84192A Other mechanical complication of internal fixation device of bone of right forearm, initial encounter: Secondary | ICD-10-CM | POA: Diagnosis present

## 2017-07-28 DIAGNOSIS — Z881 Allergy status to other antibiotic agents status: Secondary | ICD-10-CM | POA: Insufficient documentation

## 2017-07-28 DIAGNOSIS — I10 Essential (primary) hypertension: Secondary | ICD-10-CM | POA: Diagnosis not present

## 2017-07-28 DIAGNOSIS — Y798 Miscellaneous orthopedic devices associated with adverse incidents, not elsewhere classified: Secondary | ICD-10-CM | POA: Diagnosis not present

## 2017-07-28 DIAGNOSIS — Z96653 Presence of artificial knee joint, bilateral: Secondary | ICD-10-CM | POA: Insufficient documentation

## 2017-07-28 DIAGNOSIS — Y929 Unspecified place or not applicable: Secondary | ICD-10-CM | POA: Insufficient documentation

## 2017-07-28 DIAGNOSIS — S52231P Displaced oblique fracture of shaft of right ulna, subsequent encounter for closed fracture with malunion: Secondary | ICD-10-CM | POA: Diagnosis not present

## 2017-07-28 DIAGNOSIS — S52233P Displaced oblique fracture of shaft of unspecified ulna, subsequent encounter for closed fracture with malunion: Secondary | ICD-10-CM

## 2017-07-28 DIAGNOSIS — Z87442 Personal history of urinary calculi: Secondary | ICD-10-CM | POA: Insufficient documentation

## 2017-07-28 HISTORY — PX: ULNA OSTEOTOMY: SHX1077

## 2017-07-28 SURGERY — SHORTENING, ULNA
Anesthesia: General | Site: Arm Lower | Laterality: Right

## 2017-07-28 MED ORDER — ACETAMINOPHEN 10 MG/ML IV SOLN
1000.0000 mg | Freq: Once | INTRAVENOUS | Status: DC | PRN
  Administered 2017-07-28: 1000 mg via INTRAVENOUS

## 2017-07-28 MED ORDER — SCOPOLAMINE 1 MG/3DAYS TD PT72: 1.0000 | MEDICATED_PATCH | Freq: Once | TRANSDERMAL | Status: DC | PRN

## 2017-07-28 MED ORDER — ONDANSETRON HCL 4 MG/2ML IJ SOLN
INTRAMUSCULAR | Status: DC | PRN
  Administered 2017-07-28: 4 mg via INTRAVENOUS

## 2017-07-28 MED ORDER — PROPOFOL 500 MG/50ML IV EMUL
INTRAVENOUS | Status: AC
  Filled 2017-07-28: qty 50

## 2017-07-28 MED ORDER — LACTATED RINGERS IV SOLN
INTRAVENOUS | Status: DC
  Administered 2017-07-28 (×2): via INTRAVENOUS

## 2017-07-28 MED ORDER — FENTANYL CITRATE (PF) 100 MCG/2ML IJ SOLN
INTRAMUSCULAR | Status: AC
  Filled 2017-07-28: qty 2

## 2017-07-28 MED ORDER — ONDANSETRON HCL 4 MG/2ML IJ SOLN
INTRAMUSCULAR | Status: AC
  Filled 2017-07-28: qty 2

## 2017-07-28 MED ORDER — CHLORHEXIDINE GLUCONATE 4 % EX LIQD: 60.0000 mL | Freq: Once | CUTANEOUS | Status: DC

## 2017-07-28 MED ORDER — MIDAZOLAM HCL 2 MG/2ML IJ SOLN
1.0000 mg | INTRAMUSCULAR | Status: DC | PRN
  Administered 2017-07-28: 2 mg via INTRAVENOUS

## 2017-07-28 MED ORDER — PROPOFOL 10 MG/ML IV BOLUS
INTRAVENOUS | Status: DC | PRN
  Administered 2017-07-28: 160 mg via INTRAVENOUS

## 2017-07-28 MED ORDER — HYDROMORPHONE HCL 1 MG/ML IJ SOLN
INTRAMUSCULAR | Status: AC
  Filled 2017-07-28: qty 0.5

## 2017-07-28 MED ORDER — KETOROLAC TROMETHAMINE 30 MG/ML IJ SOLN
INTRAMUSCULAR | Status: AC
Start: 2017-07-28 — End: ?
  Filled 2017-07-28: qty 1

## 2017-07-28 MED ORDER — FENTANYL CITRATE (PF) 100 MCG/2ML IJ SOLN
50.0000 ug | INTRAMUSCULAR | Status: AC | PRN
  Administered 2017-07-28 (×2): 25 ug via INTRAVENOUS
  Administered 2017-07-28: 50 ug via INTRAVENOUS
  Administered 2017-07-28: 25 ug via INTRAVENOUS
  Administered 2017-07-28 (×2): 50 ug via INTRAVENOUS
  Administered 2017-07-28: 25 ug via INTRAVENOUS

## 2017-07-28 MED ORDER — MIDAZOLAM HCL 2 MG/2ML IJ SOLN
INTRAMUSCULAR | Status: AC
  Filled 2017-07-28: qty 2

## 2017-07-28 MED ORDER — DEXAMETHASONE SODIUM PHOSPHATE 10 MG/ML IJ SOLN
INTRAMUSCULAR | Status: AC
  Filled 2017-07-28: qty 1

## 2017-07-28 MED ORDER — DEXAMETHASONE SODIUM PHOSPHATE 10 MG/ML IJ SOLN
INTRAMUSCULAR | Status: DC | PRN
  Administered 2017-07-28: 10 mg via INTRAVENOUS

## 2017-07-28 MED ORDER — OXYCODONE HCL 5 MG PO TABS
ORAL_TABLET | ORAL | Status: AC
  Filled 2017-07-28: qty 2

## 2017-07-28 MED ORDER — PROMETHAZINE HCL 25 MG PO TABS: 25.0000 mg | ORAL_TABLET | Freq: Four times a day (QID) | ORAL | 1 refills | Status: DC | PRN

## 2017-07-28 MED ORDER — HYDROMORPHONE HCL 1 MG/ML IJ SOLN
0.2500 mg | INTRAMUSCULAR | Status: DC | PRN
  Administered 2017-07-28 (×2): 0.5 mg via INTRAVENOUS

## 2017-07-28 MED ORDER — HYDROCODONE-ACETAMINOPHEN 7.5-325 MG PO TABS: 1.0000 | ORAL_TABLET | Freq: Once | ORAL | Status: DC | PRN

## 2017-07-28 MED ORDER — BUPIVACAINE HCL (PF) 0.25 % IJ SOLN
INTRAMUSCULAR | Status: DC | PRN
  Administered 2017-07-28: 10 mL

## 2017-07-28 MED ORDER — KETOROLAC TROMETHAMINE 30 MG/ML IJ SOLN
INTRAMUSCULAR | Status: DC | PRN
  Administered 2017-07-28: 30 mg via INTRAVENOUS

## 2017-07-28 MED ORDER — CEFAZOLIN SODIUM-DEXTROSE 2-4 GM/100ML-% IV SOLN
2.0000 g | INTRAVENOUS | Status: AC
  Administered 2017-07-28: 2 g via INTRAVENOUS

## 2017-07-28 MED ORDER — LIDOCAINE 2% (20 MG/ML) 5 ML SYRINGE
INTRAMUSCULAR | Status: DC | PRN
  Administered 2017-07-28: 100 mg via INTRAVENOUS

## 2017-07-28 MED ORDER — ACETAMINOPHEN 10 MG/ML IV SOLN
INTRAVENOUS | Status: AC
  Filled 2017-07-28: qty 100

## 2017-07-28 MED ORDER — OXYCODONE HCL 5 MG PO TABS: 10.0000 mg | ORAL_TABLET | Freq: Once | ORAL | Status: DC

## 2017-07-28 MED ORDER — MEPERIDINE HCL 25 MG/ML IJ SOLN: 6.2500 mg | INTRAMUSCULAR | Status: DC | PRN

## 2017-07-28 MED ORDER — OXYCODONE-ACETAMINOPHEN 5-325 MG PO TABS: 1.0000 | ORAL_TABLET | ORAL | 0 refills | Status: DC | PRN

## 2017-07-28 MED ORDER — LIDOCAINE HCL (CARDIAC) 20 MG/ML IV SOLN
INTRAVENOUS | Status: AC
  Filled 2017-07-28: qty 5

## 2017-07-28 MED ORDER — LACTATED RINGERS IV SOLN: INTRAVENOUS | Status: DC

## 2017-07-28 MED ORDER — PHENYLEPHRINE HCL 10 MG/ML IJ SOLN
INTRAMUSCULAR | Status: DC | PRN
  Administered 2017-07-28: 80 ug via INTRAVENOUS

## 2017-07-28 MED ORDER — CEFAZOLIN SODIUM-DEXTROSE 2-4 GM/100ML-% IV SOLN
INTRAVENOUS | Status: AC
Start: 2017-07-28 — End: ?
  Filled 2017-07-28: qty 100

## 2017-07-28 SURGICAL SUPPLY — 68 items
BANDAGE ACE 3X5.8 VEL STRL LF (GAUZE/BANDAGES/DRESSINGS) ×3 IMPLANT
BANDAGE ACE 4X5 VEL STRL LF (GAUZE/BANDAGES/DRESSINGS) ×3 IMPLANT
BIT DRILL 2.8 QR W/DEPTH MARKS (BIT) ×2 IMPLANT
BLADE AVERAGE 25MMX9MM (BLADE) ×1
BLADE AVERAGE 25X9 (BLADE) ×2 IMPLANT
BLADE SURG 15 STRL LF DISP TIS (BLADE) ×2 IMPLANT
BLADE SURG 15 STRL SS (BLADE) ×6
BNDG CMPR 9X4 STRL LF SNTH (GAUZE/BANDAGES/DRESSINGS) ×1
BNDG ESMARK 4X9 LF (GAUZE/BANDAGES/DRESSINGS) ×2 IMPLANT
CANISTER SUCT 1200ML W/VALVE (MISCELLANEOUS) IMPLANT
CORD BIPOLAR FORCEPS 12FT (ELECTRODE) IMPLANT
COVER BACK TABLE 60X90IN (DRAPES) ×3 IMPLANT
CUFF TOURNIQUET SINGLE 18IN (TOURNIQUET CUFF) ×3 IMPLANT
CUFF TOURNIQUET SINGLE 24IN (TOURNIQUET CUFF) ×1 IMPLANT
DRAPE EXTREMITY T 121X128X90 (DRAPE) ×3 IMPLANT
DRAPE IMP U-DRAPE 54X76 (DRAPES) ×6 IMPLANT
DRAPE INCISE IOBAN 66X45 STRL (DRAPES) ×2 IMPLANT
DRAPE OEC MINIVIEW 54X84 (DRAPES) ×2 IMPLANT
DRAPE SURG 17X23 STRL (DRAPES) ×5 IMPLANT
ELECT REM PT RETURN 9FT ADLT (ELECTROSURGICAL) ×3
ELECTRODE REM PT RTRN 9FT ADLT (ELECTROSURGICAL) IMPLANT
GAUZE SPONGE 4X4 12PLY STRL (GAUZE/BANDAGES/DRESSINGS) ×3 IMPLANT
GAUZE XEROFORM 1X8 LF (GAUZE/BANDAGES/DRESSINGS) ×3 IMPLANT
GLOVE BIOGEL PI IND STRL 7.0 (GLOVE) ×1 IMPLANT
GLOVE BIOGEL PI INDICATOR 7.0 (GLOVE) ×2
GLOVE ECLIPSE 7.0 STRL STRAW (GLOVE) ×3 IMPLANT
GLOVE SKINSENSE NS SZ7.5 (GLOVE) ×2
GLOVE SKINSENSE STRL SZ7.5 (GLOVE) ×1 IMPLANT
GLOVE SURG SYN 7.5  E (GLOVE) ×2
GLOVE SURG SYN 7.5 E (GLOVE) ×1 IMPLANT
GLOVE SURG SYN 7.5 PF PI (GLOVE) ×1 IMPLANT
GOWN STRL REIN XL XLG (GOWN DISPOSABLE) ×3 IMPLANT
GOWN STRL REUS W/ TWL LRG LVL3 (GOWN DISPOSABLE) ×1 IMPLANT
GOWN STRL REUS W/ TWL XL LVL3 (GOWN DISPOSABLE) ×2 IMPLANT
GOWN STRL REUS W/TWL LRG LVL3 (GOWN DISPOSABLE) ×3
GOWN STRL REUS W/TWL XL LVL3 (GOWN DISPOSABLE) ×6
GUIDEWIRE ORTHO MINI ACTK .045 (WIRE) ×2 IMPLANT
NEEDLE HYPO 22GX1.5 SAFETY (NEEDLE) ×3 IMPLANT
PACK BASIN DAY SURGERY FS (CUSTOM PROCEDURE TRAY) ×3 IMPLANT
PAD CAST 3X4 CTTN HI CHSV (CAST SUPPLIES) ×1 IMPLANT
PADDING CAST ABS 3INX4YD NS (CAST SUPPLIES) ×2
PADDING CAST ABS 4INX4YD NS (CAST SUPPLIES) ×2
PADDING CAST ABS COTTON 3X4 (CAST SUPPLIES) ×1 IMPLANT
PADDING CAST ABS COTTON 4X4 ST (CAST SUPPLIES) ×1 IMPLANT
PADDING CAST COTTON 3X4 STRL (CAST SUPPLIES) ×3
PENCIL BUTTON HOLSTER BLD 10FT (ELECTRODE) ×2 IMPLANT
PLATE ULNA MIDSHAFT 8 HOLE (Plate) ×2 IMPLANT
SCREW HEX 4.0X14MM (Screw) ×4 IMPLANT
SCREW HEXALOBE NON-LOCK 3.5X14 (Screw) ×8 IMPLANT
SCREW HEXALOBE NON-LOCK 3.5X16 (Screw) ×6 IMPLANT
SCREW NONLOCK HEX 3.5X12 (Screw) ×6 IMPLANT
SLEEVE SCD COMPRESS KNEE MED (MISCELLANEOUS) ×3 IMPLANT
SPLINT FIBERGLASS 4X30 (CAST SUPPLIES) ×3 IMPLANT
STOCKINETTE 4X48 STRL (DRAPES) ×3 IMPLANT
SUCTION FRAZIER HANDLE 10FR (MISCELLANEOUS) ×4
SUCTION TUBE FRAZIER 10FR DISP (MISCELLANEOUS) IMPLANT
SUT ETHILON 3 0 PS 1 (SUTURE) ×8 IMPLANT
SUT MERSILENE 4 0 P 3 (SUTURE) IMPLANT
SUT PDS AB 2-0 CT2 27 (SUTURE) IMPLANT
SUT VIC AB 0 SH 27 (SUTURE) ×2 IMPLANT
SUT VIC AB 2-0 PS2 27 (SUTURE) ×2 IMPLANT
SUT VICRYL 4-0 PS2 18IN ABS (SUTURE) IMPLANT
SYR BULB 3OZ (MISCELLANEOUS) ×3 IMPLANT
SYR CONTROL 10ML LL (SYRINGE) ×3 IMPLANT
TUBE CONNECTING 20'X1/4 (TUBING) ×2
TUBE CONNECTING 20X1/4 (TUBING) ×2 IMPLANT
UNDERPAD 30X30 (UNDERPADS AND DIAPERS) ×3 IMPLANT
WATER STERILE IRR 1000ML POUR (IV SOLUTION) ×3 IMPLANT

## 2017-07-28 NOTE — Anesthesia Postprocedure Evaluation (Signed)
Anesthesia Post Note  Patient: Katherine [name]  Procedure(s) Performed: REVISION RIGHT ULNAR SHORTENING OSTEOTOMY (Right Arm Lower)     Patient location during evaluation: PACU Anesthesia Type: General Level of consciousness: awake and alert Pain management: pain level controlled Vital Signs Assessment: post-procedure vital signs reviewed and stable Respiratory status: spontaneous breathing, nonlabored ventilation, respiratory function stable and patient connected to nasal cannula oxygen Cardiovascular status: blood pressure returned to baseline and stable Postop Assessment: no apparent nausea or vomiting Anesthetic complications: no    Last Vitals:  Vitals:   07/28/17 1045 07/28/17 1100  BP: (!) 169/108 (!) 159/92  Pulse: 99 (!) 107  Resp: 12 (!) 24  Temp:    SpO2: 100% 99%    Last Pain:  Vitals:   07/28/17 1024  TempSrc:   PainSc: [place]

## 2017-07-28 NOTE — Op Note (Signed)
   Date of Surgery: 07/28/2017  INDICATIONS: Katherine [name] is a 67 y.o.-year-old female with hardware failure of previous ulnar shortening osteotomy;  The patient did consent to the procedure after discussion of the risks and benefits.  PREOPERATIVE DIAGNOSIS: Failure of hardware status post previous ulnar shortening osteotomy  POSTOPERATIVE DIAGNOSIS: Same.  PROCEDURE:  1.  Revision of right ulnar shortening osteotomy with compression plating 2.  Secondary closure of previous surgical wound 3.  Sharp excisional debridement of skin, subcutaneous tissue, bone 40 cm  SURGEON: N. Eduard Roux, M.D.  ASSIST: Ciro Backer [place], [place]; necessary for the timely completion of procedure and due to complexity of procedure.  ANESTHESIA:  general  IV FLUIDS AND URINE: See anesthesia.  ESTIMATED BLOOD LOSS: Minimal mL.  IMPLANTS: Acumed 8 hole dynamic compression plate  DRAINS: None  COMPLICATIONS: None.  DESCRIPTION OF PROCEDURE: The patient was brought to the operating room and placed supine on the operating table.  The patient had been signed prior to the procedure and this was documented. The patient had the anesthesia placed by the anesthesiologist.  A time-out was performed to confirm that this was the correct patient, site, side and location. The patient did receive antibiotics prior to the incision and was re-dosed during the procedure as needed at indicated intervals.  A tourniquet was placed.  The patient had the operative extremity prepped and draped in the standard surgical fashion.    The previous surgical incision was sharply incised.  Dissection was carried down through the tissue planes onto the ulna.  Sharp excisional debridement of the skin, subcutaneous tissue, bone was performed using a rondure and 15 blade back to a healthy border.  After thorough debridement we then removed the hardware in its entirety.  The proximal portion of the plate had good fixation but the distal  portion of the plate had come undone from the distal ulnar shaft.  After removal of all the hardware I evaluated the osteotomy site for any evidence of infection.  I could not find any obvious evidence of why the hardware failed.  I then used a 8 hole dynamic compression plate and placed it on the volar aspect of the ulna at the appropriate position using fluoroscopic guidance.  With the plate held in place I then placed 4 screws distal and 4 screws proximal to the osteotomy site using standard AO technique.  Each screw had excellent purchase.  I was able to compress across the fracture using the dynamic slot of the plate.  Final x-rays were taken.  The wound was then thoroughly irrigated and secondary closure of the previous surgical incision was performed using 0 Vicryl for the fascia, 2-0 Vicryl for the subcutaneous layer and 3-0 nylon for the skin.  Sterile dressings were applied.  The arm was placed in a fiberglass cast.  Patient tolerated procedure well had no immediate complications.  POSTOPERATIVE PLAN: Discharge home with follow-up in 1 week.  [name], MD [place] 9:51 AM

## 2017-07-28 NOTE — H&P (Signed)
PREOPERATIVE H&P  Chief Complaint: failed hardware right ulnar shortening osteotomy  HPI: Katherine [name] is a 67 y.o. female who presents for surgical treatment of failed hardware right ulnar shortening osteotomy.  She denies any changes in medical history.  Past Medical History:  Diagnosis Date  . Dental crowns present   . History of kidney stones   . Hypertension    states under control with meds., has been on med. x 20 yr.  . Osteoarthritis   . Peripheral neuropathy    hands and feet  . Triangular fibrocartilage complex tear 06/2017   right  . 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 Wears partial dentures    upper and lower   Past Surgical History:  Procedure Laterality Date  . ABDOMINAL HYSTERECTOMY  1991   partial  . CARPAL TUNNEL RELEASE Right 08/09/2002  . CARPAL TUNNEL RELEASE Left 11/03/2006  . COLONOSCOPY WITH PROPOFOL  11/24/2011  . CYSTOSCOPY WITH RETROGRADE PYELOGRAM, URETEROSCOPY AND STENT PLACEMENT Left 12/18/2014   Procedure: CYSTOSCOPY WITH RETROGRADE PYELOGRAM, URETEROSCOPY AND STENT PLACEMENT left ureter;  Surgeon: Raynelle Bring, MD;  [facility];  Service: Urology;  Laterality: Left;  . DILATION AND CURETTAGE OF UTERUS    . ELBOW ARTHROSCOPY Right   . EXTRACORPOREAL SHOCK WAVE LITHOTRIPSY Left 03/08/2017   Procedure: LEFT EXTRACORPOREAL SHOCK WAVE LITHOTRIPSY (ESWL);  Surgeon: Raynelle Bring, MD;  Location: [facility];  Service: Urology;  Laterality: Left;  . HOLMIUM LASER APPLICATION Left 07/11/9922   Procedure: HOLMIUM LASER APPLICATION left ureter ;  Surgeon: Raynelle Bring, MD;  [facility];  Service: Urology;  Laterality: Left;  . PARATHYROIDECTOMY Right 05/03/2015   Procedure: RIGHT SUPERIOR PARATHYROIDECTOMY;  Surgeon: Armandina Gemma, MD;  Location: [facility];  Service: General;  Laterality: Right;  . PATELLA REALIGNMENT Bilateral   . REPLACEMENT TOTAL KNEE BILATERAL    . ROTATOR CUFF REPAIR Right    x2  . SHOULDER ARTHROSCOPY Right    x 2    . SHOULDER ARTHROSCOPY W/ ROTATOR CUFF REPAIR Left 04/26/2002  . TONSILLECTOMY    . TOTAL KNEE ARTHROPLASTY Left 09/10/2003  . TOTAL KNEE ARTHROPLASTY Right 11/10/2004  . TRIGGER FINGER RELEASE Right 08/06/2008   ring finger  . TRIGGER FINGER RELEASE     multiple - right thumb, left index/long/ring/thumb  . WRIST ARTHROSCOPY Left 06/16/2000  . WRIST ARTHROSCOPY WITH ULNA SHORTENING Right 07/07/2017   Procedure: RIGHT WRIST ARTHROSCOPY WITH ULNAR SHORTENING OSTEOTOMY;  Surgeon: Leandrew Koyanagi, MD;  Location: [facility];  Service: Orthopedics;  Laterality: Right;   Social History   Socioeconomic History  . Marital status: Single    Spouse name: None  . Number of children: 1  . Years of education: None  . Highest education level: None  Social Needs  . Financial resource strain: None  . Food insecurity - worry: None  . Food insecurity - inability: None  . Transportation needs - medical: None  . Transportation needs - non-medical: None  Occupational History    Employer: [employer]  Tobacco Use  . Smoking status: Never Smoker  . Smokeless tobacco: Never Used  Substance and Sexual Activity  . Alcohol use: No  . Drug use: No  . Sexual activity: No  Other Topics Concern  . None  Social History Narrative   Patient is single, has 1 child   Patient is right handed   Caffeine consumption is 0   Family History  Problem Relation Age of Onset  .  Stroke Mother   . Cancer Mother 43       bone cancer  . Diabetes Sister    Allergies  Allergen Reactions  . Erythromycin Other (See Comments)    CHEST PAIN   Prior to Admission medications   Medication Sig Start Date End Date Taking? Authorizing Provider  b complex vitamins tablet Take 1 tablet by mouth daily.   Yes [provider]  calcium-vitamin D (OSCAL WITH D) 500-200 MG-UNIT tablet Take 1 tablet by mouth 3 (three) times daily. 07/07/17  Yes Leandrew Koyanagi, MD  cholecalciferol (VITAMIN D) 1000 units tablet  Take 2,000 Units by mouth daily.   Yes [provider]  hydrochlorothiazide (HYDRODIURIL) 12.5 MG tablet Take 12.5 mg by mouth daily.   Yes [provider]  ibuprofen (IBU) 800 MG tablet Take 800 mg by mouth every 8 (eight) hours as needed.   Yes [provider]  L-Methylfolate-Algae-B12-B6 Glade Stanford) 3-90.314-2-35 MG CAPS Take by mouth 2 (two) times daily.   Yes [provider]  lidocaine-prilocaine (EMLA) cream Apply four times per day as needed. 03/25/17  Yes Marletta Lor, MD  lisinopril (PRINIVIL,ZESTRIL) 10 MG tablet TAKE 1 TABLET BY MOUTH  DAILY 06/22/17  Yes Marletta Lor, MD  Multiple Vitamin (MULTIVITAMIN) tablet Take 1 tablet by mouth daily.   Yes [provider]  nortriptyline (PAMELOR) 25 MG capsule Take 3 capsules (75 mg total) by mouth at bedtime. 11/23/16  Yes Marletta Lor, MD  pregabalin (LYRICA) 100 MG capsule Take 1 capsule (100 mg total) 2 (two) times daily by mouth. Please call [phone] to schedule an appt. 03/25/17  Yes Marletta Lor, MD  traMADol Veatrice Bourbon) 50 MG tablet Take one to two tablets by mouth every 6 hours if needed for pain, Prescribed by Neurology 12/31/15  Yes Marcial Pacas, MD  zinc sulfate 220 (50 Zn) MG capsule Take 1 capsule (220 mg total) by mouth daily. 07/07/17  Yes Leandrew Koyanagi, MD  gabapentin (NEURONTIN) 300 MG capsule Take 300 mg by mouth 3 (three) times daily.    07/30/11  [provider]     Positive ROS: All other systems have been reviewed and were otherwise negative with the exception of those mentioned in the HPI and as above.  Physical Exam: General: Alert, no acute distress Cardiovascular: No pedal edema Respiratory: No cyanosis, no use of accessory musculature GI: abdomen soft Skin: No lesions in the area of chief complaint Neurologic: Sensation intact distally Psychiatric: Patient is competent for consent with normal mood and affect Lymphatic: no  lymphedema  MUSCULOSKELETAL: exam stable  Assessment: failed hardware right ulnar shortening osteotomy  Plan: Plan for Procedure(s): REVISION RIGHT ULNAR SHORTENING OSTEOTOMY  The risks benefits and alternatives were discussed with the patient including but not limited to the risks of nonoperative treatment, versus surgical intervention including infection, bleeding, nerve injury,  blood clots, cardiopulmonary complications, morbidity, mortality, among others, and they were willing to proceed.   Eduard Roux, MD   07/28/2017 8:02 AM

## 2017-07-28 NOTE — Transfer of Care (Signed)
Immediate Anesthesia Transfer of Care Note  Patient: Katherine [name]  Procedure(s) Performed: REVISION RIGHT ULNAR SHORTENING OSTEOTOMY (Right Arm Lower)  Patient Location: PACU  Anesthesia Type:General  Level of Consciousness: sedated  Airway & Oxygen Therapy: Patient Spontanous Breathing and Patient connected to face mask oxygen  Post-op Assessment: Report given to RN and Post -op Vital signs reviewed and stable  Post vital signs: Reviewed and stable  Last Vitals:  Vitals:   07/28/17 0716 07/28/17 0720  BP:  103/82  Pulse: 100   Resp: 18   Temp: 36.8 C   SpO2: 97%     Last Pain:  Vitals:   07/28/17 0716  TempSrc: Oral  PainSc: 7       Patients Stated Pain Goal: 4 (58/59/29 2446)  Complications: No apparent anesthesia complications

## 2017-07-28 NOTE — Anesthesia Preprocedure Evaluation (Addendum)
Anesthesia Evaluation  Patient identified by MRN, date of birth, ID band Patient awake    Reviewed: Allergy & Precautions, NPO status , Patient's Chart, lab work & pertinent test results  Airway Mallampati: II  TM Distance: >3 FB Neck ROM: Full    Dental  (+) Missing   Pulmonary neg pulmonary ROS,    Pulmonary exam normal breath sounds clear to auscultation       Cardiovascular hypertension, Pt. on medications Normal cardiovascular exam Rhythm:Regular Rate:Normal  ECG: NSR, rate 99   Neuro/Psych Peripheral neuropathy  Neuromuscular disease negative psych ROS   GI/Hepatic negative GI ROS, Neg liver ROS,   Endo/Other  Morbid obesity  Renal/GU negative Renal ROS     Musculoskeletal negative musculoskeletal ROS (+)   Abdominal (+) + obese,   Peds  Hematology negative hematology ROS (+)   Anesthesia Other Findings right triangular fibrocartilage complex tear, ulnar impaction syndrome  Reproductive/Obstetrics                             Anesthesia Physical Anesthesia Plan  ASA: III  Anesthesia Plan: General   Post-op Pain Management:    Induction: Intravenous  PONV Risk Score and Plan: Treatment may vary due to age or medical condition  Airway Management Planned: LMA  Additional Equipment:   Intra-op Plan:   Post-operative Plan:   Informed Consent: I have reviewed the patients History and Physical, chart, labs and discussed the procedure including the risks, benefits and alternatives for the proposed anesthesia with the patient or authorized representative who has indicated his/her understanding and acceptance.     Plan Discussed with: CRNA  Anesthesia Plan Comments:         Anesthesia Quick Evaluation

## 2017-07-28 NOTE — Anesthesia Procedure Notes (Signed)
Procedure Name: LMA Insertion Date/Time: 07/28/2017 8:36 AM Performed by: Maryella Shivers, CRNA Pre-anesthesia Checklist: Patient identified, Emergency Drugs available, Suction available and Patient being monitored Patient Re-evaluated:Patient Re-evaluated prior to induction Oxygen Delivery Method: Circle system utilized Preoxygenation: Pre-oxygenation with 100% oxygen Induction Type: IV induction Ventilation: Mask ventilation without difficulty LMA: LMA inserted LMA Size: 4.0 Number of attempts: 1 Airway Equipment and Method: Bite block Placement Confirmation: positive ETCO2 Tube secured with: Tape Dental Injury: Teeth and Oropharynx as per pre-operative assessment

## 2017-07-28 NOTE — Discharge Instructions (Signed)
Postoperative instructions:  Weightbearing instructions: non weight bearing  Dressing instructions: Keep your dressing and/or splint clean and dry at all times.  It will be removed at your first post-operative appointment.  Your stitches and/or staples will be removed at this visit.  Incision instructions:  Do not soak your incision for 3 weeks after surgery.  If the incision gets wet, pat dry and do not scrub the incision.  Pain control:  You have been given a prescription to be taken as directed for post-operative pain control.  In addition, elevate the operative extremity above the heart at all times to prevent swelling and throbbing pain.  Take over-the-counter Colace, 100mg  by mouth twice a day while taking narcotic pain medications to help prevent constipation.  Follow up appointments: 1) 10-14 days for suture removal and wound check. 2) Dr. Erlinda Hong as scheduled.   -------------------------------------------------------------------------------------------------------------  After Surgery Pain Control:  After your surgery, post-surgical discomfort or pain is likely. This discomfort can last several days to a few weeks. At certain times of the day your discomfort may be more intense.  Did you receive a nerve block?  A nerve block can provide pain relief for one hour to two days after your surgery. As long as the nerve block is working, you will experience little or no sensation in the area the surgeon operated on.  As the nerve block wears off, you will begin to experience pain or discomfort. It is very important that you begin taking your prescribed pain medication before the nerve block fully wears off. Treating your pain at the first sign of the block wearing off will ensure your pain is better controlled and more tolerable when full-sensation returns. Do not wait until the pain is intolerable, as the medicine will be less effective. It is better to treat pain in advance than to try and  catch up.  General Anesthesia:  If you did not receive a nerve block during your surgery, you will need to start taking your pain medication shortly after your surgery and should continue to do so as prescribed by your surgeon.  Pain Medication:  Most commonly we prescribe Vicodin and Percocet for post-operative pain. Both of these medications contain a combination of acetaminophen (Tylenol) and a narcotic to help control pain.   It takes between 30 and 45 minutes before pain medication starts to work. It is important to take your medication before your pain level gets too intense.   Nausea is a common side effect of many pain medications. You will want to eat something before taking your pain medicine to help prevent nausea.   If you are taking a prescription pain medication that contains acetaminophen, we recommend that you do not take additional over the counter acetaminophen (Tylenol).  Other pain relieving options:   Using a cold pack to ice the affected area a few times a day (15 to 20 minutes at a time) can help to relieve pain, reduce swelling and bruising.   Elevation of the affected area can also help to reduce pain and swelling.         Post Anesthesia Home Care Instructions  Activity: Get plenty of rest for the remainder of the day. A responsible individual must stay with you for 24 hours following the procedure.  For the next 24 hours, DO NOT: -Drive a car -Paediatric nurse -Drink alcoholic beverages -Take any medication unless instructed by your physician -Make any legal decisions or sign important papers.  Meals: Start with liquid  foods such as gelatin or soup. Progress to regular foods as tolerated. Avoid greasy, spicy, heavy foods. If nausea and/or vomiting occur, drink only clear liquids until the nausea and/or vomiting subsides. Call your physician if vomiting continues.  Special Instructions/Symptoms: Your throat may feel dry or sore from the anesthesia  or the breathing tube placed in your throat during surgery. If this causes discomfort, gargle with warm salt water. The discomfort should disappear within 24 hours.  If you had a scopolamine patch placed behind your ear for the management of post- operative nausea and/or vomiting:  1. The medication in the patch is effective for 72 hours, after which it should be removed.  Wrap patch in a tissue and discard in the trash. Wash hands thoroughly with soap and water. 2. You may remove the patch earlier than 72 hours if you experience unpleasant side effects which may include dry mouth, dizziness or visual disturbances. 3. Avoid touching the patch. Wash your hands with soap and water after contact with the patch.

## 2017-07-29 ENCOUNTER — Encounter (HOSPITAL_BASED_OUTPATIENT_CLINIC_OR_DEPARTMENT_OTHER): Payer: Self-pay | Admitting: Orthopaedic Surgery

## 2017-08-05 ENCOUNTER — Ambulatory Visit (INDEPENDENT_AMBULATORY_CARE_PROVIDER_SITE_OTHER): Payer: Medicare Other | Admitting: Orthopaedic Surgery

## 2017-08-05 ENCOUNTER — Encounter (INDEPENDENT_AMBULATORY_CARE_PROVIDER_SITE_OTHER): Payer: Self-pay | Admitting: Orthopaedic Surgery

## 2017-08-05 ENCOUNTER — Ambulatory Visit (INDEPENDENT_AMBULATORY_CARE_PROVIDER_SITE_OTHER): Payer: Medicare Other

## 2017-08-05 DIAGNOSIS — M25831 Other specified joint disorders, right wrist: Secondary | ICD-10-CM

## 2017-08-05 NOTE — Progress Notes (Signed)
Post-Op Visit Note   Patient: Katherine [name]           Date of Birth: 1951/04/16           MRN: [id number] Visit Date: 08/05/2017 PCP: Marletta Lor, MD   Assessment & Plan:  Chief Complaint:  Chief Complaint  Patient presents with  . Right Wrist - Routine Post Op   Visit Diagnoses:  1. Ulnar impaction syndrome, right     Plan: Katherine [name] comes in for follow-up.  8 days status post revision of right ulnar shortening osteotomy with compression plating, date of surgery 07/28/2017.  She has been in a short arm cast nonweightbearing.  Elevating for swelling.  Minimal pain.  No fevers chills or any other systemic symptoms.  Examination of her right upper extremity reveals minimal swelling to the fingers.  Good sensation as well.  The cast does appear to be loose.  Today, we will apply a new short arm cast.  She will continue to elevate for swelling.  Continue with motion of the fingers.  She will follow-up with [place] in 1 week's time for suture removal and probable application of a new short arm cast.  She will call with concerns or questions in the meantime.  Follow-Up Instructions: Return in about 1 week (around 08/12/2017) for suture removal.   Orders:  Orders Placed This Encounter  Procedures  . XR Wrist 2 Views Right   No orders of the defined types were placed in this encounter.   Imaging: Xr Wrist 2 Views Right  Result Date: 08/05/2017 X-rays reveal stable alignment of the fracture site and hardware.   PMFS History: Patient Active Problem List   Diagnosis Date Noted  . Closed displaced oblique fracture of shaft of ulna with malunion, subsequent encounter 07/28/2017  . 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 Pain in right wrist 06/01/2017  . Trigger finger, right index finger 09/28/2016  . Impaired glucose tolerance 09/09/2015  . Hyperparathyroidism ([place]) 05/26/2013  . Hereditary and idiopathic peripheral neuropathy  12/29/2012  . Obese   . Menopausal symptoms 11/10/2011  . DIVERTICULOSIS, COLON 11/02/2007  . Dyslipidemia 03/09/2006  . Essential hypertension 03/09/2006  . NEPHROLITHIASIS, HX OF 03/09/2006   Past Medical History:  Diagnosis Date  . Dental crowns present   . History of kidney stones   . Hypertension    states under control with meds., has been on med. x 20 yr.  . Osteoarthritis   . Peripheral neuropathy    hands and feet  . Triangular fibrocartilage complex tear 06/2017   right  . 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 Wears partial dentures    upper and lower    Family History  Problem Relation Age of Onset  . Stroke Mother   . Cancer Mother 98       bone cancer  . Diabetes Sister     Past Surgical History:  Procedure Laterality Date  . ABDOMINAL HYSTERECTOMY  1991   partial  . CARPAL TUNNEL RELEASE Right 08/09/2002  . CARPAL TUNNEL RELEASE Left 11/03/2006  . COLONOSCOPY WITH PROPOFOL  11/24/2011  . CYSTOSCOPY WITH RETROGRADE PYELOGRAM, URETEROSCOPY AND STENT PLACEMENT Left 12/18/2014   Procedure: CYSTOSCOPY WITH RETROGRADE PYELOGRAM, URETEROSCOPY AND STENT PLACEMENT left ureter;  Surgeon: Raynelle Bring, MD;  [facility];  Service: Urology;  Laterality: Left;  . DILATION AND CURETTAGE OF UTERUS    . ELBOW ARTHROSCOPY Right   . EXTRACORPOREAL SHOCK WAVE  LITHOTRIPSY Left 03/08/2017   Procedure: LEFT EXTRACORPOREAL SHOCK WAVE LITHOTRIPSY (ESWL);  Surgeon: Raynelle Bring, MD;  [facility];  Service: Urology;  Laterality: Left;  . HOLMIUM LASER APPLICATION Left 06/14/5571   Procedure: HOLMIUM LASER APPLICATION left ureter ;  Surgeon: Raynelle Bring, MD;  [facility];  Service: Urology;  Laterality: Left;  . PARATHYROIDECTOMY Right 05/03/2015   Procedure: RIGHT SUPERIOR PARATHYROIDECTOMY;  Surgeon: Armandina Gemma, MD;  Location: [place];  Service: General;  Laterality: Right;  . PATELLA REALIGNMENT Bilateral   . REPLACEMENT TOTAL KNEE BILATERAL    . ROTATOR CUFF  REPAIR Right    x2  . SHOULDER ARTHROSCOPY Right    x 2  . SHOULDER ARTHROSCOPY W/ ROTATOR CUFF REPAIR Left 04/26/2002  . TONSILLECTOMY    . TOTAL KNEE ARTHROPLASTY Left 09/10/2003  . TOTAL KNEE ARTHROPLASTY Right 11/10/2004  . TRIGGER FINGER RELEASE Right 08/06/2008   ring finger  . TRIGGER FINGER RELEASE     multiple - right thumb, left index/long/ring/thumb  . ULNA OSTEOTOMY Right 07/28/2017   Procedure: REVISION RIGHT ULNAR SHORTENING OSTEOTOMY;  Surgeon: Leandrew Koyanagi, MD;  Location: [place];  Service: Orthopedics;  Laterality: Right;  . WRIST ARTHROSCOPY Left 06/16/2000  . WRIST ARTHROSCOPY WITH ULNA SHORTENING Right 07/07/2017   Procedure: RIGHT WRIST ARTHROSCOPY WITH ULNAR SHORTENING OSTEOTOMY;  Surgeon: Leandrew Koyanagi, MD;  Location: [name];  Service: Orthopedics;  Laterality: Right;   Social History   Occupational History    Employer: [employer]  Tobacco Use  . Smoking status: Never Smoker  . Smokeless tobacco: Never Used  Substance and Sexual Activity  . Alcohol use: No  . Drug use: No  . Sexual activity: Never

## 2017-08-12 ENCOUNTER — Ambulatory Visit (INDEPENDENT_AMBULATORY_CARE_PROVIDER_SITE_OTHER): Payer: Medicare Other

## 2017-08-12 ENCOUNTER — Ambulatory Visit (INDEPENDENT_AMBULATORY_CARE_PROVIDER_SITE_OTHER): Payer: Medicare Other | Admitting: Orthopaedic Surgery

## 2017-08-12 ENCOUNTER — Encounter (INDEPENDENT_AMBULATORY_CARE_PROVIDER_SITE_OTHER): Payer: Self-pay | Admitting: Orthopaedic Surgery

## 2017-08-12 DIAGNOSIS — M25831 Other specified joint disorders, right wrist: Secondary | ICD-10-CM | POA: Diagnosis not present

## 2017-08-12 NOTE — Progress Notes (Signed)
Post-Op Visit Note   Patient: Katherine [name]           Date of Birth: 1950-10-07           MRN: [id number] Visit Date: 08/12/2017 PCP: Marletta Lor, MD   Assessment & Plan:  Chief Complaint:  Chief Complaint  Patient presents with  . Right Wrist - Routine Post Op   Visit Diagnoses:  1. Ulnar impaction syndrome, right     Plan: Bristyn comes in for follow-up.  15 days status post revision ulnar shortening osteotomy date of surgery 07/28/2017.  She has been doing well.  She is been in her short arm cast elevating for swelling.  Minimal pain.  No fevers chills or any other systemic symptoms.  Examination of her right arm reveals well-healing surgical incision with nylon sutures in place.  Full range of motion of the fingers.  Minimal swelling to the hand.  She is neurovascularly intact distally.  Today, we will remove the nylon sutures.  We will place her back in a short arm cast nonweightbearing for the next 2 weeks.  She will follow-up with [place] at that point for repeat x-rays.  At that point, we will likely transition her back into an ulnar gutter removable splint to be worn at all times.  She will call with concerns or questions in the meantime.  Follow-Up Instructions: Return in about 2 weeks (around 08/26/2017).   Orders:  Orders Placed This Encounter  Procedures  . XR Wrist 2 Views Right   No orders of the defined types were placed in this encounter.   Imaging: Xr Wrist 2 Views Right  Result Date: 08/12/2017 X-rays show stable alignment of the fracture and hardware   PMFS History: Patient Active Problem List   Diagnosis Date Noted  . Closed displaced oblique fracture of shaft of ulna with malunion, subsequent encounter 07/28/2017  . 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 Pain in right wrist 06/01/2017  . Trigger finger, right index finger 09/28/2016  . Impaired glucose tolerance 09/09/2015  . Hyperparathyroidism  ([place]) 05/26/2013  . Hereditary and idiopathic peripheral neuropathy 12/29/2012  . Obese   . Menopausal symptoms 11/10/2011  . DIVERTICULOSIS, COLON 11/02/2007  . Dyslipidemia 03/09/2006  . Essential hypertension 03/09/2006  . NEPHROLITHIASIS, HX OF 03/09/2006   Past Medical History:  Diagnosis Date  . Dental crowns present   . History of kidney stones   . Hypertension    states under control with meds., has been on med. x 20 yr.  . Osteoarthritis   . Peripheral neuropathy    hands and feet  . Triangular fibrocartilage complex tear 06/2017   right  . 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 Wears partial dentures    upper and lower    Family History  Problem Relation Age of Onset  . Stroke Mother   . Cancer Mother 70       bone cancer  . Diabetes Sister     Past Surgical History:  Procedure Laterality Date  . ABDOMINAL HYSTERECTOMY  1991   partial  . CARPAL TUNNEL RELEASE Right 08/09/2002  . CARPAL TUNNEL RELEASE Left 11/03/2006  . COLONOSCOPY WITH PROPOFOL  11/24/2011  . CYSTOSCOPY WITH RETROGRADE PYELOGRAM, URETEROSCOPY AND STENT PLACEMENT Left 12/18/2014   Procedure: CYSTOSCOPY WITH RETROGRADE PYELOGRAM, URETEROSCOPY AND STENT PLACEMENT left ureter;  Surgeon: Raynelle Bring, MD;  [facility];  Service: Urology;  Laterality: Left;  . DILATION AND  CURETTAGE OF UTERUS    . ELBOW ARTHROSCOPY Right   . EXTRACORPOREAL SHOCK WAVE LITHOTRIPSY Left 03/08/2017   Procedure: LEFT EXTRACORPOREAL SHOCK WAVE LITHOTRIPSY (ESWL);  Surgeon: Raynelle Bring, MD;  [facility];  Service: Urology;  Laterality: Left;  . HOLMIUM LASER APPLICATION Left 05/16/1094   Procedure: HOLMIUM LASER APPLICATION left ureter ;  Surgeon: Raynelle Bring, MD;  [facility];  Service: Urology;  Laterality: Left;  . PARATHYROIDECTOMY Right 05/03/2015   Procedure: RIGHT SUPERIOR PARATHYROIDECTOMY;  Surgeon: Armandina Gemma, MD;  Location: [place];  Service: General;  Laterality: Right;  . PATELLA  REALIGNMENT Bilateral   . REPLACEMENT TOTAL KNEE BILATERAL    . ROTATOR CUFF REPAIR Right    x2  . SHOULDER ARTHROSCOPY Right    x 2  . SHOULDER ARTHROSCOPY W/ ROTATOR CUFF REPAIR Left 04/26/2002  . TONSILLECTOMY    . TOTAL KNEE ARTHROPLASTY Left 09/10/2003  . TOTAL KNEE ARTHROPLASTY Right 11/10/2004  . TRIGGER FINGER RELEASE Right 08/06/2008   ring finger  . TRIGGER FINGER RELEASE     multiple - right thumb, left index/long/ring/thumb  . ULNA OSTEOTOMY Right 07/28/2017   Procedure: REVISION RIGHT ULNAR SHORTENING OSTEOTOMY;  Surgeon: Leandrew Koyanagi, MD;  Location: [place];  Service: Orthopedics;  Laterality: Right;  . WRIST ARTHROSCOPY Left 06/16/2000  . WRIST ARTHROSCOPY WITH ULNA SHORTENING Right 07/07/2017   Procedure: RIGHT WRIST ARTHROSCOPY WITH ULNAR SHORTENING OSTEOTOMY;  Surgeon: Leandrew Koyanagi, MD;  Location: [place];  Service: Orthopedics;  Laterality: Right;   Social History   Occupational History    Employer: [employer]  Tobacco Use  . Smoking status: Never Smoker  . Smokeless tobacco: Never Used  Substance and Sexual Activity  . Alcohol use: No  . Drug use: No  . Sexual activity: Never

## 2017-08-26 ENCOUNTER — Telehealth (INDEPENDENT_AMBULATORY_CARE_PROVIDER_SITE_OTHER): Payer: Self-pay | Admitting: Orthopaedic Surgery

## 2017-08-26 ENCOUNTER — Ambulatory Visit (INDEPENDENT_AMBULATORY_CARE_PROVIDER_SITE_OTHER): Payer: Medicare Other | Admitting: Orthopaedic Surgery

## 2017-08-26 ENCOUNTER — Ambulatory Visit (INDEPENDENT_AMBULATORY_CARE_PROVIDER_SITE_OTHER): Payer: Medicare Other

## 2017-08-26 DIAGNOSIS — M25831 Other specified joint disorders, right wrist: Secondary | ICD-10-CM

## 2017-08-26 NOTE — Progress Notes (Signed)
Post-Op Visit Note   Patient: Katherine [name]           Date of Birth: 22-Dec-1950           MRN: [id number] Visit Date: 08/26/2017 PCP: Marletta Lor, MD   Assessment & Plan:  Chief Complaint:  Chief Complaint  Patient presents with  . Right Wrist - Pain, Follow-up   Visit Diagnoses:  1. Ulnar impaction syndrome, right     Plan: Patient is 4 weeks status post revision shortening osteotomy secondary to hardware failure and pullout.  She is doing well today.  She is not complaining of pain.  Her surgical scar is fully healed.  She has painless range of motion of her wrist.  X-rays demonstrate stable fixation and alignment of the fracture.  At this point we will transition her to a removable wrist brace.  Begin therapy for joint mobilization and strengthening.  Follow-up in 4 weeks with repeat 2 view x-rays of the right forearm.  Follow-Up Instructions: Return in about 1 month (around 09/23/2017).   Orders:  Orders Placed This Encounter  Procedures  . XR Wrist 2 Views Right   No orders of the defined types were placed in this encounter.   Imaging: Xr Wrist 2 Views Right  Result Date: 08/26/2017 Stable fixation of ulna osteotomy.  No complications   PMFS History: Patient Active Problem List   Diagnosis Date Noted  . Closed displaced oblique fracture of shaft of ulna with malunion, subsequent encounter 07/28/2017  . 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 Pain in right wrist 06/01/2017  . Trigger finger, right index finger 09/28/2016  . Impaired glucose tolerance 09/09/2015  . Hyperparathyroidism ([place]) 05/26/2013  . Hereditary and idiopathic peripheral neuropathy 12/29/2012  . Obese   . Menopausal symptoms 11/10/2011  . DIVERTICULOSIS, COLON 11/02/2007  . Dyslipidemia 03/09/2006  . Essential hypertension 03/09/2006  . NEPHROLITHIASIS, HX OF 03/09/2006   Past Medical History:  Diagnosis Date  . Dental crowns  present   . History of kidney stones   . Hypertension    states under control with meds., has been on med. x 20 yr.  . Osteoarthritis   . Peripheral neuropathy    hands and feet  . Triangular fibrocartilage complex tear 06/2017   right  . 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 Wears partial dentures    upper and lower    Family History  Problem Relation Age of Onset  . Stroke Mother   . Cancer Mother 63       bone cancer  . Diabetes Sister     Past Surgical History:  Procedure Laterality Date  . ABDOMINAL HYSTERECTOMY  1991   partial  . CARPAL TUNNEL RELEASE Right 08/09/2002  . CARPAL TUNNEL RELEASE Left 11/03/2006  . COLONOSCOPY WITH PROPOFOL  11/24/2011  . CYSTOSCOPY WITH RETROGRADE PYELOGRAM, URETEROSCOPY AND STENT PLACEMENT Left 12/18/2014   Procedure: CYSTOSCOPY WITH RETROGRADE PYELOGRAM, URETEROSCOPY AND STENT PLACEMENT left ureter;  Surgeon: Raynelle Bring, MD;  [facility];  Service: Urology;  Laterality: Left;  . DILATION AND CURETTAGE OF UTERUS    . ELBOW ARTHROSCOPY Right   . EXTRACORPOREAL SHOCK WAVE LITHOTRIPSY Left 03/08/2017   Procedure: LEFT EXTRACORPOREAL SHOCK WAVE LITHOTRIPSY (ESWL);  Surgeon: Raynelle Bring, MD;  [facility];  Service: Urology;  Laterality: Left;  . HOLMIUM LASER APPLICATION Left 07/11/9922   Procedure: HOLMIUM LASER APPLICATION left ureter ;  Surgeon: Raynelle Bring, MD;  [facility];  Service: Urology;  Laterality: Left;  . PARATHYROIDECTOMY Right 05/03/2015   Procedure: RIGHT SUPERIOR PARATHYROIDECTOMY;  Surgeon: Armandina Gemma, MD;  Location: [place];  Service: General;  Laterality: Right;  . PATELLA REALIGNMENT Bilateral   . REPLACEMENT TOTAL KNEE BILATERAL    . ROTATOR CUFF REPAIR Right    x2  . SHOULDER ARTHROSCOPY Right    x 2  . SHOULDER ARTHROSCOPY W/ ROTATOR CUFF REPAIR Left 04/26/2002  . TONSILLECTOMY    . TOTAL KNEE ARTHROPLASTY Left 09/10/2003  . TOTAL KNEE ARTHROPLASTY Right 11/10/2004  . TRIGGER FINGER RELEASE  Right 08/06/2008   ring finger  . TRIGGER FINGER RELEASE     multiple - right thumb, left index/long/ring/thumb  . ULNA OSTEOTOMY Right 07/28/2017   Procedure: REVISION RIGHT ULNAR SHORTENING OSTEOTOMY;  Surgeon: Leandrew Koyanagi, MD;  Location: [place];  Service: Orthopedics;  Laterality: Right;  . WRIST ARTHROSCOPY Left 06/16/2000  . WRIST ARTHROSCOPY WITH ULNA SHORTENING Right 07/07/2017   Procedure: RIGHT WRIST ARTHROSCOPY WITH ULNAR SHORTENING OSTEOTOMY;  Surgeon: Leandrew Koyanagi, MD;  Location: [place];  Service: Orthopedics;  Laterality: Right;   Social History   Occupational History    Employer: [employer]  Tobacco Use  . Smoking status: Never Smoker  . Smokeless tobacco: Never Used  Substance and Sexual Activity  . Alcohol use: No  . Drug use: No  . Sexual activity: Never

## 2017-08-26 NOTE — Telephone Encounter (Signed)
Please fax physical therapy order to [facility] on [facility]. Patient states they would not let her make appointment until the order was sent to them.

## 2017-08-30 ENCOUNTER — Telehealth: Payer: Self-pay | Admitting: Internal Medicine

## 2017-08-30 NOTE — Telephone Encounter (Signed)
Patient dropped off Disability Placard  Call patient for pick up at [phone]  Disposition: Dr's folder

## 2017-08-30 NOTE — Telephone Encounter (Signed)
Noted  

## 2017-08-30 NOTE — Telephone Encounter (Signed)
FAXED TO [facility]

## 2017-09-07 ENCOUNTER — Ambulatory Visit: Payer: Medicare Other | Admitting: Internal Medicine

## 2017-09-09 NOTE — Telephone Encounter (Signed)
Patient will pick form up when she comes to her appointment 09/13/2017

## 2017-09-13 ENCOUNTER — Telehealth: Payer: Self-pay

## 2017-09-13 ENCOUNTER — Ambulatory Visit (INDEPENDENT_AMBULATORY_CARE_PROVIDER_SITE_OTHER): Payer: Medicare Other | Admitting: Internal Medicine

## 2017-09-13 ENCOUNTER — Encounter: Payer: Self-pay | Admitting: Internal Medicine

## 2017-09-13 VITALS — BP 80/60 | HR 105 | Temp 98.3°F | Wt 245.0 lb

## 2017-09-13 DIAGNOSIS — G609 Hereditary and idiopathic neuropathy, unspecified: Secondary | ICD-10-CM

## 2017-09-13 DIAGNOSIS — R7989 Other specified abnormal findings of blood chemistry: Secondary | ICD-10-CM | POA: Diagnosis not present

## 2017-09-13 DIAGNOSIS — R000 Tachycardia, unspecified: Secondary | ICD-10-CM

## 2017-09-13 DIAGNOSIS — I10 Essential (primary) hypertension: Secondary | ICD-10-CM | POA: Diagnosis not present

## 2017-09-13 DIAGNOSIS — R079 Chest pain, unspecified: Secondary | ICD-10-CM | POA: Diagnosis not present

## 2017-09-13 LAB — TSH: TSH: 0.73 u[IU]/mL (ref 0.35–4.50)

## 2017-09-13 LAB — T4, FREE: FREE T4: 0.65 ng/dL (ref 0.60–1.60)

## 2017-09-13 NOTE — Progress Notes (Signed)
Subjective:    Patient ID: Katherine [name], female    DOB: [birth year], 67 y.o.   MRN: [id number]  HPI  67 year old patient who presents with a several month history of right upper chest discomfort.  She describes this as an achy sensation but for the past 2 to 3 weeks she is also had some sharp discomfort in the same location.  Symptoms are paroxysmal and not associated with activities such as walking.  She denies any exertional chest pain or shortness of breath. She also has a history of tachycardia. She stated the surgery was delayed in November due to increased pulse rate.   Past Medical History:  Diagnosis Date  . Dental crowns present   . History of kidney stones   . Hypertension    states under control with meds., has been on med. x 20 yr.  . Osteoarthritis   . Peripheral neuropathy    hands and feet  . Triangular fibrocartilage complex tear 06/2017   right  . 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 Wears partial dentures    upper and lower     Social History   Socioeconomic History  . Marital status: Single    Spouse name: Not on file  . Number of children: 1  . Years of education: Not on file  . Highest education level: Not on file  Occupational History    Employer: [employer]  Social Needs  . Financial resource strain: Not on file  . Food insecurity:    Worry: Not on file    Inability: Not on file  . Transportation needs:    Medical: Not on file    Non-medical: Not on file  Tobacco Use  . Smoking status: Never Smoker  . Smokeless tobacco: Never Used  Substance and Sexual Activity  . Alcohol use: No  . Drug use: No  . Sexual activity: Never  Lifestyle  . Physical activity:    Days per week: Not on file    Minutes per session: Not on file  . Stress: Not on file  Relationships  . Social connections:    Talks on phone: Not on file    Gets together: Not on file    Attends religious service: Not on file    Active member of club or organization:  Not on file    Attends meetings of clubs or organizations: Not on file    Relationship status: Not on file  . Intimate partner violence:    Fear of current or ex partner: Not on file    Emotionally abused: Not on file    Physically abused: Not on file    Forced sexual activity: Not on file  Other Topics Concern  . Not on file  Social History Narrative   Patient is single, has 1 child   Patient is right handed   Caffeine consumption is 0    Past Surgical History:  Procedure Laterality Date  . ABDOMINAL HYSTERECTOMY  1991   partial  . CARPAL TUNNEL RELEASE Right 08/09/2002  . CARPAL TUNNEL RELEASE Left 11/03/2006  . COLONOSCOPY WITH PROPOFOL  11/24/2011  . CYSTOSCOPY WITH RETROGRADE PYELOGRAM, URETEROSCOPY AND STENT PLACEMENT Left 12/18/2014   Procedure: CYSTOSCOPY WITH RETROGRADE PYELOGRAM, URETEROSCOPY AND STENT PLACEMENT left ureter;  Surgeon: Raynelle Bring, MD;  [facility];  Service: Urology;  Laterality: Left;  . DILATION AND CURETTAGE OF UTERUS    . ELBOW ARTHROSCOPY Right   . EXTRACORPOREAL SHOCK WAVE LITHOTRIPSY Left 03/08/2017  Procedure: LEFT EXTRACORPOREAL SHOCK WAVE LITHOTRIPSY (ESWL);  Surgeon: Raynelle Bring, MD;  [facility];  Service: Urology;  Laterality: Left;  . HOLMIUM LASER APPLICATION Left 01/12/8545   Procedure: HOLMIUM LASER APPLICATION left ureter ;  Surgeon: Raynelle Bring, MD;  [facility];  Service: Urology;  Laterality: Left;  . PARATHYROIDECTOMY Right 05/03/2015   Procedure: RIGHT SUPERIOR PARATHYROIDECTOMY;  Surgeon: Armandina Gemma, MD;  Location: [place];  Service: General;  Laterality: Right;  . PATELLA REALIGNMENT Bilateral   . REPLACEMENT TOTAL KNEE BILATERAL    . ROTATOR CUFF REPAIR Right    x2  . SHOULDER ARTHROSCOPY Right    x 2  . SHOULDER ARTHROSCOPY W/ ROTATOR CUFF REPAIR Left 04/26/2002  . TONSILLECTOMY    . TOTAL KNEE ARTHROPLASTY Left 09/10/2003  . TOTAL KNEE ARTHROPLASTY Right 11/10/2004  . TRIGGER FINGER RELEASE Right  08/06/2008   ring finger  . TRIGGER FINGER RELEASE     multiple - right thumb, left index/long/ring/thumb  . ULNA OSTEOTOMY Right 07/28/2017   Procedure: REVISION RIGHT ULNAR SHORTENING OSTEOTOMY;  Surgeon: Leandrew Koyanagi, MD;  Location: [place];  Service: Orthopedics;  Laterality: Right;  . WRIST ARTHROSCOPY Left 06/16/2000  . WRIST ARTHROSCOPY WITH ULNA SHORTENING Right 07/07/2017   Procedure: RIGHT WRIST ARTHROSCOPY WITH ULNAR SHORTENING OSTEOTOMY;  Surgeon: Leandrew Koyanagi, MD;  Location: [place];  Service: Orthopedics;  Laterality: Right;    Family History  Problem Relation Age of Onset  . Stroke Mother   . Cancer Mother 70       bone cancer  . Diabetes Sister     Allergies  Allergen Reactions  . Erythromycin Other (See Comments)    CHEST PAIN    Current Outpatient Medications on File Prior to Visit  Medication Sig Dispense Refill  . b complex vitamins tablet Take 1 tablet by mouth daily.    . calcium-vitamin D (OSCAL WITH D) 500-200 MG-UNIT tablet Take 1 tablet by mouth 3 (three) times daily. 90 tablet 12  . cholecalciferol (VITAMIN D) 1000 units tablet Take 2,000 Units by mouth daily.    . hydrochlorothiazide (HYDRODIURIL) 12.5 MG tablet Take 12.5 mg by mouth daily.    Marland Kitchen L-Methylfolate-Algae-B12-B6 (METANX) 3-90.314-2-35 MG CAPS Take by mouth 2 (two) times daily.    Marland Kitchen lidocaine-prilocaine (EMLA) cream Apply four times per day as needed. 270 g 3  . lisinopril (PRINIVIL,ZESTRIL) 10 MG tablet TAKE 1 TABLET BY MOUTH  DAILY 90 tablet 1  . Multiple Vitamin (MULTIVITAMIN) tablet Take 1 tablet by mouth daily.    . nortriptyline (PAMELOR) 25 MG capsule Take 3 capsules (75 mg total) by mouth at bedtime. 270 capsule 4  . pregabalin (LYRICA) 100 MG capsule Take 1 capsule (100 mg total) 2 (two) times daily by mouth. Please call [phone] to schedule an appt. 180 capsule 2  . traMADol (ULTRAM) 50 MG tablet Take one to two tablets by mouth every 6 hours if  needed for pain, Prescribed by Neurology 180 tablet 4  . [DISCONTINUED] gabapentin (NEURONTIN) 300 MG capsule Take 300 mg by mouth 3 (three) times daily.       No current facility-administered medications on file prior to visit.     BP (!) 80/60 (BP Location: Right Arm, Patient Position: Sitting, Cuff Size: Large)   Pulse (!) 105   Temp 98.3 F (36.8 C) (Oral)   Wt 245 lb (111.1 kg)   SpO2 98%   BMI 42.05 kg/m    Review  of Systems  Constitutional: Negative.   HENT: Negative for congestion, dental problem, hearing loss, rhinorrhea, sinus pressure, sore throat and tinnitus.   Eyes: Negative for pain, discharge and visual disturbance.  Respiratory: Negative for cough and shortness of breath.   Cardiovascular: Positive for chest pain. Negative for palpitations and leg swelling.  Gastrointestinal: Negative for abdominal distention, abdominal pain, blood in stool, constipation, diarrhea, nausea and vomiting.  Genitourinary: Negative for difficulty urinating, dysuria, flank pain, frequency, hematuria, pelvic pain, urgency, vaginal bleeding, vaginal discharge and vaginal pain.  Musculoskeletal: Negative for arthralgias, gait problem and joint swelling.  Skin: Negative for rash.  Neurological: Negative for dizziness, syncope, speech difficulty, weakness, numbness and headaches.  Hematological: Negative for adenopathy.  Psychiatric/Behavioral: Negative for agitation, behavioral problems and dysphoric mood. The patient is not nervous/anxious.        Objective:   Physical Exam  Constitutional: She is oriented to person, place, and time. She appears well-developed and well-nourished. No distress.  Blood pressure 110/70 Pulse approximately 100 O2 saturation 98%    HENT:  Head: Normocephalic.  Right Ear: External ear normal.  Left Ear: External ear normal.  Mouth/Throat: Oropharynx is clear and moist.  Eyes: Pupils are equal, round, and reactive to light. Conjunctivae and EOM are normal.   Neck: Normal range of motion. Neck supple. No thyromegaly present.  Cardiovascular: Normal rate, regular rhythm, normal heart sounds and intact distal pulses.  Pulmonary/Chest: Effort normal and breath sounds normal. No stridor. No respiratory distress. She has no wheezes. She has no rales. She exhibits no tenderness.  No chest wall tenderness  Abdominal: Soft. Bowel sounds are normal. She exhibits no mass. There is no tenderness.  Musculoskeletal: Normal range of motion. She exhibits edema.  No swelling or calf tenderness  Lymphadenopathy:    She has no cervical adenopathy.  Neurological: She is alert and oriented to person, place, and time.  Skin: Skin is warm and dry. No rash noted.  Psychiatric: She has a normal mood and affect. Her behavior is normal.          Assessment & Plan:  Subacute right anterior chest pain Chronic tachycardia  EKG reviewed this revealed a sinus tachycardia at the rate of 100 low voltage but otherwise no abnormalities  We will check a d-dimer CBC as well as thyroid function studies  Nyoka Cowden

## 2017-09-13 NOTE — Patient Instructions (Signed)
Limit your sodium (Salt) intake  Call or return to clinic prn if these symptoms worsen or fail to improve as anticipated.   

## 2017-09-14 ENCOUNTER — Other Ambulatory Visit: Payer: Self-pay

## 2017-09-14 DIAGNOSIS — R7889 Finding of other specified substances, not normally found in blood: Secondary | ICD-10-CM

## 2017-09-14 LAB — D-DIMER, QUANTITATIVE (NOT AT ARMC): D DIMER QUANT: 0.95 ug{FEU}/mL — AB (ref <0.50)

## 2017-09-14 NOTE — Progress Notes (Signed)
Referral placed.

## 2017-09-15 ENCOUNTER — Other Ambulatory Visit: Payer: Self-pay

## 2017-09-15 ENCOUNTER — Other Ambulatory Visit (INDEPENDENT_AMBULATORY_CARE_PROVIDER_SITE_OTHER): Payer: Medicare Other

## 2017-09-15 DIAGNOSIS — Z01812 Encounter for preprocedural laboratory examination: Secondary | ICD-10-CM

## 2017-09-15 LAB — BASIC METABOLIC PANEL
BUN: 14 mg/dL (ref 6–23)
CALCIUM: 10.1 mg/dL (ref 8.4–10.5)
CHLORIDE: 104 meq/L (ref 96–112)
CO2: 29 mEq/L (ref 19–32)
CREATININE: 0.85 mg/dL (ref 0.40–1.20)
GFR: 85.43 mL/min (ref >60.00)
Glucose, Bld: 116 mg/dL — ABNORMAL HIGH (ref 70–99)
Potassium: 3.8 mEq/L (ref 3.5–5.1)
Sodium: 140 mEq/L (ref 135–145)

## 2017-09-15 LAB — CREATININE, SERUM: CREATININE: 0.87 mg/dL (ref 0.40–1.20)

## 2017-09-15 LAB — BUN: BUN: 14 mg/dL (ref 6–23)

## 2017-09-15 NOTE — Addendum Note (Signed)
Addended by: Gwenyth Ober R on: 09/15/2017 12:28 PM   Modules accepted: Orders

## 2017-09-15 NOTE — Addendum Note (Signed)
Addended by: Gwenyth Ober R on: 09/15/2017 11:52 AM   Modules accepted: Orders

## 2017-09-16 ENCOUNTER — Ambulatory Visit (INDEPENDENT_AMBULATORY_CARE_PROVIDER_SITE_OTHER): Payer: Medicare Other | Admitting: Orthopaedic Surgery

## 2017-09-16 ENCOUNTER — Telehealth: Payer: Self-pay | Admitting: Family Medicine

## 2017-09-16 ENCOUNTER — Ambulatory Visit (INDEPENDENT_AMBULATORY_CARE_PROVIDER_SITE_OTHER)
Admission: RE | Admit: 2017-09-16 | Discharge: 2017-09-16 | Disposition: A | Discharge status: Home / Self Care | Payer: Medicare Other | Source: Ambulatory Visit | Attending: Internal Medicine | Admitting: Internal Medicine

## 2017-09-16 DIAGNOSIS — R7989 Other specified abnormal findings of blood chemistry: Secondary | ICD-10-CM | POA: Diagnosis not present

## 2017-09-16 MED ORDER — IOPAMIDOL (ISOVUE-370) INJECTION 76%
80.0000 mL | Freq: Once | INTRAVENOUS | Status: AC | PRN
  Administered 2017-09-16: 80 mL via INTRAVENOUS

## 2017-09-16 NOTE — Telephone Encounter (Signed)
Stacey from [facility] called to report scan was negative. Please call patient with results.

## 2017-09-16 NOTE — Telephone Encounter (Signed)
Pt already notified! No further action needed.

## 2017-09-17 ENCOUNTER — Telehealth: Payer: Self-pay | Admitting: Family Medicine

## 2017-09-17 NOTE — Telephone Encounter (Signed)
Copied from [place] [phone]. Topic: Inquiry >> Sep 17, 2017  3:55 PM Pricilla Handler wrote: Reason for CRM: Pain in Upper Right Breast persists. It is a sharp, pressing, pain. Patient keeping her current appt. Patient wants Dr. Raliegh Ip to give her a call today.        Thank You!!!

## 2017-09-21 NOTE — Telephone Encounter (Signed)
Spoke to pt and she stated that it happened Thursday night and was told that it may be neuropathy. Pt would like an appointment in the am. Will discuss then!

## 2017-09-22 ENCOUNTER — Encounter: Payer: Self-pay | Admitting: Internal Medicine

## 2017-09-22 ENCOUNTER — Ambulatory Visit (INDEPENDENT_AMBULATORY_CARE_PROVIDER_SITE_OTHER): Payer: Medicare Other | Admitting: Internal Medicine

## 2017-09-22 VITALS — BP 90/60 | HR 105 | Temp 98.4°F | Wt 247.0 lb

## 2017-09-22 DIAGNOSIS — I10 Essential (primary) hypertension: Secondary | ICD-10-CM

## 2017-09-22 DIAGNOSIS — E785 Hyperlipidemia, unspecified: Secondary | ICD-10-CM | POA: Diagnosis not present

## 2017-09-22 DIAGNOSIS — R7302 Impaired glucose tolerance (oral): Secondary | ICD-10-CM | POA: Diagnosis not present

## 2017-09-22 MED ORDER — TRAMADOL HCL 50 MG PO TABS: ORAL_TABLET | ORAL | 4 refills | Status: DC

## 2017-09-22 NOTE — Patient Instructions (Signed)
Limit your sodium (Salt) intake  Please check your blood pressure on a regular basis.  If it is consistently greater than 150/90, please make an office appointment.  Return in 3 months for follow-up  

## 2017-09-22 NOTE — Progress Notes (Signed)
Subjective:    Patient ID: Katherine [name], female    DOB: [birth year], 67 y.o.   MRN: [id number]  HPI  67 year old patient who is seen today in follow-up.  6 days ago she sustained some right-sided chest discomfort but initially felt like a pressure sensation and then became a very sharp shooting pain.  She is seen in the office for evaluation of similar pain which included a d-dimer that was slightly elevated.  A subsequent chest CT scan unremarkable. Patient suffers from a severe peripheral neuropathy.  She feels that the right anterior chest wall pain is very similar to her foot pain  No exertional symptoms  Past Medical History:  Diagnosis Date  . Dental crowns present   . History of kidney stones   . Hypertension    states under control with meds., has been on med. x 20 yr.  . Osteoarthritis   . Peripheral neuropathy    hands and feet  . Triangular fibrocartilage complex tear 06/2017   right  . 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 Wears partial dentures    upper and lower     Social History   Socioeconomic History  . Marital status: Single    Spouse name: Not on file  . Number of children: 1  . Years of education: Not on file  . Highest education level: Not on file  Occupational History    Employer: [employer]  Social Needs  . Financial resource strain: Not on file  . Food insecurity:    Worry: Not on file    Inability: Not on file  . Transportation needs:    Medical: Not on file    Non-medical: Not on file  Tobacco Use  . Smoking status: Never Smoker  . Smokeless tobacco: Never Used  Substance and Sexual Activity  . Alcohol use: No  . Drug use: No  . Sexual activity: Never  Lifestyle  . Physical activity:    Days per week: Not on file    Minutes per session: Not on file  . Stress: Not on file  Relationships  . Social connections:    Talks on phone: Not on file    Gets together: Not on file    Attends religious service: Not on file   Active member of club or organization: Not on file    Attends meetings of clubs or organizations: Not on file    Relationship status: Not on file  . Intimate partner violence:    Fear of current or ex partner: Not on file    Emotionally abused: Not on file    Physically abused: Not on file    Forced sexual activity: Not on file  Other Topics Concern  . Not on file  Social History Narrative   Patient is single, has 1 child   Patient is right handed   Caffeine consumption is 0    Past Surgical History:  Procedure Laterality Date  . ABDOMINAL HYSTERECTOMY  1991   partial  . CARPAL TUNNEL RELEASE Right 08/09/2002  . CARPAL TUNNEL RELEASE Left 11/03/2006  . COLONOSCOPY WITH PROPOFOL  11/24/2011  . CYSTOSCOPY WITH RETROGRADE PYELOGRAM, URETEROSCOPY AND STENT PLACEMENT Left 12/18/2014   Procedure: CYSTOSCOPY WITH RETROGRADE PYELOGRAM, URETEROSCOPY AND STENT PLACEMENT left ureter;  Surgeon: Raynelle Bring, MD;  [facility];  Service: Urology;  Laterality: Left;  . DILATION AND CURETTAGE OF UTERUS    . ELBOW ARTHROSCOPY Right   . EXTRACORPOREAL SHOCK WAVE LITHOTRIPSY Left 03/08/2017  Procedure: LEFT EXTRACORPOREAL SHOCK WAVE LITHOTRIPSY (ESWL);  Surgeon: Raynelle Bring, MD;  [facility];  Service: Urology;  Laterality: Left;  . HOLMIUM LASER APPLICATION Left 0/05/930   Procedure: HOLMIUM LASER APPLICATION left ureter ;  Surgeon: Raynelle Bring, MD;  [facility];  Service: Urology;  Laterality: Left;  . PARATHYROIDECTOMY Right 05/03/2015   Procedure: RIGHT SUPERIOR PARATHYROIDECTOMY;  Surgeon: Armandina Gemma, MD;  Location: [facility];  Service: General;  Laterality: Right;  . PATELLA REALIGNMENT Bilateral   . REPLACEMENT TOTAL KNEE BILATERAL    . ROTATOR CUFF REPAIR Right    x2  . SHOULDER ARTHROSCOPY Right    x 2  . SHOULDER ARTHROSCOPY W/ ROTATOR CUFF REPAIR Left 04/26/2002  . TONSILLECTOMY    . TOTAL KNEE ARTHROPLASTY Left 09/10/2003  . TOTAL KNEE ARTHROPLASTY Right 11/10/2004   . TRIGGER FINGER RELEASE Right 08/06/2008   ring finger  . TRIGGER FINGER RELEASE     multiple - right thumb, left index/long/ring/thumb  . ULNA OSTEOTOMY Right 07/28/2017   Procedure: REVISION RIGHT ULNAR SHORTENING OSTEOTOMY;  Surgeon: Leandrew Koyanagi, MD;  Location: [facility];  Service: Orthopedics;  Laterality: Right;  . WRIST ARTHROSCOPY Left 06/16/2000  . WRIST ARTHROSCOPY WITH ULNA SHORTENING Right 07/07/2017   Procedure: RIGHT WRIST ARTHROSCOPY WITH ULNAR SHORTENING OSTEOTOMY;  Surgeon: Leandrew Koyanagi, MD;  Location: [facility];  Service: Orthopedics;  Laterality: Right;    Family History  Problem Relation Age of Onset  . Stroke Mother   . Cancer Mother 51       bone cancer  . Diabetes Sister     Allergies  Allergen Reactions  . Erythromycin Other (See Comments)    CHEST PAIN    Current Outpatient Medications on File Prior to Visit  Medication Sig Dispense Refill  . b complex vitamins tablet Take 1 tablet by mouth daily.    . calcium-vitamin D (OSCAL WITH D) 500-200 MG-UNIT tablet Take 1 tablet by mouth 3 (three) times daily. 90 tablet 12  . cholecalciferol (VITAMIN D) 1000 units tablet Take 2,000 Units by mouth daily.    . hydrochlorothiazide (HYDRODIURIL) 12.5 MG tablet Take 12.5 mg by mouth daily.    Marland Kitchen L-Methylfolate-Algae-B12-B6 (METANX) 3-90.314-2-35 MG CAPS Take by mouth 2 (two) times daily.    Marland Kitchen lidocaine-prilocaine (EMLA) cream Apply four times per day as needed. 270 g 3  . lisinopril (PRINIVIL,ZESTRIL) 10 MG tablet TAKE 1 TABLET BY MOUTH  DAILY 90 tablet 1  . Multiple Vitamin (MULTIVITAMIN) tablet Take 1 tablet by mouth daily.    . nortriptyline (PAMELOR) 25 MG capsule Take 3 capsules (75 mg total) by mouth at bedtime. 270 capsule 4  . pregabalin (LYRICA) 100 MG capsule Take 1 capsule (100 mg total) 2 (two) times daily by mouth. Please call [phone] to schedule an appt. 180 capsule 2  . traMADol (ULTRAM) 50 MG tablet Take one to two  tablets by mouth every 6 hours if needed for pain, Prescribed by Neurology 180 tablet 4  . [DISCONTINUED] gabapentin (NEURONTIN) 300 MG capsule Take 300 mg by mouth 3 (three) times daily.       No current facility-administered medications on file prior to visit.     BP 90/60 (BP Location: Right Arm, Patient Position: Sitting, Cuff Size: Large)   Pulse (!) 105   Temp 98.4 F (36.9 C) (Oral)   Wt 247 lb (112 kg)   SpO2 97%   BMI 42.40 kg/m     Review  of Systems  Constitutional: Negative.   HENT: Negative for congestion, dental problem, hearing loss, rhinorrhea, sinus pressure, sore throat and tinnitus.   Eyes: Negative for pain, discharge and visual disturbance.  Respiratory: Negative for cough and shortness of breath.   Cardiovascular: Positive for chest pain. Negative for palpitations and leg swelling.  Gastrointestinal: Negative for abdominal distention, abdominal pain, blood in stool, constipation, diarrhea, nausea and vomiting.  Genitourinary: Negative for difficulty urinating, dysuria, flank pain, frequency, hematuria, pelvic pain, urgency, vaginal bleeding, vaginal discharge and vaginal pain.  Musculoskeletal: Negative for arthralgias, gait problem and joint swelling.  Skin: Negative for rash.  Neurological: Positive for numbness. Negative for dizziness, syncope, speech difficulty, weakness and headaches.  Hematological: Negative for adenopathy.  Psychiatric/Behavioral: Negative for agitation, behavioral problems and dysphoric mood. The patient is not nervous/anxious.        Objective:   Physical Exam  Constitutional: She is oriented to person, place, and time. She appears well-developed and well-nourished.   Blood pressure well controlled.  Weight 247  HENT:  Head: Normocephalic.  Right Ear: External ear normal.  Left Ear: External ear normal.  Mouth/Throat: Oropharynx is clear and moist.  Eyes: Pupils are equal, round, and reactive to light. Conjunctivae and EOM are  normal.  Neck: Normal range of motion. Neck supple. No thyromegaly present.  Cardiovascular: Normal rate, regular rhythm, normal heart sounds and intact distal pulses.  Pulmonary/Chest: Effort normal and breath sounds normal.  No chest wall pain.  Chronic resting tachycardia  Abdominal: Soft. Bowel sounds are normal. She exhibits no mass. There is no tenderness.  Musculoskeletal: Normal range of motion.  Lymphadenopathy:    She has no cervical adenopathy.  Neurological: She is alert and oriented to person, place, and time.  Skin: Skin is warm and dry. No rash noted.  Psychiatric: She has a normal mood and affect. Her behavior is normal.          Assessment & Plan:  Nonspecific chest wall pain Severe peripheral neuropathy Essential hypertension stable  Patient has had a fairly full evaluation including EKG and CT scan of the chest.  Pain does not sound ischemic we will continue present regimen  Nyoka Cowden

## 2017-09-22 NOTE — Telephone Encounter (Signed)
Please schedule appointment.

## 2017-09-24 ENCOUNTER — Encounter (INDEPENDENT_AMBULATORY_CARE_PROVIDER_SITE_OTHER): Payer: Self-pay | Admitting: Orthopaedic Surgery

## 2017-09-24 ENCOUNTER — Ambulatory Visit (INDEPENDENT_AMBULATORY_CARE_PROVIDER_SITE_OTHER): Payer: Medicare Other

## 2017-09-24 ENCOUNTER — Ambulatory Visit (INDEPENDENT_AMBULATORY_CARE_PROVIDER_SITE_OTHER): Payer: Medicare Other | Admitting: Orthopaedic Surgery

## 2017-09-24 DIAGNOSIS — M25831 Other specified joint disorders, right wrist: Secondary | ICD-10-CM | POA: Diagnosis not present

## 2017-09-24 NOTE — Progress Notes (Signed)
Post-Op Visit Note   Patient: Katherine [name]           Date of Birth: 1951/01/06           MRN: [id number] Visit Date: 09/24/2017 PCP: Marletta Lor, MD   Assessment & Plan:  Chief Complaint:  Chief Complaint  Patient presents with  . Right Wrist - Pain, Routine Post Op   Visit Diagnoses:  1. Ulnar impaction syndrome, right     Plan: Patient is a pleasant 67 year old female who presents to our clinic today 58 days status post revision right ulnar shortening osteotomy, date of surgery 07/28/2017.  She has been doing well.  She has finished outpatient physical therapy where she is regained near full motion.  She has continued to do home exercises to include resistance exercises in a pool.  Doing well with that.  Minimal pain.  Examination of the right forearm and wrist reveals a well-healed surgical incision without evidence of infection or cellulitis.  No tenderness.  Full range of motion of the wrist and elbow.  She is neurovascularly intact distally.  At this point, we will have the patient continue with her home exercise program.  Still no lifting more than 5 to 10 pounds.  Follow-up with [place] in 4 weeks time for repeat x-rays.  [name] with concerns or questions in the meantime.  Follow-Up Instructions: Return in about 1 month (around 10/22/2017).   Orders:  Orders Placed This Encounter  Procedures  . XR Forearm Right   No orders of the defined types were placed in this encounter.   Imaging: Xr Forearm Right  Result Date: 09/24/2017 X-rays show stable alignment of the fracture and hardware.  She does not yet have great bony consolidation at the site of the osteotomy   PMFS History: Patient Active Problem List   Diagnosis Date Noted  . Closed displaced oblique fracture of shaft of ulna with malunion, subsequent encounter 07/28/2017  . 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 Pain in right wrist 06/01/2017  . Trigger  finger, right index finger 09/28/2016  . Impaired glucose tolerance 09/09/2015  . Hyperparathyroidism ([place]) 05/26/2013  . Hereditary and idiopathic peripheral neuropathy 12/29/2012  . Obese   . Menopausal symptoms 11/10/2011  . DIVERTICULOSIS, COLON 11/02/2007  . Dyslipidemia 03/09/2006  . Essential hypertension 03/09/2006  . NEPHROLITHIASIS, HX OF 03/09/2006   Past Medical History:  Diagnosis Date  . Dental crowns present   . History of kidney stones   . Hypertension    states under control with meds., has been on med. x 20 yr.  . Osteoarthritis   . Peripheral neuropathy    hands and feet  . Triangular fibrocartilage complex tear 06/2017   right  . 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 Wears partial dentures    upper and lower    Family History  Problem Relation Age of Onset  . Stroke Mother   . Cancer Mother 47       bone cancer  . Diabetes Sister     Past Surgical History:  Procedure Laterality Date  . ABDOMINAL HYSTERECTOMY  1991   partial  . CARPAL TUNNEL RELEASE Right 08/09/2002  . CARPAL TUNNEL RELEASE Left 11/03/2006  . COLONOSCOPY WITH PROPOFOL  11/24/2011  . CYSTOSCOPY WITH RETROGRADE PYELOGRAM, URETEROSCOPY AND STENT PLACEMENT Left 12/18/2014   Procedure: CYSTOSCOPY WITH RETROGRADE PYELOGRAM, URETEROSCOPY AND STENT PLACEMENT left ureter;  Surgeon: Raynelle Bring, MD;  [facility];  Service: Urology;  Laterality: Left;  . DILATION AND CURETTAGE OF UTERUS    . ELBOW ARTHROSCOPY Right   . EXTRACORPOREAL SHOCK WAVE LITHOTRIPSY Left 03/08/2017   Procedure: LEFT EXTRACORPOREAL SHOCK WAVE LITHOTRIPSY (ESWL);  Surgeon: Raynelle Bring, MD;  [facility];  Service: Urology;  Laterality: Left;  . HOLMIUM LASER APPLICATION Left 09/10/8411   Procedure: HOLMIUM LASER APPLICATION left ureter ;  Surgeon: Raynelle Bring, MD;  [facility];  Service: Urology;  Laterality: Left;  . PARATHYROIDECTOMY Right 05/03/2015   Procedure: RIGHT SUPERIOR PARATHYROIDECTOMY;   Surgeon: Armandina Gemma, MD;  Location: [facility];  Service: General;  Laterality: Right;  . PATELLA REALIGNMENT Bilateral   . REPLACEMENT TOTAL KNEE BILATERAL    . ROTATOR CUFF REPAIR Right    x2  . SHOULDER ARTHROSCOPY Right    x 2  . SHOULDER ARTHROSCOPY W/ ROTATOR CUFF REPAIR Left 04/26/2002  . TONSILLECTOMY    . TOTAL KNEE ARTHROPLASTY Left 09/10/2003  . TOTAL KNEE ARTHROPLASTY Right 11/10/2004  . TRIGGER FINGER RELEASE Right 08/06/2008   ring finger  . TRIGGER FINGER RELEASE     multiple - right thumb, left index/long/ring/thumb  . ULNA OSTEOTOMY Right 07/28/2017   Procedure: REVISION RIGHT ULNAR SHORTENING OSTEOTOMY;  Surgeon: Leandrew Koyanagi, MD;  Location: [facility];  Service: Orthopedics;  Laterality: Right;  . WRIST ARTHROSCOPY Left 06/16/2000  . WRIST ARTHROSCOPY WITH ULNA SHORTENING Right 07/07/2017   Procedure: RIGHT WRIST ARTHROSCOPY WITH ULNAR SHORTENING OSTEOTOMY;  Surgeon: Leandrew Koyanagi, MD;  Location: [facility];  Service: Orthopedics;  Laterality: Right;   Social History   Occupational History    Employer: [employer]  Tobacco Use  . Smoking status: Never Smoker  . Smokeless tobacco: Never Used  Substance and Sexual Activity  . Alcohol use: No  . Drug use: No  . Sexual activity: Never

## 2017-10-20 LAB — HM MAMMOGRAPHY: HM MAMMO: NORMAL (ref 0–4)

## 2017-10-22 ENCOUNTER — Ambulatory Visit (INDEPENDENT_AMBULATORY_CARE_PROVIDER_SITE_OTHER): Payer: Medicare Other

## 2017-10-22 ENCOUNTER — Ambulatory Visit (INDEPENDENT_AMBULATORY_CARE_PROVIDER_SITE_OTHER): Payer: Medicare Other | Admitting: Orthopaedic Surgery

## 2017-10-22 ENCOUNTER — Encounter (INDEPENDENT_AMBULATORY_CARE_PROVIDER_SITE_OTHER): Payer: Self-pay | Admitting: Orthopaedic Surgery

## 2017-10-22 DIAGNOSIS — M25831 Other specified joint disorders, right wrist: Secondary | ICD-10-CM | POA: Diagnosis not present

## 2017-10-22 DIAGNOSIS — Z6841 Body Mass Index (BMI) 40.0 and over, adult: Secondary | ICD-10-CM | POA: Insufficient documentation

## 2017-10-22 NOTE — Progress Notes (Signed)
Post-Op Visit Note   Patient: Katherine [name]           Date of Birth: 07/31/1950           MRN: [id number] Visit Date: 10/22/2017 PCP: Marletta Lor, MD   Assessment & Plan:  Chief Complaint:  Chief Complaint  Patient presents with  . Right Arm - Routine Post Op   Visit Diagnoses:  1. Ulnar impaction syndrome, right   2. Morbid obesity ([place])   3. Body mass index 40.0-44.9, adult [facility])     Plan: Patient is a pleasant 67 year old female who presents to our clinic today 86 days status post revision right ulnar shortening osteotomy, date of surgery 07/28/2017.  She has been doing fairly well.  She has finished hand therapy where she is regained near full motion and strength.  She does admit to still having pain and burning sensation but also has neuropathy.  She has been working with her hands and water aerobics which does seem to help.  No fevers, chills or any other systemic symptoms.  Examination of her right forearm reveals a well-healed surgical incision without evidence of infection.  She does have mild tenderness to the mid incision.  Full supination, pronation.  At this point, we will continue with her restrictions of lifting no more than 5 to 10 pounds.  She will follow-up with [place] in 6 weeks time for repeat evaluation and x-ray.  Follow-Up Instructions: Return in about 6 weeks (around 12/03/2017).   Orders:  Orders Placed This Encounter  Procedures  . XR Forearm Right   No orders of the defined types were placed in this encounter.   Imaging: Xr Forearm Right  Result Date: 10/22/2017 Stable alignment of the fracture and hardware.  There does appear to be slightly increased callus formation at the mid aspect of the fracture   PMFS History: Patient Active Problem List   Diagnosis Date Noted  . Body mass index 40.0-44.9, adult ([place]) 10/22/2017  . Closed displaced oblique fracture of shaft of ulna with malunion, subsequent encounter 07/28/2017  . Ulnar impaction  syndrome, right 07/07/2017  . 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 Pain in right wrist 06/01/2017  . Trigger finger, right index finger 09/28/2016  . Impaired glucose tolerance 09/09/2015  . Hyperparathyroidism ([place]) 05/26/2013  . Hereditary and idiopathic peripheral neuropathy 12/29/2012  . Morbid obesity ([place])   . Menopausal symptoms 11/10/2011  . DIVERTICULOSIS, COLON 11/02/2007  . Dyslipidemia 03/09/2006  . Essential hypertension 03/09/2006  . NEPHROLITHIASIS, HX OF 03/09/2006   Past Medical History:  Diagnosis Date  . Dental crowns present   . History of kidney stones   . Hypertension    states under control with meds., has been on med. x 20 yr.  . Osteoarthritis   . Peripheral neuropathy    hands and feet  . Triangular fibrocartilage complex tear 06/2017   right  . 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 Wears partial dentures    upper and lower    Family History  Problem Relation Age of Onset  . Stroke Mother   . Cancer Mother 37       bone cancer  . Diabetes Sister     Past Surgical History:  Procedure Laterality Date  . ABDOMINAL HYSTERECTOMY  1991   partial  . CARPAL TUNNEL RELEASE Right 08/09/2002  . CARPAL TUNNEL RELEASE Left 11/03/2006  . COLONOSCOPY WITH PROPOFOL  11/24/2011  . CYSTOSCOPY WITH RETROGRADE PYELOGRAM, URETEROSCOPY AND STENT PLACEMENT  Left 12/18/2014   Procedure: CYSTOSCOPY WITH RETROGRADE PYELOGRAM, URETEROSCOPY AND STENT PLACEMENT left ureter;  Surgeon: Raynelle Bring, MD;  [facility];  Service: Urology;  Laterality: Left;  . DILATION AND CURETTAGE OF UTERUS    . ELBOW ARTHROSCOPY Right   . EXTRACORPOREAL SHOCK WAVE LITHOTRIPSY Left 03/08/2017   Procedure: LEFT EXTRACORPOREAL SHOCK WAVE LITHOTRIPSY (ESWL);  Surgeon: Raynelle Bring, MD;  [facility];  Service: Urology;  Laterality: Left;  . HOLMIUM LASER APPLICATION Left 05/18/8414   Procedure: HOLMIUM LASER APPLICATION left ureter ;  Surgeon: Raynelle Bring, MD;  [facility];  Service: Urology;  Laterality: Left;  . PARATHYROIDECTOMY Right 05/03/2015   Procedure: RIGHT SUPERIOR PARATHYROIDECTOMY;  Surgeon: Armandina Gemma, MD;  Location: [place];  Service: General;  Laterality: Right;  . PATELLA REALIGNMENT Bilateral   . REPLACEMENT TOTAL KNEE BILATERAL    . ROTATOR CUFF REPAIR Right    x2  . SHOULDER ARTHROSCOPY Right    x 2  . SHOULDER ARTHROSCOPY W/ ROTATOR CUFF REPAIR Left 04/26/2002  . TONSILLECTOMY    . TOTAL KNEE ARTHROPLASTY Left 09/10/2003  . TOTAL KNEE ARTHROPLASTY Right 11/10/2004  . TRIGGER FINGER RELEASE Right 08/06/2008   ring finger  . TRIGGER FINGER RELEASE     multiple - right thumb, left index/long/ring/thumb  . ULNA OSTEOTOMY Right 07/28/2017   Procedure: REVISION RIGHT ULNAR SHORTENING OSTEOTOMY;  Surgeon: Leandrew Koyanagi, MD;  Location: [place];  Service: Orthopedics;  Laterality: Right;  . WRIST ARTHROSCOPY Left 06/16/2000  . WRIST ARTHROSCOPY WITH ULNA SHORTENING Right 07/07/2017   Procedure: RIGHT WRIST ARTHROSCOPY WITH ULNAR SHORTENING OSTEOTOMY;  Surgeon: Leandrew Koyanagi, MD;  Location: [place];  Service: Orthopedics;  Laterality: Right;   Social History   Occupational History    Employer: [employer]  Tobacco Use  . Smoking status: Never Smoker  . Smokeless tobacco: Never Used  Substance and Sexual Activity  . Alcohol use: No  . Drug use: No  . Sexual activity: Never

## 2017-10-28 ENCOUNTER — Encounter: Payer: Self-pay | Admitting: Internal Medicine

## 2017-12-03 ENCOUNTER — Encounter (INDEPENDENT_AMBULATORY_CARE_PROVIDER_SITE_OTHER): Payer: Self-pay | Admitting: Orthopaedic Surgery

## 2017-12-03 ENCOUNTER — Ambulatory Visit (INDEPENDENT_AMBULATORY_CARE_PROVIDER_SITE_OTHER): Payer: Self-pay

## 2017-12-03 ENCOUNTER — Ambulatory Visit (INDEPENDENT_AMBULATORY_CARE_PROVIDER_SITE_OTHER): Payer: Medicare Other | Admitting: Orthopaedic Surgery

## 2017-12-03 VITALS — Ht 64.5 in | Wt 248.0 lb

## 2017-12-03 DIAGNOSIS — M25831 Other specified joint disorders, right wrist: Secondary | ICD-10-CM

## 2017-12-03 NOTE — Progress Notes (Signed)
Post-Op Visit Note   Patient: Katherine [name]           Date of Birth: 14-Nov-1950           MRN: [id number] Visit Date: 12/03/2017 PCP: Marletta Lor, MD   Assessment & Plan:  Chief Complaint:  Chief Complaint  Patient presents with  . Right Wrist - Follow-up   Visit Diagnoses:  1. Ulnar impaction syndrome, right     Plan: Patient is a pleasant 67 year old female who presents to our clinic today 4 months status post revision right ulnar shortening osteotomy, date of surgery 07/28/2017.  She has been doing fairly well.  She still admits to some soreness and sensitivity with cold.  She does have neuropathy in states this is attributed to that.  She has been working on range of motion strengthening while participating in water aerobics.  Overall doing much better.  Examination of the right forearm reveals a well-healed surgical incision without evidence of infection.  Very mild incisional tenderness.  Full range of motion and strength.   At this point, we will allow her to increase activity as tolerated.  Follow-up with [place] in 2 months time for final x-ray. Follow-Up Instructions: Return in about 2 months (around 02/03/2018).   Orders:  Orders Placed This Encounter  Procedures  . XR Wrist Complete Right   No orders of the defined types were placed in this encounter.   Imaging: Xr Wrist Complete Right  Result Date: 12/03/2017 Stable alignment of the fracture and hardware.   PMFS History: Patient Active Problem List   Diagnosis Date Noted  . Body mass index 40.0-44.9, adult ([facility]) 10/22/2017  . Closed displaced oblique fracture of shaft of ulna with malunion, subsequent encounter 07/28/2017  . 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 Pain in right wrist 06/01/2017  . Trigger finger, right index finger 09/28/2016  . Impaired glucose tolerance 09/09/2015  . Hyperparathyroidism ([place]) 05/26/2013  . Hereditary and idiopathic  peripheral neuropathy 12/29/2012  . Morbid obesity ([place])   . Menopausal symptoms 11/10/2011  . DIVERTICULOSIS, COLON 11/02/2007  . Dyslipidemia 03/09/2006  . Essential hypertension 03/09/2006  . NEPHROLITHIASIS, HX OF 03/09/2006   Past Medical History:  Diagnosis Date  . Dental crowns present   . History of kidney stones   . Hypertension    states under control with meds., has been on med. x 20 yr.  . Osteoarthritis   . Peripheral neuropathy    hands and feet  . Triangular fibrocartilage complex tear 06/2017   right  . 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 Wears partial dentures    upper and lower    Family History  Problem Relation Age of Onset  . Stroke Mother   . Cancer Mother 42       bone cancer  . Diabetes Sister     Past Surgical History:  Procedure Laterality Date  . ABDOMINAL HYSTERECTOMY  1991   partial  . CARPAL TUNNEL RELEASE Right 08/09/2002  . CARPAL TUNNEL RELEASE Left 11/03/2006  . COLONOSCOPY WITH PROPOFOL  11/24/2011  . CYSTOSCOPY WITH RETROGRADE PYELOGRAM, URETEROSCOPY AND STENT PLACEMENT Left 12/18/2014   Procedure: CYSTOSCOPY WITH RETROGRADE PYELOGRAM, URETEROSCOPY AND STENT PLACEMENT left ureter;  Surgeon: Raynelle Bring, MD;  Location: [facility];  Service: Urology;  Laterality: Left;  . DILATION AND CURETTAGE OF UTERUS    . ELBOW ARTHROSCOPY Right   . EXTRACORPOREAL SHOCK WAVE LITHOTRIPSY Left 03/08/2017   Procedure:  LEFT EXTRACORPOREAL SHOCK WAVE LITHOTRIPSY (ESWL);  Surgeon: Raynelle Bring, MD;  Location: [facility];  Service: Urology;  Laterality: Left;  . HOLMIUM LASER APPLICATION Left 01/10/99   Procedure: HOLMIUM LASER APPLICATION left ureter ;  Surgeon: Raynelle Bring, MD;  Location: [facility];  Service: Urology;  Laterality: Left;  . PARATHYROIDECTOMY Right 05/03/2015   Procedure: RIGHT SUPERIOR PARATHYROIDECTOMY;  Surgeon: Armandina Gemma, MD;  Location: [facility];  Service: General;  Laterality: Right;  . PATELLA REALIGNMENT Bilateral   . REPLACEMENT  TOTAL KNEE BILATERAL    . ROTATOR CUFF REPAIR Right    x2  . SHOULDER ARTHROSCOPY Right    x 2  . SHOULDER ARTHROSCOPY W/ ROTATOR CUFF REPAIR Left 04/26/2002  . TONSILLECTOMY    . TOTAL KNEE ARTHROPLASTY Left 09/10/2003  . TOTAL KNEE ARTHROPLASTY Right 11/10/2004  . TRIGGER FINGER RELEASE Right 08/06/2008   ring finger  . TRIGGER FINGER RELEASE     multiple - right thumb, left index/long/ring/thumb  . ULNA OSTEOTOMY Right 07/28/2017   Procedure: REVISION RIGHT ULNAR SHORTENING OSTEOTOMY;  Surgeon: Leandrew Koyanagi, MD;  Location: [facility];  Service: Orthopedics;  Laterality: Right;  . WRIST ARTHROSCOPY Left 06/16/2000  . WRIST ARTHROSCOPY WITH ULNA SHORTENING Right 07/07/2017   Procedure: RIGHT WRIST ARTHROSCOPY WITH ULNAR SHORTENING OSTEOTOMY;  Surgeon: Leandrew Koyanagi, MD;  Location: [facility];  Service: Orthopedics;  Laterality: Right;   Social History   Occupational History    Employer: [employer]  Tobacco Use  . Smoking status: Never Smoker  . Smokeless tobacco: Never Used  Substance and Sexual Activity  . Alcohol use: No  . Drug use: No  . Sexual activity: Never

## 2017-12-03 NOTE — Telephone Encounter (Signed)
Entered chart in error.

## 2017-12-20 ENCOUNTER — Other Ambulatory Visit: Payer: Self-pay | Admitting: Internal Medicine

## 2017-12-21 ENCOUNTER — Other Ambulatory Visit: Payer: Self-pay | Admitting: Internal Medicine

## 2017-12-22 NOTE — Telephone Encounter (Signed)
Dr. Raliegh Ip, please advise of refills for this pt.  Pt was last seen by you on 09/22/2017.  Pt is wanting the lyrica refilled--last filled 03/25/2017 for #180 tablets with no refills.   The other medication was for nortriptyline=#27- that was last filled on 11/23/2016. Please advise. Thanks

## 2017-12-30 ENCOUNTER — Telehealth: Payer: Self-pay

## 2017-12-30 NOTE — Telephone Encounter (Signed)
Copied from [place] [phone] [phone]. Topic: General - Other >> Dec 30, 2017  4:28 PM Mcneil, Ja-Kwan wrote: Reason for CRM: Pt states she is waiting on her Rx for LYRICA 100 MG capsule but the mail order has not come in as of yet. Pt asked if there are any samples that she can come by to pick up until her Rx is delivered. Pt requests call back. Cb# [phone]

## 2017-12-31 NOTE — Telephone Encounter (Signed)
Spoke to patient and advise her to call the mail order to see if they can give her a time frame for her Rx since she is completely out. Pt stated she will call them and call us by 4 pm if it has not arrived. Pt wanted a 7 day supply sent to her local pharmacy to hold her over if it does not come today by 4pm.

## 2017-12-31 NOTE — Telephone Encounter (Signed)
Left detailed message informing pt of update. 

## 2018-01-03 ENCOUNTER — Other Ambulatory Visit: Payer: Self-pay | Admitting: Internal Medicine

## 2018-01-03 NOTE — Telephone Encounter (Signed)
Okay for refill? Please advise 

## 2018-01-04 ENCOUNTER — Other Ambulatory Visit: Payer: Self-pay

## 2018-01-04 MED ORDER — PREGABALIN 100 MG PO CAPS: 100.0000 mg | ORAL_CAPSULE | Freq: Two times a day (BID) | ORAL | 2 refills | Status: DC

## 2018-01-04 NOTE — Telephone Encounter (Signed)
Spoke to patient and informed her of the Rx being resent due to mishandled by [employer]

## 2018-01-04 NOTE — Telephone Encounter (Signed)
Richard calling from [facility] is requesting another RX be sent of the patient LYRICA 100 MG capsule . [facility] told patient that [facility] tried to deliver several times, now they lost the RX. Please call to let [facility] know if it ok to fill again. CB# [phone] reference # [id number]

## 2018-01-06 ENCOUNTER — Telehealth: Payer: Self-pay | Admitting: Family Medicine

## 2018-01-06 NOTE — Telephone Encounter (Signed)
Copied from [place] [phone]. Topic: General - Other >> Jan 06, 2018  4:32 PM Mcneil, Ja-Kwan wrote: Reason for CRM: Pt states she spoke with [place]  and she told that the Rx for pregabalin (LYRICA) 100 MG capsule has not been received. Pt requests a call back. Cb# [phone]

## 2018-01-07 NOTE — Telephone Encounter (Signed)
Spoke to pharmacist at [facility] and was told the technician thought the Rx that I resent was a duplicate that was already taking care of. The pharmacist stated that they can send the Rx out that was already sent on 12/22/17 for faster delivery time. Pharmacist was aware that the pt has been out of this Rx for about 3 weeks now and is not happy. Pharmacist stated that she can do a rush delivery but more than likely will not be able to get it to her until 01/11/2018 due to the holidays but it may ship to her today. Pt will be notified of update by myself and I also advised [facility] to reach out to pt.

## 2018-01-07 NOTE — Telephone Encounter (Signed)
Rx was resent on 01/04/2018. Verbal orders were also given on the phone to [facility]. Will call pt and [facility].

## 2018-01-12 ENCOUNTER — Encounter: Payer: Self-pay | Admitting: Family Medicine

## 2018-01-12 ENCOUNTER — Ambulatory Visit (INDEPENDENT_AMBULATORY_CARE_PROVIDER_SITE_OTHER): Payer: Medicare Other | Admitting: Family Medicine

## 2018-01-12 VITALS — BP 100/68 | HR 106 | Temp 98.4°F | Ht 64.0 in | Wt 246.0 lb

## 2018-01-12 DIAGNOSIS — G629 Polyneuropathy, unspecified: Secondary | ICD-10-CM | POA: Diagnosis not present

## 2018-01-12 DIAGNOSIS — I10 Essential (primary) hypertension: Secondary | ICD-10-CM

## 2018-01-12 DIAGNOSIS — N951 Menopausal and female climacteric states: Secondary | ICD-10-CM | POA: Diagnosis not present

## 2018-01-12 DIAGNOSIS — Z87442 Personal history of urinary calculi: Secondary | ICD-10-CM

## 2018-01-12 NOTE — Progress Notes (Signed)
Subjective:    Patient ID: Katherine [name], female    DOB: 07-30-1950, 67 y.o.   MRN: [id number]  No chief complaint on file.   HPI Patient was seen today for f/u on chronic issues and TOC, previously seen by Dr. Inda Merlin.  HTN: -Taking lisinopril 10 mg daily for blood pressure. -Also taking hydrochlorothiazide 12.5 mg for renal calculi.  Neuropathy, severe: -Initially started in 2011 with numbness and itching.  Then progressed to pain. -Mostly in the hands and feet. -Pt endorses multiple hand surgeries which likely contributed to symptoms. -Taking Lyrica 100 mg BID, nortriptyline 75 mg nightly, Mentax, EMLA cream -In the past followed by neurology, however they did not have anything further to offer pt.  Hot flashes: -2/2 menopause. -Patient endorses hysterectomy at age 16 2/2 fibroids. -Followed by Dr. Leo Grosser, OB/GYN. -Has tried various OTC remedies without relief. -Insurance will not pay for HRT  History of kidney stones: -Followed by urology. -Drinking 4 to 516.9 ounce bottles of water per day -Started on HCTZ 12.5 mg to decrease occurrence -Also endorses parathyroid removal to see if there is an improvement in kidney stones.  Past surgical history: bilateral knee replacements, knee surgery as a child, trigger finger surgery, removal of cyst in hands, carpal tunnel release, right elbow surgery, right rotator cuff repair x2, left rotator cuff repair x1, recent shortening of ulna.  Patient followed by Dr. Erlinda Hong, Ortho for ulnar shortening, still having pain.  Pt doing PT/water exercises.  Endorses history of arthritis.  Past Medical History:  Diagnosis Date  . Dental crowns present   . History of kidney stones   . Hypertension    states under control with meds., has been on med. x 20 yr.  . Osteoarthritis   . Peripheral neuropathy    hands and feet  . Triangular fibrocartilage complex tear 06/2017   right  . 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 Wears partial  dentures    upper and lower    Allergies  Allergen Reactions  . Erythromycin Other (See Comments)    CHEST PAIN    ROS General: Denies fever, chills, night sweats, changes in weight, changes in appetite HEENT: Denies headaches, ear pain, changes in vision, rhinorrhea, sore throat CV: Denies CP, palpitations, SOB, orthopnea Pulm: Denies SOB, cough, wheezing GI: Denies abdominal pain, nausea, vomiting, diarrhea, constipation GU: Denies dysuria, hematuria, frequency, vaginal discharge Msk: Denies muscle cramps    +joint pains Neuro: Denies weakness  + burning, numbness, tingling in hands and feet Skin: Denies rashes, bruising Psych: Denies depression, anxiety, hallucinations     Objective:    Blood pressure 100/68, pulse (!) 106, temperature 98.4 F (36.9 C), temperature source Oral, height 5\' 4"  (1.626 m), weight 246 lb (111.6 kg), SpO2 96 %.   Gen. Pleasant, well-nourished, in no distress, normal affect   HEENT: [place]/AT, face symmetric, no scleral icterus, PERRLA, nares patent without drainage, pharynx without erythema or exudate. Lungs: no accessory muscle use, CTAB, no wheezes or rales Cardiovascular: RRR, no m/r/g, no peripheral edema Neuro:  A&Ox3, CN II-XII intact, normal gait Skin:  Warm, no lesions/ rash.  Patient wearing nitrile gloves with fitted copper gloves on top as she just applied emla cream to her hands.   Wt Readings from Last 3 Encounters:  01/12/18 246 lb (111.6 kg)  12/03/17 248 lb (112.5 kg)  09/22/17 247 lb (112 kg)    Lab Results  Component Value Date   WBC 3.9 (L) 09/02/2015   HGB 13.1 09/02/2015  HCT 38.6 09/02/2015   PLT 184.0 09/02/2015   GLUCOSE 116 (H) 09/15/2017   CHOL 226 (H) 09/02/2015   TRIG 137.0 09/02/2015   HDL 44.20 09/02/2015   LDLDIRECT 139.1 02/11/2011   LDLCALC 155 (H) 09/02/2015   ALT 41 (H) 06/22/2016   AST 56 (H) 06/22/2016   NA 140 09/15/2017   K 3.8 09/15/2017   CL 104 09/15/2017   CREATININE 0.85 09/15/2017    CREATININE 0.87 09/15/2017   BUN 14 09/15/2017   BUN 14 09/15/2017   CO2 29 09/15/2017   TSH 0.73 09/13/2017   HGBA1C 6.4 06/22/2016    Assessment/Plan:  Essential hypertension -Controlled -Continue lisinopril 10 mg daily, hydrochlorothiazide 12.5 mg daily. -Lifestyle modifications encouraged  Neuropathy -Continue EMLA cream, Lyrica 100 mg, nortriptyline 75 mg nightly  Hot flashes due to menopause -Encourage to continue seeking treatment options with OB/GYN -Given handout -Encouraged to consider trying soy for relief.  History of renal calculi -Continue hydrochlorothiazide 12.5 mg daily -Continue drinking plenty of water and fluids -Continue follow-up with urology PRN -Given handout of foods that may help decrease symptoms.  Follow-up PRN in the next few months for CPE  Grier Mitts, MD

## 2018-01-12 NOTE — Patient Instructions (Signed)
Menopause and Herbal Products What is menopause? Menopause is the normal time of life when menstrual periods decrease in frequency and eventually stop completely. This process can take several years for some women. Menopause is complete when you have had an absence of menstruation for a full year since your last menstrual period. It usually occurs between the ages of 48 and 55. It is not common for menopause to begin before the age of 40. During menopause, your body stops producing the female hormones estrogen and progesterone. Common symptoms associated with this loss of hormones (vasomotor symptoms) are:  Hot flashes.  Hot flushes.  Night sweats.  Other common symptoms and complications of menopause include:  Decrease in sex drive.  Vaginal dryness and thinning of the walls of the vagina. This can make sex painful.  Dryness of the skin and development of wrinkles.  Headaches.  Tiredness.  Irritability.  Memory problems.  Weight gain.  Bladder infections.  Hair growth on the face and chest.  Inability to reproduce offspring (infertility).  Loss of density in the bones (osteoporosis) increasing your risk for breaks (fractures).  Depression.  Hardening and narrowing of the arteries (atherosclerosis). This increases your risk of heart attack and stroke.  What treatment options are available? There are many treatment choices for menopause symptoms. The most common treatment is hormone replacement therapy. Many alternative therapies for menopause are emerging, including the use of herbal products. These supplements can be found in the form of herbs, teas, oils, tinctures, and pills. Common herbal supplements for menopause are made from plants that contain phytoestrogens. Phytoestrogens are compounds that occur naturally in plants and plant products. They act like estrogen in the body. Foods and herbs that contain phytoestrogens include:  Soy.  Flax seeds.  Red  clover.  Ginseng.  What menopause symptoms may be helped if I use herbal products?  Vasomotor symptoms. These may be helped by: ? Soy. Some studies show that soy may have a moderate benefit for hot flashes. ? Black cohosh. There is limited evidence indicating this may be beneficial for hot flashes.  Symptoms that are related to heart and blood vessel disease. These may be helped by soy. Studies have shown that soy can help to lower cholesterol.  Depression. This may be helped by: ? St. John's wort. There is limited evidence that shows this may help mild to moderate depression. ? Black cohosh. There is evidence that this may help depression and mood swings.  Osteoporosis. Soy may help to decrease bone loss that is associated with menopause and may prevent osteoporosis. Limited evidence indicates that red clover may offer some bone loss protection as well. Other herbal products that are commonly used during menopause lack enough evidence to support their use as a replacement for conventional menopause therapies. These products include evening primrose, ginseng, and red clover. What are the cases when herbal products should not be used during menopause? Do not use herbal products during menopause without your health care provider's approval if:  You are taking medicine.  You have a preexisting liver condition.  Are there any risks in my taking herbal products during menopause? If you choose to use herbal products to help with symptoms of menopause, keep in mind that:  Different supplements have different and unmeasured amounts of herbal ingredients.  Herbal products are not regulated the same way that medicines are.  Concentrations of herbs may vary depending on the way they are prepared. For example, the concentration may be different in a pill,   tea, oil, and tincture.  Little is known about the risks of using herbal products, particularly the risks of long-term use.  Some herbal  supplements can be harmful when combined with certain medicines.  Most commonly reported side effects of herbal products are mild. However, if used improperly, many herbal supplements can cause serious problems. Talk to your health care provider before starting any herbal product. If problems develop, stop taking the supplement and let your health care provider know. This information is not intended to replace advice given to you by your health care provider. Make sure you discuss any questions you have with your health care provider. Document Released: 10/14/2007 Document Revised: 03/24/2016 Document Reviewed: 10/10/2013 Elsevier Interactive Patient Education  2017 Katherine [name].  Dietary Guidelines to Help Prevent Kidney Stones Kidney stones are deposits of minerals and salts that form inside your kidneys. Your risk of developing kidney stones may be greater depending on your diet, your lifestyle, the medicines you take, and whether you have certain medical conditions. Most people can reduce their chances of developing kidney stones by following the instructions below. Depending on your overall health and the type of kidney stones you tend to develop, your dietitian may give you more specific instructions. What are tips for following this plan? Reading food labels  Choose foods with "no salt added" or "low-salt" labels. Limit your sodium intake to less than 1500 mg per day.  Choose foods with calcium for each meal and snack. Try to eat about 300 mg of calcium at each meal. Foods that contain 200-500 mg of calcium per serving include: ? 8 oz (237 ml) of milk, fortified nondairy milk, and fortified fruit juice. ? 8 oz (237 ml) of kefir, yogurt, and soy yogurt. ? 4 oz (118 ml) of tofu. ? 1 oz of cheese. ? 1 cup (300 g) of dried figs. ? 1 cup (91 g) of cooked broccoli. ? 1-3 oz can of sardines or mackerel.  Most people need 1000 to 1500 mg of calcium each day. Talk to your dietitian about how  much calcium is recommended for you. Shopping  Buy plenty of fresh fruits and vegetables. Most people do not need to avoid fruits and vegetables, even if they contain nutrients that may contribute to kidney stones.  When shopping for convenience foods, choose: ? Whole pieces of fruit. ? Premade salads with dressing on the side. ? Low-fat fruit and yogurt smoothies.  Avoid buying frozen meals or prepared deli foods.  Look for foods with live cultures, such as yogurt and kefir. Cooking  Do not add salt to food when cooking. Place a salt shaker on the table and allow each person to add his or her own salt to taste.  Use vegetable protein, such as beans, textured vegetable protein (TVP), or tofu instead of meat in pasta, casseroles, and soups. Meal planning  Eat less salt, if told by your dietitian. To do this: ? Avoid eating processed or premade food. ? Avoid eating fast food.  Eat less animal protein, including cheese, meat, poultry, or fish, if told by your dietitian. To do this: ? Limit the number of times you have meat, poultry, fish, or cheese each week. Eat a diet free of meat at least 2 days a week. ? Eat only one serving each day of meat, poultry, fish, or seafood. ? When you prepare animal protein, cut pieces into small portion sizes. For most meat and fish, one serving is about the size of one deck of  cards.  Eat at least 5 servings of fresh fruits and vegetables each day. To do this: ? Keep fruits and vegetables on hand for snacks. ? Eat 1 piece of fruit or a handful of berries with breakfast. ? Have a salad and fruit at lunch. ? Have two kinds of vegetables at dinner.  Limit foods that are high in a substance called oxalate. These include: ? Spinach. ? Rhubarb. ? Beets. ? Potato chips and french fries. ? Nuts.  If you regularly take a diuretic medicine, make sure to eat at least 1-2 fruits or vegetables high in potassium each day. These  include: ? Avocado. ? Banana. ? Orange, prune, carrot, or tomato juice. ? Baked potato. ? Cabbage. ? Beans and split peas. General instructions  Drink enough fluid to keep your urine clear or pale yellow. This is the most important thing you can do.  Talk to your health care provider and dietitian about taking daily supplements. Depending on your health and the cause of your kidney stones, you may be advised: ? Not to take supplements with vitamin C. ? To take a calcium supplement. ? To take a daily probiotic supplement. ? To take other supplements such as magnesium, fish oil, or vitamin B6.  Take all medicines and supplements as told by your health care provider.  Limit alcohol intake to no more than 1 drink a day for nonpregnant women and 2 drinks a day for men. One drink equals 12 oz of beer, 5 oz of wine, or 1 oz of hard liquor.  Lose weight if told by your health care provider. Work with your dietitian to find strategies and an eating plan that works best for you. What foods are not recommended? Limit your intake of the following foods, or as told by your dietitian. Talk to your dietitian about specific foods you should avoid based on the type of kidney stones and your overall health. Grains Breads. Bagels. Rolls. Baked goods. Salted crackers. Cereal. Pasta. Vegetables Spinach. Rhubarb. Beets. Canned vegetables. Katherine [name]. Olives. Meats and other protein foods Nuts. Nut butters. Large portions of meat, poultry, or fish. Salted or cured meats. Deli meats. Hot dogs. Sausages. Dairy Cheese. Beverages Regular soft drinks. Regular vegetable juice. Seasonings and other foods Seasoning blends with salt. Salad dressings. Canned soups. Soy sauce. Ketchup. Barbecue sauce. Canned pasta sauce. Casseroles. Pizza. Lasagna. Frozen meals. Potato chips. [place] fries. Summary  You can reduce your risk of kidney stones by making changes to your diet.  The most important thing you can do is  drink enough fluid. You should drink enough fluid to keep your urine clear or pale yellow.  Ask your health care provider or dietitian how much protein from animal sources you should eat each day, and also how much salt and calcium you should have each day. This information is not intended to replace advice given to you by your health care provider. Make sure you discuss any questions you have with your health care provider. Document Released: 08/22/2010 Document Revised: 04/07/2016 Document Reviewed: 04/07/2016 Elsevier Interactive Patient Education  [employer].

## 2018-02-02 ENCOUNTER — Encounter (INDEPENDENT_AMBULATORY_CARE_PROVIDER_SITE_OTHER): Payer: Self-pay | Admitting: Orthopaedic Surgery

## 2018-02-02 ENCOUNTER — Ambulatory Visit (INDEPENDENT_AMBULATORY_CARE_PROVIDER_SITE_OTHER): Payer: Medicare Other

## 2018-02-02 ENCOUNTER — Ambulatory Visit (INDEPENDENT_AMBULATORY_CARE_PROVIDER_SITE_OTHER): Payer: Medicare Other | Admitting: Orthopaedic Surgery

## 2018-02-02 DIAGNOSIS — M25831 Other specified joint disorders, right wrist: Secondary | ICD-10-CM | POA: Diagnosis not present

## 2018-02-02 NOTE — Progress Notes (Signed)
Office Visit Note   Patient: Katherine [name]           Date of Birth: Jul 24, 1950           MRN: [id number] Visit Date: 02/02/2018              Requested by: Marletta Lor, MD [place], [place] [address] PCP: Billie Ruddy, MD   Assessment & Plan: Visit Diagnoses:  1. Ulnar impaction syndrome, right     Plan: At this point patient is healed her ulnar shortening osteotomy.  I think her pain is more consistent with arthritis.  She will use Voltaren gel as needed.  Overall she is happy with her recovery.  I will see her back as needed at this point.  Follow-Up Instructions: Return if symptoms worsen or fail to improve.   Orders:  Orders Placed This Encounter  Procedures  . XR Wrist Complete Right   No orders of the defined types were placed in this encounter.     Procedures: No procedures performed   Clinical Data: No additional findings.   Subjective: Chief Complaint  Patient presents with  . Right Wrist - Pain, Follow-up    Katherine [name] follows up today for her ulnar impaction syndrome.  She states that overall she is doing well.  She reports no scar tenderness.  She does endorse some vague right wrist pain is reminiscent of her arthritis pain in other joints.  Denies any recurrence of symptoms.   Review of Systems   Objective: Vital Signs: There were no vitals taken for this visit.  Physical Exam  Ortho Exam Right wrist exam shows a fully healed surgical scar.  She has returned back to baseline range of motion.  She has good hand function and strength. Specialty Comments:  No specialty comments available.  Imaging: Xr Wrist Complete Right  Result Date: 02/02/2018 Healed ulnar osteotomy with stable plate fixation.  Small cyst in the lunate.    PMFS History: Patient Active Problem List   Diagnosis Date Noted  . Body mass index 40.0-44.9, adult ([place]) 10/22/2017  . Closed displaced oblique fracture of shaft of ulna with  malunion, subsequent encounter 07/28/2017  . 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 Pain in right wrist 06/01/2017  . Trigger finger, right index finger 09/28/2016  . Impaired glucose tolerance 09/09/2015  . Hyperparathyroidism ([place]) 05/26/2013  . Hereditary and idiopathic peripheral neuropathy 12/29/2012  . Morbid obesity ([place])   . Menopausal symptoms 11/10/2011  . DIVERTICULOSIS, COLON 11/02/2007  . Dyslipidemia 03/09/2006  . Essential hypertension 03/09/2006  . NEPHROLITHIASIS, HX OF 03/09/2006   Past Medical History:  Diagnosis Date  . Dental crowns present   . History of kidney stones   . Hypertension    states under control with meds., has been on med. x 20 yr.  . Osteoarthritis   . Peripheral neuropathy    hands and feet  . Triangular fibrocartilage complex tear 06/2017   right  . 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 Wears partial dentures    upper and lower    Family History  Problem Relation Age of Onset  . Stroke Mother   . Cancer Mother 68       bone cancer  . Diabetes Sister     Past Surgical History:  Procedure Laterality Date  . ABDOMINAL HYSTERECTOMY  1991   partial  . CARPAL TUNNEL RELEASE Right 08/09/2002  . CARPAL TUNNEL  RELEASE Left 11/03/2006  . COLONOSCOPY WITH PROPOFOL  11/24/2011  . CYSTOSCOPY WITH RETROGRADE PYELOGRAM, URETEROSCOPY AND STENT PLACEMENT Left 12/18/2014   Procedure: CYSTOSCOPY WITH RETROGRADE PYELOGRAM, URETEROSCOPY AND STENT PLACEMENT left ureter;  Surgeon: Raynelle Bring, MD;  Location: [facility];  Service: Urology;  Laterality: Left;  . DILATION AND CURETTAGE OF UTERUS    . ELBOW ARTHROSCOPY Right   . EXTRACORPOREAL SHOCK WAVE LITHOTRIPSY Left 03/08/2017   Procedure: LEFT EXTRACORPOREAL SHOCK WAVE LITHOTRIPSY (ESWL);  Surgeon: Raynelle Bring, MD;  Location: [facility];  Service: Urology;  Laterality: Left;  . HOLMIUM LASER APPLICATION Left 05/18/8414   Procedure: HOLMIUM LASER  APPLICATION left ureter ;  Surgeon: Raynelle Bring, MD;  Location: [facility];  Service: Urology;  Laterality: Left;  . PARATHYROIDECTOMY Right 05/03/2015   Procedure: RIGHT SUPERIOR PARATHYROIDECTOMY;  Surgeon: Armandina Gemma, MD;  Location: [facility];  Service: General;  Laterality: Right;  . PATELLA REALIGNMENT Bilateral   . REPLACEMENT TOTAL KNEE BILATERAL    . ROTATOR CUFF REPAIR Right    x2  . SHOULDER ARTHROSCOPY Right    x 2  . SHOULDER ARTHROSCOPY W/ ROTATOR CUFF REPAIR Left 04/26/2002  . TONSILLECTOMY    . TOTAL KNEE ARTHROPLASTY Left 09/10/2003  . TOTAL KNEE ARTHROPLASTY Right 11/10/2004  . TRIGGER FINGER RELEASE Right 08/06/2008   ring finger  . TRIGGER FINGER RELEASE     multiple - right thumb, left index/long/ring/thumb  . ULNA OSTEOTOMY Right 07/28/2017   Procedure: REVISION RIGHT ULNAR SHORTENING OSTEOTOMY;  Surgeon: Leandrew Koyanagi, MD;  Location: [facility];  Service: Orthopedics;  Laterality: Right;  . WRIST ARTHROSCOPY Left 06/16/2000  . WRIST ARTHROSCOPY WITH ULNA SHORTENING Right 07/07/2017   Procedure: RIGHT WRIST ARTHROSCOPY WITH ULNAR SHORTENING OSTEOTOMY;  Surgeon: Leandrew Koyanagi, MD;  Location: [facility];  Service: Orthopedics;  Laterality: Right;   Social History   Occupational History    Employer: [employer]  Tobacco Use  . Smoking status: Never Smoker  . Smokeless tobacco: Never Used  Substance and Sexual Activity  . Alcohol use: No  . Drug use: No  . Sexual activity: Never

## 2018-02-09 ENCOUNTER — Encounter: Payer: Self-pay | Admitting: Family Medicine

## 2018-02-09 ENCOUNTER — Ambulatory Visit (INDEPENDENT_AMBULATORY_CARE_PROVIDER_SITE_OTHER): Payer: Medicare Other | Admitting: Family Medicine

## 2018-02-09 VITALS — BP 108/64 | HR 104 | Temp 98.1°F | Wt 243.0 lb

## 2018-02-09 DIAGNOSIS — M1990 Unspecified osteoarthritis, unspecified site: Secondary | ICD-10-CM | POA: Diagnosis not present

## 2018-02-09 DIAGNOSIS — G629 Polyneuropathy, unspecified: Secondary | ICD-10-CM | POA: Diagnosis not present

## 2018-02-09 DIAGNOSIS — Z23 Encounter for immunization: Secondary | ICD-10-CM

## 2018-02-09 DIAGNOSIS — Z0000 Encounter for general adult medical examination without abnormal findings: Secondary | ICD-10-CM

## 2018-02-09 DIAGNOSIS — R7303 Prediabetes: Secondary | ICD-10-CM | POA: Diagnosis not present

## 2018-02-09 DIAGNOSIS — I10 Essential (primary) hypertension: Secondary | ICD-10-CM

## 2018-02-09 DIAGNOSIS — E782 Mixed hyperlipidemia: Secondary | ICD-10-CM | POA: Diagnosis not present

## 2018-02-09 LAB — HEMOGLOBIN A1C: HEMOGLOBIN A1C: 6.2 % (ref 4.6–6.5)

## 2018-02-09 LAB — LIPID PANEL
Cholesterol: 181 mg/dL (ref 0–200)
HDL: 42 mg/dL (ref >39.00)
LDL Cholesterol: 99 mg/dL (ref 0–99)
NONHDL: 139.02
Total CHOL/HDL Ratio: 4
Triglycerides: 200 mg/dL — ABNORMAL HIGH (ref 0.0–149.0)
VLDL: 40 mg/dL (ref 0.0–40.0)

## 2018-02-09 NOTE — Patient Instructions (Signed)
Health Maintenance, Female Adopting a healthy lifestyle and getting preventive care can go a long way to promote health and wellness. Talk with your health care provider about what schedule of regular examinations is right for you. This is a good chance for you to check in with your provider about disease prevention and staying healthy. In between checkups, there are plenty of things you can do on your own. Experts have done a lot of research about which lifestyle changes and preventive measures are most likely to keep you healthy. Ask your health care provider for more information. Weight and diet Eat a healthy diet  Be sure to include plenty of vegetables, fruits, low-fat dairy products, and lean protein.  Do not eat a lot of foods high in solid fats, added sugars, or salt.  Get regular exercise. This is one of the most important things you can do for your health. ? Most adults should exercise for at least 150 minutes each week. The exercise should increase your heart rate and make you sweat (moderate-intensity exercise). ? Most adults should also do strengthening exercises at least twice a week. This is in addition to the moderate-intensity exercise.  Maintain a healthy weight  Body mass index (BMI) is a measurement that can be used to identify possible weight problems. It estimates body fat based on height and weight. Your health care provider can help determine your BMI and help you achieve or maintain a healthy weight.  For females 20 years of age and older: ? A BMI below 18.5 is considered underweight. ? A BMI of 18.5 to 24.9 is normal. ? A BMI of 25 to 29.9 is considered overweight. ? A BMI of 30 and above is considered obese.  Watch levels of cholesterol and blood lipids  You should start having your blood tested for lipids and cholesterol at 69 years of age, then have this test every 5 years.  You may need to have your cholesterol levels checked more often if: ? Your lipid or  cholesterol levels are high. ? You are older than 69 years of age. ? You are at high risk for heart disease.  Cancer screening Lung Cancer  Lung cancer screening is recommended for adults 55-80 years old who are at high risk for lung cancer because of a history of smoking.  A yearly low-dose CT scan of the lungs is recommended for people who: ? Currently smoke. ? Have quit within the past 15 years. ? Have at least a 30-pack-year history of smoking. A pack year is smoking an average of one pack of cigarettes a day for 1 year.  Yearly screening should continue until it has been 15 years since you quit.  Yearly screening should stop if you develop a health problem that would prevent you from having lung cancer treatment.  Breast Cancer  Practice breast self-awareness. This means understanding how your breasts normally appear and feel.  It also means doing regular breast self-exams. Let your health care provider know about any changes, no matter how small.  If you are in your 20s or 30s, you should have a clinical breast exam (CBE) by a health care provider every 1-3 years as part of a regular health exam.  If you are 40 or older, have a CBE every year. Also consider having a breast X-ray (mammogram) every year.  If you have a family history of breast cancer, talk to your health care provider about genetic screening.  If you are at high risk   for breast cancer, talk to your health care provider about having an MRI and a mammogram every year.  Breast cancer gene (BRCA) assessment is recommended for women who have family members with BRCA-related cancers. BRCA-related cancers include: ? Breast. ? Ovarian. ? Tubal. ? Peritoneal cancers.  Results of the assessment will determine the need for genetic counseling and BRCA1 and BRCA2 testing.  Cervical Cancer Your health care provider may recommend that you be screened regularly for cancer of the pelvic organs (ovaries, uterus, and  vagina). This screening involves a pelvic examination, including checking for microscopic changes to the surface of your cervix (Pap test). You may be encouraged to have this screening done every 3 years, beginning at age 22.  For women ages 56-65, health care providers may recommend pelvic exams and Pap testing every 3 years, or they may recommend the Pap and pelvic exam, combined with testing for human papilloma virus (HPV), every 5 years. Some types of HPV increase your risk of cervical cancer. Testing for HPV may also be done on women of any age with unclear Pap test results.  Other health care providers may not recommend any screening for nonpregnant women who are considered low risk for pelvic cancer and who do not have symptoms. Ask your health care provider if a screening pelvic exam is right for you.  If you have had past treatment for cervical cancer or a condition that could lead to cancer, you need Pap tests and screening for cancer for at least 20 years after your treatment. If Pap tests have been discontinued, your risk factors (such as having a new sexual partner) need to be reassessed to determine if screening should resume. Some women have medical problems that increase the chance of getting cervical cancer. In these cases, your health care provider may recommend more frequent screening and Pap tests.  Colorectal Cancer  This type of cancer can be detected and often prevented.  Routine colorectal cancer screening usually begins at 69 years of age and continues through 69 years of age.  Your health care provider may recommend screening at an earlier age if you have risk factors for colon cancer.  Your health care provider may also recommend using home test kits to check for hidden blood in the stool.  A small camera at the end of a tube can be used to examine your colon directly (sigmoidoscopy or colonoscopy). This is done to check for the earliest forms of colorectal  cancer.  Routine screening usually begins at age 33.  Direct examination of the colon should be repeated every 5-10 years through 69 years of age. However, you may need to be screened more often if early forms of precancerous polyps or small growths are found.  Skin Cancer  Check your skin from head to toe regularly.  Tell your health care provider about any new moles or changes in moles, especially if there is a change in a mole's shape or color.  Also tell your health care provider if you have a mole that is larger than the size of a pencil eraser.  Always use sunscreen. Apply sunscreen liberally and repeatedly throughout the day.  Protect yourself by wearing long sleeves, pants, a wide-brimmed hat, and sunglasses whenever you are outside.  Heart disease, diabetes, and high blood pressure  High blood pressure causes heart disease and increases the risk of stroke. High blood pressure is more likely to develop in: ? People who have blood pressure in the high end of  the normal range (130-139/85-89 mm Hg). ? People who are overweight or obese. ? People who are African American.  If you are 21-29 years of age, have your blood pressure checked every 3-5 years. If you are 3 years of age or older, have your blood pressure checked every year. You should have your blood pressure measured twice-once when you are at a hospital or clinic, and once when you are not at a hospital or clinic. Record the average of the two measurements. To check your blood pressure when you are not at a hospital or clinic, you can use: ? An automated blood pressure machine at a pharmacy. ? A home blood pressure monitor.  If you are between 17 years and 37 years old, ask your health care provider if you should take aspirin to prevent strokes.  Have regular diabetes screenings. This involves taking a blood sample to check your fasting blood sugar level. ? If you are at a normal weight and have a low risk for diabetes,  have this test once every three years after 69 years of age. ? If you are overweight and have a high risk for diabetes, consider being tested at a younger age or more often. Preventing infection Hepatitis B  If you have a higher risk for hepatitis B, you should be screened for this virus. You are considered at high risk for hepatitis B if: ? You were born in a country where hepatitis B is common. Ask your health care provider which countries are considered high risk. ? Your parents were born in a high-risk country, and you have not been immunized against hepatitis B (hepatitis B vaccine). ? You have HIV or AIDS. ? You use needles to inject street drugs. ? You live with someone who has hepatitis B. ? You have had sex with someone who has hepatitis B. ? You get hemodialysis treatment. ? You take certain medicines for conditions, including cancer, organ transplantation, and autoimmune conditions.  Hepatitis C  Blood testing is recommended for: ? Everyone born from [birth year] through 1967. ? Anyone with known risk factors for hepatitis C.  Sexually transmitted infections (STIs)  You should be screened for sexually transmitted infections (STIs) including gonorrhea and chlamydia if: ? You are sexually active and are younger than 69 years of age. ? You are older than 69 years of age and your health care provider tells you that you are at risk for this type of infection. ? Your sexual activity has changed since you were last screened and you are at an increased risk for chlamydia or gonorrhea. Ask your health care provider if you are at risk.  If you do not have HIV, but are at risk, it may be recommended that you take a prescription medicine daily to prevent HIV infection. This is called pre-exposure prophylaxis (PrEP). You are considered at risk if: ? You are sexually active and do not regularly use condoms or know the HIV status of your partner(s). ? You take drugs by injection. ? You are  sexually active with a partner who has HIV.  Talk with your health care provider about whether you are at high risk of being infected with HIV. If you choose to begin PrEP, you should first be tested for HIV. You should then be tested every 3 months for as long as you are taking PrEP. Pregnancy  If you are premenopausal and you may become pregnant, ask your health care provider about preconception counseling.  If you may become  pregnant, take 400 to 800 micrograms (mcg) of folic acid every day.  If you want to prevent pregnancy, talk to your health care provider about birth control (contraception). Osteoporosis and menopause  Osteoporosis is a disease in which the bones lose minerals and strength with aging. This can result in serious bone fractures. Your risk for osteoporosis can be identified using a bone density scan.  If you are 59 years of age or older, or if you are at risk for osteoporosis and fractures, ask your health care provider if you should be screened.  Ask your health care provider whether you should take a calcium or vitamin D supplement to lower your risk for osteoporosis.  Menopause may have certain physical symptoms and risks.  Hormone replacement therapy may reduce some of these symptoms and risks. Talk to your health care provider about whether hormone replacement therapy is right for you. Follow these instructions at home:  Schedule regular health, dental, and eye exams.  Stay current with your immunizations.  Do not use any tobacco products including cigarettes, chewing tobacco, or electronic cigarettes.  If you are pregnant, do not drink alcohol.  If you are breastfeeding, limit how much and how often you drink alcohol.  Limit alcohol intake to no more than 1 drink per day for nonpregnant women. One drink equals 12 ounces of beer, 5 ounces of wine, or 1 ounces of hard liquor.  Do not use street drugs.  Do not share needles.  Ask your health care  provider for help if you need support or information about quitting drugs.  Tell your health care provider if you often feel depressed.  Tell your health care provider if you have ever been abused or do not feel safe at home. This information is not intended to replace advice given to you by your health care provider. Make sure you discuss any questions you have with your health care provider. Document Released: 11/10/2010 Document Revised: 10/03/2015 Document Reviewed: 01/29/2015 Elsevier Interactive Patient Education  2018 [place].  High Cholesterol High cholesterol is a condition in which the blood has high levels of a white, waxy, fat-like substance (cholesterol). The human body needs small amounts of cholesterol. The liver makes all the cholesterol that the body needs. Extra (excess) cholesterol comes from the food that we eat. Cholesterol is carried from the liver by the blood through the blood vessels. If you have high cholesterol, deposits (plaques) may build up on the walls of your blood vessels (arteries). Plaques make the arteries narrower and stiffer. Cholesterol plaques increase your risk for heart attack and stroke. Work with your health care provider to keep your cholesterol levels in a healthy range. What increases the risk? This condition is more likely to develop in people who:  Eat foods that are high in animal fat (saturated fat) or cholesterol.  Are overweight.  Are not getting enough exercise.  Have a family history of high cholesterol.  What are the signs or symptoms? There are no symptoms of this condition. How is this diagnosed? This condition may be diagnosed from the results of a blood test.  If you are older than age 35, your health care provider may check your cholesterol every 4-6 years.  You may be checked more often if you already have high cholesterol or other risk factors for heart disease.  The blood test for cholesterol measures:  "Bad"  cholesterol (LDL cholesterol). This is the main type of cholesterol that causes heart disease. The desired level  for LDL is less than 100.  "Good" cholesterol (HDL cholesterol). This type helps to protect against heart disease by cleaning the arteries and carrying the LDL away. The desired level for HDL is 60 or higher.  Triglycerides. These are fats that the body can store or burn for energy. The desired number for triglycerides is lower than 150.  Total cholesterol. This is a measure of the total amount of cholesterol in your blood, including LDL cholesterol, HDL cholesterol, and triglycerides. A healthy number is less than 200.  How is this treated? This condition is treated with diet changes, lifestyle changes, and medicines. Diet changes  This may include eating more whole grains, fruits, vegetables, nuts, and fish.  This may also include cutting back on red meat and foods that have a lot of added sugar. Lifestyle changes  Changes may include getting at least 40 minutes of aerobic exercise 3 times a week. Aerobic exercises include walking, biking, and swimming. Aerobic exercise along with a healthy diet can help you maintain a healthy weight.  Changes may also include quitting smoking. Medicines  Medicines are usually given if diet and lifestyle changes have failed to reduce your cholesterol to healthy levels.  Your health care provider may prescribe a statin medicine. Statin medicines have been shown to reduce cholesterol, which can reduce the risk of heart disease. Follow these instructions at home: Eating and drinking  If told by your health care provider:  Eat chicken (without skin), fish, veal, shellfish, ground [place] breast, and round or loin cuts of red meat.  Do not eat fried foods or fatty meats, such as hot dogs and salami.  Eat plenty of fruits, such as apples.  Eat plenty of vegetables, such as broccoli, potatoes, and carrots.  Eat beans, peas, and  lentils.  Eat grains such as barley, rice, couscous, and bulgur wheat.  Eat pasta without cream sauces.  Use skim or nonfat milk, and eat low-fat or nonfat yogurt and cheeses.  Do not eat or drink whole milk, cream, ice cream, egg yolks, or hard cheeses.  Do not eat stick margarine or tub margarines that contain trans fats (also called partially hydrogenated oils).  Do not eat saturated tropical oils, such as coconut oil and palm oil.  Do not eat cakes, cookies, crackers, or other baked goods that contain trans fats.  General instructions  Exercise as directed by your health care provider. Increase your activity level with activities such as gardening, walking, and taking the stairs.  Take over-the-counter and prescription medicines only as told by your health care provider.  Do not use any products that contain nicotine or tobacco, such as cigarettes and e-cigarettes. If you need help quitting, ask your health care provider.  Keep all follow-up visits as told by your health care provider. This is important. Contact a health care provider if:  You are struggling to maintain a healthy diet or weight.  You need help to start on an exercise program.  You need help to stop smoking. Get help right away if:  You have chest pain.  You have trouble breathing. This information is not intended to replace advice given to you by your health care provider. Make sure you discuss any questions you have with your health care provider. Document Released: 04/27/2005 Document Revised: 11/23/2015 Document Reviewed: 10/26/2015 Elsevier Interactive Patient Education  2018 [employer].  Prediabetes Prediabetes is the condition of having a blood sugar (blood glucose) level that is higher than it should be, but  not high enough for you to be diagnosed with type 2 diabetes. Having prediabetes puts you at risk for developing type 2 diabetes (type 2 diabetes mellitus). Prediabetes may be called  impaired glucose tolerance or impaired fasting glucose. Prediabetes usually does not cause symptoms. Your health care provider can diagnose this condition with blood tests. You may be tested for prediabetes if you are overweight and if you have at least one other risk factor for prediabetes. Risk factors for prediabetes include:  Having a family member with type 2 diabetes.  Being overweight or obese.  Being older than age 6.  Being of American-Indian, African-American, Hispanic/Latino, or Asian/Pacific Islander descent.  Having an inactive (sedentary) lifestyle.  Having a history of gestational diabetes or polycystic ovarian syndrome (PCOS).  Having low levels of good cholesterol (HDL-C) or high levels of blood fats (triglycerides).  Having high blood pressure.  What is blood glucose and how is blood glucose measured?  Blood glucose refers to the amount of glucose in your bloodstream. Glucose comes from eating foods that contain sugars and starches (carbohydrates) that the body breaks down into glucose. Your blood glucose level may be measured in mg/dL (milligrams per deciliter) or mmol/L (millimoles per liter).Your blood glucose may be checked with one or more of the following blood tests:  A fasting blood glucose (FBG) test. You will not be allowed to eat (you will fast) for at least 8 hours before a blood sample is taken. ? A normal range for FBG is 70-100 mg/dl (3.9-5.6 mmol/L).  An A1c (hemoglobin A1c) blood test. This test provides information about blood glucose control over the previous 2?68month.  An oral glucose tolerance test (OGTT). This test measures your blood glucose twice: ? After fasting. This is your baseline level. ? Two hours after you drink a beverage that contains glucose.  You may be diagnosed with prediabetes:  If your FBG is 100?125 mg/dL (5.6-6.9 mmol/L).  If your A1c level is 5.7?6.4%.  If your OGGT result is 140?199 mg/dL (7.8-11  mmol/L).  These blood tests may be repeated to confirm your diagnosis. What happens if blood glucose is too high? The pancreas produces a hormone (insulin) that helps move glucose from the bloodstream into cells. When cells in the body do not respond properly to insulin that the body makes (insulin resistance), excess glucose builds up in the blood instead of going into cells. As a result, high blood glucose (hyperglycemia) can develop, which can cause many complications. This is a symptom of prediabetes. What can happen if blood glucose stays higher than normal for a long time? Having high blood glucose for a long time is dangerous. Too much glucose in your blood can damage your nerves and blood vessels. Long-term damage can lead to complications from diabetes, which may include:  Heart disease.  Stroke.  Blindness.  Kidney disease.  Depression.  Poor circulation in the feet and legs, which could lead to surgical removal (amputation) in severe cases.  How can prediabetes be prevented from turning into type 2 diabetes?  To help prevent type 2 diabetes, take the following actions:  Be physically active. ? Do moderate-intensity physical activity for at least 30 minutes on at least 5 days of the week, or as much as told by your health care provider. This could be brisk walking, biking, or water aerobics. ? Ask your health care provider what activities are safe for you. A mix of physical activities may be best, such as walking, swimming, cycling, and strength  training.  Lose weight as told by your health care provider. ? Losing 5-7% of your body weight can reverse insulin resistance. ? Your health care provider can determine how much weight loss is best for you and can help you lose weight safely.  Follow a healthy meal plan. This includes eating lean proteins, complex carbohydrates, fresh fruits and vegetables, low-fat dairy products, and healthy fats. ? Follow instructions from your  health care provider about eating or drinking restrictions. ? Make an appointment to see a diet and nutrition specialist (registered dietitian) to help you create a healthy eating plan that is right for you.  Do not smoke or use any tobacco products, such as cigarettes, chewing tobacco, and e-cigarettes. If you need help quitting, ask your health care provider.  Take over-the-counter and prescription medicines as told by your health care provider. You may be prescribed medicines that help lower the risk of type 2 diabetes.  This information is not intended to replace advice given to you by your health care provider. Make sure you discuss any questions you have with your health care provider. Document Released: 08/19/2015 Document Revised: 10/03/2015 Document Reviewed: 06/18/2015 Elsevier Interactive Patient Education  [employer].

## 2018-02-09 NOTE — Progress Notes (Signed)
Subjective:   Katherine [name] is a 67 y.o. female who presents for Medicare Annual (Subsequent) preventive examination.  Patient endorses continued issues with neuropathy.  Taking Lyrica 100 mg, lidocaine, nortriptyline.  Patient wearing copper gloves to help with pain.  Patient going to water aerobics 3 times per week to help with her arthritis pain.  HTN: Taking lisinopril 10 mg daily without issue.  Limiting sodium intake.  Review of Systems:  General: Denies fever, chills, night sweats, changes in weight, changes in appetite HEENT: Denies headaches, ear pain, changes in vision, rhinorrhea, sore throat CV: Denies CP, palpitations, SOB, orthopnea Pulm: Denies SOB, cough, wheezing GI: Denies abdominal pain, nausea, vomiting, diarrhea, constipation GU: Denies dysuria, hematuria, frequency, vaginal discharge Msk:  + muscle cramps, joint pains  Neuro: Denies weakness  + numbness, tingling Skin: Denies rashes, bruising Psych: Denies depression, anxiety, hallucinations      Objective:     Vitals: BP 108/64 (BP Location: Left Arm, Patient Position: Sitting, Cuff Size: Large)   Pulse (!) 104   Temp 98.1 F (36.7 C) (Oral)   Wt 243 lb (110.2 kg)   SpO2 98%   BMI 41.71 kg/m   Body mass index is 41.71 kg/m.  Advanced Directives 07/28/2017 07/23/2017 07/02/2017 03/08/2017 05/01/2015 01/09/2015 12/10/2014  Does Patient Have a Medical Advance Directive? No No No No No No No  Would patient like information on creating a medical advance directive? No - Patient declined - No - Patient declined No - Patient declined - No - patient declined information No - patient declined information    Tobacco Social History   Tobacco Use  Smoking Status Never Smoker  Smokeless Tobacco Never Used     Counseling given: Not Answered   Past Medical History:  Diagnosis Date  . Dental crowns present   . History of kidney stones   . Hypertension    states under control with meds., has been on med. x  20 yr.  . Osteoarthritis   . Peripheral neuropathy    hands and feet  . Triangular fibrocartilage complex tear 06/2017   right  . 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 Wears partial dentures    upper and lower   Past Surgical History:  Procedure Laterality Date  . ABDOMINAL HYSTERECTOMY  1991   partial  . CARPAL TUNNEL RELEASE Right 08/09/2002  . CARPAL TUNNEL RELEASE Left 11/03/2006  . COLONOSCOPY WITH PROPOFOL  11/24/2011  . CYSTOSCOPY WITH RETROGRADE PYELOGRAM, URETEROSCOPY AND STENT PLACEMENT Left 12/18/2014   Procedure: CYSTOSCOPY WITH RETROGRADE PYELOGRAM, URETEROSCOPY AND STENT PLACEMENT left ureter;  Surgeon: Raynelle Bring, MD;  Location: [facility];  Service: Urology;  Laterality: Left;  . DILATION AND CURETTAGE OF UTERUS    . ELBOW ARTHROSCOPY Right   . EXTRACORPOREAL SHOCK WAVE LITHOTRIPSY Left 03/08/2017   Procedure: LEFT EXTRACORPOREAL SHOCK WAVE LITHOTRIPSY (ESWL);  Surgeon: Raynelle Bring, MD;  Location: [facility];  Service: Urology;  Laterality: Left;  . HOLMIUM LASER APPLICATION Left 06/15/9561   Procedure: HOLMIUM LASER APPLICATION left ureter ;  Surgeon: Raynelle Bring, MD;  Location[facility];  Service: Urology;  Laterality: Left;  . PARATHYROIDECTOMY Right 05/03/2015   Procedure: RIGHT SUPERIOR PARATHYROIDECTOMY;  Surgeon: Armandina Gemma, MD;  Location: [facility];  Service: General;  Laterality: Right;  . PATELLA REALIGNMENT Bilateral   . REPLACEMENT TOTAL KNEE BILATERAL    . ROTATOR CUFF REPAIR Right    x2  . SHOULDER ARTHROSCOPY Right    x 2  . SHOULDER ARTHROSCOPY  W/ ROTATOR CUFF REPAIR Left 04/26/2002  . TONSILLECTOMY    . TOTAL KNEE ARTHROPLASTY Left 09/10/2003  . TOTAL KNEE ARTHROPLASTY Right 11/10/2004  . TRIGGER FINGER RELEASE Right 08/06/2008   ring finger  . TRIGGER FINGER RELEASE     multiple - right thumb, left index/long/ring/thumb  . ULNA OSTEOTOMY Right 07/28/2017   Procedure: REVISION RIGHT ULNAR SHORTENING OSTEOTOMY;  Surgeon: Leandrew Koyanagi, MD;   Location: [facility];  Service: Orthopedics;  Laterality: Right;  . WRIST ARTHROSCOPY Left 06/16/2000  . WRIST ARTHROSCOPY WITH ULNA SHORTENING Right 07/07/2017   Procedure: RIGHT WRIST ARTHROSCOPY WITH ULNAR SHORTENING OSTEOTOMY;  Surgeon: Leandrew Koyanagi, MD;  Location: [facility];  Service: Orthopedics;  Laterality: Right;   Family History  Problem Relation Age of Onset  . Stroke Mother   . Cancer Mother 77       bone cancer  . Diabetes Sister    Social History   Socioeconomic History  . Marital status: Single    Spouse name: Not on file  . Number of children: 1  . Years of education: Not on file  . Highest education level: Not on file  Occupational History    Employer: [employer]  Social Needs  . Financial resource strain: Not on file  . Food insecurity:    Worry: Not on file    Inability: Not on file  . Transportation needs:    Medical: Not on file    Non-medical: Not on file  Tobacco Use  . Smoking status: Never Smoker  . Smokeless tobacco: Never Used  Substance and Sexual Activity  . Alcohol use: No  . Drug use: No  . Sexual activity: Never  Lifestyle  . Physical activity:    Days per week: Not on file    Minutes per session: Not on file  . Stress: Not on file  Relationships  . Social connections:    Talks on phone: Not on file    Gets together: Not on file    Attends religious service: Not on file    Active member of club or organization: Not on file    Attends meetings of clubs or organizations: Not on file    Relationship status: Not on file  Other Topics Concern  . Not on file  Social History Narrative   Patient is single, has 1 child   Patient is right handed   Caffeine consumption is 0    Outpatient Encounter Medications as of 02/09/2018  Medication Sig  . b complex vitamins tablet Take 1 tablet by mouth daily.  . calcium-vitamin D (OSCAL WITH D) 500-200 MG-UNIT tablet Take 1 tablet by mouth 3 (three) times daily.    . cholecalciferol (VITAMIN D) 1000 units tablet Take 2,000 Units by mouth daily.  . hydrochlorothiazide (HYDRODIURIL) 12.5 MG tablet Take 12.5 mg by mouth daily.  Marland Kitchen L-Methylfolate-Algae-B12-B6 (METANX) 3-90.314-2-35 MG CAPS Take by mouth 2 (two) times daily.  Marland Kitchen lidocaine-prilocaine (EMLA) cream APPLY FOUR TIMES PER DAY AS NEEDED.  Marland Kitchen lisinopril (PRINIVIL,ZESTRIL) 10 MG tablet TAKE 1 TABLET BY MOUTH  DAILY  . Multiple Vitamin (MULTIVITAMIN) tablet Take 1 tablet by mouth daily.  . nortriptyline (PAMELOR) 25 MG capsule TAKE 3 CAPSULES BY MOUTH AT BEDTIME  . pregabalin (LYRICA) 100 MG capsule Take 1 capsule (100 mg total) by mouth 2 (two) times daily.  . traMADol (ULTRAM) 50 MG tablet Take one to two tablets by mouth every 6 hours if needed for pain, Prescribed  by Neurology  . [DISCONTINUED] gabapentin (NEURONTIN) 300 MG capsule Take 300 mg by mouth 3 (three) times daily.     No facility-administered encounter medications on file as of 02/09/2018.     Activities of Daily Living In your present state of health, do you have any difficulty performing the following activities: 07/28/2017 03/08/2017  Hearing? N N  Vision? N N  Difficulty concentrating or making decisions? N N  Walking or climbing stairs? N N  Dressing or bathing? N N  Some recent data might be hidden    Patient Care Team: Billie Ruddy, MD as PCP - General (Family Medicine)    Assessment:   This is a routine wellness examination for [place].  Exercise Activities and Dietary recommendations    Continue your current routine of water aerobics 3x/wk.  Goals   None     Fall Risk Fall Risk  09/09/2015 05/29/2014  Falls in the past year? Yes No  Number falls in past yr: 1 -  Injury with Fall? Yes -  Risk for fall due to : Other (Comment) -  Risk for fall due to: Comment Pt was lightheaded and tripped -   Is the patient's home free of loose throw rugs in walkways, pet beds, electrical cords, etc?   yes      Grab bars in  the bathroom? no      Handrails on the stairs?   yes      Adequate lighting?   yes  Depression Screen PHQ 2/9 Scores 09/09/2015 05/29/2014  PHQ - 2 Score 0 0     Cognitive Function  No issues reported   Immunization History  Administered Date(s) Administered  . Influenza Split 02/18/2011, 05/26/2013  . Influenza, High Dose Seasonal PF 03/11/2016, 03/24/2017, 02/09/2018  . Influenza,inj,Quad PF,6+ Mos 05/29/2014, 03/12/2015, 05/28/2015  . Pneumococcal Conjugate-13 05/31/2014  . Pneumococcal Polysaccharide-23 06/22/2016  . Td 01/09/2006  . Zoster 02/18/2011    Screening Tests Health Maintenance  Topic Date Due  . Hepatitis C Screening  February 06, 1951  . TETANUS/TDAP  01/10/2016  . MAMMOGRAM  10/21/2018  . COLONOSCOPY  01/15/2025  . INFLUENZA VACCINE  Completed  . DEXA SCAN  Completed  . PNA vac Low Risk Adult  Completed    Cancer Screenings: Lung: Low Dose CT Chest recommended if Age 42-80 years, 30 pack-year currently smoking OR have quit w/in 15years. Patient does not qualify. Breast:  Up to date on Mammogram? Yes  10/28/2017 Up to date of Bone Density/Dexa? Yes Colorectal: colonoscopy 01/16/2015  Additional Screenings: Hepatitis C Screening: declined at this time     Plan:      I have personally reviewed and noted the following in the patient's chart:   . Medical and social history . Use of alcohol, tobacco or illicit drugs  . Current medications and supplements . Functional ability and status . Nutritional status . Physical activity . Advanced directives . List of other physicians . Hospitalizations, surgeries, and ER visits in previous 12 months . Vitals . Screenings to include cognitive, depression, and falls . Referrals and appointments  In addition, I have reviewed and discussed with patient certain preventive protocols, quality metrics, and best practice recommendations. A written personalized care plan for preventive services as well as general preventive  health recommendations were provided to patient.    Billie Ruddy, MD  02/09/2018

## 2018-02-16 ENCOUNTER — Telehealth: Payer: Self-pay | Admitting: Family Medicine

## 2018-02-16 NOTE — Telephone Encounter (Signed)
  Pharmacy is calling for a refill on L-Methylfolate-Algae-B12-B6 (METANX) 3-90.314-2-35 MG CAPS 90 day supply  [facility] Fax number [phone]  By historic provider

## 2018-02-16 NOTE — Telephone Encounter (Signed)
Copied from [place] [phone]. Topic: Quick Communication - See Telephone Encounter >> Feb 16, 2018  4:39 PM Vernona Rieger wrote: CRM for notification. See Telephone encounter for: 02/16/18.  Pharmacy is calling for a refill on L-Methylfolate-Algae-B12-B6 (METANX) 3-90.314-2-35 MG CAPS 90 day supply  [facility] Fax number [phone]

## 2018-02-16 NOTE — Telephone Encounter (Signed)
I didn't prescribe this medicine.  I'm not even sure what it is.  Thank you.

## 2018-02-23 ENCOUNTER — Telehealth: Payer: Self-pay | Admitting: *Deleted

## 2018-02-23 NOTE — Telephone Encounter (Signed)
Okay to refill? 

## 2018-02-23 NOTE — Telephone Encounter (Signed)
Pt states this rx was being prescribed by Dr Raliegh Ip and now needs to be sent in by Dr Volanda Napoleon.  They need a new written rx for this med.    Please fax to: [facility] Fax:  [phone]

## 2018-02-23 NOTE — Telephone Encounter (Signed)
Not our providers

## 2018-02-23 NOTE — Telephone Encounter (Signed)
Re-routing to [facility]

## 2018-02-23 NOTE — Telephone Encounter (Signed)
Please Advice if ok

## 2018-02-23 NOTE — Telephone Encounter (Signed)
Copied from [place] [phone]. Topic: General - Other >> Feb 22, 2018  4:52 PM Marin Olp L wrote: Reason for CRM: Wants to know if she can drop off her pre-op appt for Dr. Volanda Napoleon w/o an appt? Having knee surgery. Please advise.

## 2018-02-24 ENCOUNTER — Other Ambulatory Visit: Payer: Self-pay

## 2018-02-24 MED ORDER — METANX 3-90.314-2-35 MG PO CAPS: ORAL_CAPSULE | ORAL | 0 refills | Status: DC

## 2018-02-24 NOTE — Telephone Encounter (Signed)
Rx sent to pt requested pharmacy. 

## 2018-02-24 NOTE — Telephone Encounter (Signed)
Ok to refill med

## 2018-02-24 NOTE — Telephone Encounter (Signed)
Pharm is calling and checking on refill request. Pt saw dr banks on 02/09/18

## 2018-02-24 NOTE — Telephone Encounter (Signed)
That's fine as she was just seen 02/09/18.  If additional testing needed she may have to come in to clinic.

## 2018-02-25 ENCOUNTER — Other Ambulatory Visit: Payer: Self-pay

## 2018-02-25 MED ORDER — METANX 3-90.314-2-35 MG PO CAPS: ORAL_CAPSULE | ORAL | 0 refills | Status: DC

## 2018-02-25 NOTE — Telephone Encounter (Signed)
John, with [facility] Pharmacist states the pt wanted to see if she can get the L-Methylfolate-Algae-B12-B6 [facility]) 3-90.314-2-35 MG CAPS In a 90 day rx. Please advise.

## 2018-02-28 NOTE — Telephone Encounter (Signed)
Spoke with pt states that she has an appointment on 03/01/2018

## 2018-02-28 NOTE — Telephone Encounter (Signed)
Rx refills sent to pt pharmacy

## 2018-03-01 ENCOUNTER — Encounter: Payer: Self-pay | Admitting: Family Medicine

## 2018-03-01 ENCOUNTER — Ambulatory Visit (INDEPENDENT_AMBULATORY_CARE_PROVIDER_SITE_OTHER): Payer: Medicare Other | Admitting: Family Medicine

## 2018-03-01 VITALS — BP 98/68 | HR 112 | Temp 98.3°F | Wt 238.0 lb

## 2018-03-01 DIAGNOSIS — Z23 Encounter for immunization: Secondary | ICD-10-CM

## 2018-03-01 DIAGNOSIS — Z029 Encounter for administrative examinations, unspecified: Secondary | ICD-10-CM

## 2018-03-01 DIAGNOSIS — M25561 Pain in right knee: Secondary | ICD-10-CM | POA: Diagnosis not present

## 2018-03-01 DIAGNOSIS — I10 Essential (primary) hypertension: Secondary | ICD-10-CM | POA: Diagnosis not present

## 2018-03-01 NOTE — Progress Notes (Signed)
No chief complaint on file.   HPI:  Patient is seen for optimization of general medical care prior to surgery. Surgery type: Revision of right total knee.  Per pt replacing a piece of plastic.  Form from Ortho office did not specify procedure type. Date of surgery: TBD  Kidney disease? No Prior issues following anesthesia? No  Hx MI, heart arrythmia, CHF, angina or stroke? none Epilepsy or Seizures? none Arthritis or problems with neck or jaw? none Thyroid disease? none Liver disease? none Asthma, COPD or chronic lung disease? none Diabetes? none (Needs to be evaluated by anesthesia if yes to these questions.)  Other: Poor nutrition, Frail or other: no  METS:  ?Can take care of self, such as eat, dress, or use the toilet (1 MET). yes ?Can walk up a flight of steps or a hill (4 METs).yes ?Can do heavy work around the house such as scrubbing floors or lifting or moving heavy furniture (between 4 and 10 METs). no ?Can participate in strenuous sports such as swimming, singles tennis, football, basketball, and skiing (>10 METs) no . AHA Risks: Major predictors that require intensive management and may lead to delay in or cancellation of the operative procedure unless emergent: NONE  . Unstable coronary syndromes including unstable or severe angina or recent MI  . Decompensated heart failure including NYHA functional class IV or worsening or new-onset HF  . Significant arrhythmias including high grade AV block, symptomatic ventricular arrhythmias, supraventricular arrhythmias with ventricular rate >100 bpm at rest, symptomatic bradycardia, and newly recognized ventricular tachycardia  . Severe heart valve disease including severe aortic stenosis or symptomatic mitral stenosis   Other clinical predictors that warrant careful assessment of current status: NONE  . History of ischemic heart disease . History of cerebrovascular disease  . History of compensated heart failure or prior heart  failure  . Diabetes mellitus  . Renal insufficiency  Type of surgery and Risk: 1) High risk (reported risk of cardiac death or nonfatal myocardial infarction [MI] often greater than 5 percent):  Marland Kitchen Aortic and other major vascular surgery  . Peripheral artery surgery   2)Intermediate risk (reported risk of cardiac death or nonfatal MI generally 1 to 5 percent):  Marland Kitchen Carotid endarterectomy  . Head and neck surgery  . Intraperitoneal and intrathoracic surgery  . Orthopedic surgery  . Prostate surgery   3)Low risk (reported risk of cardiac death or nonfatal MI generally less than 1 percent):  Marland Kitchen Ambulatory surgery  . Endoscopic procedures  . Superficial procedure  . Cataract surgery  . Breast surgery  ROS: See pertinent positives and negatives per HPI. 11 point ROS negative except where noted.  Past Medical History:  Diagnosis Date  . Dental crowns present   . History of kidney stones   . Hypertension    states under control with meds., has been on med. x 20 yr.  . Osteoarthritis   . Peripheral neuropathy    hands and feet  . Triangular fibrocartilage complex tear 06/2017   right  . 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 Wears partial dentures    upper and lower    Past Surgical History:  Procedure Laterality Date  . ABDOMINAL HYSTERECTOMY  1991   partial  . CARPAL TUNNEL RELEASE Right 08/09/2002  . CARPAL TUNNEL RELEASE Left 11/03/2006  . COLONOSCOPY WITH PROPOFOL  11/24/2011  . CYSTOSCOPY WITH RETROGRADE PYELOGRAM, URETEROSCOPY AND STENT PLACEMENT Left 12/18/2014   Procedure: CYSTOSCOPY WITH RETROGRADE PYELOGRAM, URETEROSCOPY AND STENT PLACEMENT left  ureter;  Surgeon: Raynelle Bring, MD;  [facility];  Service: Urology;  Laterality: Left;  . DILATION AND CURETTAGE OF UTERUS    . ELBOW ARTHROSCOPY Right   . EXTRACORPOREAL SHOCK WAVE LITHOTRIPSY Left 03/08/2017   Procedure: LEFT EXTRACORPOREAL SHOCK WAVE LITHOTRIPSY (ESWL);  Surgeon: Raynelle Bring, MD;  Location:  [facility];  Service: Urology;  Laterality: Left;  . HOLMIUM LASER APPLICATION Left 10/16/1273   Procedure: HOLMIUM LASER APPLICATION left ureter ;  Surgeon: Raynelle Bring, MD;  [facility];  Service: Urology;  Laterality: Left;  . PARATHYROIDECTOMY Right 05/03/2015   Procedure: RIGHT SUPERIOR PARATHYROIDECTOMY;  Surgeon: Armandina Gemma, MD;  Location: [facility];  Service: General;  Laterality: Right;  . PATELLA REALIGNMENT Bilateral   . REPLACEMENT TOTAL KNEE BILATERAL    . ROTATOR CUFF REPAIR Right    x2  . SHOULDER ARTHROSCOPY Right    x 2  . SHOULDER ARTHROSCOPY W/ ROTATOR CUFF REPAIR Left 04/26/2002  . TONSILLECTOMY    . TOTAL KNEE ARTHROPLASTY Left 09/10/2003  . TOTAL KNEE ARTHROPLASTY Right 11/10/2004  . TRIGGER FINGER RELEASE Right 08/06/2008   ring finger  . TRIGGER FINGER RELEASE     multiple - right thumb, left index/long/ring/thumb  . ULNA OSTEOTOMY Right 07/28/2017   Procedure: REVISION RIGHT ULNAR SHORTENING OSTEOTOMY;  Surgeon: Leandrew Koyanagi, MD;  Location: [facility];  Service: Orthopedics;  Laterality: Right;  . WRIST ARTHROSCOPY Left 06/16/2000  . WRIST ARTHROSCOPY WITH ULNA SHORTENING Right 07/07/2017   Procedure: RIGHT WRIST ARTHROSCOPY WITH ULNAR SHORTENING OSTEOTOMY;  Surgeon: Leandrew Koyanagi, MD;  Location: [facility];  Service: Orthopedics;  Laterality: Right;    Family History  Problem Relation Age of Onset  . Stroke Mother   . Cancer Mother 53       bone cancer  . Diabetes Sister     Social History   Socioeconomic History  . Marital status: Single    Spouse name: Not on file  . Number of children: 1  . Years of education: Not on file  . Highest education level: Not on file  Occupational History    Employer: [employer]  Social Needs  . Financial resource strain: Not on file  . Food insecurity:    Worry: Not on file    Inability: Not on file  . Transportation needs:    Medical: Not on file    Non-medical: Not on file   Tobacco Use  . Smoking status: Never Smoker  . Smokeless tobacco: Never Used  Substance and Sexual Activity  . Alcohol use: No  . Drug use: No  . Sexual activity: Never  Lifestyle  . Physical activity:    Days per week: Not on file    Minutes per session: Not on file  . Stress: Not on file  Relationships  . Social connections:    Talks on phone: Not on file    Gets together: Not on file    Attends religious service: Not on file    Active member of club or organization: Not on file    Attends meetings of clubs or organizations: Not on file    Relationship status: Not on file  Other Topics Concern  . Not on file  Social History Narrative   Patient is single, has 1 child   Patient is right handed   Caffeine consumption is 0     Current Outpatient Medications:  .  b complex vitamins tablet, Take 1 tablet by  mouth daily., Disp: , Rfl:  .  calcium-vitamin D (OSCAL WITH D) 500-200 MG-UNIT tablet, Take 1 tablet by mouth 3 (three) times daily., Disp: 90 tablet, Rfl: 12 .  cholecalciferol (VITAMIN D) 1000 units tablet, Take 2,000 Units by mouth daily., Disp: , Rfl:  .  hydrochlorothiazide (HYDRODIURIL) 12.5 MG tablet, Take 12.5 mg by mouth daily., Disp: , Rfl:  .  L-Methylfolate-Algae-B12-B6 (METANX) 3-90.314-2-35 MG CAPS, Take by mouth 2 (two) times daily., Disp: 180 capsule, Rfl: 0 .  lidocaine-prilocaine (EMLA) cream, APPLY FOUR TIMES PER DAY AS NEEDED., Disp: 270 g, Rfl: 3 .  lisinopril (PRINIVIL,ZESTRIL) 10 MG tablet, TAKE 1 TABLET BY MOUTH  DAILY, Disp: 90 tablet, Rfl: 1 .  Multiple Vitamin (MULTIVITAMIN) tablet, Take 1 tablet by mouth daily., Disp: , Rfl:  .  nortriptyline (PAMELOR) 25 MG capsule, TAKE 3 CAPSULES BY MOUTH AT BEDTIME, Disp: 270 capsule, Rfl: 4 .  pregabalin (LYRICA) 100 MG capsule, Take 1 capsule (100 mg total) by mouth 2 (two) times daily., Disp: 180 capsule, Rfl: 2 .  traMADol (ULTRAM) 50 MG tablet, Take one to two tablets by mouth every 6 hours if needed for  pain, Prescribed by Neurology, Disp: 180 tablet, Rfl: 4  EXAM:  Vitals:   03/01/18 1407  BP: 98/68  Pulse: (!) 112  Temp: 98.3 F (36.8 C)  SpO2: 98%    Body mass index is 40.85 kg/m.  GENERAL: vitals reviewed and listed above, alert, oriented, appears well hydrated and in no acute distress  HEENT: atraumatic, conjunttiva clear, no obvious abnormalities on inspection of external nose and ears  NECK: no obvious masses on inspection, no carotid bruits  LUNGS: clear to auscultation bilaterally, no wheezes, rales or rhonchi, good air movement  CV: HRRR, no peripheral edema, BP normal range, normal radial pulses  MS: moves all four extremities  PSYCH: pleasant and cooperative, no obvious depression or anxiety  ASSESSMENT AND PLAN:  Discussed the following assessment and plan:  Administrative encounter -form completed  Essential hypertension -continue lisinopril 10 mg and HCTZ 12.5 mg  Recurrent pain of right knee -defer to ortho  Need for Tdap vaccination - Plan: Tdap vaccine greater than or equal to 7yo IM  Assessment: -Risk factors: none -Surgery Risks:intermediate -age, nutritional status, fraility: good nutritional status, age >81, no fraility -functional capacity: > 4 METs without symptoms -comorbidities: none Patient Specific Risks: patient is low risk for intermediate risk surgery  Recommendations for optimizing general medical care prior to surgery: -advised patient to discuss specific risks morbidity and mortality of surgery with surgeon, CV risks discussed with patient -advised patient will defer to surgeon for post-op DVT prophylaxis and post op care -no specific medical recommendations for this patient at this time and no recommendations to defer surgery or for further CV testing prior to surgery -form for pre-op optimization of general medical care prior to surgery faxed to surgeon office  There are no Patient Instructions on file for this  visit.   Billie Ruddy, MD

## 2018-04-11 ENCOUNTER — Other Ambulatory Visit: Payer: Self-pay | Admitting: Orthopedic Surgery

## 2018-04-21 ENCOUNTER — Encounter (HOSPITAL_COMMUNITY): Payer: Self-pay

## 2018-04-21 NOTE — Pre-Procedure Instructions (Signed)
EKG 09/13/2017 in epic.

## 2018-04-21 NOTE — Patient Instructions (Signed)
Your procedure is scheduled on: Monday, Dec. 23, 2019   Surgery Time:  11:20AM-12:35PM   Report to [facility]  Entrance    Report to admitting at 8:45 AM   Call this number if you have problems the morning of surgery [phone]   Do not eat food or drink liquids :After Midnight.   Brush your teeth the morning of surgery.   Do NOT smoke after Midnight   Take these medicines the morning of surgery with A SIP OF WATER: [place]                               You may not have any metal on your body including hair pins, jewelry, and body piercings             Do not wear make-up, lotions, powders, perfumes/cologne, or deodorant             Do not wear nail polish.  Do not shave  48 hours prior to surgery.               Do not bring valuables to the hospital. [place].   Contacts, dentures or bridgework may not be worn into surgery.   Leave suitcase in the car. After surgery it may be brought to your room.   Special Instructions: Bring a copy of your healthcare power of attorney and living will documents         the day of surgery if you haven't scanned them in before.              Please read over the following fact sheets you were given:  [facility] - Preparing for Surgery Before surgery, you can play an important role.  Because skin is not sterile, your skin needs to be as free of germs as possible.  You can reduce the number of germs on your skin by washing with CHG (chlorahexidine gluconate) soap before surgery.  CHG is an antiseptic cleaner which kills germs and bonds with the skin to continue killing germs even after washing. Please DO NOT use if you have an allergy to CHG or antibacterial soaps.  If your skin becomes reddened/irritated stop using the CHG and inform your nurse when you arrive at Short Stay. Do not shave (including legs and underarms) for at least 48 hours prior to the first CHG shower.  You  may shave your face/neck.  Please follow these instructions carefully:  1.  Shower with CHG Soap the night before surgery and the  morning of surgery.  2.  If you choose to wash your hair, wash your hair first as usual with your normal  shampoo.  3.  After you shampoo, rinse your hair and body thoroughly to remove the shampoo.                             4.  Use CHG as you would any other liquid soap.  You can apply chg directly to the skin and wash.  Gently with a scrungie or clean washcloth.  5.  Apply the CHG Soap to your body ONLY FROM THE NECK DOWN.   Do   not use on face/ open  Wound or open sores. Avoid contact with eyes, ears mouth and   genitals (private parts).                       Wash face,  Genitals (private parts) with your normal soap.             6.  Wash thoroughly, paying special attention to the area where your    surgery  will be performed.  7.  Thoroughly rinse your body with warm water from the neck down.  8.  DO NOT shower/wash with your normal soap after using and rinsing off the CHG Soap.                9.  Pat yourself dry with a clean towel.            10.  Wear clean pajamas.            11.  Place clean sheets on your bed the night of your first shower and do not  sleep with pets. Day of Surgery : Do not apply any lotions/deodorants the morning of surgery.  Please wear clean clothes to the hospital/surgery center.  FAILURE TO FOLLOW THESE INSTRUCTIONS MAY RESULT IN THE CANCELLATION OF YOUR SURGERY  PATIENT SIGNATURE_________________________________  NURSE SIGNATURE__________________________________  ________________________________________________________________________   Katherine [name]  An incentive spirometer is a tool that can help keep your lungs clear and active. This tool measures how well you are filling your lungs with each breath. Taking long deep breaths may help reverse or decrease the chance of developing breathing  (pulmonary) problems (especially infection) following:  A long period of time when you are unable to move or be active. BEFORE THE PROCEDURE   If the spirometer includes an indicator to show your best effort, your nurse or respiratory therapist will set it to a desired goal.  If possible, sit up straight or lean slightly forward. Try not to slouch.  Hold the incentive spirometer in an upright position. INSTRUCTIONS FOR USE  1. Sit on the edge of your bed if possible, or sit up as far as you can in bed or on a chair. 2. Hold the incentive spirometer in an upright position. 3. Breathe out normally. 4. Place the mouthpiece in your mouth and seal your lips tightly around it. 5. Breathe in slowly and as deeply as possible, raising the piston or the ball toward the top of the column. 6. Hold your breath for 3-5 seconds or for as long as possible. Allow the piston or ball to fall to the bottom of the column. 7. Remove the mouthpiece from your mouth and breathe out normally. 8. Rest for a few seconds and repeat Steps 1 through 7 at least 10 times every 1-2 hours when you are awake. Take your time and take a few normal breaths between deep breaths. 9. The spirometer may include an indicator to show your best effort. Use the indicator as a goal to work toward during each repetition. 10. After each set of 10 deep breaths, practice coughing to be sure your lungs are clear. If you have an incision (the cut made at the time of surgery), support your incision when coughing by placing a pillow or rolled up towels firmly against it. Once you are able to get out of bed, walk around indoors and cough well. You may stop using the incentive spirometer when instructed by your caregiver.  RISKS AND COMPLICATIONS  Take your time  so you do not get dizzy or light-headed.  If you are in pain, you may need to take or ask for pain medication before doing incentive spirometry. It is harder to take a deep breath if you  are having pain. AFTER USE  Rest and breathe slowly and easily.  It can be helpful to keep track of a log of your progress. Your caregiver can provide you with a simple table to help with this. If you are using the spirometer at home, follow these instructions: [place] IF:   You are having difficultly using the spirometer.  You have trouble using the spirometer as often as instructed.  Your pain medication is not giving enough relief while using the spirometer.  You develop fever of 100.5 F (38.1 C) or higher. SEEK IMMEDIATE MEDICAL CARE IF:   You cough up bloody sputum that had not been present before.  You develop fever of 102 F (38.9 C) or greater.  You develop worsening pain at or near the incision site. MAKE SURE YOU:   Understand these instructions.  Will watch your condition.  Will get help right away if you are not doing well or get worse. Document Released: 09/07/2006 Document Revised: 07/20/2011 Document Reviewed: 11/08/2006 [facility] Patient Information 2014 [place], [place].   ________________________________________________________________________

## 2018-04-26 ENCOUNTER — Encounter (HOSPITAL_COMMUNITY): Payer: Self-pay

## 2018-04-26 ENCOUNTER — Other Ambulatory Visit: Payer: Self-pay

## 2018-04-26 ENCOUNTER — Encounter (HOSPITAL_COMMUNITY)
Admission: RE | Admit: 2018-04-26 | Discharge: 2018-04-26 | Disposition: A | Discharge status: Home / Self Care | Payer: Medicare Other | Source: Ambulatory Visit | Attending: Orthopedic Surgery | Admitting: Orthopedic Surgery

## 2018-04-26 ENCOUNTER — Other Ambulatory Visit: Payer: Self-pay | Admitting: Internal Medicine

## 2018-04-26 ENCOUNTER — Encounter (INDEPENDENT_AMBULATORY_CARE_PROVIDER_SITE_OTHER): Payer: Self-pay

## 2018-04-26 DIAGNOSIS — Z01812 Encounter for preprocedural laboratory examination: Secondary | ICD-10-CM | POA: Insufficient documentation

## 2018-04-26 HISTORY — DX: Impingement syndrome of right shoulder: M75.41

## 2018-04-26 HISTORY — DX: Prediabetes: R73.03

## 2018-04-26 HISTORY — DX: Other intervertebral disc degeneration, lumbar region: M51.36

## 2018-04-26 HISTORY — DX: Sciatica, unspecified side: M54.30

## 2018-04-26 HISTORY — DX: Personal history of other endocrine, nutritional and metabolic disease: Z86.39

## 2018-04-26 HISTORY — DX: Gastro-esophageal reflux disease without esophagitis: K21.9

## 2018-04-26 HISTORY — DX: Lymphedema, not elsewhere classified: I89.0

## 2018-04-26 HISTORY — DX: Obesity, unspecified: E66.9

## 2018-04-26 HISTORY — DX: Personal history of diseases of the blood and blood-forming organs and certain disorders involving the immune mechanism: Z86.2

## 2018-04-26 HISTORY — DX: Personal history of other diseases of urinary system: Z87.448

## 2018-04-26 LAB — CBC WITH DIFFERENTIAL/PLATELET
Abs Immature Granulocytes: 0.02 10*3/uL (ref 0.00–0.07)
Basophils Absolute: 0 10*3/uL (ref 0.0–0.1)
Basophils Relative: 1 %
Eosinophils Absolute: 0.1 10*3/uL (ref 0.0–0.5)
Eosinophils Relative: 3 %
HCT: 39.6 % (ref 36.0–46.0)
Hemoglobin: 12.7 g/dL (ref 12.0–15.0)
Immature Granulocytes: 1 %
Lymphocytes Relative: 37 %
Lymphs Abs: 1.5 10*3/uL (ref 0.7–4.0)
MCH: 29.2 pg (ref 26.0–34.0)
MCHC: 32.1 g/dL (ref 30.0–36.0)
MCV: 91 fL (ref 80.0–100.0)
MONOS PCT: 11 %
Monocytes Absolute: 0.4 10*3/uL (ref 0.1–1.0)
Neutro Abs: 2 10*3/uL (ref 1.7–7.7)
Neutrophils Relative %: 47 %
Platelets: 173 10*3/uL (ref 150–400)
RBC: 4.35 MIL/uL (ref 3.87–5.11)
RDW: 14.3 % (ref 11.5–15.5)
WBC: 4.1 10*3/uL (ref 4.0–10.5)
nRBC: 0 % (ref 0.0–0.2)

## 2018-04-26 LAB — COMPREHENSIVE METABOLIC PANEL
ALT: 16 U/L (ref 0–44)
AST: 68 U/L — ABNORMAL HIGH (ref 15–41)
Albumin: 3.9 g/dL (ref 3.5–5.0)
Alkaline Phosphatase: 63 U/L (ref 38–126)
Anion gap: 8 (ref 5–15)
BILIRUBIN TOTAL: 0.9 mg/dL (ref 0.3–1.2)
BUN: 12 mg/dL (ref 8–23)
CO2: 24 mmol/L (ref 22–32)
CREATININE: 0.99 mg/dL (ref 0.44–1.00)
Calcium: 10 mg/dL (ref 8.9–10.3)
Chloride: 106 mmol/L (ref 98–111)
GFR calc Af Amer: >60 mL/min (ref >60)
GFR calc non Af Amer: 58 mL/min — ABNORMAL LOW (ref >60)
Glucose, Bld: 151 mg/dL — ABNORMAL HIGH (ref 70–99)
Potassium: 4.9 mmol/L (ref 3.5–5.1)
Sodium: 138 mmol/L (ref 135–145)
Total Protein: 6.9 g/dL (ref 6.5–8.1)

## 2018-04-26 LAB — SURGICAL PCR SCREEN
MRSA, PCR: NEGATIVE
Staphylococcus aureus: NEGATIVE

## 2018-04-26 LAB — HEMOGLOBIN A1C
Hgb A1c MFr Bld: 5.9 % — ABNORMAL HIGH (ref 4.8–5.6)
Mean Plasma Glucose: 122.63 mg/dL

## 2018-04-26 NOTE — Pre-Procedure Instructions (Signed)
CMP and Hgb A1C results 04/26/2018 sent to Dr. Ronnie Derby via epic.

## 2018-04-27 ENCOUNTER — Other Ambulatory Visit: Payer: Self-pay | Admitting: Orthopedic Surgery

## 2018-04-29 NOTE — Progress Notes (Signed)
Katherine [name] made aware to arrive at 6:55AM, verbalized understanding.

## 2018-05-01 MED ORDER — BUPIVACAINE LIPOSOME 1.3 % IJ SUSP
20.0000 mL | INTRAMUSCULAR | Status: DC
  Filled 2018-05-01: qty 20

## 2018-05-02 ENCOUNTER — Encounter (HOSPITAL_COMMUNITY): Payer: Self-pay | Admitting: Emergency Medicine

## 2018-05-02 ENCOUNTER — Encounter (HOSPITAL_COMMUNITY)
Admission: RE | Disposition: A | Discharge status: Home Health | Payer: Self-pay | Source: Home / Self Care | Attending: Orthopedic Surgery

## 2018-05-02 ENCOUNTER — Other Ambulatory Visit: Payer: Self-pay

## 2018-05-02 ENCOUNTER — Inpatient Hospital Stay (HOSPITAL_COMMUNITY)
Admission: RE | Admit: 2018-05-02 | Discharge: 2018-05-03 | DRG: 488 | Disposition: A | Discharge status: Home Health | Payer: Medicare Other | Attending: Orthopedic Surgery | Admitting: Orthopedic Surgery

## 2018-05-02 ENCOUNTER — Inpatient Hospital Stay (HOSPITAL_COMMUNITY): Payer: Medicare Other | Admitting: Anesthesiology

## 2018-05-02 DIAGNOSIS — Z96653 Presence of artificial knee joint, bilateral: Secondary | ICD-10-CM | POA: Diagnosis present

## 2018-05-02 DIAGNOSIS — Z881 Allergy status to other antibiotic agents status: Secondary | ICD-10-CM

## 2018-05-02 DIAGNOSIS — Y792 Prosthetic and other implants, materials and accessory orthopedic devices associated with adverse incidents: Secondary | ICD-10-CM | POA: Diagnosis present

## 2018-05-02 DIAGNOSIS — Z96659 Presence of unspecified artificial knee joint: Secondary | ICD-10-CM

## 2018-05-02 DIAGNOSIS — Z79891 Long term (current) use of opiate analgesic: Secondary | ICD-10-CM

## 2018-05-02 DIAGNOSIS — Z79899 Other long term (current) drug therapy: Secondary | ICD-10-CM

## 2018-05-02 DIAGNOSIS — E785 Hyperlipidemia, unspecified: Secondary | ICD-10-CM | POA: Diagnosis present

## 2018-05-02 DIAGNOSIS — T84033A Mechanical loosening of internal left knee prosthetic joint, initial encounter: Principal | ICD-10-CM | POA: Diagnosis present

## 2018-05-02 DIAGNOSIS — K219 Gastro-esophageal reflux disease without esophagitis: Secondary | ICD-10-CM | POA: Diagnosis present

## 2018-05-02 DIAGNOSIS — R7303 Prediabetes: Secondary | ICD-10-CM | POA: Diagnosis present

## 2018-05-02 DIAGNOSIS — I10 Essential (primary) hypertension: Secondary | ICD-10-CM | POA: Diagnosis present

## 2018-05-02 DIAGNOSIS — Z87442 Personal history of urinary calculi: Secondary | ICD-10-CM

## 2018-05-02 DIAGNOSIS — Z90710 Acquired absence of both cervix and uterus: Secondary | ICD-10-CM

## 2018-05-02 DIAGNOSIS — M19031 Primary osteoarthritis, right wrist: Secondary | ICD-10-CM | POA: Diagnosis present

## 2018-05-02 DIAGNOSIS — M65861 Other synovitis and tenosynovitis, right lower leg: Secondary | ICD-10-CM | POA: Diagnosis present

## 2018-05-02 DIAGNOSIS — E213 Hyperparathyroidism, unspecified: Secondary | ICD-10-CM | POA: Diagnosis present

## 2018-05-02 DIAGNOSIS — Z833 Family history of diabetes mellitus: Secondary | ICD-10-CM

## 2018-05-02 DIAGNOSIS — Z808 Family history of malignant neoplasm of other organs or systems: Secondary | ICD-10-CM

## 2018-05-02 DIAGNOSIS — Z823 Family history of stroke: Secondary | ICD-10-CM

## 2018-05-02 DIAGNOSIS — M5136 Other intervertebral disc degeneration, lumbar region: Secondary | ICD-10-CM | POA: Diagnosis present

## 2018-05-02 DIAGNOSIS — Z6841 Body Mass Index (BMI) 40.0 and over, adult: Secondary | ICD-10-CM

## 2018-05-02 HISTORY — PX: TOTAL KNEE ARTHROPLASTY: SHX125

## 2018-05-02 SURGERY — ARTHROPLASTY, KNEE, TOTAL
Anesthesia: Regional | Site: Knee | Laterality: Right

## 2018-05-02 MED ORDER — BUPIVACAINE-EPINEPHRINE (PF) 0.25% -1:200000 IJ SOLN
INTRAMUSCULAR | Status: AC
  Filled 2018-05-02: qty 30

## 2018-05-02 MED ORDER — HYDRALAZINE HCL 20 MG/ML IJ SOLN
5.0000 mg | Freq: Once | INTRAMUSCULAR | Status: AC
  Administered 2018-05-02: 5 mg via INTRAVENOUS

## 2018-05-02 MED ORDER — CHLORHEXIDINE GLUCONATE 4 % EX LIQD: 60.0000 mL | Freq: Once | CUTANEOUS | Status: DC

## 2018-05-02 MED ORDER — LACTATED RINGERS IV SOLN
INTRAVENOUS | Status: DC
  Administered 2018-05-02 (×2): via INTRAVENOUS

## 2018-05-02 MED ORDER — PROPOFOL 10 MG/ML IV BOLUS
INTRAVENOUS | Status: AC
  Filled 2018-05-02: qty 60

## 2018-05-02 MED ORDER — BUPIVACAINE-EPINEPHRINE (PF) 0.5% -1:200000 IJ SOLN
INTRAMUSCULAR | Status: DC | PRN
  Administered 2018-05-02: 20 mL via PERINEURAL

## 2018-05-02 MED ORDER — TRAMADOL HCL 50 MG PO TABS
50.0000 mg | ORAL_TABLET | Freq: Four times a day (QID) | ORAL | Status: DC
  Administered 2018-05-02 – 2018-05-03 (×3): 50 mg via ORAL
  Filled 2018-05-02 (×3): qty 1

## 2018-05-02 MED ORDER — GABAPENTIN 300 MG PO CAPS: 300.0000 mg | ORAL_CAPSULE | Freq: Three times a day (TID) | ORAL | Status: DC

## 2018-05-02 MED ORDER — TRANEXAMIC ACID-NACL 1000-0.7 MG/100ML-% IV SOLN
1000.0000 mg | Freq: Once | INTRAVENOUS | Status: AC
  Administered 2018-05-02: 1000 mg via INTRAVENOUS
  Filled 2018-05-02: qty 100

## 2018-05-02 MED ORDER — FENTANYL CITRATE (PF) 100 MCG/2ML IJ SOLN
INTRAMUSCULAR | Status: DC | PRN
  Administered 2018-05-02 (×2): 50 ug via INTRAVENOUS

## 2018-05-02 MED ORDER — MIDAZOLAM HCL 2 MG/2ML IJ SOLN
1.0000 mg | Freq: Once | INTRAMUSCULAR | Status: AC
  Administered 2018-05-02: 1 mg via INTRAVENOUS

## 2018-05-02 MED ORDER — METHOCARBAMOL 500 MG PO TABS
500.0000 mg | ORAL_TABLET | Freq: Four times a day (QID) | ORAL | Status: DC | PRN
  Administered 2018-05-02: 500 mg via ORAL
  Filled 2018-05-02 (×2): qty 1

## 2018-05-02 MED ORDER — PROPOFOL 500 MG/50ML IV EMUL
INTRAVENOUS | Status: DC | PRN
  Administered 2018-05-02: 75 ug/kg/min via INTRAVENOUS

## 2018-05-02 MED ORDER — ALUM & MAG HYDROXIDE-SIMETH 200-200-20 MG/5ML PO SUSP: 30.0000 mL | ORAL | Status: DC | PRN

## 2018-05-02 MED ORDER — HYDROMORPHONE HCL 1 MG/ML IJ SOLN
INTRAMUSCULAR | Status: AC
  Filled 2018-05-02: qty 2

## 2018-05-02 MED ORDER — FLEET ENEMA 7-19 GM/118ML RE ENEM: 1.0000 | ENEMA | Freq: Once | RECTAL | Status: DC | PRN

## 2018-05-02 MED ORDER — TRANEXAMIC ACID-NACL 1000-0.7 MG/100ML-% IV SOLN
1000.0000 mg | INTRAVENOUS | Status: AC
  Administered 2018-05-02: 1000 mg via INTRAVENOUS
  Filled 2018-05-02: qty 100

## 2018-05-02 MED ORDER — TRANEXAMIC ACID 1000 MG/10ML IV SOLN
2000.0000 mg | Freq: Once | INTRAVENOUS | Status: AC
  Administered 2018-05-02: 2000 mg via TOPICAL
  Filled 2018-05-02: qty 20

## 2018-05-02 MED ORDER — ONDANSETRON HCL 4 MG PO TABS: 4.0000 mg | ORAL_TABLET | Freq: Four times a day (QID) | ORAL | Status: DC | PRN

## 2018-05-02 MED ORDER — FENTANYL CITRATE (PF) 100 MCG/2ML IJ SOLN
INTRAMUSCULAR | Status: AC
  Filled 2018-05-02: qty 2

## 2018-05-02 MED ORDER — MIDAZOLAM HCL 2 MG/2ML IJ SOLN
INTRAMUSCULAR | Status: AC
  Filled 2018-05-02: qty 2

## 2018-05-02 MED ORDER — FENTANYL CITRATE (PF) 100 MCG/2ML IJ SOLN
50.0000 ug | INTRAMUSCULAR | Status: DC
  Filled 2018-05-02: qty 2

## 2018-05-02 MED ORDER — HYDRALAZINE HCL 20 MG/ML IJ SOLN
INTRAMUSCULAR | Status: AC
  Filled 2018-05-02: qty 1

## 2018-05-02 MED ORDER — DOCUSATE SODIUM 100 MG PO CAPS
100.0000 mg | ORAL_CAPSULE | Freq: Two times a day (BID) | ORAL | Status: DC
  Administered 2018-05-02 – 2018-05-03 (×2): 100 mg via ORAL
  Filled 2018-05-02 (×2): qty 1

## 2018-05-02 MED ORDER — MIDAZOLAM HCL 2 MG/2ML IJ SOLN
1.0000 mg | INTRAMUSCULAR | Status: DC
  Administered 2018-05-02: 2 mg via INTRAVENOUS
  Filled 2018-05-02: qty 2

## 2018-05-02 MED ORDER — HYDROCHLOROTHIAZIDE 25 MG PO TABS
12.5000 mg | ORAL_TABLET | Freq: Every day | ORAL | Status: DC
  Filled 2018-05-02: qty 1

## 2018-05-02 MED ORDER — BUPIVACAINE IN DEXTROSE 0.75-8.25 % IT SOLN
INTRATHECAL | Status: DC | PRN
  Administered 2018-05-02: 1.8 mL via INTRATHECAL

## 2018-05-02 MED ORDER — SODIUM CHLORIDE 0.9 % IV SOLN
INTRAVENOUS | Status: DC
  Administered 2018-05-02 – 2018-05-03 (×2): via INTRAVENOUS

## 2018-05-02 MED ORDER — ONDANSETRON HCL 4 MG/2ML IJ SOLN
INTRAMUSCULAR | Status: DC | PRN
  Administered 2018-05-02: 4 mg via INTRAVENOUS

## 2018-05-02 MED ORDER — SODIUM CHLORIDE 0.9 % IR SOLN
Status: DC | PRN
  Administered 2018-05-02: 1000 mL

## 2018-05-02 MED ORDER — LIDOCAINE-PRILOCAINE 2.5-2.5 % EX CREA
TOPICAL_CREAM | Freq: Once | CUTANEOUS | Status: AC
  Administered 2018-05-02: 1 via TOPICAL
  Filled 2018-05-02: qty 5

## 2018-05-02 MED ORDER — DIPHENHYDRAMINE HCL 12.5 MG/5ML PO ELIX: 12.5000 mg | ORAL_SOLUTION | ORAL | Status: DC | PRN

## 2018-05-02 MED ORDER — BUPIVACAINE LIPOSOME 1.3 % IJ SUSP
INTRAMUSCULAR | Status: DC | PRN
  Administered 2018-05-02: 20 mL

## 2018-05-02 MED ORDER — CEFAZOLIN SODIUM-DEXTROSE 2-4 GM/100ML-% IV SOLN
2.0000 g | INTRAVENOUS | Status: AC
  Administered 2018-05-02: 2 g via INTRAVENOUS
  Filled 2018-05-02: qty 100

## 2018-05-02 MED ORDER — DEXAMETHASONE SODIUM PHOSPHATE 10 MG/ML IJ SOLN: 8.0000 mg | Freq: Once | INTRAMUSCULAR | Status: DC

## 2018-05-02 MED ORDER — MIDAZOLAM HCL 5 MG/5ML IJ SOLN
INTRAMUSCULAR | Status: DC | PRN
  Administered 2018-05-02: 0.5 mg via INTRAVENOUS
  Administered 2018-05-02: 1 mg via INTRAVENOUS
  Administered 2018-05-02: 0.5 mg via INTRAVENOUS

## 2018-05-02 MED ORDER — OXYCODONE HCL 5 MG PO TABS
5.0000 mg | ORAL_TABLET | ORAL | Status: DC | PRN
  Administered 2018-05-02: 10 mg via ORAL
  Filled 2018-05-02: qty 2

## 2018-05-02 MED ORDER — ASPIRIN EC 325 MG PO TBEC
325.0000 mg | DELAYED_RELEASE_TABLET | Freq: Two times a day (BID) | ORAL | Status: DC
  Administered 2018-05-03: 325 mg via ORAL
  Filled 2018-05-02: qty 1

## 2018-05-02 MED ORDER — ZOLPIDEM TARTRATE 5 MG PO TABS: 5.0000 mg | ORAL_TABLET | Freq: Every evening | ORAL | Status: DC | PRN

## 2018-05-02 MED ORDER — GABAPENTIN 300 MG PO CAPS
300.0000 mg | ORAL_CAPSULE | Freq: Once | ORAL | Status: AC
  Administered 2018-05-02: 300 mg via ORAL
  Filled 2018-05-02: qty 1

## 2018-05-02 MED ORDER — LISINOPRIL 10 MG PO TABS
10.0000 mg | ORAL_TABLET | Freq: Every day | ORAL | Status: DC
  Administered 2018-05-02 – 2018-05-03 (×2): 10 mg via ORAL
  Filled 2018-05-02 (×2): qty 1

## 2018-05-02 MED ORDER — HYDROMORPHONE HCL 1 MG/ML IJ SOLN: 0.5000 mg | INTRAMUSCULAR | Status: DC | PRN

## 2018-05-02 MED ORDER — HYDROMORPHONE HCL 1 MG/ML IJ SOLN
0.2500 mg | INTRAMUSCULAR | Status: DC | PRN
  Administered 2018-05-02 (×2): 0.5 mg via INTRAVENOUS

## 2018-05-02 MED ORDER — SENNOSIDES-DOCUSATE SODIUM 8.6-50 MG PO TABS: 1.0000 | ORAL_TABLET | Freq: Every evening | ORAL | Status: DC | PRN

## 2018-05-02 MED ORDER — METOCLOPRAMIDE HCL 5 MG/ML IJ SOLN: 5.0000 mg | Freq: Three times a day (TID) | INTRAMUSCULAR | Status: DC | PRN

## 2018-05-02 MED ORDER — FENTANYL CITRATE (PF) 100 MCG/2ML IJ SOLN
25.0000 ug | INTRAMUSCULAR | Status: DC | PRN
  Administered 2018-05-02 (×3): 50 ug via INTRAVENOUS

## 2018-05-02 MED ORDER — ACETAMINOPHEN 500 MG PO TABS
1000.0000 mg | ORAL_TABLET | Freq: Once | ORAL | Status: AC
  Administered 2018-05-02: 1000 mg via ORAL
  Filled 2018-05-02: qty 2

## 2018-05-02 MED ORDER — DEXAMETHASONE SODIUM PHOSPHATE 4 MG/ML IJ SOLN
INTRAMUSCULAR | Status: DC | PRN
  Administered 2018-05-02: 10 mg via INTRAVENOUS

## 2018-05-02 MED ORDER — METOCLOPRAMIDE HCL 5 MG PO TABS: 5.0000 mg | ORAL_TABLET | Freq: Three times a day (TID) | ORAL | Status: DC | PRN

## 2018-05-02 MED ORDER — CEFAZOLIN SODIUM-DEXTROSE 2-4 GM/100ML-% IV SOLN
2.0000 g | Freq: Four times a day (QID) | INTRAVENOUS | Status: AC
  Administered 2018-05-02 (×2): 2 g via INTRAVENOUS
  Filled 2018-05-02 (×2): qty 100

## 2018-05-02 MED ORDER — ACETAMINOPHEN 500 MG PO TABS
1000.0000 mg | ORAL_TABLET | Freq: Four times a day (QID) | ORAL | Status: DC
  Administered 2018-05-02 – 2018-05-03 (×3): 1000 mg via ORAL
  Filled 2018-05-02 (×3): qty 2

## 2018-05-02 MED ORDER — FENTANYL CITRATE (PF) 100 MCG/2ML IJ SOLN
INTRAMUSCULAR | Status: AC
  Administered 2018-05-02: 50 ug via INTRAVENOUS
  Filled 2018-05-02: qty 2

## 2018-05-02 MED ORDER — MENTHOL 3 MG MT LOZG: 1.0000 | LOZENGE | OROMUCOSAL | Status: DC | PRN

## 2018-05-02 MED ORDER — PHENOL 1.4 % MT LIQD: 1.0000 | OROMUCOSAL | Status: DC | PRN

## 2018-05-02 MED ORDER — BUPIVACAINE-EPINEPHRINE 0.5% -1:200000 IJ SOLN
INTRAMUSCULAR | Status: DC | PRN
  Administered 2018-05-02: 20 mL

## 2018-05-02 MED ORDER — DEXAMETHASONE SODIUM PHOSPHATE 10 MG/ML IJ SOLN
10.0000 mg | Freq: Once | INTRAMUSCULAR | Status: AC
  Administered 2018-05-03: 10 mg via INTRAVENOUS
  Filled 2018-05-02: qty 1

## 2018-05-02 MED ORDER — BISACODYL 5 MG PO TBEC: 5.0000 mg | DELAYED_RELEASE_TABLET | Freq: Every day | ORAL | Status: DC | PRN

## 2018-05-02 MED ORDER — FERROUS SULFATE 325 (65 FE) MG PO TABS
325.0000 mg | ORAL_TABLET | Freq: Three times a day (TID) | ORAL | Status: DC
  Administered 2018-05-03: 325 mg via ORAL
  Filled 2018-05-02: qty 1

## 2018-05-02 MED ORDER — METHOCARBAMOL 500 MG IVPB - SIMPLE MED
500.0000 mg | Freq: Four times a day (QID) | INTRAVENOUS | Status: DC | PRN
  Filled 2018-05-02: qty 50

## 2018-05-02 MED ORDER — NORTRIPTYLINE HCL 25 MG PO CAPS
75.0000 mg | ORAL_CAPSULE | Freq: Every day | ORAL | Status: DC
  Administered 2018-05-02: 75 mg via ORAL
  Filled 2018-05-02: qty 3

## 2018-05-02 MED ORDER — PANTOPRAZOLE SODIUM 40 MG PO TBEC
40.0000 mg | DELAYED_RELEASE_TABLET | Freq: Every day | ORAL | Status: DC
  Administered 2018-05-03: 40 mg via ORAL
  Filled 2018-05-02: qty 1

## 2018-05-02 MED ORDER — CYCLOSPORINE 0.05 % OP EMUL
1.0000 [drp] | Freq: Two times a day (BID) | OPHTHALMIC | Status: DC
  Administered 2018-05-02 – 2018-05-03 (×2): 1 [drp] via OPHTHALMIC
  Filled 2018-05-02 (×2): qty 1

## 2018-05-02 MED ORDER — SODIUM CHLORIDE (PF) 0.9 % IJ SOLN
INTRAMUSCULAR | Status: AC
  Filled 2018-05-02: qty 20

## 2018-05-02 MED ORDER — PREGABALIN 100 MG PO CAPS
100.0000 mg | ORAL_CAPSULE | Freq: Two times a day (BID) | ORAL | Status: DC
  Administered 2018-05-02 – 2018-05-03 (×2): 100 mg via ORAL
  Filled 2018-05-02 (×2): qty 1

## 2018-05-02 MED ORDER — ONDANSETRON HCL 4 MG/2ML IJ SOLN: 4.0000 mg | Freq: Four times a day (QID) | INTRAMUSCULAR | Status: DC | PRN

## 2018-05-02 SURGICAL SUPPLY — 56 items
ARTI SURF NEXGEN C/D 14 3/4 (Knees) ×2 IMPLANT
BAG SPEC THK2 15X12 ZIP CLS (MISCELLANEOUS) ×1
BAG ZIPLOCK 12X15 (MISCELLANEOUS) ×3 IMPLANT
BLADE SAGITTAL 13X1.27X60 (BLADE) ×2 IMPLANT
BLADE SAGITTAL 13X1.27X60MM (BLADE) ×1
BLADE SAW SGTL 83.5X18.5 (BLADE) ×3 IMPLANT
BLADE SURG 15 STRL LF DISP TIS (BLADE) ×1 IMPLANT
BLADE SURG 15 STRL SS (BLADE) ×3
BLADE SURG SZ10 CARB STEEL (BLADE) ×8 IMPLANT
BNDG CMPR MED 10X6 ELC LF (GAUZE/BANDAGES/DRESSINGS) ×1
BNDG ELASTIC 6X10 VLCR STRL LF (GAUZE/BANDAGES/DRESSINGS) ×2 IMPLANT
BOWL SMART MIX CTS (DISPOSABLE) ×3 IMPLANT
CLOSURE WOUND 1/2 X4 (GAUZE/BANDAGES/DRESSINGS) ×1
COVER SURGICAL LIGHT HANDLE (MISCELLANEOUS) ×3 IMPLANT
COVER WAND RF STERILE (DRAPES) IMPLANT
CUFF TOURN SGL QUICK 34 (TOURNIQUET CUFF) ×3
CUFF TRNQT CYL 34X4X40X1 (TOURNIQUET CUFF) ×1 IMPLANT
DECANTER SPIKE VIAL GLASS SM (MISCELLANEOUS) ×6 IMPLANT
DRAPE INCISE IOBAN 66X45 STRL (DRAPES) ×6 IMPLANT
DRAPE U-SHAPE 47X51 STRL (DRAPES) ×3 IMPLANT
DRESSING AQUACEL AG SP 3.5X10 (GAUZE/BANDAGES/DRESSINGS) IMPLANT
DRSG AQUACEL AG ADV 3.5X10 (GAUZE/BANDAGES/DRESSINGS) ×3 IMPLANT
DRSG AQUACEL AG SP 3.5X10 (GAUZE/BANDAGES/DRESSINGS) ×3
DURAPREP 26ML APPLICATOR (WOUND CARE) ×6 IMPLANT
ELECT REM PT RETURN 15FT ADLT (MISCELLANEOUS) ×3 IMPLANT
GLOVE BIOGEL M STRL SZ7.5 (GLOVE) ×3 IMPLANT
GLOVE BIOGEL PI IND STRL 7.5 (GLOVE) ×1 IMPLANT
GLOVE BIOGEL PI IND STRL 8.5 (GLOVE) ×2 IMPLANT
GLOVE BIOGEL PI INDICATOR 7.5 (GLOVE) ×2
GLOVE BIOGEL PI INDICATOR 8.5 (GLOVE) ×4
GLOVE SURG ORTHO 8.0 STRL STRW (GLOVE) ×9 IMPLANT
GOWN STRL REUS W/ TWL XL LVL3 (GOWN DISPOSABLE) ×2 IMPLANT
GOWN STRL REUS W/TWL 2XL LVL3 (GOWN DISPOSABLE) ×3 IMPLANT
GOWN STRL REUS W/TWL XL LVL3 (GOWN DISPOSABLE) ×6
HANDPIECE INTERPULSE COAX TIP (DISPOSABLE) ×3
HOLDER FOLEY CATH W/STRAP (MISCELLANEOUS) ×3 IMPLANT
HOOD PEEL AWAY FLYTE STAYCOOL (MISCELLANEOUS) ×9 IMPLANT
MANIFOLD NEPTUNE II (INSTRUMENTS) ×3 IMPLANT
NEEDLE HYPO 22GX1.5 SAFETY (NEEDLE) ×2 IMPLANT
NS IRRIG 1000ML POUR BTL (IV SOLUTION) ×3 IMPLANT
PACK TOTAL KNEE CUSTOM (KITS) ×3 IMPLANT
PROTECTOR NERVE ULNAR (MISCELLANEOUS) ×3 IMPLANT
SET HNDPC FAN SPRY TIP SCT (DISPOSABLE) ×1 IMPLANT
STAPLER VISISTAT 35W (STAPLE) ×2 IMPLANT
STRIP CLOSURE SKIN 1/2X4 (GAUZE/BANDAGES/DRESSINGS) ×2 IMPLANT
SUT BONE WAX W31G (SUTURE) ×3 IMPLANT
SUT MNCRL AB 3-0 PS2 18 (SUTURE) ×3 IMPLANT
SUT STRATAFIX 0 PDS 27 VIOLET (SUTURE) ×3
SUT STRATAFIX PDS+ 0 24IN (SUTURE) ×3 IMPLANT
SUT VIC AB 1 CT1 36 (SUTURE) ×3 IMPLANT
SUTURE STRATFX 0 PDS 27 VIOLET (SUTURE) ×1 IMPLANT
SYR CONTROL 10ML LL (SYRINGE) ×6 IMPLANT
TRAY FOLEY MTR SLVR 16FR STAT (SET/KITS/TRAYS/PACK) ×3 IMPLANT
WATER STERILE IRR 1000ML POUR (IV SOLUTION) ×6 IMPLANT
WRAP KNEE MAXI GEL POST OP (GAUZE/BANDAGES/DRESSINGS) ×3 IMPLANT
YANKAUER SUCT BULB TIP 10FT TU (MISCELLANEOUS) ×3 IMPLANT

## 2018-05-02 NOTE — Anesthesia Postprocedure Evaluation (Signed)
Anesthesia Post Note  Patient: Katherine [name]  Procedure(s) Performed: Right polyethelene exchange and open synovectomy (Right Knee)     Patient location during evaluation: PACU Anesthesia Type: Regional and Spinal Level of consciousness: oriented and awake and alert Pain management: pain level controlled Vital Signs Assessment: post-procedure vital signs reviewed and stable Respiratory status: spontaneous breathing, respiratory function stable and patient connected to nasal cannula oxygen Cardiovascular status: blood pressure returned to baseline and stable Postop Assessment: no headache, no backache and no apparent nausea or vomiting Anesthetic complications: no    Last Vitals:  Vitals:   05/02/18 1458 05/02/18 1607  BP: (!) 138/98 (!) 143/99  Pulse: (!) 103 (!) 103  Resp: 14   Temp: 36.5 C   SpO2: 100% 97%    Last Pain:  Vitals:   05/02/18 1458  TempSrc: Oral  PainSc:                  [name]

## 2018-05-02 NOTE — Transfer of Care (Signed)
Immediate Anesthesia Transfer of Care Note  Patient: Katherine [name]  Procedure(s) Performed: Right polyethelene exchange and open synovectomy (Right Knee)  Patient Location: PACU  Anesthesia Type:Spinal and MAC combined with regional for post-op pain  Level of Consciousness: awake, alert , oriented and patient cooperative  Airway & Oxygen Therapy: Patient Spontanous Breathing and Patient connected to face mask oxygen  Post-op Assessment: Report given to RN and Post -op Vital signs reviewed and stable  Post vital signs: Reviewed and stable  Last Vitals:  Vitals Value Taken Time  BP 162/106 05/02/2018 11:45 AM  Temp    Pulse 89 05/02/2018 11:47 AM  Resp 21 05/02/2018 11:47 AM  SpO2 94 % 05/02/2018 11:47 AM  Vitals shown include unvalidated device data.  Last Pain:  Vitals:   05/02/18 1130  TempSrc:   PainSc: 0-No pain      Patients Stated Pain Goal: 4 (16/01/09 3235)  Complications: No apparent anesthesia complications

## 2018-05-02 NOTE — Evaluation (Signed)
Physical Therapy Evaluation Patient Details Name: Katherine [name] MRN: [id number] DOB: 1950/09/11 Today's Date: 05/02/2018   History of Present Illness  67 yo female s/p R knee poly-exchange (previous R TKR) on 05/02/18. 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  Clinical Impression   Pt presents with R knee pain, decreased R knee ROM, some difficulty with mobility tasks, and decreased tolerance for ambulation due to R knee pain. Pt to benefit from acute PT to address deficits. Pt ambulated 30 ft with RW with min guard assist, verbal cuing provided throughout. Pt educated on ankle pumps (20/hour) to perform this afternoon/evening to increase circulation, to pt's tolerance and limited by pain. PT to progress mobility as tolerated, and will continue to follow acutely.   Of note, PT followed WB precautions of "WBAT" per orders, and RN informed PT of pt's exact procedure of poly-exchange because op note was not in at the time of eval or at time of note writing 1919.     Follow Up Recommendations Follow surgeon's recommendation for DC plan and follow-up therapies;Supervision for mobility/OOB(HHPT )    Equipment Recommendations  None recommended by PT    Recommendations for Other Services       Precautions / Restrictions Precautions Precautions: Fall Restrictions Weight Bearing Restrictions: No Other Position/Activity Restrictions: WBAT       Mobility  Bed Mobility Overal bed mobility: Needs Assistance Bed Mobility: Supine to Sit;Sit to Supine     Supine to sit: Min guard;HOB elevated Sit to supine: Min assist;HOB elevated   General bed mobility comments: Min guard for supine to sit, increased time and effort. Min assist for sit to supine for RLE management, verbal cuing provided for sequencing.   Transfers Overall transfer  level: Needs assistance Equipment used: Rolling walker (2 wheeled) Transfers: Sit to/from Stand Sit to Stand: Min guard;From elevated surface         General transfer comment: Min guard for safety. Verbal cuing for hand placement.   Ambulation/Gait Ambulation/Gait assistance: Min guard Gait Distance (Feet): 30 Feet Assistive device: Rolling walker (2 wheeled) Gait Pattern/deviations: Step-to pattern;Decreased stance time - right;Decreased weight shift to right;Antalgic Gait velocity: decr    General Gait Details: Min guard for safety. No R knee buckling present. Verbal cuing for placement in RW, sequencing.   Stairs            Wheelchair Mobility    Modified Rankin (Stroke Patients Only)       Balance Overall balance assessment: Mild deficits observed, not formally tested                                           Pertinent Vitals/Pain Pain Assessment: 0-10 Pain Score: 7  Pain Location: R knee  Pain Descriptors / Indicators: Sore Pain Intervention(s): Limited activity within patient's tolerance;Repositioned;Ice applied;Monitored during session    [place] expects to be discharged to:: Private residence Living Arrangements: Other relatives(lives with brother ) Available Help at Discharge: Family(pt's niece to stay with pt for 2 weeks; sisters and daughter assisting PRN) Type of Home: House Home Access: Stairs to enter   Technical brewer of Steps: 1 Home Layout: One [place]: Clinical cytogeneticist - 2 wheels;Cane - single point      Prior Function Level  of Independence: Independent               Hand Dominance   Dominant Hand: Right    Extremity/Trunk Assessment   Upper Extremity Assessment Upper Extremity Assessment: Overall WFL for tasks assessed    Lower Extremity Assessment Lower Extremity Assessment: RLE deficits/detail;LLE deficits/detail RLE Deficits / Details: suspected post-surgical  weakness; able to perform ankle pumps, quad sets, SLR without assist and without quad lag, heel slides  RLE Sensation: history of peripheral neuropathy LLE Sensation: history of peripheral neuropathy    Cervical / Trunk Assessment Cervical / Trunk Assessment: Normal  Communication   Communication: No difficulties  Cognition Arousal/Alertness: Awake/alert Behavior During Therapy: WFL for tasks assessed/performed Overall Cognitive Status: Within Functional Limits for tasks assessed                                        General Comments      Exercises     Assessment/Plan    PT Assessment Patient needs continued PT services  PT Problem List Decreased strength;Pain;Decreased range of motion;Decreased activity tolerance;Decreased knowledge of use of DME;Decreased balance;Decreased safety awareness;Decreased mobility       PT Treatment Interventions DME instruction;Therapeutic activities;Gait training;Therapeutic exercise;Patient/family education;Balance training;Stair training;Functional mobility training    PT Goals (Current goals can be found in the Care Plan section)  Acute Rehab PT Goals Patient Stated Goal: go home, decrease R kne pain  PT Goal Formulation: With patient Time For Goal Achievement: 05/09/18 Potential to Achieve Goals: Good    Frequency 7X/week   Barriers to discharge        Co-evaluation               AM-PAC PT "6 Clicks" Mobility  Outcome Measure Help needed turning from your back to your side while in a flat bed without using bedrails?: A Little Help needed moving from lying on your back to sitting on the side of a flat bed without using bedrails?: A Little Help needed moving to and from a bed to a chair (including a wheelchair)?: A Little Help needed standing up from a chair using your arms (e.g., wheelchair or bedside chair)?: A Little Help needed to walk in hospital room?: A Little Help needed climbing 3-5 steps with a  railing? : A Little 6 Click Score: 18    End of Session Equipment Utilized During Treatment: Gait belt Activity Tolerance: Patient limited by pain Patient left: in bed;with bed alarm set;with call bell/phone within reach;with family/visitor present(SCDs not reapplied - pt reporting peripheral neuropathy is exacerbated by this, just got over a "flare up" prior to PT arrival. RN notified.) Nurse Communication: Mobility status;Patient requests pain meds PT Visit Diagnosis: Other abnormalities of gait and mobility (R26.89);Difficulty in walking, not elsewhere classified (R26.2)    Time: 6644-0347 PT Time Calculation (min) (ACUTE ONLY): 30 min   Charges:   PT Evaluation $PT Eval Low Complexity: 1 Low PT Treatments $Gait Training: 8-22 mins        Julien Girt, PT Acute Rehabilitation Services Pager [phone]  Office [phone]   Deryk Bozman D Elonda Husky 05/02/2018, 7:17 PM

## 2018-05-02 NOTE — Anesthesia Preprocedure Evaluation (Addendum)
Anesthesia Evaluation  Patient identified by MRN, date of birth, ID band Patient awake    Reviewed: Allergy & Precautions, NPO status , Patient's Chart, lab work & pertinent test results  Airway Mallampati: III  TM Distance: >3 FB Neck ROM: Full    Dental no notable dental hx. (+) Teeth Intact, Dental Advisory Given   Pulmonary neg pulmonary ROS,    Pulmonary exam normal breath sounds clear to auscultation       Cardiovascular hypertension, negative cardio ROS Normal cardiovascular exam Rhythm:Regular Rate:Normal     Neuro/Psych negative neurological ROS  negative psych ROS   GI/Hepatic Neg liver ROS, GERD  ,  Endo/Other  negative endocrine ROS  Renal/GU negative Renal ROS  negative genitourinary   Musculoskeletal  (+) Arthritis , Osteoarthritis,    Abdominal   Peds  Hematology negative hematology ROS (+)   Anesthesia Other Findings   Reproductive/Obstetrics                            Anesthesia Physical Anesthesia Plan  ASA: II  Anesthesia Plan: Spinal and Regional   Post-op Pain Management:  Regional for Post-op pain   Induction:   PONV Risk Score and Plan: 2 and Treatment may vary due to age or medical condition and Propofol infusion  Airway Management Planned: Natural Airway  Additional Equipment:   Intra-op Plan:   Post-operative Plan:   Informed Consent: I have reviewed the patients History and Physical, chart, labs and discussed the procedure including the risks, benefits and alternatives for the proposed anesthesia with the patient or authorized representative who has indicated his/her understanding and acceptance.   Dental advisory given  Plan Discussed with: CRNA  Anesthesia Plan Comments:         Anesthesia Quick Evaluation

## 2018-05-02 NOTE — Anesthesia Procedure Notes (Signed)
Anesthesia Regional Block: Adductor canal block   Pre-Anesthetic Checklist: ,, timeout performed, Correct Patient, Correct Site, Correct Laterality, Correct Procedure, Correct Position, site marked, Risks and benefits discussed,  Surgical consent,  Pre-op evaluation,  At surgeon's request and post-op pain management  Laterality: Right  Prep: Maximum Sterile Barrier Precautions used, chloraprep       Needles:  Injection technique: Single-shot  Needle Type: Echogenic Stimulator Needle     Needle Length: 9cm  Needle Gauge: 22     Additional Needles:   Procedures:,,,, ultrasound used (permanent image in chart),,,,  Narrative:  Start time: 05/02/2018 9:11 AM End time: 05/02/2018 9:21 AM Injection made incrementally with aspirations every 5 mL.  Performed by: Personally  Anesthesiologist: Freddrick March, MD  Additional Notes: Monitors applied. No increased pain on injection. No increased resistance to injection. Injection made in 5cc increments. Good needle visualization. Patient tolerated procedure well.

## 2018-05-02 NOTE — H&P (Signed)
Katherine [name] MRN:  [id number] DOB/SEX:  17-Sep-1950/female  CHIEF COMPLAINT:  Painful right Knee  HISTORY: Patient is a 67 y.o. female presented with a history of pain in the right knee. Onset of symptoms was gradual starting a few months ago with gradually worsening course since that time. Patient has been treated conservatively with over-the-counter NSAIDs and activity modification. Patient currently rates pain in the knee at 8 out of 10 with activity. There is pain at night.  PAST MEDICAL HISTORY: Patient Active Problem List   Diagnosis Date Noted  . Body mass index 40.0-44.9, adult ([place]) 10/22/2017  . Closed displaced oblique fracture of shaft of ulna with malunion, subsequent encounter 07/28/2017  . 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 Pain in right wrist 06/01/2017  . Trigger finger, right index finger 09/28/2016  . Impaired glucose tolerance 09/09/2015  . Hyperparathyroidism ([place]) 05/26/2013  . Hereditary and idiopathic peripheral neuropathy 12/29/2012  . Morbid obesity ([place])   . Menopausal symptoms 11/10/2011  . DIVERTICULOSIS, COLON 11/02/2007  . Dyslipidemia 03/09/2006  . Essential hypertension 03/09/2006  . NEPHROLITHIASIS, HX OF 03/09/2006   Past Medical History:  Diagnosis Date  . DDD (degenerative disc disease), lumbar 2012  . Dental crowns present   . GERD (gastroesophageal reflux disease)    occ, no medications at this time  . History of anemia 1991   not since hysterectomy  . History of gross hematuria 2002   with kidney stones  . History of hyperparathyroidism   . History of kidney stones   . Hypertension    states under control with meds., has been on med. x 20 yr.  . Impingement syndrome of right shoulder 2017  . Lymphedema of both lower extremities    due to neuropathy  . Lymphedema of both upper extremities    due to neuropathy  . Obese   . Osteoarthritis   . Peripheral neuropathy    hands and  feet  . Pre-diabetes   . Sciatica   . Triangular fibrocartilage complex tear 06/2017   right  . 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 Wears partial dentures    upper and lower   Past Surgical History:  Procedure Laterality Date  . ABDOMINAL HYSTERECTOMY  1991   partial  . CARPAL TUNNEL RELEASE Right 08/09/2002  . CARPAL TUNNEL RELEASE Left 11/03/2006  . COLONOSCOPY  01/2016  . COLONOSCOPY WITH PROPOFOL  11/24/2011  . CYSTOSCOPY WITH RETROGRADE PYELOGRAM, URETEROSCOPY AND STENT PLACEMENT Left 12/18/2014   Procedure: CYSTOSCOPY WITH RETROGRADE PYELOGRAM, URETEROSCOPY AND STENT PLACEMENT left ureter;  Surgeon: Raynelle Bring, MD;  Location: [facility];  Service: Urology;  Laterality: Left;  . DILATION AND CURETTAGE OF UTERUS    . ELBOW ARTHROSCOPY Right   . EXTRACORPOREAL SHOCK WAVE LITHOTRIPSY Left 03/08/2017   Procedure: LEFT EXTRACORPOREAL SHOCK WAVE LITHOTRIPSY (ESWL);  Surgeon: Raynelle Bring, MD;  Location: [facility];  Service: Urology;  Laterality: Left;  . HOLMIUM LASER APPLICATION Left 7/0/0174   Procedure: HOLMIUM LASER APPLICATION left ureter ;  Surgeon: Raynelle Bring, MD;  Location: [facility];  Service: Urology;  Laterality: Left;  . PARATHYROIDECTOMY Right 05/03/2015   Procedure: RIGHT SUPERIOR PARATHYROIDECTOMY;  Surgeon: Armandina Gemma, MD;  Location: [facility];  Service: General;  Laterality: Right;  . PATELLA REALIGNMENT Bilateral    as a teenager  . ROTATOR CUFF REPAIR Right    x2  . SHOULDER ARTHROSCOPY Right    x 2  . SHOULDER ARTHROSCOPY W/  ROTATOR CUFF REPAIR Left 04/26/2002  . TONSILLECTOMY    . TOTAL KNEE ARTHROPLASTY Left 09/10/2003  . TOTAL KNEE ARTHROPLASTY Right 11/10/2004  . TRIGGER FINGER RELEASE Right 08/06/2008   ring finger  . TRIGGER FINGER RELEASE     multiple - right thumb, left index/long/ring/thumb  . ULNA OSTEOTOMY Right 07/28/2017   Procedure: REVISION RIGHT ULNAR SHORTENING OSTEOTOMY;  Surgeon: Leandrew Koyanagi, MD;  Location: [facility];   Service: Orthopedics;  Laterality: Right;  . WRIST ARTHROSCOPY Left 06/16/2000  . WRIST ARTHROSCOPY WITH ULNA SHORTENING Right 07/07/2017   Procedure: RIGHT WRIST ARTHROSCOPY WITH ULNAR SHORTENING OSTEOTOMY;  Surgeon: Leandrew Koyanagi, MD;  Location: [facility];  Service: Orthopedics;  Laterality: Right;     MEDICATIONS:   Medications Prior to Admission  Medication Sig Dispense Refill Last Dose  . b complex vitamins tablet Take 1 tablet by mouth daily.   05/01/2018 at Unknown time  . Cholecalciferol (VITAMIN D) 50 MCG (2000 UT) tablet Take 2,000 Units by mouth daily.    05/01/2018 at Unknown time  . cycloSPORINE (RESTASIS) 0.05 % ophthalmic emulsion Place 1 drop into both eyes 2 (two) times daily.   05/01/2018 at Unknown time  . hydrochlorothiazide (HYDRODIURIL) 12.5 MG tablet Take 12.5 mg by mouth daily.   04/18/2018  . L-Methylfolate-Algae-B12-B6 (METANX) 3-90.314-2-35 MG CAPS Take by mouth 2 (two) times daily. (Patient taking differently: Take 1 capsule by mouth 2 (two) times daily. Take by mouth 2 (two) times daily.) 180 capsule 0 05/01/2018 at Unknown time  . lidocaine-prilocaine (EMLA) cream APPLY FOUR TIMES PER DAY AS NEEDED. (Patient taking differently: Apply 1 application topically 3 (three) times daily as needed (neuropathy). Apply four times per day as needed.) 270 g 3 05/02/2018 at Unknown time  . lisinopril (PRINIVIL,ZESTRIL) 10 MG tablet TAKE 1 TABLET BY MOUTH  DAILY (Patient taking differently: Take 10 mg by mouth daily. ) 90 tablet 1 05/01/2018 at Unknown time  . Multiple Vitamin (MULTIVITAMIN) tablet Take 1 tablet by mouth daily.   04/27/2018  . nortriptyline (PAMELOR) 25 MG capsule TAKE 3 CAPSULES BY MOUTH AT BEDTIME (Patient taking differently: Take 75 mg by mouth at bedtime. ) 270 capsule 4 05/01/2018 at Unknown time  . pregabalin (LYRICA) 100 MG capsule Take 1 capsule (100 mg total) by mouth 2 (two) times daily. 180 capsule 2 05/02/2018 at 0545  . calcium-vitamin D  (OSCAL WITH D) 500-200 MG-UNIT tablet Take 1 tablet by mouth 3 (three) times daily. (Patient not taking: Reported on 04/19/2018) 90 tablet 12 Not Taking at Unknown time  . traMADol (ULTRAM) 50 MG tablet Take one to two tablets by mouth every 6 hours if needed for pain, Prescribed by Neurology (Patient taking differently: Take 50-100 mg by mouth every 6 (six) hours as needed for moderate pain. Take one to two tablets by mouth every 6 hours if needed for pain, Prescribed by Neurology) 180 tablet 4 More than a month at Unknown time    ALLERGIES:   Allergies  Allergen Reactions  . Erythromycin Other (See Comments)    CHEST PAIN    REVIEW OF SYSTEMS:  A comprehensive review of systems was negative except for: Musculoskeletal: positive for arthralgias and bone pain   FAMILY HISTORY:   Family History  Problem Relation Age of Onset  . Stroke Mother   . Cancer Mother 62       bone cancer  . Diabetes Sister     SOCIAL HISTORY:   Social History  Tobacco Use  . Smoking status: Never Smoker  . Smokeless tobacco: Never Used  Substance Use Topics  . Alcohol use: Not Currently     EXAMINATION:  Vital signs in last 24 hours: Temp:  [97.8 F (36.6 C)] 97.8 F (36.6 C) (12/23 0659) Pulse Rate:  [100-111] 100 (12/23 0743) Resp:  [13] 13 (12/23 0659) BP: (143)/(95) 143/95 (12/23 0659) SpO2:  [99 %] 99 % (12/23 0659) Weight:  [115.7 kg] 115.7 kg (12/23 0735)  BP (!) 143/95   Pulse 100   Temp 97.8 F (36.6 C) (Oral)   Resp 13   Ht 5' 4.5" (1.638 m)   Wt 115.7 kg   SpO2 99%   BMI 43.09 kg/m   General Appearance:    Alert, cooperative, no distress, appears stated age  Head:    Normocephalic, without obvious abnormality, atraumatic  Eyes:    PERRL, conjunctiva/corneas clear, EOM's intact, fundi    benign, both eyes  Ears:    Normal TM's and external ear canals, both ears  Nose:   Nares normal, septum midline, mucosa normal, no drainage    or sinus tenderness  Throat:   Lips,  mucosa, and tongue normal; teeth and gums normal  Neck:   Supple, symmetrical, trachea midline, no adenopathy;    thyroid:  no enlargement/tenderness/nodules; no carotid   bruit or JVD  Back:     Symmetric, no curvature, ROM normal, no CVA tenderness  Lungs:     Clear to auscultation bilaterally, respirations unlabored  Chest Wall:    No tenderness or deformity   Heart:    Regular rate and rhythm, S1 and S2 normal, no murmur, rub   or gallop  Breast Exam:    No tenderness, masses, or nipple abnormality  Abdomen:     Soft, non-tender, bowel sounds active all four quadrants,    no masses, no organomegaly  Genitalia:    Normal female without lesion, discharge or tenderness  Rectal:    Normal tone, no masses or tenderness;   guaiac negative stool  Extremities:   Extremities normal, atraumatic, no cyanosis or edema  Pulses:   2+ and symmetric all extremities  Skin:   Skin color, texture, turgor normal, no rashes or lesions  Lymph nodes:   Cervical, supraclavicular, and axillary nodes normal  Neurologic:   CNII-XII intact, normal strength, sensation and reflexes    throughout    Musculoskeletal:  ROM 0-120, Ligaments intact,  Imaging Review Plain radiographs demonstrate s/p TKA right knee. The overall alignment is neutral. The bone quality appears to be good for age and reported activity level.  Assessment/Plan: S/p TKA , patellar clunk, poly wear  The patient history, physical examination and imaging studies are consistent with s/p TKA of the right knee. The patient has failed conservative treatment.  The clearance notes were reviewed.  After discussion with the patient it was felt that poly swap vs TKR was indicated. The procedure,  risks, and benefits of the procedure were presented and reviewed. The risks including but not limited to aseptic loosening, infection, blood clots, vascular injury, stiffness, patella tracking problems complications among others were discussed. The patient  acknowledged the explanation, agreed to proceed with the plan.  Preoperative templating of the joint replacement has been completed, documented, and submitted to the Operating Room personnel in order to optimize intra-operative equipment management.    Patient's anticipated LOS is less than 2 midnights, meeting these requirements: - Lives within 1 hour of care - Has a competent  adult at home to recover with post-op recover - NO history of  - Chronic pain requiring opiods  - Diabetes  - Coronary Artery Disease  - Heart failure  - Heart attack  - Stroke  - DVT/VTE  - Cardiac arrhythmia  - Respiratory Failure/COPD  - Renal failure  - Anemia  - Advanced Liver disease       Donia Ast 05/02/2018, 9:10 AM

## 2018-05-02 NOTE — Anesthesia Procedure Notes (Signed)
Spinal  Patient location during procedure: OR Start time: 05/02/2018 9:45 AM End time: 05/02/2018 9:53 AM Staffing Resident/CRNA: Ofilia Neas, CRNA Performed: resident/CRNA  Preanesthetic Checklist Completed: patient identified, site marked, surgical consent, pre-op evaluation, timeout performed, IV checked, risks and benefits discussed and monitors and equipment checked Spinal Block Patient position: sitting Prep: site prepped and draped, DuraPrep and alcohol swabs Patient monitoring: heart rate, blood pressure and continuous pulse ox Approach: midline Location: L3-4 Injection technique: single-shot Needle Needle type: Spinocan  Needle gauge: 22 G Needle length: 9 cm Additional Notes Kit expiration date checked.  Negative heme, paresthesia.  Tolerated well.

## 2018-05-03 DIAGNOSIS — M65861 Other synovitis and tenosynovitis, right lower leg: Secondary | ICD-10-CM | POA: Diagnosis present

## 2018-05-03 DIAGNOSIS — Z808 Family history of malignant neoplasm of other organs or systems: Secondary | ICD-10-CM | POA: Diagnosis not present

## 2018-05-03 DIAGNOSIS — I10 Essential (primary) hypertension: Secondary | ICD-10-CM | POA: Diagnosis present

## 2018-05-03 DIAGNOSIS — T84032A Mechanical loosening of internal right knee prosthetic joint, initial encounter: Secondary | ICD-10-CM | POA: Diagnosis present

## 2018-05-03 DIAGNOSIS — M5136 Other intervertebral disc degeneration, lumbar region: Secondary | ICD-10-CM | POA: Diagnosis present

## 2018-05-03 DIAGNOSIS — Z90710 Acquired absence of both cervix and uterus: Secondary | ICD-10-CM | POA: Diagnosis not present

## 2018-05-03 DIAGNOSIS — Z833 Family history of diabetes mellitus: Secondary | ICD-10-CM | POA: Diagnosis not present

## 2018-05-03 DIAGNOSIS — Y792 Prosthetic and other implants, materials and accessory orthopedic devices associated with adverse incidents: Secondary | ICD-10-CM | POA: Diagnosis present

## 2018-05-03 DIAGNOSIS — Z79891 Long term (current) use of opiate analgesic: Secondary | ICD-10-CM | POA: Diagnosis not present

## 2018-05-03 DIAGNOSIS — Z79899 Other long term (current) drug therapy: Secondary | ICD-10-CM | POA: Diagnosis not present

## 2018-05-03 DIAGNOSIS — Z96653 Presence of artificial knee joint, bilateral: Secondary | ICD-10-CM | POA: Diagnosis present

## 2018-05-03 DIAGNOSIS — M19031 Primary osteoarthritis, right wrist: Secondary | ICD-10-CM | POA: Diagnosis present

## 2018-05-03 DIAGNOSIS — E785 Hyperlipidemia, unspecified: Secondary | ICD-10-CM | POA: Diagnosis present

## 2018-05-03 DIAGNOSIS — Z823 Family history of stroke: Secondary | ICD-10-CM | POA: Diagnosis not present

## 2018-05-03 DIAGNOSIS — Z87442 Personal history of urinary calculi: Secondary | ICD-10-CM | POA: Diagnosis not present

## 2018-05-03 DIAGNOSIS — T84033A Mechanical loosening of internal left knee prosthetic joint, initial encounter: Secondary | ICD-10-CM | POA: Diagnosis present

## 2018-05-03 DIAGNOSIS — R7303 Prediabetes: Secondary | ICD-10-CM | POA: Diagnosis present

## 2018-05-03 DIAGNOSIS — E213 Hyperparathyroidism, unspecified: Secondary | ICD-10-CM | POA: Diagnosis present

## 2018-05-03 DIAGNOSIS — Z6841 Body Mass Index (BMI) 40.0 and over, adult: Secondary | ICD-10-CM | POA: Diagnosis not present

## 2018-05-03 DIAGNOSIS — K219 Gastro-esophageal reflux disease without esophagitis: Secondary | ICD-10-CM | POA: Diagnosis present

## 2018-05-03 DIAGNOSIS — Z881 Allergy status to other antibiotic agents status: Secondary | ICD-10-CM | POA: Diagnosis not present

## 2018-05-03 LAB — BASIC METABOLIC PANEL
Anion gap: 8 (ref 5–15)
BUN: 13 mg/dL (ref 8–23)
CO2: 21 mmol/L — ABNORMAL LOW (ref 22–32)
Calcium: 8.8 mg/dL — ABNORMAL LOW (ref 8.9–10.3)
Chloride: 107 mmol/L (ref 98–111)
Creatinine, Ser: 0.74 mg/dL (ref 0.44–1.00)
GFR calc Af Amer: >60 mL/min (ref >60)
GFR calc non Af Amer: >60 mL/min (ref >60)
Glucose, Bld: 110 mg/dL — ABNORMAL HIGH (ref 70–99)
Potassium: 4.4 mmol/L (ref 3.5–5.1)
Sodium: 136 mmol/L (ref 135–145)

## 2018-05-03 LAB — CBC
HCT: 35.9 % — ABNORMAL LOW (ref 36.0–46.0)
Hemoglobin: 11.4 g/dL — ABNORMAL LOW (ref 12.0–15.0)
MCH: 29.7 pg (ref 26.0–34.0)
MCHC: 31.8 g/dL (ref 30.0–36.0)
MCV: 93.5 fL (ref 80.0–100.0)
NRBC: 0 % (ref 0.0–0.2)
Platelets: 177 10*3/uL (ref 150–400)
RBC: 3.84 MIL/uL — ABNORMAL LOW (ref 3.87–5.11)
RDW: 14.7 % (ref 11.5–15.5)
WBC: 5.5 10*3/uL (ref 4.0–10.5)

## 2018-05-03 MED ORDER — ASPIRIN 325 MG PO TBEC: 325.0000 mg | DELAYED_RELEASE_TABLET | Freq: Two times a day (BID) | ORAL | 0 refills | Status: DC

## 2018-05-03 MED ORDER — LIDOCAINE-PRILOCAINE 2.5-2.5 % EX CREA
TOPICAL_CREAM | Freq: Three times a day (TID) | CUTANEOUS | Status: DC | PRN
  Filled 2018-05-03: qty 5

## 2018-05-03 MED ORDER — ACETAMINOPHEN 500 MG PO TABS: 1000.0000 mg | ORAL_TABLET | Freq: Four times a day (QID) | ORAL | 0 refills | Status: DC

## 2018-05-03 MED ORDER — METHOCARBAMOL 500 MG PO TABS: 500.0000 mg | ORAL_TABLET | Freq: Four times a day (QID) | ORAL | 0 refills | Status: DC | PRN

## 2018-05-03 MED ORDER — OXYCODONE HCL 5 MG PO TABS: 5.0000 mg | ORAL_TABLET | Freq: Four times a day (QID) | ORAL | 0 refills | Status: DC | PRN

## 2018-05-03 NOTE — Care Management Note (Signed)
Case Management Note  Patient Details  Name: Katherine [name] MRN: [id number] Date of Birth: [birth year]  Subjective/Objective:                    Action/Plan:   Expected Discharge Date:  05/03/18               Expected Discharge Plan:  Skilled Nursing Facility  In-House Referral:  Clinical Social Work  Discharge planning Services  CM Consult  Post Acute Care Choice:    Choice offered to:     DME Arranged:    DME Agency:     HH Arranged:    [place] Agency:     Status of Service:  Completed, signed off  If discussed at [employer], dates discussed:    Additional CommentsPurcell Mouton, RN 05/03/2018, 11:54 AM

## 2018-05-03 NOTE — Progress Notes (Signed)
Physical Therapy Treatment Patient Details Name: Katherine [name] MRN: [id number] DOB: Dec 02, 1950 Today's Date: 05/03/2018    History of Present Illness 67 yo female s/p R knee poly-exchange (previous R TKR) on 05/02/18. 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    PT Comments    Pt ambulated in hallway, practiced one step and performed LE exercises.  Pt provided with HEP handout.  Pt feels ready for d/c home.  Follow Up Recommendations  Follow surgeon's recommendation for DC plan and follow-up therapies     Equipment Recommendations  None recommended by PT    Recommendations for Other Services       Precautions / Restrictions Precautions Precautions: Fall;Knee Restrictions Other Position/Activity Restrictions: WBAT     Mobility  Bed Mobility Overal bed mobility: Needs Assistance Bed Mobility: Supine to Sit     Supine to sit: Supervision;HOB elevated        Transfers Overall transfer level: Needs assistance Equipment used: Rolling walker (2 wheeled) Transfers: Sit to/from Stand Sit to Stand: Supervision         General transfer comment: verbal cues for UE and LE positioing  Ambulation/Gait Ambulation/Gait assistance: Min guard;Supervision Gait Distance (Feet): 200 Feet Assistive device: Rolling walker (2 wheeled) Gait Pattern/deviations: Step-to pattern;Decreased stance time - right;Antalgic     General Gait Details: verbal cues for sequence, step length, heel strike   Stairs Stairs: Yes Stairs assistance: Min guard Stair Management: Step to pattern;With walker Number of Stairs: 1 General stair comments: verbal cues for RW positioning, safety, sequencing; pt reports understanding   Wheelchair Mobility    Modified Rankin (Stroke Patients Only)       Balance                                             Cognition Arousal/Alertness: Awake/alert Behavior During Therapy: WFL for tasks assessed/performed Overall Cognitive Status: Within Functional Limits for tasks assessed                                        Exercises Total Joint Exercises Ankle Circles/Pumps: AROM;Both;10 reps Quad Sets: AROM;Both;10 reps Short Arc Quad: AROM;10 reps;Right Heel Slides: AROM;10 reps;Right;Seated;Supine Hip ABduction/ADduction: AROM;10 reps;Right    General Comments        Pertinent Vitals/Pain Pain Assessment: 0-10 Pain Score: 5  Pain Location: R knee  Pain Descriptors / Indicators: Sore Pain Intervention(s): Limited activity within patient's tolerance;Monitored during session;Repositioned;Ice applied    Home Living                      Prior Function            PT Goals (current goals can now be found in the care plan section) Progress towards PT goals: Progressing toward goals    Frequency    7X/week      PT Plan Current plan remains appropriate    Co-evaluation              AM-PAC PT "6 Clicks" Mobility   Outcome Measure  Help needed turning from your back to your side while in a flat bed without using bedrails?: A Little Help needed  moving from lying on your back to sitting on the side of a flat bed without using bedrails?: A Little Help needed moving to and from a bed to a chair (including a wheelchair)?: A Little Help needed standing up from a chair using your arms (e.g., wheelchair or bedside chair)?: A Little Help needed to walk in hospital room?: A Little Help needed climbing 3-5 steps with a railing? : A Little 6 Click Score: 18    End of Session Equipment Utilized During Treatment: Gait belt Activity Tolerance: Patient tolerated treatment well Patient left: in chair;with call bell/phone within reach   PT Visit Diagnosis: Other abnormalities of gait and mobility (R26.89);Difficulty in walking, not  elsewhere classified (R26.2)     Time: 5638-7564 PT Time Calculation (min) (ACUTE ONLY): 28 min  Charges:  $Gait Training: 8-22 mins $Therapeutic Exercise: 8-22 mins                     Carmelia Bake, PT, DPT Acute Rehabilitation Services Office: [phone] Pager: [phone]  Trena Platt 05/03/2018, 1:06 PM

## 2018-05-03 NOTE — Op Note (Signed)
TOTAL KNEE REPLACEMENT OPERATIVE NOTE:  05/02/2018  6:47 AM  PATIENT:  Katherine [name]  67 y.o. female  PRE-OPERATIVE DIAGNOSIS:  RT. KNEE FAILED/LOOSENING TKA  POST-OPERATIVE DIAGNOSIS:  RT. KNEE FAILED/LOOSENING TKA  PROCEDURE:  Procedure(s): Right polyethelene exchange and open synovectomy  SURGEON:  Surgeon(s): Vickey Huger, MD  PHYSICIAN ASSISTANT: Carlyon Shadow, PA-C  ANESTHESIA:   spinal  SPECIMEN: None  COUNTS:  Correct  TOURNIQUET:   Total Tourniquet Time Documented: Thigh (Right) - 23 minutes Total: Thigh (Right) - 23 minutes   DICTATION:  Indication for procedure:    The patient is a 67 y.o. female who has failed conservative treatment for RT. KNEE FAILED/LOOSENING TKA.  Informed consent was obtained prior to anesthesia. The risks versus benefits of the operation were explain and in a way the patient can, and did, understand.   On the implant demand matching protocol, this patient scored 10.  Therefore, this patient was not receive a polyethylene insert with vitamin E which is a high demand implant.  Description of procedure:     The patient was taken to the operating room and placed under anesthesia.  The patient was positioned in the usual fashion taking care that all body parts were adequately padded and/or protected.  A tourniquet was applied and the leg prepped and draped in the usual sterile fashion.  The extremity was exsanguinated with the esmarch and tourniquet inflated to 350 mmHg.  Pre-operative range of motion was normal.  T  A midline incision approximately 6-7 inches long was made with a #10 blade.  A new blade was used to make a parapatellar arthrotomy going 2-3 cm into the quadriceps tendon, over the patella, and alongside the medial aspect of the patellar tendon.  A synovectomy was then performed with the #10 blade and forceps. I then elevated the deep MCL off the medial tibial metaphysis subperiosteally around to the semimembranosus  attachment.    I everted the patella and used calipers to measure patellar thickness. I evaluated all the components and they were well-fixed.  There was some poly synovitis and I did a complete synovectomy.  I flexed the knee and removed the poly.  It had mild to moderate post and medial sided wear.  I felt the knee has grown lax over time in addition to the poly wear.  I decided to go from a 2mm poly to a 51mm poly which offered excellent stability.   Flexion was full and beyond 120 degrees; extension was zero.  The 81mm polyethylene bearing was chosen and the staff opened them to me on the back table while the knee was lavaged copiously and the cement mixed.  The soft tissue was infiltrated with 60cc of exparel 1.3% through a 21 gauge needle. The capsule was infilltrated with a 60cc exparel/marcaine/saline mixture.   Once the cement was hard, the tourniquet was let down.  Hemostasis was obtained.  The arthrotomy was closed using a #1 stratofix running suture.  The deep soft tissues were closed with #0 vicryls and the subcuticular layer closed with #2-0 vicryl.  The skin was reapproximated and closed with 3.0 Monocryl.  The wound was covered with steristrips, aquacel dressing, and a TED stocking.   The patient was then awakened, extubated, and taken to the recovery room in stable condition.  BLOOD LOSS:  277OE COMPLICATIONS:  None.  PLAN OF CARE: Admit for overnight observation  PATIENT DISPOSITION:  PACU - hemodynamically stable.   Delay start of Pharmacological VTE agent (>24hrs) due to  surgical blood loss or risk of bleeding:  not applicable  Please fax a copy of this op note to my office at [phone] (please only include page 1 and 2 of the Case Information op note)

## 2018-05-03 NOTE — Progress Notes (Signed)
SPORTS MEDICINE AND JOINT REPLACEMENT  Lara Mulch, MD    Carlyon Shadow, PA-C [place], [place], [place]  [address]                             [phone]   PROGRESS NOTE  Subjective:  negative for Chest Pain  negative for Shortness of Breath  negative for Nausea/Vomiting   negative for Calf Pain  negative for Bowel Movement   Tolerating Diet: yes         Patient reports pain as 3 on 0-10 scale.    Objective: Vital signs in last 24 hours:    Patient Vitals for the past 24 hrs:  BP Temp Temp src Pulse Resp SpO2 Height Weight  05/03/18 0547 123/87 97.6 F (36.4 C) Oral 91 17 95 % - -  05/03/18 0145 120/75 98.2 F (36.8 C) Oral 98 18 98 % - -  05/02/18 2136 136/82 98.2 F (36.8 C) Oral (!) 102 18 97 % - -  05/02/18 1607 (!) 143/99 - - (!) 103 - 97 % - -  05/02/18 1458 (!) 138/98 97.7 F (36.5 C) Oral (!) 103 14 100 % - -  05/02/18 1445 (!) 149/100 97.8 F (36.6 C) - (!) 104 12 100 % - -  05/02/18 1430 (!) 185/108 - - (!) 104 14 100 % - -  05/02/18 1415 (!) 167/114 - - (!) 106 18 98 % - -  05/02/18 1400 (!) 170/116 - - (!) 114 16 95 % - -  05/02/18 1345 (!) 178/106 - - (!) 107 17 93 % - -  05/02/18 1330 (!) 149/112 - - (!) 105 19 94 % - -  05/02/18 1315 (!) 186/104 97.6 F (36.4 C) - (!) 105 16 98 % - -  05/02/18 1245 - - - 100 18 95 % - -  05/02/18 1230 (!) 167/107 - - 90 16 93 % - -  05/02/18 1215 (!) 152/110 (!) 97.5 F (36.4 C) - 86 16 95 % - -  05/02/18 1200 (!) 160/108 - - 86 18 94 % - -  05/02/18 1145 (!) 162/106 - - 89 17 96 % - -  05/02/18 1130 (!) 162/98 - - 91 18 100 % - -  05/02/18 1117 (!) 150/87 97.9 F (36.6 C) - 98 18 100 % - -  05/02/18 0930 (!) 159/93 - - (!) 103 (!) 21 97 % - -  05/02/18 0929 - - - (!) 103 (!) 23 96 % - -  05/02/18 0928 - - - (!) 102 (!) 21 97 % - -  05/02/18 0927 - - - (!) 101 (!) 21 97 % - -  05/02/18 0926 - - - (!) 102 20 97 % - -  05/02/18 0925 - - - 99 20 97 % - -  05/02/18 0924 (!) 156/93 - - 98 17 99 % - -   05/02/18 0923 - - - (!) 101 18 99 % - -  05/02/18 0922 - - - (!) 101 (!) 21 100 % - -  05/02/18 0921 - - - (!) 102 16 99 % - -  05/02/18 0920 - - - (!) 102 (!) 24 97 % - -  05/02/18 0919 (!) 161/89 - - (!) 102 19 98 % - -  05/02/18 0918 - - - (!) 102 (!) 22 99 % - -  05/02/18 0917 - - - 98 11  100 % - -  05/02/18 0916 - - - 99 17 99 % - -  05/02/18 0915 - - - 99 (!) 23 100 % - -  05/02/18 0914 (!) 134/100 - - 99 20 100 % - -  05/02/18 0743 - - - 100 - - - -  05/02/18 0735 - - - - - - 5' 4.5" (1.638 m) 115.7 kg    @flow {1959:LAST@   Intake/Output from previous day:   12/23 0701 - 12/24 0700 In: 3231.5 [P.O.:300; I.V.:2731.5] Out: 8299 [Urine:4375]   Intake/Output this shift:   No intake/output data recorded.   Intake/Output      12/23 0701 - 12/24 0700 12/24 0701 - 12/25 0700   P.O. 300    I.V. (mL/kg) 2731.5 (23.6)    IV Piggyback 200    Total Intake(mL/kg) 3231.5 (27.9)    Urine (mL/kg/hr) 4375 (1.6)    Blood 100    Total Output 4475    Net -1243.6            LABORATORY DATA: Recent Labs    04/26/18 0908 05/03/18 0523  WBC 4.1 5.5  HGB 12.7 11.4*  HCT 39.6 35.9*  PLT 173 177   Recent Labs    04/26/18 0908 05/03/18 0523  NA 138 136  K 4.9 4.4  CL 106 107  CO2 24 21*  BUN 12 13  CREATININE 0.99 0.74  GLUCOSE 151* 110*  CALCIUM 10.0 8.8*   No results found for: INR, PROTIME  Examination:  General appearance: alert, cooperative and no distress Extremities: extremities normal, atraumatic, no cyanosis or edema  Wound Exam: clean, dry, intact   Drainage:  None: wound tissue dry  Motor Exam: Quadriceps and Hamstrings Intact  Sensory Exam: Superficial Peroneal, Deep Peroneal and Tibial normal   Assessment:    1 Day Post-Op  Procedure(s) (LRB): Right polyethelene exchange and open synovectomy (Right)  ADDITIONAL DIAGNOSIS:  Active Problems:   S/P total knee replacement     Plan: Physical Therapy as ordered Weight Bearing as Tolerated  (WBAT)  DVT Prophylaxis:  Aspirin  DISCHARGE PLAN: Home  DISCHARGE NEEDS: HHPT       Patient's anticipated LOS is less than 2 midnights, meeting these requirements: - Lives within 1 hour of care - Has a competent adult at home to recover with post-op recover - NO history of  - Chronic pain requiring opiods  - Diabetes  - Coronary Artery Disease  - Heart failure  - Heart attack  - Stroke  - DVT/VTE  - Cardiac arrhythmia  - Respiratory Failure/COPD  - Renal failure  - Anemia  - Advanced Liver disease        Donia Ast 05/03/2018, 7:02 AM

## 2018-05-03 NOTE — Discharge Summary (Signed)
SPORTS MEDICINE & JOINT REPLACEMENT   Lara Mulch, MD   Carlyon Shadow, PA-C [address], [place], [place]  [address]                             [phone]  PATIENT ID: Katherine [name]        MRN:  [id number]          DOB/AGE: [birth year] / 67 y.o.    DISCHARGE SUMMARY  ADMISSION DATE:    05/02/2018 DISCHARGE DATE:   05/03/2018   ADMISSION DIAGNOSIS: RT. KNEE FAILED LOOSENING TKA    DISCHARGE DIAGNOSIS:  RT. KNEE FAILED/LOOSENING TKA    ADDITIONAL DIAGNOSIS: Active Problems:   S/P total knee replacement  Past Medical History:  Diagnosis Date  . DDD (degenerative disc disease), lumbar 2012  . Dental crowns present   . GERD (gastroesophageal reflux disease)    occ, no medications at this time  . History of anemia 1991   not since hysterectomy  . History of gross hematuria 2002   with kidney stones  . History of hyperparathyroidism   . History of kidney stones   . Hypertension    states under control with meds., has been on med. x 20 yr.  . Impingement syndrome of right shoulder 2017  . Lymphedema of both lower extremities    due to neuropathy  . Lymphedema of both upper extremities    due to neuropathy  . Obese   . Osteoarthritis   . Peripheral neuropathy    hands and feet  . Pre-diabetes   . Sciatica   . Triangular fibrocartilage complex tear 06/2017   right  . 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 Wears partial dentures    upper and lower    PROCEDURE: Procedure(s): Right polyethelene exchange and open synovectomy on 05/02/2018  CONSULTS:    HISTORY:  See H&P in chart  HOSPITAL COURSE:  Maryna Yeagle is a 67 y.o. admitted on 05/02/2018 and found to have a diagnosis of RT. KNEE FAILED/LOOSENING TKA.  After appropriate laboratory studies were obtained  they were taken to the operating room on 05/02/2018 and underwent Procedure(s): Right polyethelene exchange and open synovectomy.   They were given perioperative antibiotics:   Anti-infectives (From admission, onward)   Start     Dose/Rate Route Frequency Ordered Stop   05/02/18 1500  ceFAZolin (ANCEF) IVPB 2g/100 mL premix     2 g 200 mL/hr over 30 Minutes Intravenous Every 6 hours 05/02/18 1454 05/02/18 2205   05/02/18 0715  ceFAZolin (ANCEF) IVPB 2g/100 mL premix     2 g 200 mL/hr over 30 Minutes Intravenous On call to O.R. 05/02/18 0630 05/02/18 0942    .  Patient given tranexamic acid IV or topical and exparel intra-operatively.  Tolerated the procedure well.    POD# 1: Vital signs were stable.  Patient denied Chest pain, shortness of breath, or calf pain.  Patient was started on Aspirin twice daily at 8am.  Consults to PT, OT, and care management were made.  The patient was weight bearing as tolerated.  CPM was placed on the operative leg 0-90 degrees for 6-8 hours a day. When out of the CPM, patient was placed in the foam block to achieve full extension. Incentive spirometry was taught.  Dressing was changed.       POD #2, Continued  PT for ambulation and exercise program.  IV saline locked.  O2 discontinued.  The remainder of the hospital course was dedicated to ambulation and strengthening.   The patient was discharged on 1 Day Post-Op in  Good condition.  Blood products given:none  DIAGNOSTIC STUDIES: Recent vital signs:  Patient Vitals for the past 24 hrs:  BP Temp Temp src Pulse Resp SpO2 Height Weight  05/03/18 0547 123/87 97.6 F (36.4 C) Oral 91 17 95 % - -  05/03/18 0145 120/75 98.2 F (36.8 C) Oral 98 18 98 % - -  05/02/18 2136 136/82 98.2 F (36.8 C) Oral (!) 102 18 97 % - -  05/02/18 1607 (!) 143/99 - - (!) 103 - 97 % - -  05/02/18 1458 (!) 138/98 97.7 F (36.5 C) Oral (!) 103 14 100 % - -  05/02/18 1445 (!) 149/100 97.8 F (36.6 C) - (!) 104 12 100 % - -  05/02/18 1430 (!) 185/108 - - (!) 104 14 100 % - -  05/02/18 1415 (!) 167/114 - - (!) 106 18 98 % - -  05/02/18 1400 (!) 170/116 - - (!) 114 16 95 % - -  05/02/18 1345  (!) 178/106 - - (!) 107 17 93 % - -  05/02/18 1330 (!) 149/112 - - (!) 105 19 94 % - -  05/02/18 1315 (!) 186/104 97.6 F (36.4 C) - (!) 105 16 98 % - -  05/02/18 1245 - - - 100 18 95 % - -  05/02/18 1230 (!) 167/107 - - 90 16 93 % - -  05/02/18 1215 (!) 152/110 (!) 97.5 F (36.4 C) - 86 16 95 % - -  05/02/18 1200 (!) 160/108 - - 86 18 94 % - -  05/02/18 1145 (!) 162/106 - - 89 17 96 % - -  05/02/18 1130 (!) 162/98 - - 91 18 100 % - -  05/02/18 1117 (!) 150/87 97.9 F (36.6 C) - 98 18 100 % - -  05/02/18 0930 (!) 159/93 - - (!) 103 (!) 21 97 % - -  05/02/18 0929 - - - (!) 103 (!) 23 96 % - -  05/02/18 0928 - - - (!) 102 (!) 21 97 % - -  05/02/18 0927 - - - (!) 101 (!) 21 97 % - -  05/02/18 0926 - - - (!) 102 20 97 % - -  05/02/18 0925 - - - 99 20 97 % - -  05/02/18 0924 (!) 156/93 - - 98 17 99 % - -  05/02/18 0923 - - - (!) 101 18 99 % - -  05/02/18 0922 - - - (!) 101 (!) 21 100 % - -  05/02/18 0921 - - - (!) 102 16 99 % - -  05/02/18 0920 - - - (!) 102 (!) 24 97 % - -  05/02/18 0919 (!) 161/89 - - (!) 102 19 98 % - -  05/02/18 0918 - - - (!) 102 (!) 22 99 % - -  05/02/18 0917 - - - 98 11 100 % - -  05/02/18 0916 - - - 99 17 99 % - -  05/02/18 0915 - - - 99 (!) 23 100 % - -  05/02/18 0914 (!) 134/100 - - 99 20 100 % - -  05/02/18 0743 - - - 100 - - - -  05/02/18 0735 - - - - - - 5' 4.5" (1.638 m) 115.7 kg       Recent laboratory studies: Recent Labs  04/26/18 0908 05/03/18 0523  WBC 4.1 5.5  HGB 12.7 11.4*  HCT 39.6 35.9*  PLT 173 177   Recent Labs    04/26/18 0908 05/03/18 0523  NA 138 136  K 4.9 4.4  CL 106 107  CO2 24 21*  BUN 12 13  CREATININE 0.99 0.74  GLUCOSE 151* 110*  CALCIUM 10.0 8.8*   No results found for: INR, PROTIME   Recent Radiographic Studies :  No results found.  DISCHARGE INSTRUCTIONS: Discharge Instructions    Call MD / Call 911   Complete by:  As directed    If you experience chest pain or shortness of breath, CALL 911 and be  transported to the hospital emergency room.  If you develope a fever above 101 F, pus (white drainage) or increased drainage or redness at the wound, or calf pain, call your surgeon's office.   Constipation Prevention   Complete by:  As directed    Drink plenty of fluids.  Prune juice may be helpful.  You may use a stool softener, such as Colace (over the counter) 100 mg twice a day.  Use MiraLax (over the counter) for constipation as needed.   Diet - low sodium heart healthy   Complete by:  As directed    Discharge instructions   Complete by:  As directed    INSTRUCTIONS AFTER JOINT REPLACEMENT   Remove items at home which could result in a fall. This includes throw rugs or furniture in walking pathways ICE to the affected joint every three hours while awake for 30 minutes at a time, for at least the first 3-5 days, and then as needed for pain and swelling.  Continue to use ice for pain and swelling. You may notice swelling that will progress down to the foot and ankle.  This is normal after surgery.  Elevate your leg when you are not up walking on it.   Continue to use the breathing machine you got in the hospital (incentive spirometer) which will help keep your temperature down.  It is common for your temperature to cycle up and down following surgery, especially at night when you are not up moving around and exerting yourself.  The breathing machine keeps your lungs expanded and your temperature down.   DIET:  As you were doing prior to hospitalization, we recommend a well-balanced diet.  DRESSING / WOUND CARE / SHOWERING  Keep the surgical dressing until follow up.  The dressing is water proof, so you can shower without any extra covering.  IF THE DRESSING FALLS OFF or the wound gets wet inside, change the dressing with sterile gauze.  Please use good hand washing techniques before changing the dressing.  Do not use any lotions or creams on the incision until instructed by your surgeon.     ACTIVITY  Increase activity slowly as tolerated, but follow the weight bearing instructions below.   No driving for 6 weeks or until further direction given by your physician.  You cannot drive while taking narcotics.  No lifting or carrying greater than 10 lbs. until further directed by your surgeon. Avoid periods of inactivity such as sitting longer than an hour when not asleep. This helps prevent blood clots.  You may return to work once you are authorized by your doctor.     WEIGHT BEARING   Weight bearing as tolerated with assist device (walker, cane, etc) as directed, use it as long as suggested by your surgeon or therapist, typically at  least 4-6 weeks.   EXERCISES  Results after joint replacement surgery are often greatly improved when you follow the exercise, range of motion and muscle strengthening exercises prescribed by your doctor. Safety measures are also important to protect the joint from further injury. Any time any of these exercises cause you to have increased pain or swelling, decrease what you are doing until you are comfortable again and then slowly increase them. If you have problems or questions, call your caregiver or physical therapist for advice.   Rehabilitation is important following a joint replacement. After just a few days of immobilization, the muscles of the leg can become weakened and shrink (atrophy).  These exercises are designed to build up the tone and strength of the thigh and leg muscles and to improve motion. Often times heat used for twenty to thirty minutes before working out will loosen up your tissues and help with improving the range of motion but do not use heat for the first two weeks following surgery (sometimes heat can increase post-operative swelling).   These exercises can be done on a training (exercise) mat, on the floor, on a table or on a bed. Use whatever works the best and is most comfortable for you.    Use music or television  while you are exercising so that the exercises are a pleasant break in your day. This will make your life better with the exercises acting as a break in your routine that you can look forward to.   Perform all exercises about fifteen times, three times per day or as directed.  You should exercise both the operative leg and the other leg as well.   Exercises include:   Quad Sets - Tighten up the muscle on the front of the thigh (Quad) and hold for 5-10 seconds.   Straight Leg Raises - With your knee straight (if you were given a brace, keep it on), lift the leg to 60 degrees, hold for 3 seconds, and slowly lower the leg.  Perform this exercise against resistance later as your leg gets stronger.  Leg Slides: Lying on your back, slowly slide your foot toward your buttocks, bending your knee up off the floor (only go as far as is comfortable). Then slowly slide your foot back down until your leg is flat on the floor again.  Angel Wings: Lying on your back spread your legs to the side as far apart as you can without causing discomfort.  Hamstring Strength:  Lying on your back, push your heel against the floor with your leg straight by tightening up the muscles of your buttocks.  Repeat, but this time bend your knee to a comfortable angle, and push your heel against the floor.  You may put a pillow under the heel to make it more comfortable if necessary.   A rehabilitation program following joint replacement surgery can speed recovery and prevent re-injury in the future due to weakened muscles. Contact your doctor or a physical therapist for more information on knee rehabilitation.    CONSTIPATION  Constipation is defined medically as fewer than three stools per week and severe constipation as less than one stool per week.  Even if you have a regular bowel pattern at home, your normal regimen is likely to be disrupted due to multiple reasons following surgery.  Combination of anesthesia, postoperative  narcotics, change in appetite and fluid intake all can affect your bowels.   YOU MUST use at least one of the following options;  they are listed in order of increasing strength to get the job done.  They are all available over the counter, and you may need to use some, POSSIBLY even all of these options:    Drink plenty of fluids (prune juice may be helpful) and high fiber foods Colace 100 mg by mouth twice a day  Senokot for constipation as directed and as needed Dulcolax (bisacodyl), take with full glass of water  Miralax (polyethylene glycol) once or twice a day as needed.  If you have tried all these things and are unable to have a bowel movement in the first 3-4 days after surgery call either your surgeon or your primary doctor.    If you experience loose stools or diarrhea, hold the medications until you stool forms back up.  If your symptoms do not get better within 1 week or if they get worse, check with your doctor.  If you experience "the worst abdominal pain ever" or develop nausea or vomiting, please contact the office immediately for further recommendations for treatment.   ITCHING:  If you experience itching with your medications, try taking only a single pain pill, or even half a pain pill at a time.  You can also use Benadryl over the counter for itching or also to help with sleep.   TED HOSE STOCKINGS:  Use stockings on both legs until for at least 2 weeks or as directed by physician office. They may be removed at night for sleeping.  MEDICATIONS:  See your medication summary on the "After Visit Summary" that nursing will review with you.  You may have some home medications which will be placed on hold until you complete the course of blood thinner medication.  It is important for you to complete the blood thinner medication as prescribed.  PRECAUTIONS:  If you experience chest pain or shortness of breath - call 911 immediately for transfer to the hospital emergency department.    If you develop a fever greater that 101 F, purulent drainage from wound, increased redness or drainage from wound, foul odor from the wound/dressing, or calf pain - CONTACT YOUR SURGEON.                                                   FOLLOW-UP APPOINTMENTS:  If you do not already have a post-op appointment, please call the office for an appointment to be seen by your surgeon.  Guidelines for how soon to be seen are listed in your "After Visit Summary", but are typically between 1-4 weeks after surgery.  OTHER INSTRUCTIONS:   Knee Replacement:  Do not place pillow under knee, focus on keeping the knee straight while resting. CPM instructions: 0-90 degrees, 2 hours in the morning, 2 hours in the afternoon, and 2 hours in the evening. Place foam block, curve side up under heel at all times except when in CPM or when walking.  DO NOT modify, tear, cut, or change the foam block in any way.  MAKE SURE YOU:  Understand these instructions.  Get help right away if you are not doing well or get worse.    Thank you for letting us be a part of your medical care team.  It is a privilege we respect greatly.  We hope these instructions will help you stay on track for a fast and full  recovery!   Increase activity slowly as tolerated   Complete by:  As directed       DISCHARGE MEDICATIONS:   Allergies as of 05/03/2018      Reactions   Erythromycin Other (See Comments)   CHEST PAIN      Medication List    TAKE these medications   acetaminophen 500 MG tablet Commonly known as:  TYLENOL Take 2 tablets (1,000 mg total) by mouth every 6 (six) hours.   aspirin 325 MG EC tablet Take 1 tablet (325 mg total) by mouth 2 (two) times daily.   b complex vitamins tablet Take 1 tablet by mouth daily.   calcium-vitamin D 500-200 MG-UNIT tablet Commonly known as:  OSCAL WITH D Take 1 tablet by mouth 3 (three) times daily.   cycloSPORINE 0.05 % ophthalmic emulsion Commonly known as:  RESTASIS Place  1 drop into both eyes 2 (two) times daily.   hydrochlorothiazide 12.5 MG tablet Commonly known as:  HYDRODIURIL Take 12.5 mg by mouth daily.   lidocaine-prilocaine cream Commonly known as:  EMLA APPLY FOUR TIMES PER DAY AS NEEDED. What changed:  See the new instructions.   lisinopril 10 MG tablet Commonly known as:  PRINIVIL,ZESTRIL TAKE 1 TABLET BY MOUTH  DAILY   METANX 3-90.314-2-35 MG Caps Take by mouth 2 (two) times daily. What changed:    how much to take  how to take this  when to take this   methocarbamol 500 MG tablet Commonly known as:  ROBAXIN Take 1-2 tablets (500-1,000 mg total) by mouth every 6 (six) hours as needed for muscle spasms.   multivitamin tablet Take 1 tablet by mouth daily.   nortriptyline 25 MG capsule Commonly known as:  PAMELOR TAKE 3 CAPSULES BY MOUTH AT BEDTIME   oxyCODONE 5 MG immediate release tablet Commonly known as:  Oxy IR/ROXICODONE Take 1-2 tablets (5-10 mg total) by mouth every 6 (six) hours as needed for moderate pain (pain score 4-6).   pregabalin 100 MG capsule Commonly known as:  LYRICA Take 1 capsule (100 mg total) by mouth 2 (two) times daily.   traMADol 50 MG tablet Commonly known as:  ULTRAM Take one to two tablets by mouth every 6 hours if needed for pain, Prescribed by Neurology What changed:    how much to take  how to take this  when to take this  reasons to take this   Vitamin D 50 MCG (2000 UT) tablet Take 2,000 Units by mouth daily.            Durable Medical Equipment  (From admission, onward)         Start     Ordered   05/02/18 1455  DME Walker rolling  Once    Question:  Patient needs a walker to treat with the following condition  Answer:  S/P total knee replacement   05/02/18 1454   05/02/18 1455  DME 3 n 1  Once     05/02/18 1454   05/02/18 1455  DME Bedside commode  Once    Question:  Patient needs a bedside commode to treat with the following condition  Answer:  S/P total knee  replacement   05/02/18 1454          FOLLOW UP VISIT:    DISPOSITION: HOME VS. SNF  CONDITION:  Good   Donia Ast 05/03/2018, 7:06 AM

## 2018-05-03 NOTE — Progress Notes (Signed)
Pt was provided with d/c instructions. After discussing the pt's plan of care upon d/c home, the pt reported no further questions or concerns. The pt is currently waiting on her ride to arrive.

## 2018-05-03 NOTE — Plan of Care (Signed)
Plan of care reviewed with pt. Pt is stable.

## 2018-05-03 NOTE — Progress Notes (Signed)
Consult-SNF placement  Plan: [facility].  CSW signing off.   Kathrin Greathouse, Marlinda Mike, MSW Clinical Social Worker  [phone] 05/03/2018  8:17 AM

## 2018-05-05 ENCOUNTER — Encounter (HOSPITAL_COMMUNITY): Payer: Self-pay | Admitting: Orthopedic Surgery

## 2018-05-19 ENCOUNTER — Other Ambulatory Visit: Payer: Self-pay | Admitting: Family Medicine

## 2018-05-20 NOTE — Telephone Encounter (Signed)
Pt LOV was 03/01/2018 and last refill was done on 04/01/2018 for 180 tablets , please advise if ok to refill

## 2018-07-04 ENCOUNTER — Other Ambulatory Visit: Payer: Self-pay | Admitting: Internal Medicine

## 2018-07-11 ENCOUNTER — Telehealth: Payer: Self-pay

## 2018-07-11 NOTE — Telephone Encounter (Signed)
Author phoned pt. to notify that awv not due until after 02/10/2019. Pt. said she needed to see Dr. Volanda Napoleon anyway, largely to address neuropathic pain in thighs. CPE appointment made for 3/13. Appointment notes updated.  Copied from [place] [phone]. Topic: Appointment Scheduling - Scheduling Inquiry for Clinic >> Jul 11, 2018  9:40 AM Jeri Cos wrote: Reason for CRM: Pt is wanting to schedule her AWV.

## 2018-07-22 ENCOUNTER — Ambulatory Visit (INDEPENDENT_AMBULATORY_CARE_PROVIDER_SITE_OTHER): Payer: Medicare Other | Admitting: Family Medicine

## 2018-07-22 ENCOUNTER — Other Ambulatory Visit: Payer: Self-pay

## 2018-07-22 VITALS — BP 118/76 | HR 104 | Temp 98.5°F | Wt 243.0 lb

## 2018-07-22 DIAGNOSIS — Z0000 Encounter for general adult medical examination without abnormal findings: Secondary | ICD-10-CM | POA: Diagnosis not present

## 2018-07-22 DIAGNOSIS — E782 Mixed hyperlipidemia: Secondary | ICD-10-CM | POA: Diagnosis not present

## 2018-07-22 DIAGNOSIS — I10 Essential (primary) hypertension: Secondary | ICD-10-CM

## 2018-07-22 DIAGNOSIS — G629 Polyneuropathy, unspecified: Secondary | ICD-10-CM | POA: Diagnosis not present

## 2018-07-22 LAB — LIPID PANEL
Cholesterol: 198 mg/dL (ref 0–200)
HDL: 41.5 mg/dL (ref >39.00)
LDL Cholesterol: 128 mg/dL — ABNORMAL HIGH (ref 0–99)
NonHDL: 156.99
Total CHOL/HDL Ratio: 5
Triglycerides: 143 mg/dL (ref 0.0–149.0)
VLDL: 28.6 mg/dL (ref 0.0–40.0)

## 2018-07-22 LAB — BASIC METABOLIC PANEL
BUN: 14 mg/dL (ref 6–23)
CALCIUM: 9.7 mg/dL (ref 8.4–10.5)
CO2: 29 mEq/L (ref 19–32)
Chloride: 100 mEq/L (ref 96–112)
Creatinine, Ser: 0.93 mg/dL (ref 0.40–1.20)
GFR: 72.27 mL/min (ref >60.00)
Glucose, Bld: 103 mg/dL — ABNORMAL HIGH (ref 70–99)
Potassium: 4.2 mEq/L (ref 3.5–5.1)
Sodium: 137 mEq/L (ref 135–145)

## 2018-07-22 LAB — FOLATE: Folate: >24 ng/mL (ref >5.9)

## 2018-07-22 LAB — VITAMIN B12: Vitamin B-12: >1525 pg/mL — ABNORMAL HIGH (ref 211–911)

## 2018-07-22 MED ORDER — LIDOCAINE-PRILOCAINE 2.5-2.5 % EX CREA: 1.0000 "application " | TOPICAL_CREAM | Freq: Three times a day (TID) | CUTANEOUS | 5 refills | Status: DC | PRN

## 2018-07-22 NOTE — Patient Instructions (Signed)
Health Maintenance After Age 70 After age 70, you are at a higher risk for certain long-term diseases and infections as well as injuries from falls. Falls are a major cause of broken bones and head injuries in people who are older than age 70. Getting regular preventive care can help to keep you healthy and well. Preventive care includes getting regular testing and making lifestyle changes as recommended by your health care provider. Talk with your health care provider about:  Which screenings and tests you should have. A screening is a test that checks for a disease when you have no symptoms.  A diet and exercise plan that is right for you. What should I know about screenings and tests to prevent falls? Screening and testing are the best ways to find a health problem early. Early diagnosis and treatment give you the best chance of managing medical conditions that are common after age 70. Certain conditions and lifestyle choices may make you more likely to have a fall. Your health care provider may recommend:  Regular vision checks. Poor vision and conditions such as cataracts can make you more likely to have a fall. If you wear glasses, make sure to get your prescription updated if your vision changes.  Medicine review. Work with your health care provider to regularly review all of the medicines you are taking, including over-the-counter medicines. Ask your health care provider about any side effects that may make you more likely to have a fall. Tell your health care provider if any medicines that you take make you feel dizzy or sleepy.  Osteoporosis screening. Osteoporosis is a condition that causes the bones to get weaker. This can make the bones weak and cause them to break more easily.  Blood pressure screening. Blood pressure changes and medicines to control blood pressure can make you feel dizzy.  Strength and balance checks. Your health care provider may recommend certain tests to check your  strength and balance while standing, walking, or changing positions.  Foot health exam. Foot pain and numbness, as well as not wearing proper footwear, can make you more likely to have a fall.  Depression screening. You may be more likely to have a fall if you have a fear of falling, feel emotionally low, or feel unable to do activities that you used to do.  Alcohol use screening. Using too much alcohol can affect your balance and may make you more likely to have a fall. What actions can I take to lower my risk of falls? General instructions  Talk with your health care provider about your risks for falling. Tell your health care provider if: ? You fall. Be sure to tell your health care provider about all falls, even ones that seem minor. ? You feel dizzy, sleepy, or off-balance.  Take over-the-counter and prescription medicines only as told by your health care provider. These include any supplements.  Eat a healthy diet and maintain a healthy weight. A healthy diet includes low-fat dairy products, low-fat (lean) meats, and fiber from whole grains, beans, and lots of fruits and vegetables. Home safety  Remove any tripping hazards, such as rugs, cords, and clutter.  Install safety equipment such as grab bars in bathrooms and safety rails on stairs.  Keep rooms and walkways well-lit. Activity   Follow a regular exercise program to stay fit. This will help you maintain your balance. Ask your health care provider what types of exercise are appropriate for you.  If you need a cane or   walker, use it as recommended by your health care provider.  Wear supportive shoes that have nonskid soles. Lifestyle  Do not drink alcohol if your health care provider tells you not to drink.  If you drink alcohol, limit how much you have: ? 0-1 drink a day for women. ? 0-2 drinks a day for men.  Be aware of how much alcohol is in your drink. In the [place] one drink equals one typical bottle of beer (12  oz), one-half glass of wine (5 oz), or one shot of hard liquor (1 oz).  Do not use any products that contain nicotine or tobacco, such as cigarettes and e-cigarettes. If you need help quitting, ask your health care provider. Summary  Having a healthy lifestyle and getting preventive care can help to protect your health and wellness after age 18.  Screening and testing are the best way to find a health problem early and help you avoid having a fall. Early diagnosis and treatment give you the best chance for managing medical conditions that are more common for people who are older than age 47.  Falls are a major cause of broken bones and head injuries in people who are older than age 43. Take precautions to prevent a fall at home.  Work with your health care provider to learn what changes you can make to improve your health and wellness and to prevent falls. This information is not intended to replace advice given to you by your health care provider. Make sure you discuss any questions you have with your health care provider. Document Released: 03/10/2017 Document Revised: 03/10/2017 Document Reviewed: 03/10/2017 Elsevier Interactive Patient Education  2019 [employer].  Neuropathic Pain Neuropathic pain is pain caused by damage to the nerves that are responsible for certain sensations in your body (sensory nerves). The pain can be caused by:  Damage to the sensory nerves that send signals to your spinal cord and brain (peripheral nervous system).  Damage to the sensory nerves in your brain or spinal cord (central nervous system). Neuropathic pain can make you more sensitive to pain. Even a minor sensation can feel very painful. This is usually a long-term condition that can be difficult to treat. The type of pain differs from person to person. It may:  Start suddenly (acute), or it may develop slowly and last for a long time (chronic).  Come and go as damaged nerves heal, or it may stay  at the same level for years.  Cause emotional distress, loss of sleep, and a lower quality of life. What are the causes? The most common cause of this condition is diabetes. Many other diseases and conditions can also cause neuropathic pain. Causes of neuropathic pain can be classified as:  Toxic. This is caused by medicines and chemicals. The most common cause of toxic neuropathic pain is damage from cancer treatments (chemotherapy).  Metabolic. This can be caused by: ? Diabetes. This is the most common disease that damages the nerves. ? Lack of vitamin B from long-term alcohol abuse.  Traumatic. Any injury that cuts, crushes, or stretches a nerve can cause damage and pain. A common example is feeling pain after losing an arm or leg (phantom limb pain).  Compression-related. If a sensory nerve gets trapped or compressed for a long period of time, the blood supply to the nerve can be cut off.  Vascular. Many blood vessel diseases can cause neuropathic pain by decreasing blood supply and oxygen to nerves.  Autoimmune. This type  of pain results from diseases in which the body's defense system (immune system) mistakenly attacks sensory nerves. Examples of autoimmune diseases that can cause neuropathic pain include lupus and multiple sclerosis.  Infectious. Many types of viral infections can damage sensory nerves and cause pain. Shingles infection is a common cause of this type of pain.  Inherited. Neuropathic pain can be a symptom of many diseases that are passed down through families (genetic). What increases the risk? You are more likely to develop this condition if:  You have diabetes.  You smoke.  You drink too much alcohol.  You are taking certain medicines, including medicines that kill cancer cells (chemotherapy) or that treat immune system disorders. What are the signs or symptoms? The main symptom is pain. Neuropathic pain is often described as:  Burning.  Shock-like.   Stinging.  Hot or cold.  Itching. How is this diagnosed? No single test can diagnose neuropathic pain. It is diagnosed based on:  Physical exam and your symptoms. Your health care provider will ask you about your pain. You may be asked to use a pain scale to describe how bad your pain is.  Tests. These may be done to see if you have a high sensitivity to pain and to help find the cause and location of any sensory nerve damage. They include: ? Nerve conduction studies to test how well nerve signals travel through your sensory nerves (electrodiagnostic testing). ? Stimulating your sensory nerves through electrodes on your skin and measuring the response in your spinal cord and brain (somatosensory evoked potential).  Imaging studies, such as: ? X-rays. ? CT scan. ? MRI. How is this treated? Treatment for neuropathic pain may change over time. You may need to try different treatment options or a combination of treatments. Some options include:  Treating the underlying cause of the neuropathy, such as diabetes, kidney disease, or vitamin deficiencies.  Stopping medicines that can cause neuropathy, such as chemotherapy.  Medicine to relieve pain. Medicines may include: ? Prescription or over-the-counter pain medicine. ? Anti-seizure medicine. ? Antidepressant medicines. ? Pain-relieving patches that are applied to painful areas of skin. ? A medicine to numb the area (local anesthetic), which can be injected as a nerve block.  Transcutaneous nerve stimulation. This uses electrical currents to block painful nerve signals. The treatment is painless.  Alternative treatments, such as: ? Acupuncture. ? Meditation. ? Massage. ? Physical therapy. ? Pain management programs. ? Counseling. Follow these instructions at home: Medicines   Take over-the-counter and prescription medicines only as told by your health care provider.  Do not drive or use heavy machinery while taking  prescription pain medicine.  If you are taking prescription pain medicine, take actions to prevent or treat constipation. Your health care provider may recommend that you: ? Drink enough fluid to keep your urine pale yellow. ? Eat foods that are high in fiber, such as fresh fruits and vegetables, whole grains, and beans. ? Limit foods that are high in fat and processed sugars, such as fried or sweet foods. ? Take an over-the-counter or prescription medicine for constipation. Lifestyle   Have a good support system at home.  Consider joining a chronic pain support group.  Do not use any products that contain nicotine or tobacco, such as cigarettes and e-cigarettes. If you need help quitting, ask your health care provider.  Do not drink alcohol. General instructions  Learn as much as you can about your condition.  Work closely with all your health care  providers to find the treatment plan that works best for you.  Ask your health care provider what activities are safe for you.  Keep all follow-up visits as told by your health care provider. This is important. Contact a health care provider if:  Your pain treatments are not working.  You are having side effects from your medicines.  You are struggling with tiredness (fatigue), mood changes, depression, or anxiety. Summary  Neuropathic pain is pain caused by damage to the nerves that are responsible for certain sensations in your body (sensory nerves).  Neuropathic pain may come and go as damaged nerves heal, or it may stay at the same level for years.  Neuropathic pain is usually a long-term condition that can be difficult to treat. Consider joining a chronic pain support group. This information is not intended to replace advice given to you by your health care provider. Make sure you discuss any questions you have with your health care provider. Document Released: 01/23/2004 Document Revised: 05/14/2017 Document Reviewed:  05/14/2017 Elsevier Interactive Patient Education  2019 [employer].

## 2018-07-22 NOTE — Progress Notes (Signed)
Subjective:    Katherine [name] is a 68 y.o. female who presents for Medicare Annual/Subsequent preventive examination.  Preventive Screening-Counseling & Management  Tobacco Social History   Tobacco Use  Smoking Status Never Smoker  Smokeless Tobacco Never Used     Problems Prior to Visit 1.  Neuropathy:  mainly in hands and feet.  Left hand greater than right hand.  Dye from newspaper makes sensation worse.  Currently using EMLA time 3 times daily, Lyrica helps some.  In the past tramadol made patient stumbling.  Saw neurology in the past, but no recent visits. 2.  HTN: Taking hydrochlorothiazide 12.5 mg, lisinopril 10 mg.  Denies headaches, chest pain, changes in vision 3.  HLD: Triglycerides 200 and LDL 99 on last lipid panel 02/09/2018.  Trying to eat better, decreased fried food intake, increase p.o. intake of water.  Current Problems (verified) Patient Active Problem List   Diagnosis Date Noted  . 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 Body mass index 40.0-44.9, adult ([place]) 10/22/2017  . Closed displaced oblique fracture of shaft of ulna with malunion, subsequent encounter 07/28/2017  . 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 Pain in right wrist 06/01/2017  . Trigger finger, right index finger 09/28/2016  . Impaired glucose tolerance 09/09/2015  . Hyperparathyroidism ([place]) 05/26/2013  . Hereditary and idiopathic peripheral neuropathy 12/29/2012  . Morbid obesity ([place])   . Menopausal symptoms 11/10/2011  . DIVERTICULOSIS, COLON 11/02/2007  . Dyslipidemia 03/09/2006  . Essential hypertension 03/09/2006  . NEPHROLITHIASIS, HX OF 03/09/2006    Medications Prior to Visit Current Outpatient Medications on File Prior to Visit  Medication Sig Dispense Refill  . acetaminophen (TYLENOL) 500 MG tablet Take 2 tablets (1,000 mg total) by mouth every 6 (six) hours. 30 tablet 0  . b complex vitamins tablet Take 1 tablet by mouth daily.     . calcium-vitamin D (OSCAL WITH D) 500-200 MG-UNIT tablet Take 1 tablet by mouth 3 (three) times daily. 90 tablet 12  . Cholecalciferol (VITAMIN D) 50 MCG (2000 UT) tablet Take 2,000 Units by mouth daily.     . cycloSPORINE (RESTASIS) 0.05 % ophthalmic emulsion Place 1 drop into both eyes 2 (two) times daily.    . hydrochlorothiazide (HYDRODIURIL) 12.5 MG tablet Take 12.5 mg by mouth daily.    Marland Kitchen L-Methylfolate-Algae-B12-B6 (METANX) 3-90.314-2-35 MG CAPS TAKE 1 CAPSULE BY MOUTH TWO TIMES A DAY 180 capsule 2  . lisinopril (PRINIVIL,ZESTRIL) 10 MG tablet TAKE 1 TABLET BY MOUTH  DAILY (Patient taking differently: Take 10 mg by mouth daily. ) 90 tablet 1  . methocarbamol (ROBAXIN) 500 MG tablet Take 1-2 tablets (500-1,000 mg total) by mouth every 6 (six) hours as needed for muscle spasms. 60 tablet 0  . Multiple Vitamin (MULTIVITAMIN) tablet Take 1 tablet by mouth daily.    . nortriptyline (PAMELOR) 25 MG capsule TAKE 3 CAPSULES BY MOUTH AT BEDTIME (Patient taking differently: Take 75 mg by mouth at bedtime. ) 270 capsule 4  . pregabalin (LYRICA) 100 MG capsule Take 1 capsule (100 mg total) by mouth 2 (two) times daily. 180 capsule 2  . traMADol (ULTRAM) 50 MG tablet Take one to two tablets by mouth every 6 hours if needed for pain, Prescribed by Neurology (Patient taking differently: Take 50-100 mg by mouth every 6 (six) hours as needed for moderate pain. Take one to two tablets by mouth every 6 hours if needed for pain, Prescribed by Neurology) 180 tablet 4  . [  DISCONTINUED] gabapentin (NEURONTIN) 300 MG capsule Take 300 mg by mouth 3 (three) times daily.       No current facility-administered medications on file prior to visit.     Current Medications (verified) Current Outpatient Medications  Medication Sig Dispense Refill  . acetaminophen (TYLENOL) 500 MG tablet Take 2 tablets (1,000 mg total) by mouth every 6 (six) hours. 30 tablet 0  . b complex vitamins tablet Take 1 tablet by mouth daily.     . calcium-vitamin D (OSCAL WITH D) 500-200 MG-UNIT tablet Take 1 tablet by mouth 3 (three) times daily. 90 tablet 12  . Cholecalciferol (VITAMIN D) 50 MCG (2000 UT) tablet Take 2,000 Units by mouth daily.     . cycloSPORINE (RESTASIS) 0.05 % ophthalmic emulsion Place 1 drop into both eyes 2 (two) times daily.    . hydrochlorothiazide (HYDRODIURIL) 12.5 MG tablet Take 12.5 mg by mouth daily.    Marland Kitchen L-Methylfolate-Algae-B12-B6 (METANX) 3-90.314-2-35 MG CAPS TAKE 1 CAPSULE BY MOUTH TWO TIMES A DAY 180 capsule 2  . lidocaine-prilocaine (EMLA) cream Apply 1 application topically 3 (three) times daily as needed (neuropathy). Apply four times per day as needed. 5800 g 5  . lisinopril (PRINIVIL,ZESTRIL) 10 MG tablet TAKE 1 TABLET BY MOUTH  DAILY (Patient taking differently: Take 10 mg by mouth daily. ) 90 tablet 1  . methocarbamol (ROBAXIN) 500 MG tablet Take 1-2 tablets (500-1,000 mg total) by mouth every 6 (six) hours as needed for muscle spasms. 60 tablet 0  . Multiple Vitamin (MULTIVITAMIN) tablet Take 1 tablet by mouth daily.    . nortriptyline (PAMELOR) 25 MG capsule TAKE 3 CAPSULES BY MOUTH AT BEDTIME (Patient taking differently: Take 75 mg by mouth at bedtime. ) 270 capsule 4  . pregabalin (LYRICA) 100 MG capsule Take 1 capsule (100 mg total) by mouth 2 (two) times daily. 180 capsule 2  . traMADol (ULTRAM) 50 MG tablet Take one to two tablets by mouth every 6 hours if needed for pain, Prescribed by Neurology (Patient taking differently: Take 50-100 mg by mouth every 6 (six) hours as needed for moderate pain. Take one to two tablets by mouth every 6 hours if needed for pain, Prescribed by Neurology) 180 tablet 4   No current facility-administered medications for this visit.      Allergies (verified) Erythromycin   PAST HISTORY  Family History Family History  Problem Relation Age of Onset  . Stroke Mother   . Cancer Mother 61       bone cancer  . Diabetes Sister     Social  History Social History   Tobacco Use  . Smoking status: Never Smoker  . Smokeless tobacco: Never Used  Substance Use Topics  . Alcohol use: Not Currently     Are there smokers in your home (other than you)? No  Risk Factors Current exercise habits: Exercising less 2/2 neuropathy.  In physical therapy as s/p knee surgery December 2019  Dietary issues discussed: Decreasing calories, increasing intake of vegetables and water  Cardiac risk factors: advanced age (older than 54 for men, 78 for women), dyslipidemia, hypertension and obesity (BMI >= 30 kg/m2).  Depression Screen (Note: if answer to either of the following is "Yes", a more complete depression screening is indicated)   Over the past two weeks, have you felt down, depressed or hopeless? No  Over the past two weeks, have you felt little interest or pleasure in doing things? No  Have you lost interest or pleasure in daily  life? No  Do you often feel hopeless? No  Do ? No Managing money?  No Feedinyou cry easily over simple problems? No  Activities of Daily Living In your present state of health, do you have any difficulty performing the following activities?:  Drivingg yourself? No Getting from bed to chair? No Climbing a flight of stairs? No Preparing food and eating?: No Bathing or showering? No Getting dressed: No Getting to the toilet? No Using the toilet:No Moving around from place to place: No In the past year have you fallen or had a near fall?:No  Hearing Difficulties: No Do you often ask people to speak up or repeat themselves? No Do you experience ringing or noises in your ears? No Do you have difficulty understanding soft or whispered voices? No   Do you feel that you have a problem with memory? No  Do you often misplace items? No  Do you feel safe at home?  Yes  Cognitive Testing  Alert? Yes  Normal Appearance?Yes  Oriented to person? Yes  Place? Yes   Time? Yes  Recall of three objects?   Yes  Can perform simple calculations? Yes  Displays appropriate judgment?Yes  Can read the correct time from a watch face?Yes   Advanced Directives have been discussed with the patient? Yes  List the Names of Other Physician/Practitioners you currently use: 1.  Vickey Huger, MD and Frankey Shown, MD  Orthopedics  Indicate any recent Medical Services you may have received from other than [facility] providers in the past year (date may be approximate).  Immunization History  Administered Date(s) Administered  . Influenza Split 02/18/2011, 05/26/2013  . Influenza, High Dose Seasonal PF 03/11/2016, 03/24/2017, 02/09/2018  . Influenza,inj,Quad PF,6+ Mos 05/29/2014, 03/12/2015, 05/28/2015  . Pneumococcal Conjugate-13 05/31/2014  . Pneumococcal Polysaccharide-23 06/22/2016  . Td 01/09/2006  . Tdap 03/01/2018  . Zoster 02/18/2011    Screening Tests Health Maintenance  Topic Date Due  . Hepatitis C Screening  1950-11-17  . MAMMOGRAM  10/21/2018  . COLONOSCOPY  01/15/2025  . TETANUS/TDAP  03/01/2028  . INFLUENZA VACCINE  Completed  . DEXA SCAN  Completed  . PNA vac Low Risk Adult  Completed    All answers were reviewed with the patient and necessary referrals were made:  Billie Ruddy, MD   07/22/2018   History reviewed: allergies, current medications, past family history, past medical history, past social history, past surgical history and problem list  Review of Systems Pertinent items noted in HPI and remainder of comprehensive ROS otherwise negative.    Objective:     Vision by Snellen chart: right ELF:YBOFBPZ declines measurement, left WCH:ENIDPOE declines measurement  Body mass index is 41.07 kg/m. BP 118/76   Pulse (!) 104   Temp 98.5 F (36.9 C) (Oral)   Wt 243 lb (110.2 kg)   SpO2 97%   BMI 41.07 kg/m   General appearance: alert, cooperative and no distress Head: Normocephalic, without obvious abnormality, atraumatic Eyes: conjunctivae/corneas clear. PERRL, EOM's  intact. Fundi benign. Ears: normal TM's and external ear canals both ears Nose: Nares normal. Septum midline. Mucosa normal. No drainage or sinus tenderness. Throat: lips, mucosa, and tongue normal; teeth and gums normal Neck: no adenopathy, no carotid bruit, no JVD, supple, symmetrical, trachea midline and thyroid not enlarged, symmetric, no tenderness/mass/nodules Lungs: clear to auscultation bilaterally Heart: regular rate and rhythm, S1, S2 normal, no murmur, click, rub or gallop Abdomen: soft, non-tender; bowel sounds normal; no masses,  no organomegaly Extremities: extremities  normal, atraumatic, no cyanosis or edema Skin: Skin color, texture, turgor normal. No rashes or lesions Neurologic: Alert and oriented X 3, normal strength and tone. Normal symmetric reflexes. Normal coordination and gait     Assessment:   Pt is a 68 yo female with pmh sig for HTN, HLD, and severe neuropathy seen for wellness visit.     Plan:       Neuropathy -Continue EMLA cream.  Refill sent -Discussed obtaining labs including B12, folate -Continue Lyrica -Discussed going back to neurology for additional input.  HTN -Continue current medications  HLD -Recent lipid panel 02/09/2018 -Discussed lifestyle modification  During the course of the visit the patient was educated and counseled about appropriate screening and preventive services including:    Nutrition counseling   Mammogram, immunizations, colonoscopy up-to-date.  Diet review for nutrition referral? Not Indicated   Patient Instructions (the written plan) was given to the patient.  Medicare Attestation I have personally reviewed: The patient's medical and social history Their use of alcohol, tobacco or illicit drugs Their current medications and supplements The patient's functional ability including ADLs,fall risks, home safety risks, cognitive, and hearing and visual impairment Diet and physical activities Evidence for  depression or mood disorders  The patient's weight, height, BMI, and visual acuity have been recorded in the chart.  I have made referrals, counseling, and provided education to the patient based on review of the above and I have provided the patient with a written personalized care plan for preventive services.     Billie Ruddy, MD   07/22/2018

## 2018-07-26 ENCOUNTER — Encounter: Payer: Self-pay | Admitting: Family Medicine

## 2018-07-28 ENCOUNTER — Other Ambulatory Visit: Payer: Self-pay

## 2018-07-28 ENCOUNTER — Telehealth: Payer: Self-pay | Admitting: Family Medicine

## 2018-07-28 DIAGNOSIS — G629 Polyneuropathy, unspecified: Secondary | ICD-10-CM

## 2018-07-28 MED ORDER — LIDOCAINE-PRILOCAINE 2.5-2.5 % EX CREA: TOPICAL_CREAM | CUTANEOUS | 4 refills | Status: DC

## 2018-07-28 NOTE — Telephone Encounter (Signed)
Spoke with jazz with [facility],clarified directions of the Rx

## 2018-07-28 NOTE — Telephone Encounter (Signed)
Copied from [place] [id number]. Topic: General - Other >> Jul 28, 2018 10:06 AM Lennox Solders wrote: Reason for CRM: jazz with [facility] is calling to verify  rx emla reference number [id number]. The directions does not match requested amount of medication

## 2018-08-03 ENCOUNTER — Telehealth: Payer: Self-pay | Admitting: Family Medicine

## 2018-08-03 NOTE — Telephone Encounter (Signed)
Patient called and left a voicemail stated that she needed to speak to someone about her prescription instructions. Called her back to get clarity on her prescription and got no answer. Left a message for her to call back to give that information.

## 2018-08-18 ENCOUNTER — Other Ambulatory Visit: Payer: Self-pay | Admitting: Family Medicine

## 2018-08-23 ENCOUNTER — Other Ambulatory Visit: Payer: Self-pay | Admitting: Family Medicine

## 2018-08-23 NOTE — Telephone Encounter (Signed)
Sending back to office to review. Two are refills not delegated to NT

## 2018-08-25 MED ORDER — PREGABALIN 100 MG PO CAPS: 100.0000 mg | ORAL_CAPSULE | Freq: Two times a day (BID) | ORAL | 2 refills | Status: DC

## 2018-08-25 MED ORDER — LISINOPRIL 10 MG PO TABS: 10.0000 mg | ORAL_TABLET | Freq: Every day | ORAL | 3 refills | Status: DC

## 2018-08-25 MED ORDER — TRAMADOL HCL 50 MG PO TABS: ORAL_TABLET | ORAL | 0 refills | Status: DC

## 2018-08-25 NOTE — Telephone Encounter (Signed)
Pt last refill for Tramadol was 09/22/2017 and Lyrica was 01/04/2018, lisinopril has been refilled, please advise.

## 2018-10-14 ENCOUNTER — Telehealth: Payer: Self-pay

## 2018-10-14 NOTE — Telephone Encounter (Signed)
Copied from [place] [phone]. Topic: Referral - Request for Referral >> Oct 13, 2018  3:31 PM Erick Blinks wrote: Has patient seen PCP for this complaint? No  *If NO, is insurance requiring patient see PCP for this issue before PCP can refer them? Referral for which specialty: Orthopedics  Preferred provider/office: [facility]  Reason for referral: Surgery exchange plastic, both knees one at a time.

## 2018-10-14 NOTE — Telephone Encounter (Signed)
Ok for referral?

## 2018-10-16 NOTE — Telephone Encounter (Signed)
That's fine

## 2018-10-18 ENCOUNTER — Other Ambulatory Visit: Payer: Self-pay

## 2018-10-18 DIAGNOSIS — Z96653 Presence of artificial knee joint, bilateral: Secondary | ICD-10-CM

## 2018-10-18 NOTE — Telephone Encounter (Signed)
Referral has been placed as requested.

## 2018-10-19 ENCOUNTER — Telehealth: Payer: Self-pay | Admitting: Family Medicine

## 2018-10-19 NOTE — Telephone Encounter (Signed)
The patient dropped a sports medicine and joint replacement form  Fax to: [phone]  ATTN: Dr. Lara Mulch  Disposition: Dr's Folder

## 2018-10-19 NOTE — Telephone Encounter (Signed)
Pt needs surgical clearance paperwork filled out. Please advise.

## 2018-10-20 NOTE — Telephone Encounter (Signed)
Form received completed and faxed as requested,pt aware

## 2018-10-20 NOTE — Telephone Encounter (Signed)
Pt surgical Clearance form has been completed and faxed to Dr Ronnie Derby office, pt is aware

## 2018-11-16 LAB — HM MAMMOGRAPHY

## 2018-11-23 ENCOUNTER — Other Ambulatory Visit (HOSPITAL_COMMUNITY): Payer: Self-pay | Admitting: *Deleted

## 2018-11-23 NOTE — Patient Instructions (Addendum)
YOU NEED TO HAVE A COVID 19 TEST ON 11-24-2018 at 41 AM, THIS TEST MUST BE DONE BEFORE SURGERY, COME TO [place]. ONCE YOUR COVID TEST IS COMPLETED, PLEASE BEGIN THE QUARANTINE INSTRUCTIONS AS OUTLINED IN YOUR HANDOUT.                Katherine [name]    Your procedure is scheduled on: 11-28-2018   Report to [facility] Main  Entrance  Report to admitting at 8:55  AM      Call this number if you have problems the morning of surgery [phone]     Remember: Do not eat food after Midnight  .. YOU MAY HAVE CLEAR LIQUIDS FROM MIDNIGHT UNTIL 4:30AM.   At 4:30AM Please finish the prescribed Pre-Surgery  drink. Nothing by mouth after you finish the  drink !              BRUSH YOUR TEETH MORNING OF SURGERY AND RINSE YOUR MOUTH OUT, NO CHEWING GUM CANDY OR MINTS.     Take these medicines the morning of surgery with A SIP OF WATER: Lyrica , Bring cream with you                                You may not have any metal on your body including hair pins and              piercings              Do not wear jewelry, make-up, lotions, powders or perfumes, deodorant             Do not wear nail polish.  Do not shave  48 hours prior to surgery.               Do not bring valuables to the hospital. [place].  Contacts, dentures or bridgework may not be worn into surgery.    _____________________________________________________________________             [facility] - Preparing for Surgery Before surgery, you can play an important role.   Because skin is not sterile, your skin needs to be as free of germs as possible.   You can reduce the number of germs on your skin by washing with CHG (chlorahexidine gluconate) soap before surgery.   CHG is an antiseptic cleaner which kills germs and bonds with the skin to continue killing germs even after washing. Please DO NOT use if you have an  allergy to CHG or antibacterial soaps .  If your skin becomes reddened/irritated stop using the CHG and inform your nurse when you arrive at Short Stay. Do not shave (including legs and underarms) for at least 48 hours prior to the first CHG showerPlease follow these instructions carefully:  1.  Shower with CHG Soap the night before surgery and the  morning of Surgery.  2.  If you choose to wash your hair, wash your hair first as usual with your  normal  shampoo.  3.  After you shampoo, rinse your hair and body thoroughly to remove the  shampoo.  4.  Use CHG as you would any other liquid soap.  You can apply chg directly  to the skin and wash                       Gently with a scrungie or clean washcloth.  5.  Apply the CHG Soap to your body ONLY FROM THE NECK DOWN.   Do not use on face/ open                           Wound or open sores. Avoid contact with eyes, ears mouth and genitals (private parts).                       Wash face,  Genitals (private parts) with your normal soap.             6.  Wash thoroughly, paying special attention to the area where your surgery  will be performed.  7.  Thoroughly rinse your body with warm water from the neck down.  8.  DO NOT shower/wash with your normal soap after using and rinsing off  the CHG Soap.                9.  Pat yourself dry with a clean towel.            10.  Wear clean pajamas.            11.  Place clean sheets on your bed the night of your first shower and do not  sleep with pets. Day of Surgery : Do not apply any lotions/deodorants the morning of surgery.  Please wear clean clothes to the hospital/surgery center.  FAILURE TO FOLLOW THESE INSTRUCTIONS MAY RESULT IN THE CANCELLATION OF YOUR SURGERY PATIENT SIGNATURE_________________________________  NURSE SIGNATURE__________________________________  ________________________________________________________________________   Katherine [name]  An incentive spirometer is a tool that can help keep your lungs clear and active. This tool measures how well you are filling your lungs with each breath. Taking long deep breaths may help reverse or decrease the chance of developing breathing (pulmonary) problems (especially infection) following:  A long period of time when you are unable to move or be active. BEFORE THE PROCEDURE   If the spirometer includes an indicator to show your best effort, your nurse or respiratory therapist will set it to a desired goal.  If possible, sit up straight or lean slightly forward. Try not to slouch.  Hold the incentive spirometer in an upright position. INSTRUCTIONS FOR USE  1. Sit on the edge of your bed if possible, or sit up as far as you can in bed or on a chair. 2. Hold the incentive spirometer in an upright position. 3. Breathe out normally. 4. Place the mouthpiece in your mouth and seal your lips tightly around it. 5. Breathe in slowly and as deeply as possible, raising the piston or the ball toward the top of the column. 6. Hold your breath for 3-5 seconds or for as long as possible. Allow the piston or ball to fall to the bottom of the column. 7. Remove the mouthpiece from your mouth and breathe out normally. 8. Rest for a few seconds and repeat Steps 1 through 7 at least 10 times every 1-2 hours when you are awake. Take your time and take a few normal breaths between deep breaths. 9. The spirometer may include an indicator to show  your best effort. Use the indicator as a goal to work toward during each repetition. 10. After each set of 10 deep breaths, practice coughing to be sure your lungs are clear. If you have an incision (the cut made at the time of surgery), support your incision when coughing by placing a pillow or rolled up towels firmly against it. Once you are able to get out of bed, walk around indoors and cough well. You may stop using the incentive spirometer when  instructed by your caregiver.  RISKS AND COMPLICATIONS  Take your time so you do not get dizzy or light-headed.  If you are in pain, you may need to take or ask for pain medication before doing incentive spirometry. It is harder to take a deep breath if you are having pain. AFTER USE  Rest and breathe slowly and easily.  It can be helpful to keep track of a log of your progress. Your caregiver can provide you with a simple table to help with this. If you are using the spirometer at home, follow these instructions: [place] IF:   You are having difficultly using the spirometer.  You have trouble using the spirometer as often as instructed.  Your pain medication is not giving enough relief while using the spirometer.  You develop fever of 100.5 F (38.1 C) or higher. SEEK IMMEDIATE MEDICAL CARE IF:   You cough up bloody sputum that had not been present before.  You develop fever of 102 F (38.9 C) or greater.  You develop worsening pain at or near the incision site. MAKE SURE YOU:   Understand these instructions.  Will watch your condition.  Will get help right away if you are not doing well or get worse. Document Released: 09/07/2006 Document Revised: 07/20/2011 Document Reviewed: 11/08/2006 [facility] Patient Information 2014 [place], [place].   ________________________________________________________________________

## 2018-11-24 ENCOUNTER — Encounter (HOSPITAL_COMMUNITY): Payer: Self-pay

## 2018-11-24 ENCOUNTER — Other Ambulatory Visit: Payer: Self-pay | Admitting: Orthopedic Surgery

## 2018-11-24 ENCOUNTER — Other Ambulatory Visit: Payer: Self-pay

## 2018-11-24 ENCOUNTER — Encounter (INDEPENDENT_AMBULATORY_CARE_PROVIDER_SITE_OTHER): Payer: Self-pay

## 2018-11-24 ENCOUNTER — Other Ambulatory Visit (HOSPITAL_COMMUNITY)
Admission: RE | Admit: 2018-11-24 | Discharge: 2018-11-24 | Disposition: A | Discharge status: Home / Self Care | Payer: Medicare Other | Source: Ambulatory Visit | Attending: Orthopedic Surgery | Admitting: Orthopedic Surgery

## 2018-11-24 ENCOUNTER — Encounter (HOSPITAL_COMMUNITY)
Admission: RE | Admit: 2018-11-24 | Discharge: 2018-11-24 | Disposition: A | Discharge status: Home / Self Care | Payer: Medicare Other | Source: Ambulatory Visit | Attending: Orthopedic Surgery | Admitting: Orthopedic Surgery

## 2018-11-24 DIAGNOSIS — Z96653 Presence of artificial knee joint, bilateral: Secondary | ICD-10-CM | POA: Insufficient documentation

## 2018-11-24 DIAGNOSIS — Z01818 Encounter for other preprocedural examination: Secondary | ICD-10-CM | POA: Diagnosis present

## 2018-11-24 DIAGNOSIS — Z79899 Other long term (current) drug therapy: Secondary | ICD-10-CM | POA: Insufficient documentation

## 2018-11-24 DIAGNOSIS — I10 Essential (primary) hypertension: Secondary | ICD-10-CM | POA: Diagnosis not present

## 2018-11-24 DIAGNOSIS — R000 Tachycardia, unspecified: Secondary | ICD-10-CM | POA: Diagnosis not present

## 2018-11-24 DIAGNOSIS — M24662 Ankylosis, left knee: Secondary | ICD-10-CM | POA: Insufficient documentation

## 2018-11-24 DIAGNOSIS — Z87442 Personal history of urinary calculi: Secondary | ICD-10-CM | POA: Diagnosis not present

## 2018-11-24 DIAGNOSIS — Z1159 Encounter for screening for other viral diseases: Secondary | ICD-10-CM | POA: Diagnosis not present

## 2018-11-24 DIAGNOSIS — G629 Polyneuropathy, unspecified: Secondary | ICD-10-CM | POA: Insufficient documentation

## 2018-11-24 HISTORY — DX: Other complications of anesthesia, initial encounter: T88.59XA

## 2018-11-24 LAB — CBC WITH DIFFERENTIAL/PLATELET
Abs Immature Granulocytes: 0.01 10*3/uL (ref 0.00–0.07)
Basophils Absolute: 0 10*3/uL (ref 0.0–0.1)
Basophils Relative: 0 %
Eosinophils Absolute: 0.2 10*3/uL (ref 0.0–0.5)
Eosinophils Relative: 6 %
HCT: 42.5 % (ref 36.0–46.0)
Hemoglobin: 13.6 g/dL (ref 12.0–15.0)
Immature Granulocytes: 0 %
Lymphocytes Relative: 41 %
Lymphs Abs: 1.4 10*3/uL (ref 0.7–4.0)
MCH: 29.5 pg (ref 26.0–34.0)
MCHC: 32 g/dL (ref 30.0–36.0)
MCV: 92.2 fL (ref 80.0–100.0)
Monocytes Absolute: 0.5 10*3/uL (ref 0.1–1.0)
Monocytes Relative: 15 %
Neutro Abs: 1.3 10*3/uL — ABNORMAL LOW (ref 1.7–7.7)
Neutrophils Relative %: 38 %
Platelets: 166 10*3/uL (ref 150–400)
RBC: 4.61 MIL/uL (ref 3.87–5.11)
RDW: 14.7 % (ref 11.5–15.5)
WBC: 3.4 10*3/uL — ABNORMAL LOW (ref 4.0–10.5)
nRBC: 0 % (ref 0.0–0.2)

## 2018-11-24 LAB — COMPREHENSIVE METABOLIC PANEL
ALT: 30 U/L (ref 0–44)
AST: 54 U/L — ABNORMAL HIGH (ref 15–41)
Albumin: 4.1 g/dL (ref 3.5–5.0)
Alkaline Phosphatase: 67 U/L (ref 38–126)
Anion gap: 10 (ref 5–15)
BUN: 8 mg/dL (ref 8–23)
CO2: 28 mmol/L (ref 22–32)
Calcium: 9.8 mg/dL (ref 8.9–10.3)
Chloride: 101 mmol/L (ref 98–111)
Creatinine, Ser: 0.83 mg/dL (ref 0.44–1.00)
GFR calc Af Amer: >60 mL/min (ref >60)
GFR calc non Af Amer: >60 mL/min (ref >60)
Glucose, Bld: 128 mg/dL — ABNORMAL HIGH (ref 70–99)
Potassium: 4.2 mmol/L (ref 3.5–5.1)
Sodium: 139 mmol/L (ref 135–145)
Total Bilirubin: 0.7 mg/dL (ref 0.3–1.2)
Total Protein: 7.5 g/dL (ref 6.5–8.1)

## 2018-11-24 LAB — SURGICAL PCR SCREEN
MRSA, PCR: NEGATIVE
Staphylococcus aureus: NEGATIVE

## 2018-11-24 LAB — SARS CORONAVIRUS 2 (TAT 6-24 HRS): SARS Coronavirus 2: NEGATIVE

## 2018-11-25 NOTE — Progress Notes (Signed)
Anesthesia Chart Review:   Case: [id number] Date/Time: 11/28/18 1110   Procedure: SYNOVECTOMY WITH POLY [place] (Left )   Anesthesia type: Spinal   Pre-op diagnosis: infected left knee   [facility] 06 [facility]   Surgeon: Vickey Huger, MD      DISCUSSION:  Pt is a 68 year old female with hx HTN.    VS: BP (!) 107/53   Pulse (!) 105   Temp 37.1 C (Oral)   Resp 18   Ht 5\' 4"  (1.626 m)   Wt 116.1 kg   SpO2 98%   BMI 43.94 kg/m  - Pt tachycardic at presurgical testing. A review of VS from office visit notes over last 2 years indicate HR of 105 is baseline HR for patient.   PROVIDERS: - PCP is  Billie Ruddy, MD   LABS: Labs reviewed: Acceptable for surgery. (all labs ordered are listed, but only abnormal results are displayed)  Labs Reviewed  CBC WITH DIFFERENTIAL/PLATELET - Abnormal; Notable for the following components:      Result Value   WBC 3.4 (*)    Neutro Abs 1.3 (*)    All other components within normal limits  COMPREHENSIVE METABOLIC PANEL - Abnormal; Notable for the following components:   Glucose, Bld 128 (*)    AST 54 (*)    All other components within normal limits  SURGICAL PCR SCREEN     EKG 11/24/18: Sinus tachycardia (106 bpm). Cannot rule out Anterior infarct, age undetermined. No significant change since tracing 07/06/17    Past Medical History:  Diagnosis Date  . Complication of anesthesia     neuropathy in feet flares after surgery from PAS hose. needs cream on feet  . DDD (degenerative disc disease), lumbar 2012  . Dental crowns present   . GERD (gastroesophageal reflux disease)    occ, no medications at this time  . History of anemia 1991   not since hysterectomy  . History of gross hematuria 2002   with kidney stones  . History of hyperparathyroidism   . History of kidney stones   . Hypertension    states under control with meds., has been on med. x 20 yr.  . Impingement syndrome of right shoulder 2017  . Lymphedema of  both lower extremities    due to neuropathy  . Lymphedema of both upper extremities    due to neuropathy  . Obese   . Osteoarthritis   . Peripheral neuropathy    hands and feet  . Sciatica   . Triangular fibrocartilage complex tear 06/2017   right  . 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 Wears partial dentures    upper and lower    Past Surgical History:  Procedure Laterality Date  . ABDOMINAL HYSTERECTOMY  1991   partial  . CARPAL TUNNEL RELEASE Right 08/09/2002  . CARPAL TUNNEL RELEASE Left 11/03/2006  . COLONOSCOPY  01/2016  . COLONOSCOPY WITH PROPOFOL  11/24/2011  . CYSTOSCOPY WITH RETROGRADE PYELOGRAM, URETEROSCOPY AND STENT PLACEMENT Left 12/18/2014   Procedure: CYSTOSCOPY WITH RETROGRADE PYELOGRAM, URETEROSCOPY AND STENT PLACEMENT left ureter;  Surgeon: Raynelle Bring, MD;  [facility];  Service: Urology;  Laterality: Left;  . DILATION AND CURETTAGE OF UTERUS    . ELBOW ARTHROSCOPY Right   . EXTRACORPOREAL SHOCK WAVE LITHOTRIPSY Left 03/08/2017   Procedure: LEFT EXTRACORPOREAL SHOCK WAVE LITHOTRIPSY (ESWL);  Surgeon: Raynelle Bring, MD;  [facility];  Service: Urology;  Laterality: Left;  . HOLMIUM LASER  APPLICATION Left 0/09/6977   Procedure: HOLMIUM LASER APPLICATION left ureter ;  Surgeon: Raynelle Bring, MD;  [facility];  Service: Urology;  Laterality: Left;  . PARATHYROIDECTOMY Right 05/03/2015   Procedure: RIGHT SUPERIOR PARATHYROIDECTOMY;  Surgeon: Armandina Gemma, MD;  Location: [place];  Service: General;  Laterality: Right;  . PATELLA REALIGNMENT Bilateral    as a teenager  . ROTATOR CUFF REPAIR Right    x2  . SHOULDER ARTHROSCOPY Right    x 2  . SHOULDER ARTHROSCOPY W/ ROTATOR CUFF REPAIR Left 04/26/2002  . TONSILLECTOMY    . TOTAL KNEE ARTHROPLASTY Left 09/10/2003  . TOTAL KNEE ARTHROPLASTY Right 11/10/2004  . TOTAL KNEE ARTHROPLASTY Right 05/02/2018   Procedure: Right polyethelene exchange and open synovectomy;  Surgeon: Vickey Huger, MD;   [facility];  Service: Orthopedics;  Laterality: Right;  . TRIGGER FINGER RELEASE Right 08/06/2008   ring finger  . TRIGGER FINGER RELEASE     multiple - right thumb, left index/long/ring/thumb  . ULNA OSTEOTOMY Right 07/28/2017   Procedure: REVISION RIGHT ULNAR SHORTENING OSTEOTOMY;  Surgeon: Leandrew Koyanagi, MD;  Location: [place];  Service: Orthopedics;  Laterality: Right;  . WRIST ARTHROSCOPY Left 06/16/2000  . WRIST ARTHROSCOPY WITH ULNA SHORTENING Right 07/07/2017   Procedure: RIGHT WRIST ARTHROSCOPY WITH ULNAR SHORTENING OSTEOTOMY;  Surgeon: Leandrew Koyanagi, MD;  Location: [place];  Service: Orthopedics;  Laterality: Right;    MEDICATIONS: . Cholecalciferol (VITAMIN D) 50 MCG (2000 UT) tablet  . cycloSPORINE (RESTASIS) 0.05 % ophthalmic emulsion  . hydrochlorothiazide (HYDRODIURIL) 12.5 MG tablet  . L-Methylfolate-Algae-B12-B6 (METANX) 3-90.314-2-35 MG CAPS  . lidocaine-prilocaine (EMLA) cream  . lisinopril (PRINIVIL,ZESTRIL) 10 MG tablet  . Multiple Vitamin (MULTIVITAMIN) tablet  . nortriptyline (PAMELOR) 25 MG capsule  . pregabalin (LYRICA) 100 MG capsule  . traMADol (ULTRAM) 50 MG tablet   No current facility-administered medications for this encounter.     If no changes, I anticipate pt can proceed with surgery as scheduled.   Willeen Cass, FNP-BC [facility]/Anesthesiology Phone: [phone] 11/25/2018 11:54 AM

## 2018-11-25 NOTE — Anesthesia Preprocedure Evaluation (Addendum)
Anesthesia Evaluation  Patient identified by MRN, date of birth, ID band Patient awake    Reviewed: Allergy & Precautions, NPO status , Patient's Chart, lab work & pertinent test results  Airway Mallampati: II  TM Distance: <3 FB Neck ROM: Full    Dental no notable dental hx.    Pulmonary neg pulmonary ROS,    Pulmonary exam normal breath sounds clear to auscultation       Cardiovascular hypertension, Normal cardiovascular exam Rhythm:Regular Rate:Normal     Neuro/Psych negative neurological ROS  negative psych ROS   GI/Hepatic negative GI ROS, Neg liver ROS,   Endo/Other  Morbid obesity  Renal/GU negative Renal ROS  negative genitourinary   Musculoskeletal negative musculoskeletal ROS (+)   Abdominal   Peds negative pediatric ROS (+)  Hematology negative hematology ROS (+)   Anesthesia Other Findings   Reproductive/Obstetrics negative OB ROS                            Anesthesia Physical Anesthesia Plan  ASA: III  Anesthesia Plan: Spinal   Post-op Pain Management:  Regional for Post-op pain   Induction: Intravenous  PONV Risk Score and Plan: 2 and Ondansetron, Dexamethasone and Treatment may vary due to age or medical condition  Airway Management Planned: Simple Face Mask  Additional Equipment:   Intra-op Plan:   Post-operative Plan:   Informed Consent: I have reviewed the patients History and Physical, chart, labs and discussed the procedure including the risks, benefits and alternatives for the proposed anesthesia with the patient or authorized representative who has indicated his/her understanding and acceptance.     Dental advisory given  Plan Discussed with: CRNA and Surgeon  Anesthesia Plan Comments: (See APP note by Willeen Cass, FNP)       Anesthesia Quick Evaluation

## 2018-11-27 MED ORDER — BUPIVACAINE LIPOSOME 1.3 % IJ SUSP
20.0000 mL | Freq: Once | INTRAMUSCULAR | Status: DC
  Filled 2018-11-27: qty 20

## 2018-11-28 ENCOUNTER — Inpatient Hospital Stay (HOSPITAL_COMMUNITY)
Admission: RE | Admit: 2018-11-28 | Discharge: 2018-11-29 | DRG: 487 | Disposition: A | Discharge status: Home / Self Care | Payer: Medicare Other | Attending: Orthopedic Surgery | Admitting: Orthopedic Surgery

## 2018-11-28 ENCOUNTER — Other Ambulatory Visit: Payer: Self-pay

## 2018-11-28 ENCOUNTER — Encounter (HOSPITAL_COMMUNITY): Payer: Self-pay | Admitting: Anesthesiology

## 2018-11-28 ENCOUNTER — Encounter (HOSPITAL_COMMUNITY)
Admission: RE | Disposition: A | Discharge status: Home / Self Care | Payer: Self-pay | Source: Home / Self Care | Attending: Orthopedic Surgery

## 2018-11-28 ENCOUNTER — Inpatient Hospital Stay (HOSPITAL_COMMUNITY): Payer: Medicare Other | Admitting: Emergency Medicine

## 2018-11-28 ENCOUNTER — Inpatient Hospital Stay (HOSPITAL_COMMUNITY): Payer: Medicare Other | Admitting: Anesthesiology

## 2018-11-28 DIAGNOSIS — Z881 Allergy status to other antibiotic agents status: Secondary | ICD-10-CM

## 2018-11-28 DIAGNOSIS — M65162 Other infective (teno)synovitis, left knee: Secondary | ICD-10-CM | POA: Diagnosis present

## 2018-11-28 DIAGNOSIS — Y831 Surgical operation with implant of artificial internal device as the cause of abnormal reaction of the patient, or of later complication, without mention of misadventure at the time of the procedure: Secondary | ICD-10-CM | POA: Diagnosis present

## 2018-11-28 DIAGNOSIS — G609 Hereditary and idiopathic neuropathy, unspecified: Secondary | ICD-10-CM | POA: Diagnosis present

## 2018-11-28 DIAGNOSIS — T8454XA Infection and inflammatory reaction due to internal left knee prosthesis, initial encounter: Principal | ICD-10-CM | POA: Diagnosis present

## 2018-11-28 DIAGNOSIS — I10 Essential (primary) hypertension: Secondary | ICD-10-CM | POA: Diagnosis present

## 2018-11-28 DIAGNOSIS — Z9089 Acquired absence of other organs: Secondary | ICD-10-CM

## 2018-11-28 DIAGNOSIS — Z79899 Other long term (current) drug therapy: Secondary | ICD-10-CM

## 2018-11-28 DIAGNOSIS — Y792 Prosthetic and other implants, materials and accessory orthopedic devices associated with adverse incidents: Secondary | ICD-10-CM | POA: Diagnosis present

## 2018-11-28 DIAGNOSIS — T84093A Other mechanical complication of internal left knee prosthesis, initial encounter: Secondary | ICD-10-CM | POA: Diagnosis present

## 2018-11-28 DIAGNOSIS — Z96659 Presence of unspecified artificial knee joint: Secondary | ICD-10-CM

## 2018-11-28 DIAGNOSIS — Z8639 Personal history of other endocrine, nutritional and metabolic disease: Secondary | ICD-10-CM

## 2018-11-28 DIAGNOSIS — Z96651 Presence of right artificial knee joint: Secondary | ICD-10-CM | POA: Diagnosis present

## 2018-11-28 DIAGNOSIS — I890 Lymphedema, not elsewhere classified: Secondary | ICD-10-CM | POA: Diagnosis present

## 2018-11-28 HISTORY — PX: SYNOVECTOMY WITH POLY EXCHANGE: SHX6746

## 2018-11-28 SURGERY — SYNOVECTOMY WITH POLY EXCHANGE
Anesthesia: Spinal | Site: Knee | Laterality: Left

## 2018-11-28 MED ORDER — ACETAMINOPHEN 500 MG PO TABS
1000.0000 mg | ORAL_TABLET | Freq: Four times a day (QID) | ORAL | Status: AC
  Administered 2018-11-28 – 2018-11-29 (×4): 1000 mg via ORAL
  Filled 2018-11-28 (×4): qty 2

## 2018-11-28 MED ORDER — PHENOL 1.4 % MT LIQD: 1.0000 | OROMUCOSAL | Status: DC | PRN

## 2018-11-28 MED ORDER — FLEET ENEMA 7-19 GM/118ML RE ENEM: 1.0000 | ENEMA | Freq: Once | RECTAL | Status: DC | PRN

## 2018-11-28 MED ORDER — ONDANSETRON HCL 4 MG/2ML IJ SOLN
INTRAMUSCULAR | Status: DC | PRN
  Administered 2018-11-28: 4 mg via INTRAVENOUS

## 2018-11-28 MED ORDER — METHOCARBAMOL 500 MG PO TABS
500.0000 mg | ORAL_TABLET | Freq: Four times a day (QID) | ORAL | Status: DC | PRN
  Filled 2018-11-28: qty 1

## 2018-11-28 MED ORDER — HYDROMORPHONE HCL 1 MG/ML IJ SOLN
0.2500 mg | INTRAMUSCULAR | Status: DC | PRN
  Administered 2018-11-28 (×4): 0.5 mg via INTRAVENOUS

## 2018-11-28 MED ORDER — PROPOFOL 500 MG/50ML IV EMUL
INTRAVENOUS | Status: DC | PRN
  Administered 2018-11-28: 75 ug/kg/min via INTRAVENOUS

## 2018-11-28 MED ORDER — MIDAZOLAM HCL 5 MG/5ML IJ SOLN
INTRAMUSCULAR | Status: DC | PRN
  Administered 2018-11-28 (×2): 1 mg via INTRAVENOUS

## 2018-11-28 MED ORDER — STERILE WATER FOR IRRIGATION IR SOLN
Status: DC | PRN
  Administered 2018-11-28 (×2): 1000 mL

## 2018-11-28 MED ORDER — CHLORHEXIDINE GLUCONATE 4 % EX LIQD: 60.0000 mL | Freq: Once | CUTANEOUS | Status: DC

## 2018-11-28 MED ORDER — HYDROCHLOROTHIAZIDE 25 MG PO TABS
12.5000 mg | ORAL_TABLET | Freq: Every day | ORAL | Status: DC
  Administered 2018-11-29: 12.5 mg via ORAL
  Filled 2018-11-28: qty 1

## 2018-11-28 MED ORDER — HYDROMORPHONE HCL 1 MG/ML IJ SOLN
INTRAMUSCULAR | Status: AC
  Filled 2018-11-28: qty 2

## 2018-11-28 MED ORDER — METOCLOPRAMIDE HCL 5 MG/ML IJ SOLN: 5.0000 mg | Freq: Three times a day (TID) | INTRAMUSCULAR | Status: DC | PRN

## 2018-11-28 MED ORDER — MIDAZOLAM HCL 2 MG/2ML IJ SOLN
INTRAMUSCULAR | Status: AC
  Filled 2018-11-28: qty 2

## 2018-11-28 MED ORDER — ASPIRIN EC 325 MG PO TBEC
325.0000 mg | DELAYED_RELEASE_TABLET | Freq: Two times a day (BID) | ORAL | Status: DC
  Administered 2018-11-29: 325 mg via ORAL
  Filled 2018-11-28: qty 1

## 2018-11-28 MED ORDER — SODIUM CHLORIDE 0.9 % IR SOLN
Status: DC | PRN
  Administered 2018-11-28: 1000 mL

## 2018-11-28 MED ORDER — SODIUM CHLORIDE 0.9 % IV SOLN
INTRAVENOUS | Status: DC | PRN
  Administered 2018-11-28: 20 ug/min via INTRAVENOUS

## 2018-11-28 MED ORDER — BUPIVACAINE HCL (PF) 0.5 % IJ SOLN
INTRAMUSCULAR | Status: DC | PRN
  Administered 2018-11-28: 20 mL via PERINEURAL

## 2018-11-28 MED ORDER — BUPIVACAINE-EPINEPHRINE (PF) 0.25% -1:200000 IJ SOLN
INTRAMUSCULAR | Status: AC
  Filled 2018-11-28: qty 30

## 2018-11-28 MED ORDER — CYCLOSPORINE 0.05 % OP EMUL
1.0000 [drp] | Freq: Two times a day (BID) | OPHTHALMIC | Status: DC
  Administered 2018-11-28 – 2018-11-29 (×2): 1 [drp] via OPHTHALMIC
  Filled 2018-11-28 (×2): qty 30

## 2018-11-28 MED ORDER — ZOLPIDEM TARTRATE 5 MG PO TABS: 5.0000 mg | ORAL_TABLET | Freq: Every evening | ORAL | Status: DC | PRN

## 2018-11-28 MED ORDER — FERROUS SULFATE 325 (65 FE) MG PO TABS
325.0000 mg | ORAL_TABLET | Freq: Three times a day (TID) | ORAL | Status: DC
  Filled 2018-11-28 (×2): qty 1

## 2018-11-28 MED ORDER — LISINOPRIL 10 MG PO TABS
10.0000 mg | ORAL_TABLET | Freq: Every day | ORAL | Status: DC
  Administered 2018-11-28 – 2018-11-29 (×2): 10 mg via ORAL
  Filled 2018-11-28 (×2): qty 1

## 2018-11-28 MED ORDER — BUPIVACAINE HCL 0.25 % IJ SOLN
INTRAMUSCULAR | Status: DC | PRN
  Administered 2018-11-28: 30 mL

## 2018-11-28 MED ORDER — BISACODYL 5 MG PO TBEC: 5.0000 mg | DELAYED_RELEASE_TABLET | Freq: Every day | ORAL | Status: DC | PRN

## 2018-11-28 MED ORDER — LIDOCAINE-PRILOCAINE 2.5-2.5 % EX CREA
TOPICAL_CREAM | Freq: Three times a day (TID) | CUTANEOUS | Status: DC | PRN
  Administered 2018-11-28: 22:00:00 via TOPICAL
  Filled 2018-11-28: qty 5

## 2018-11-28 MED ORDER — PROPOFOL 10 MG/ML IV BOLUS
INTRAVENOUS | Status: AC
  Filled 2018-11-28: qty 60

## 2018-11-28 MED ORDER — ONDANSETRON HCL 4 MG/2ML IJ SOLN: 4.0000 mg | Freq: Four times a day (QID) | INTRAMUSCULAR | Status: DC | PRN

## 2018-11-28 MED ORDER — SENNOSIDES-DOCUSATE SODIUM 8.6-50 MG PO TABS: 1.0000 | ORAL_TABLET | Freq: Every evening | ORAL | Status: DC | PRN

## 2018-11-28 MED ORDER — ALUM & MAG HYDROXIDE-SIMETH 200-200-20 MG/5ML PO SUSP: 30.0000 mL | ORAL | Status: DC | PRN

## 2018-11-28 MED ORDER — GABAPENTIN 300 MG PO CAPS: 300.0000 mg | ORAL_CAPSULE | Freq: Three times a day (TID) | ORAL | Status: DC

## 2018-11-28 MED ORDER — 0.9 % SODIUM CHLORIDE (POUR BTL) OPTIME
TOPICAL | Status: DC | PRN
  Administered 2018-11-28: 1000 mL

## 2018-11-28 MED ORDER — PANTOPRAZOLE SODIUM 40 MG PO TBEC
40.0000 mg | DELAYED_RELEASE_TABLET | Freq: Every day | ORAL | Status: DC
  Filled 2018-11-28: qty 1

## 2018-11-28 MED ORDER — MENTHOL 3 MG MT LOZG: 1.0000 | LOZENGE | OROMUCOSAL | Status: DC | PRN

## 2018-11-28 MED ORDER — POVIDONE-IODINE 10 % EX SWAB
2.0000 "application " | Freq: Once | CUTANEOUS | Status: AC
  Administered 2018-11-28: 2 via TOPICAL

## 2018-11-28 MED ORDER — METHOCARBAMOL 500 MG IVPB - SIMPLE MED
INTRAVENOUS | Status: AC
  Filled 2018-11-28: qty 50

## 2018-11-28 MED ORDER — HYDROMORPHONE HCL 1 MG/ML IJ SOLN: 0.5000 mg | INTRAMUSCULAR | Status: DC | PRN

## 2018-11-28 MED ORDER — TRAMADOL HCL 50 MG PO TABS
50.0000 mg | ORAL_TABLET | Freq: Four times a day (QID) | ORAL | Status: DC
  Administered 2018-11-28 – 2018-11-29 (×4): 50 mg via ORAL
  Filled 2018-11-28 (×4): qty 1

## 2018-11-28 MED ORDER — DOCUSATE SODIUM 100 MG PO CAPS
100.0000 mg | ORAL_CAPSULE | Freq: Two times a day (BID) | ORAL | Status: DC
  Administered 2018-11-28: 100 mg via ORAL
  Filled 2018-11-28 (×2): qty 1

## 2018-11-28 MED ORDER — BUPIVACAINE LIPOSOME 1.3 % IJ SUSP
INTRAMUSCULAR | Status: DC | PRN
  Administered 2018-11-28: 20 mL

## 2018-11-28 MED ORDER — METOCLOPRAMIDE HCL 5 MG PO TABS: 5.0000 mg | ORAL_TABLET | Freq: Three times a day (TID) | ORAL | Status: DC | PRN

## 2018-11-28 MED ORDER — SODIUM CHLORIDE (PF) 0.9 % IJ SOLN
INTRAMUSCULAR | Status: AC
  Filled 2018-11-28: qty 20

## 2018-11-28 MED ORDER — DEXAMETHASONE SODIUM PHOSPHATE 10 MG/ML IJ SOLN
10.0000 mg | Freq: Once | INTRAMUSCULAR | Status: AC
  Administered 2018-11-29: 10 mg via INTRAVENOUS
  Filled 2018-11-28: qty 1

## 2018-11-28 MED ORDER — CLONIDINE HCL (ANALGESIA) 100 MCG/ML EP SOLN
EPIDURAL | Status: DC | PRN
  Administered 2018-11-28: 70 ug

## 2018-11-28 MED ORDER — SODIUM CHLORIDE (PF) 0.9 % IJ SOLN
INTRAMUSCULAR | Status: DC | PRN
  Administered 2018-11-28: 20 mL

## 2018-11-28 MED ORDER — OXYCODONE HCL 5 MG PO TABS
5.0000 mg | ORAL_TABLET | ORAL | Status: DC | PRN
  Filled 2018-11-28: qty 2

## 2018-11-28 MED ORDER — DIPHENHYDRAMINE HCL 12.5 MG/5ML PO ELIX: 12.5000 mg | ORAL_SOLUTION | ORAL | Status: DC | PRN

## 2018-11-28 MED ORDER — DEXAMETHASONE SODIUM PHOSPHATE 10 MG/ML IJ SOLN
INTRAMUSCULAR | Status: DC | PRN
Start: 2018-11-28 — End: 2018-11-28
  Administered 2018-11-28: 10 mg via INTRAVENOUS

## 2018-11-28 MED ORDER — ACETAMINOPHEN 500 MG PO TABS
1000.0000 mg | ORAL_TABLET | Freq: Once | ORAL | Status: AC
  Administered 2018-11-28: 1000 mg via ORAL
  Filled 2018-11-28: qty 2

## 2018-11-28 MED ORDER — MIDAZOLAM HCL 2 MG/2ML IJ SOLN
1.0000 mg | INTRAMUSCULAR | Status: DC
  Administered 2018-11-28: 2 mg via INTRAVENOUS
  Filled 2018-11-28: qty 2

## 2018-11-28 MED ORDER — CEFAZOLIN SODIUM-DEXTROSE 2-4 GM/100ML-% IV SOLN
2.0000 g | Freq: Four times a day (QID) | INTRAVENOUS | Status: AC
  Administered 2018-11-28 – 2018-11-29 (×2): 2 g via INTRAVENOUS
  Filled 2018-11-28 (×2): qty 100

## 2018-11-28 MED ORDER — NORTRIPTYLINE HCL 25 MG PO CAPS
75.0000 mg | ORAL_CAPSULE | Freq: Every day | ORAL | Status: DC
  Administered 2018-11-28: 75 mg via ORAL
  Filled 2018-11-28: qty 3

## 2018-11-28 MED ORDER — CEFAZOLIN SODIUM-DEXTROSE 2-4 GM/100ML-% IV SOLN
2.0000 g | INTRAVENOUS | Status: AC
  Administered 2018-11-28: 2 g via INTRAVENOUS
  Filled 2018-11-28: qty 100

## 2018-11-28 MED ORDER — SODIUM CHLORIDE 0.9 % IV SOLN
INTRAVENOUS | Status: DC
  Administered 2018-11-28: 16:00:00 via INTRAVENOUS

## 2018-11-28 MED ORDER — TRANEXAMIC ACID-NACL 1000-0.7 MG/100ML-% IV SOLN
1000.0000 mg | Freq: Once | INTRAVENOUS | Status: AC
  Administered 2018-11-28: 1000 mg via INTRAVENOUS
  Filled 2018-11-28: qty 100

## 2018-11-28 MED ORDER — GABAPENTIN 300 MG PO CAPS
300.0000 mg | ORAL_CAPSULE | Freq: Once | ORAL | Status: AC
  Administered 2018-11-28: 300 mg via ORAL
  Filled 2018-11-28: qty 1

## 2018-11-28 MED ORDER — LACTATED RINGERS IV SOLN
INTRAVENOUS | Status: DC
  Administered 2018-11-28 (×2): via INTRAVENOUS

## 2018-11-28 MED ORDER — TRANEXAMIC ACID-NACL 1000-0.7 MG/100ML-% IV SOLN
1000.0000 mg | INTRAVENOUS | Status: AC
  Administered 2018-11-28: 1000 mg via INTRAVENOUS
  Filled 2018-11-28: qty 100

## 2018-11-28 MED ORDER — ONDANSETRON HCL 4 MG PO TABS: 4.0000 mg | ORAL_TABLET | Freq: Four times a day (QID) | ORAL | Status: DC | PRN

## 2018-11-28 MED ORDER — METHOCARBAMOL 500 MG IVPB - SIMPLE MED
500.0000 mg | Freq: Four times a day (QID) | INTRAVENOUS | Status: DC | PRN
  Administered 2018-11-28: 500 mg via INTRAVENOUS
  Filled 2018-11-28: qty 50

## 2018-11-28 MED ORDER — PROMETHAZINE HCL 25 MG/ML IJ SOLN: 6.2500 mg | INTRAMUSCULAR | Status: DC | PRN

## 2018-11-28 MED ORDER — CELECOXIB 200 MG PO CAPS
400.0000 mg | ORAL_CAPSULE | Freq: Once | ORAL | Status: AC
  Administered 2018-11-28: 400 mg via ORAL
  Filled 2018-11-28: qty 2

## 2018-11-28 MED ORDER — BUPIVACAINE IN DEXTROSE 0.75-8.25 % IT SOLN
INTRATHECAL | Status: DC | PRN
  Administered 2018-11-28: 1.6 mL via INTRATHECAL

## 2018-11-28 MED ORDER — PREGABALIN 100 MG PO CAPS
100.0000 mg | ORAL_CAPSULE | Freq: Two times a day (BID) | ORAL | Status: DC
  Administered 2018-11-28 – 2018-11-29 (×2): 100 mg via ORAL
  Filled 2018-11-28 (×2): qty 1

## 2018-11-28 MED ORDER — DEXAMETHASONE SODIUM PHOSPHATE 10 MG/ML IJ SOLN: 8.0000 mg | Freq: Once | INTRAMUSCULAR | Status: DC

## 2018-11-28 MED ORDER — FENTANYL CITRATE (PF) 100 MCG/2ML IJ SOLN
50.0000 ug | INTRAMUSCULAR | Status: DC
  Administered 2018-11-28: 100 ug via INTRAVENOUS
  Filled 2018-11-28: qty 2

## 2018-11-28 SURGICAL SUPPLY — 27 items
ARTI SURF NEXGEN C/D 14 3/4 (Knees) ×2 IMPLANT
BAG SPEC THK2 15X12 ZIP CLS (MISCELLANEOUS) ×1
BAG ZIPLOCK 12X15 (MISCELLANEOUS) ×3 IMPLANT
COVER SURGICAL LIGHT HANDLE (MISCELLANEOUS) ×3 IMPLANT
COVER WAND RF STERILE (DRAPES) IMPLANT
CUFF TOURN SGL QUICK 34 (TOURNIQUET CUFF) ×3
CUFF TRNQT CYL 34X4.125X (TOURNIQUET CUFF) ×2 IMPLANT
DECANTER SPIKE VIAL GLASS SM (MISCELLANEOUS) ×5 IMPLANT
DURAPREP 26ML APPLICATOR (WOUND CARE) ×6 IMPLANT
ELECT REM PT RETURN 15FT ADLT (MISCELLANEOUS) ×3 IMPLANT
GOWN STRL REUS W/TWL 2XL LVL3 (GOWN DISPOSABLE) ×3 IMPLANT
GOWN STRL REUS W/TWL XL LVL3 (GOWN DISPOSABLE) ×6 IMPLANT
HANDPIECE INTERPULSE COAX TIP (DISPOSABLE) ×3
HOOD PEEL AWAY FLYTE STAYCOOL (MISCELLANEOUS) ×10 IMPLANT
KIT TURNOVER KIT A (KITS) IMPLANT
MANIFOLD NEPTUNE II (INSTRUMENTS) ×3 IMPLANT
NEEDLE HYPO 22GX1.5 SAFETY (NEEDLE) ×3 IMPLANT
NS IRRIG 1000ML POUR BTL (IV SOLUTION) ×3 IMPLANT
PROTECTOR NERVE ULNAR (MISCELLANEOUS) ×3 IMPLANT
SET HNDPC FAN SPRY TIP SCT (DISPOSABLE) ×1 IMPLANT
STAPLER VISISTAT 35W (STAPLE) ×2 IMPLANT
SWAB COLLECTION DEVICE MRSA (MISCELLANEOUS) IMPLANT
SWAB CULTURE ESWAB REG 1ML (MISCELLANEOUS) IMPLANT
SYR CONTROL 10ML LL (SYRINGE) ×6 IMPLANT
TRAY FOLEY MTR SLVR 16FR STAT (SET/KITS/TRAYS/PACK) ×3 IMPLANT
WATER STERILE IRR 1000ML POUR (IV SOLUTION) ×3 IMPLANT
WRAP KNEE MAXI GEL POST OP (GAUZE/BANDAGES/DRESSINGS) ×6 IMPLANT

## 2018-11-28 NOTE — Transfer of Care (Signed)
Immediate Anesthesia Transfer of Care Note  Patient: Katherine [name]  Procedure(s) Performed: Procedure(s): SYNOVECTOMY WITH POLY [place] (Left)  Patient Location: PACU  Anesthesia Type:Spinal  Level of Consciousness:  sedated, patient cooperative and responds to stimulation  Airway & Oxygen Therapy:Patient Spontanous Breathing and Patient connected to face mask oxgen  Post-op Assessment:  Report given to PACU RN and Post -op Vital signs reviewed and stable  Post vital signs:  Reviewed and stable  Last Vitals:  Vitals:   11/28/18 1125 11/28/18 1130  BP: 123/86 123/85  Pulse: (!) 107 (!) 114  Resp: 15 15  Temp:    SpO2: 85% 50%    Complications: No apparent anesthesia complications

## 2018-11-28 NOTE — Anesthesia Procedure Notes (Signed)
Anesthesia Procedure Image    

## 2018-11-28 NOTE — Progress Notes (Signed)
PT Cancellation Note  Patient Details Name: Katherine [name] MRN: [id number] DOB: [birth year]   Cancelled Treatment:    Reason Eval/Treat Not Completed: Pain limiting ability to participate - Pt complaining of severe neuropathic pain, crying and stating she is unable to mobilize this evening. PT with max verbal encouragement to mobilize, pt refuses. PT to check back tomorrow am.  Julien Girt, PT Acute Rehabilitation Services Pager [phone]  Office [place] 11/28/2018, 7:30 PM

## 2018-11-28 NOTE — Anesthesia Procedure Notes (Signed)
Spinal  Patient location during procedure: OR Start time: 11/28/2018 12:00 PM End time: 11/28/2018 12:10 PM Staffing Anesthesiologist: Myrtie Soman, MD Resident/CRNA: Lavina Hamman, CRNA Performed: anesthesiologist  Preanesthetic Checklist Completed: patient identified, site marked, surgical consent, pre-op evaluation, timeout performed, IV checked, risks and benefits discussed and monitors and equipment checked Spinal Block Patient position: sitting Prep: DuraPrep Patient monitoring: heart rate, cardiac monitor, continuous pulse ox and blood pressure Approach: midline Location: L2-3 Injection technique: single-shot Needle Needle type: Spinocan  Needle gauge: 22 G Needle length: 9 cm Needle insertion depth: 7 cm Assessment Sensory level: T6 Additional Notes -heme, -para, VSS.  Pt tolerated well.  Lot and exp date OK.

## 2018-11-28 NOTE — Anesthesia Procedure Notes (Signed)
Anesthesia Regional Block: Adductor canal block   Pre-Anesthetic Checklist: ,, timeout performed, Correct Patient, Correct Site, Correct Laterality, Correct Procedure, Correct Position, site marked, Risks and benefits discussed,  Surgical consent,  Pre-op evaluation,  At surgeon's request and post-op pain management  Laterality: Left  Prep: chloraprep       Needles:  Injection technique: Single-shot  Needle Type: Echogenic Needle     Needle Length: 9cm      Additional Needles:   Procedures:,,,, ultrasound used (permanent image in chart),,,,  Narrative:  Start time: 11/28/2018 11:00 AM End time: 11/28/2018 11:07 AM Injection made incrementally with aspirations every 5 mL.  Performed by: Personally  Anesthesiologist: Myrtie Soman, MD  Additional Notes: Patient tolerated the procedure well without complications

## 2018-11-28 NOTE — Progress Notes (Signed)
Assisted Dr. Rose with left, ultrasound guided, adductor canal block. Side rails up, monitors on throughout procedure. See vital signs in flow sheet. Tolerated Procedure well.  

## 2018-11-28 NOTE — Anesthesia Postprocedure Evaluation (Signed)
Anesthesia Post Note  Patient: Katherine [name]  Procedure(s) Performed: SYNOVECTOMY WITH POLY [place] (Left Knee)     Patient location during evaluation: PACU Anesthesia Type: Spinal Level of consciousness: oriented and awake and alert Pain management: pain level controlled Vital Signs Assessment: post-procedure vital signs reviewed and stable Respiratory status: spontaneous breathing, respiratory function stable and patient connected to nasal cannula oxygen Cardiovascular status: blood pressure returned to baseline and stable Postop Assessment: no headache, no backache and no apparent nausea or vomiting Anesthetic complications: no    Last Vitals:  Vitals:   11/28/18 1400 11/28/18 1430  BP:    Pulse:    Resp:    Temp: (P) 36.6 C (P) 36.7 C  SpO2:      Last Pain:  Vitals:   11/28/18 1110  TempSrc:   PainSc: 0-No pain                 Inger Wiest S

## 2018-11-28 NOTE — Op Note (Signed)
TOTAL KNEE REPLACEMENT OPERATIVE NOTE:  11/28/2018  1:31 PM  PATIENT:  Katherine [name]  68 y.o. female  PRE-OPERATIVE DIAGNOSIS:  Failed left total knee replacement  POST-OPERATIVE DIAGNOSIS:  Polyethylene synovitis   PROCEDURE:  Procedure(s): SYNOVECTOMY WITH POLY [place]  SURGEON:  Surgeon(s): Vickey Huger, MD  PHYSICIAN ASSISTANT: Carlyon Shadow, PA-C   ANESTHESIA:   spinal  SPECIMEN: None  COUNTS:  Correct  TOURNIQUET:   Total Tourniquet Time Documented: Thigh (Left) - 23 minutes Total: Thigh (Left) - 23 minutes   DICTATION:  Indication for procedure:    The patient is a 68 y.o. female who has failed conservative treatment for infected/failed left knee.  Informed consent was obtained prior to anesthesia. The risks versus benefits of the operation were explain and in a way the patient can, and did, understand.    Description of procedure:     The patient was taken to the operating room and placed under anesthesia.  The patient was positioned in the usual fashion taking care that all body parts were adequately padded and/or protected.  A tourniquet was applied and the leg prepped and draped in the usual sterile fashion.  The extremity was exsanguinated with the esmarch and tourniquet inflated to 350 mmHg.  Pre-operative range of motion was normal.   A midline incision approximately 6-7 inches long was made with a #10 blade.  A new blade was used to make a parapatellar arthrotomy going 2-3 cm into the quadriceps tendon, over the patella, and alongside the medial aspect of the patellar tendon.  A synovectomy was then performed with the #10 blade and forceps. I then elevated the deep MCL off the medial tibial metaphysis subperiosteally around to the semimembranosus attachment.    The previous polyethylene was removed and showed obvious signs of wear. The femoral, tibial, and patella components were debrided using a rongeur. I used a bone tamp to evaluate the components and  there was no evidence of loosening.     A homan retractor was place to retract and protect the patella and lateral structures.  A Z-retractor was place medially to protect the medial structures.  I used a 72mm trail spacer block was found to offer good flexion and extension gap balancing after minimal releasing. The knee was then lavaged.   The soft tissue was infiltrated with 60cc of exparel 1.3% through a 21 gauge needle.  Hemostasis was obtained.  The arthrotomy was closed using a #1 stratofix running suture.  The deep soft tissues were closed with #0 vicryls and the subcuticular layer closed with #2-0 vicryl.  The skin was reapproximated and closed with 3.0 Monocryl.  The wound was covered with steristrips, aquacel dressing, and a TED stocking.   The patient was then awakened, extubated, and taken to the recovery room in stable condition.  BLOOD LOSS:  476LY COMPLICATIONS:  None.  PLAN OF CARE: Admit for overnight observation  PATIENT DISPOSITION:  PACU - hemodynamically stable.   Please fax a copy of this op note to my office at [phone] (please only include page 1 and 2 of the Case Information op note)

## 2018-11-28 NOTE — H&P (Signed)
Unknown Flannigan MRN:  [id number] DOB/SEX:  12-Feb-1951/female  CHIEF COMPLAINT:  Painful left Knee  HISTORY: Patient is a 68 y.o. female presented with a history of pain in the left knee. Onset of symptoms was gradual starting a few years ago with gradually worsening course since that time. Patient has been treated conservatively with over-the-counter NSAIDs and activity modification. Patient currently rates pain in the knee at 10 out of 10 with activity. There is pain at night.  PAST MEDICAL HISTORY: Patient Active Problem List   Diagnosis Date Noted  . 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 Body mass index 40.0-44.9, adult ([place]) 10/22/2017  . Closed displaced oblique fracture of shaft of ulna with malunion, subsequent encounter 07/28/2017  . 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 Pain in right wrist 06/01/2017  . Trigger finger, right index finger 09/28/2016  . Impaired glucose tolerance 09/09/2015  . Hyperparathyroidism ([place]) 05/26/2013  . Hereditary and idiopathic peripheral neuropathy 12/29/2012  . Morbid obesity ([place])   . Menopausal symptoms 11/10/2011  . DIVERTICULOSIS, COLON 11/02/2007  . Dyslipidemia 03/09/2006  . Essential hypertension 03/09/2006  . NEPHROLITHIASIS, HX OF 03/09/2006   Past Medical History:  Diagnosis Date  . Complication of anesthesia     neuropathy in feet flares after surgery from PAS hose. needs cream on feet  . DDD (degenerative disc disease), lumbar 2012  . Dental crowns present   . GERD (gastroesophageal reflux disease)    occ, no medications at this time  . History of anemia 1991   not since hysterectomy  . History of gross hematuria 2002   with kidney stones  . History of hyperparathyroidism   . History of kidney stones   . Hypertension    states under control with meds., has been on med. x 20 yr.  . Impingement syndrome of right shoulder 2017  . Lymphedema of both lower extremities    due  to neuropathy  . Lymphedema of both upper extremities    due to neuropathy  . Obese   . Osteoarthritis   . Peripheral neuropathy    hands and feet  . Sciatica   . Triangular fibrocartilage complex tear 06/2017   right  . 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 Wears partial dentures    upper and lower   Past Surgical History:  Procedure Laterality Date  . ABDOMINAL HYSTERECTOMY  1991   partial  . CARPAL TUNNEL RELEASE Right 08/09/2002  . CARPAL TUNNEL RELEASE Left 11/03/2006  . COLONOSCOPY  01/2016  . COLONOSCOPY WITH PROPOFOL  11/24/2011  . CYSTOSCOPY WITH RETROGRADE PYELOGRAM, URETEROSCOPY AND STENT PLACEMENT Left 12/18/2014   Procedure: CYSTOSCOPY WITH RETROGRADE PYELOGRAM, URETEROSCOPY AND STENT PLACEMENT left ureter;  Surgeon: Raynelle Bring, MD;  Location: [facility];  Service: Urology;  Laterality: Left;  . DILATION AND CURETTAGE OF UTERUS    . ELBOW ARTHROSCOPY Right   . EXTRACORPOREAL SHOCK WAVE LITHOTRIPSY Left 03/08/2017   Procedure: LEFT EXTRACORPOREAL SHOCK WAVE LITHOTRIPSY (ESWL);  Surgeon: Raynelle Bring, MD;  Location: [facility];  Service: Urology;  Laterality: Left;  . HOLMIUM LASER APPLICATION Left 5/0/9326   Procedure: HOLMIUM LASER APPLICATION left ureter ;  Surgeon: Raynelle Bring, MD;  Location: [facility];  Service: Urology;  Laterality: Left;  . PARATHYROIDECTOMY Right 05/03/2015   Procedure: RIGHT SUPERIOR PARATHYROIDECTOMY;  Surgeon: Armandina Gemma, MD;  Location: [facility];  Service: General;  Laterality: Right;  . PATELLA REALIGNMENT Bilateral    as a teenager  .  ROTATOR CUFF REPAIR Right    x2  . SHOULDER ARTHROSCOPY Right    x 2  . SHOULDER ARTHROSCOPY W/ ROTATOR CUFF REPAIR Left 04/26/2002  . TONSILLECTOMY    . TOTAL KNEE ARTHROPLASTY Left 09/10/2003  . TOTAL KNEE ARTHROPLASTY Right 11/10/2004  . TOTAL KNEE ARTHROPLASTY Right 05/02/2018   Procedure: Right polyethelene exchange and open synovectomy;  Surgeon: Vickey Huger, MD;  Location: [facility];  Service:  Orthopedics;  Laterality: Right;  . TRIGGER FINGER RELEASE Right 08/06/2008   ring finger  . TRIGGER FINGER RELEASE     multiple - right thumb, left index/long/ring/thumb  . ULNA OSTEOTOMY Right 07/28/2017   Procedure: REVISION RIGHT ULNAR SHORTENING OSTEOTOMY;  Surgeon: Leandrew Koyanagi, MD;  Location: [facility];  Service: Orthopedics;  Laterality: Right;  . WRIST ARTHROSCOPY Left 06/16/2000  . WRIST ARTHROSCOPY WITH ULNA SHORTENING Right 07/07/2017   Procedure: RIGHT WRIST ARTHROSCOPY WITH ULNAR SHORTENING OSTEOTOMY;  Surgeon: Leandrew Koyanagi, MD;  Location: [facility];  Service: Orthopedics;  Laterality: Right;     MEDICATIONS:   Medications Prior to Admission  Medication Sig Dispense Refill Last Dose  . Cholecalciferol (VITAMIN D) 50 MCG (2000 UT) tablet Take 2,000 Units by mouth daily.      . cycloSPORINE (RESTASIS) 0.05 % ophthalmic emulsion Place 1 drop into both eyes 2 (two) times daily.     . hydrochlorothiazide (HYDRODIURIL) 12.5 MG tablet Take 12.5 mg by mouth daily.     Marland Kitchen L-Methylfolate-Algae-B12-B6 (METANX) 3-90.314-2-35 MG CAPS TAKE 1 CAPSULE BY MOUTH TWO TIMES A DAY (Patient taking differently: Take 1 capsule by mouth 2 (two) times a day. ) 180 capsule 2   . lidocaine-prilocaine (EMLA) cream Apply 3 times daily as needed (Patient taking differently: Apply 1 application topically 4 (four) times daily as needed (for flare ups). ) 360 g 4   . lisinopril (PRINIVIL,ZESTRIL) 10 MG tablet Take 1 tablet (10 mg total) by mouth daily. 90 tablet 3   . Multiple Vitamin (MULTIVITAMIN) tablet Take 1 tablet by mouth daily.     . nortriptyline (PAMELOR) 25 MG capsule TAKE 3 CAPSULES BY MOUTH AT BEDTIME (Patient taking differently: Take 75 mg by mouth at bedtime. ) 270 capsule 4   . pregabalin (LYRICA) 100 MG capsule Take 1 capsule (100 mg total) by mouth 2 (two) times daily. 180 capsule 2   . traMADol (ULTRAM) 50 MG tablet Take one to two tablets by mouth every 6 hours  if needed for pain, Prescribed by Neurology (Patient taking differently: Take 50-100 mg by mouth every 6 (six) hours as needed for moderate pain. ) 180 tablet 0     ALLERGIES:   Allergies  Allergen Reactions  . Erythromycin Other (See Comments)    CHEST PAIN    REVIEW OF SYSTEMS:  A comprehensive review of systems was negative except for: Musculoskeletal: positive for arthralgias and bone pain   FAMILY HISTORY:   Family History  Problem Relation Age of Onset  . Stroke Mother   . Cancer Mother 45       bone cancer  . Diabetes Sister     SOCIAL HISTORY:   Social History   Tobacco Use  . Smoking status: Never Smoker  . Smokeless tobacco: Never Used  Substance Use Topics  . Alcohol use: Not Currently     EXAMINATION:  Vital signs in last 24 hours: Temp:  [98 F (36.7 C)] 98 F (36.7 C) (07/20 0839) Pulse Rate:  [117] 117 (  07/20 0839) Resp:  [18] 18 (07/20 0839) BP: (128)/(78) 128/78 (07/20 0839) SpO2:  [99 %] 99 % (07/20 0839)  BP 128/78   Pulse (!) 117   Temp 98 F (36.7 C) (Oral)   Resp 18   SpO2 99%   General Appearance:    Alert, cooperative, no distress, appears stated age  Head:    Normocephalic, without obvious abnormality, atraumatic  Eyes:    PERRL, conjunctiva/corneas clear, EOM's intact, fundi    benign, both eyes  Ears:    Normal TM's and external ear canals, both ears  Nose:   Nares normal, septum midline, mucosa normal, no drainage    or sinus tenderness  Throat:   Lips, mucosa, and tongue normal; teeth and gums normal  Neck:   Supple, symmetrical, trachea midline, no adenopathy;    thyroid:  no enlargement/tenderness/nodules; no carotid   bruit or JVD  Back:     Symmetric, no curvature, ROM normal, no CVA tenderness  Lungs:     Clear to auscultation bilaterally, respirations unlabored  Chest Wall:    No tenderness or deformity   Heart:    Regular rate and rhythm, S1 and S2 normal, no murmur, rub   or gallop  Breast Exam:    No tenderness,  masses, or nipple abnormality  Abdomen:     Soft, non-tender, bowel sounds active all four quadrants,    no masses, no organomegaly  Genitalia:    Normal female without lesion, discharge or tenderness  Rectal:    Normal tone, no masses or tenderness;   guaiac negative stool  Extremities:   Extremities normal, atraumatic, no cyanosis or edema  Pulses:   2+ and symmetric all extremities  Skin:   Skin color, texture, turgor normal, no rashes or lesions  Lymph nodes:   Cervical, supraclavicular, and axillary nodes normal  Neurologic:   CNII-XII intact, normal strength, sensation and reflexes    throughout    Musculoskeletal:  ROM 0-120, Ligaments intact,  Imaging Review Plain radiographs demonstrate s/p tka with obvious poly wear of the left knee. The overall alignment is mild valgus. The bone quality appears to be good for age and reported activity level.  Assessment/Plan: S/p tka, poly wear, left knee   The patient history, physical examination and imaging studies are consistent with s/p tka of the left knee. The patient has failed conservative treatment.  The clearance notes were reviewed.  After discussion with the patient it was felt that open synovectomy with poly exchange was indicated. The procedure,  risks, and benefits of the procedure were presented and reviewed. The risks including but not limited to aseptic loosening, infection, blood clots, vascular injury, stiffness, patella tracking problems complications among others were discussed. The patient acknowledged the explanation, agreed to proceed with the plan.  Preoperative templating of the joint replacement has been completed, documented, and submitted to the Operating Room personnel in order to optimize intra-operative equipment management.    Patient's anticipated LOS is less than 2 midnights, meeting these requirements: - Younger than 32 - Lives within 1 hour of care - Has a competent adult at home to recover with post-op  recover - NO history of  - Chronic pain requiring opiods  - Diabetes  - Coronary Artery Disease  - Heart failure  - Heart attack  - Stroke  - DVT/VTE  - Cardiac arrhythmia  - Respiratory Failure/COPD  - Renal failure  - Anemia  - Advanced Liver disease  Donia Ast 11/28/2018, 8:50 AM

## 2018-11-29 DIAGNOSIS — Y792 Prosthetic and other implants, materials and accessory orthopedic devices associated with adverse incidents: Secondary | ICD-10-CM | POA: Diagnosis present

## 2018-11-29 DIAGNOSIS — Z9089 Acquired absence of other organs: Secondary | ICD-10-CM | POA: Diagnosis not present

## 2018-11-29 DIAGNOSIS — T84093A Other mechanical complication of internal left knee prosthesis, initial encounter: Secondary | ICD-10-CM | POA: Diagnosis not present

## 2018-11-29 DIAGNOSIS — M25562 Pain in left knee: Secondary | ICD-10-CM | POA: Diagnosis present

## 2018-11-29 DIAGNOSIS — I890 Lymphedema, not elsewhere classified: Secondary | ICD-10-CM | POA: Diagnosis not present

## 2018-11-29 DIAGNOSIS — I10 Essential (primary) hypertension: Secondary | ICD-10-CM | POA: Diagnosis not present

## 2018-11-29 DIAGNOSIS — Z96651 Presence of right artificial knee joint: Secondary | ICD-10-CM | POA: Diagnosis not present

## 2018-11-29 DIAGNOSIS — Y831 Surgical operation with implant of artificial internal device as the cause of abnormal reaction of the patient, or of later complication, without mention of misadventure at the time of the procedure: Secondary | ICD-10-CM | POA: Diagnosis present

## 2018-11-29 DIAGNOSIS — Z881 Allergy status to other antibiotic agents status: Secondary | ICD-10-CM | POA: Diagnosis not present

## 2018-11-29 DIAGNOSIS — M65162 Other infective (teno)synovitis, left knee: Secondary | ICD-10-CM | POA: Diagnosis not present

## 2018-11-29 DIAGNOSIS — T8454XA Infection and inflammatory reaction due to internal left knee prosthesis, initial encounter: Secondary | ICD-10-CM | POA: Diagnosis not present

## 2018-11-29 DIAGNOSIS — Z79899 Other long term (current) drug therapy: Secondary | ICD-10-CM | POA: Diagnosis not present

## 2018-11-29 DIAGNOSIS — G609 Hereditary and idiopathic neuropathy, unspecified: Secondary | ICD-10-CM | POA: Diagnosis not present

## 2018-11-29 DIAGNOSIS — Z8639 Personal history of other endocrine, nutritional and metabolic disease: Secondary | ICD-10-CM | POA: Diagnosis not present

## 2018-11-29 LAB — CBC
HCT: 38.4 % (ref 36.0–46.0)
Hemoglobin: 12.6 g/dL (ref 12.0–15.0)
MCH: 30.1 pg (ref 26.0–34.0)
MCHC: 32.8 g/dL (ref 30.0–36.0)
MCV: 91.6 fL (ref 80.0–100.0)
Platelets: 184 10*3/uL (ref 150–400)
RBC: 4.19 MIL/uL (ref 3.87–5.11)
RDW: 14.4 % (ref 11.5–15.5)
WBC: 6.3 10*3/uL (ref 4.0–10.5)
nRBC: 0 % (ref 0.0–0.2)

## 2018-11-29 LAB — BASIC METABOLIC PANEL
Anion gap: 8 (ref 5–15)
BUN: 14 mg/dL (ref 8–23)
CO2: 23 mmol/L (ref 22–32)
Calcium: 8.8 mg/dL — ABNORMAL LOW (ref 8.9–10.3)
Chloride: 103 mmol/L (ref 98–111)
Creatinine, Ser: 0.88 mg/dL (ref 0.44–1.00)
GFR calc Af Amer: >60 mL/min (ref >60)
GFR calc non Af Amer: >60 mL/min (ref >60)
Glucose, Bld: 142 mg/dL — ABNORMAL HIGH (ref 70–99)
Potassium: 4.1 mmol/L (ref 3.5–5.1)
Sodium: 134 mmol/L — ABNORMAL LOW (ref 135–145)

## 2018-11-29 MED ORDER — TIZANIDINE HCL 4 MG PO TABS: 4.0000 mg | ORAL_TABLET | Freq: Four times a day (QID) | ORAL | 0 refills | Status: DC | PRN

## 2018-11-29 MED ORDER — OXYCODONE HCL 5 MG PO TABS: 5.0000 mg | ORAL_TABLET | Freq: Four times a day (QID) | ORAL | 0 refills | Status: DC | PRN

## 2018-11-29 MED ORDER — ASPIRIN 325 MG PO TBEC: 325.0000 mg | DELAYED_RELEASE_TABLET | Freq: Two times a day (BID) | ORAL | 0 refills | Status: DC

## 2018-11-29 NOTE — Care Management Obs Status (Signed)
[facility] NOTIFICATION   Patient Details  Name: Katherine [name] MRN: [id number] Date of Birth: July 21, 1950   Medicare Observation Status Notification Given:  Yes    Leeroy Cha, RN 11/29/2018, 12:16 PM

## 2018-11-29 NOTE — Progress Notes (Signed)
SPORTS MEDICINE AND JOINT REPLACEMENT  Lara Mulch, MD    Carlyon Shadow, PA-C [address], [place], [place]  [address]                             [phone]   PROGRESS NOTE  Subjective:  negative for Chest Pain  negative for Shortness of Breath  negative for Nausea/Vomiting   negative for Calf Pain  negative for Bowel Movement   Tolerating Diet: yes         Patient reports pain as 3 on 0-10 scale.    Objective: Vital signs in last 24 hours:    Patient Vitals for the past 24 hrs:  BP Temp Temp src Pulse Resp SpO2 Height Weight  11/29/18 0525 111/73 98.1 F (36.7 C) - 98 19 98 % - -  11/29/18 0128 (!) 86/61 98.6 F (37 C) Oral (!) 102 19 99 % - -  11/28/18 2139 107/75 97.9 F (36.6 C) - (!) 107 18 100 % - -  11/28/18 2050 - - - (!) 105 16 - - -  11/28/18 1832 (!) 169/87 - - (!) 121 16 100 % - -  11/28/18 1740 119/79 (!) 97.5 F (36.4 C) Oral 98 16 99 % - -  11/28/18 1645 (!) 128/96 97.7 F (36.5 C) Oral (!) 102 16 98 % - -  11/28/18 1530 119/88 (!) 97.5 F (36.4 C) Oral (!) 101 13 94 % - -  11/28/18 1500 (!) 136/92 98 F (36.7 C) - 99 12 96 % - -  11/28/18 1445 125/90 - - 96 15 97 % - -  11/28/18 1430 124/85 98 F (36.7 C) - 94 14 95 % - -  11/28/18 1400 - 97.8 F (36.6 C) - - - - - -  11/28/18 1327 132/89 97.8 F (36.6 C) - 92 15 100 % - -  11/28/18 1130 123/85 - - (!) 114 15 98 % - -  11/28/18 1125 123/86 - - (!) 107 15 97 % - -  11/28/18 1120 117/83 - - (!) 103 17 94 % - -  11/28/18 1115 131/90 - - (!) 102 14 94 % - -  11/28/18 1110 (!) 118/98 - - (!) 101 13 94 % - -  11/28/18 1105 125/84 - - (!) 102 (!) 7 95 % - -  11/28/18 1101 116/85 - - - 15 95 % - -  11/28/18 0915 - - - - - - 5\' 4"  (1.626 m) 116.1 kg  11/28/18 0839 128/78 98 F (36.7 C) Oral (!) 117 18 99 % - -    @flow {1959:LAST@   Intake/Output from previous day:   07/20 0701 - 07/21 0700 In: 3805.7 [P.O.:920; I.V.:2635.7] Out: 1650 [Urine:1600]   Intake/Output this shift:   No  intake/output data recorded.   Intake/Output      07/20 0701 - 07/21 0700 07/21 0701 - 07/22 0700   P.O. 920    I.V. (mL/kg) 2635.7 (22.7)    IV Piggyback 250    Total Intake(mL/kg) 3805.7 (32.8)    Urine (mL/kg/hr) 1600    Blood 50    Total Output 1650    Net +2155.7            LABORATORY DATA: Recent Labs    11/24/18 0849 11/29/18 0259  WBC 3.4* 6.3  HGB 13.6 12.6  HCT 42.5 38.4  PLT 166 184   Recent Labs  11/24/18 0849 11/29/18 0259  NA 139 134*  K 4.2 4.1  CL 101 103  CO2 28 23  BUN 8 14  CREATININE 0.83 0.88  GLUCOSE 128* 142*  CALCIUM 9.8 8.8*   No results found for: INR, PROTIME  Examination:  General appearance: alert, cooperative and no distress Extremities: extremities normal, atraumatic, no cyanosis or edema  Wound Exam: clean, dry, intact   Drainage:  None: wound tissue dry  Motor Exam: Quadriceps and Hamstrings Intact  Sensory Exam: Superficial Peroneal, Deep Peroneal and Tibial normal   Assessment:    1 Day Post-Op  Procedure(s) (LRB): SYNOVECTOMY WITH POLY EXCHANG (Left)  ADDITIONAL DIAGNOSIS:  Active Problems:   S/P revision of total knee     Plan: Physical Therapy as ordered Weight Bearing as Tolerated (WBAT)  DVT Prophylaxis:  Aspirin  DISCHARGE PLAN: Home         Patient's anticipated LOS is less than 2 midnights, meeting these requirements: - Lives within 1 hour of care - Has a competent adult at home to recover with post-op recover - NO history of  - Chronic pain requiring opiods  - Diabetes  - Coronary Artery Disease  - Heart failure  - Heart attack  - Stroke  - DVT/VTE  - Cardiac arrhythmia  - Respiratory Failure/COPD  - Renal failure  - Anemia  - Advanced Liver disease        Donia Ast 11/29/2018, 8:20 AM

## 2018-11-29 NOTE — Evaluation (Signed)
Physical Therapy One Time Evaluation Patient Details Name: Katherine [name] MRN: [id number] DOB: 05-31-1950 Today's Date: 11/29/2018   History of Present Illness  Pt is a 68 year old female with failed left total knee arthrosplasty now s/p Left synovectomy with poly exchange.  Pt with significant PMHx for R knee poly exchange 05/02/18, idiopathic peripheral neuropathy, lymphedema, bil RTC repair  Clinical Impression  Patient evaluated by Physical Therapy with no further acute PT needs identified. All education has been completed and the patient has no further questions.  Pt ambulated in hallway good distance and did not feel she needed to practice one step.  Pt performed LE exercises and provided with HEP handout.  Pt to perform HEP for about a week and then reports f/u with outpatient PT.  PT is signing off. Thank you for this referral.     Follow Up Recommendations Follow surgeon's recommendation for DC plan and follow-up therapies(plans for HEP)    Equipment Recommendations  None recommended by PT    Recommendations for Other Services       Precautions / Restrictions Precautions Precautions: Fall;Knee Restrictions Weight Bearing Restrictions: No Other Position/Activity Restrictions: WBAT      Mobility  Bed Mobility Overal bed mobility: Needs Assistance Bed Mobility: Supine to Sit     Supine to sit: Min guard;HOB elevated        Transfers Overall transfer level: Needs assistance Equipment used: Rolling walker (2 wheeled) Transfers: Sit to/from Stand Sit to Stand: Min guard         General transfer comment: cues for UE and LE positioning  Ambulation/Gait Ambulation/Gait assistance: Min guard Gait Distance (Feet): 200 Feet Assistive device: Rolling walker (2 wheeled) Gait Pattern/deviations: Step-to pattern;Decreased stance time - left;Antalgic;Step-through pattern     General Gait Details: verbal cues for sequence, RW positioning, step  length  Stairs Stairs: (discussed safe sequence, pt with hx multiple ortho sx and did not feel she needed to practice)          Wheelchair Mobility    Modified Rankin (Stroke Patients Only)       Balance                                             Pertinent Vitals/Pain Pain Assessment: 0-10 Pain Score: 2  Pain Location: Left knee Pain Descriptors / Indicators: Aching;Sore Pain Intervention(s): Monitored during session;Repositioned;Ice applied    Home Living Family/patient expects to be discharged to:: Private residence Living Arrangements: Spouse/significant other;Other relatives Available Help at Discharge: Family Type of Home: House Home Access: Stairs to enter   [employer] of Steps: 1 Home Layout: One level Home Equipment: Clinical cytogeneticist - 2 wheels;Cane - single point      Prior Function Level of Independence: Independent               Hand Dominance        Extremity/Trunk Assessment        Lower Extremity Assessment Lower Extremity Assessment: LLE deficits/detail LLE Deficits / Details: able to perform SLR and ankle pumps, L knee AAROM approx 55*       Communication   Communication: No difficulties  Cognition Arousal/Alertness: Awake/alert Behavior During Therapy: WFL for tasks assessed/performed Overall Cognitive Status: Within Functional Limits for tasks assessed  General Comments      Exercises     Assessment/Plan    PT Assessment Patent does not need any further PT services  PT Problem List         PT Treatment Interventions      PT Goals (Current goals can be found in the Care Plan section)  Acute Rehab PT Goals PT Goal Formulation: All assessment and education complete, DC therapy    Frequency     Barriers to discharge        Co-evaluation               AM-PAC PT "6 Clicks" Mobility  Outcome Measure Help needed  turning from your back to your side while in a flat bed without using bedrails?: None Help needed moving from lying on your back to sitting on the side of a flat bed without using bedrails?: A Little Help needed moving to and from a bed to a chair (including a wheelchair)?: A Little Help needed standing up from a chair using your arms (e.g., wheelchair or bedside chair)?: A Little Help needed to walk in hospital room?: A Little Help needed climbing 3-5 steps with a railing? : A Little 6 Click Score: 19    End of Session Equipment Utilized During Treatment: Gait belt Activity Tolerance: Patient tolerated treatment well Patient left: in chair;with call bell/phone within reach Nurse Communication: Mobility status PT Visit Diagnosis: Other abnormalities of gait and mobility (R26.89)    Time: 1040-1103 PT Time Calculation (min) (ACUTE ONLY): 23 min   Charges:   PT Evaluation $PT Eval Low Complexity: 1 Low PT Treatments $Therapeutic Exercise: 8-22 mins       Carmelia Bake, PT, DPT Acute Rehabilitation Services Office: [phone] Pager: [phone]  Trena Platt 11/29/2018, 12:26 PM

## 2018-11-29 NOTE — Plan of Care (Signed)
Plan of care reviewed and discussed with the patient. 

## 2018-11-30 ENCOUNTER — Encounter (HOSPITAL_COMMUNITY): Payer: Self-pay | Admitting: Orthopedic Surgery

## 2018-12-01 NOTE — Discharge Summary (Signed)
SPORTS MEDICINE & JOINT REPLACEMENT   Katherine Mulch, MD   Katherine Shadow, PA-C Katherine [name], [name], [name]  [id number]                             [phone]  PATIENT ID: Katherine [name]        MRN:  [id number]          DOB/AGE: June 16, 1950 / 68 y.o.    DISCHARGE SUMMARY  ADMISSION DATE:    11/28/2018 DISCHARGE DATE:   11/29/2018  ADMISSION DIAGNOSIS: infected left knee    DISCHARGE DIAGNOSIS:  infected left knee    ADDITIONAL DIAGNOSIS: Active Problems:   S/P revision of total knee  Past Medical History:  Diagnosis Date  . Complication of anesthesia     neuropathy in feet flares after surgery from PAS hose. needs cream on feet  . DDD (degenerative disc disease), lumbar 2012  . Dental crowns present   . GERD (gastroesophageal reflux disease)    occ, no medications at this time  . History of anemia 1991   not since hysterectomy  . History of gross hematuria 2002   with kidney stones  . History of hyperparathyroidism   . History of kidney stones   . Hypertension    states under control with meds., has been on med. x 20 yr.  . Impingement syndrome of right shoulder 2017  . Lymphedema of both lower extremities    due to neuropathy  . Lymphedema of both upper extremities    due to neuropathy  . Obese   . Osteoarthritis   . Peripheral neuropathy    hands and feet  . Sciatica   . Triangular fibrocartilage complex tear 06/2017   right  . 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 Wears partial dentures    upper and lower    PROCEDURE: Procedure(s): SYNOVECTOMY WITH POLY [place] on 11/28/2018  CONSULTS:    HISTORY:  See H&P in chart  HOSPITAL COURSE:  Katherine [name] is a 68 y.o. admitted on 11/28/2018 and found to have a diagnosis of infected left knee.  After appropriate laboratory studies were obtained  they were taken to the operating room on 11/28/2018 and underwent Procedure(s): [place].   They were given perioperative  antibiotics:  Anti-infectives (From admission, onward)   Start     Dose/Rate Route Frequency Ordered Stop   11/28/18 1830  ceFAZolin (ANCEF) IVPB 2g/100 mL premix     2 g 200 mL/hr over 30 Minutes Intravenous Every 6 hours 11/28/18 1529 11/29/18 0159   11/28/18 0900  ceFAZolin (ANCEF) IVPB 2g/100 mL premix     2 g 200 mL/hr over 30 Minutes Intravenous On call to O.R. 11/28/18 0350 11/28/18 1223    .  Patient given tranexamic acid IV or topical and exparel intra-operatively.  Tolerated the procedure well.    POD# 1: Vital signs were stable.  Patient denied Chest pain, shortness of breath, or calf pain.  Patient was started on Aspirin twice daily at 8am.  Consults to PT, OT, and care management were made.  The patient was weight bearing as tolerated.  CPM was placed on the operative leg 0-90 degrees for 6-8 hours a day. When out of the CPM, patient was placed in the foam block to achieve full extension. Incentive spirometry was taught.  Dressing was changed.       POD #2, Continued  PT for ambulation and  exercise program.  IV saline locked.  O2 discontinued.    The remainder of the hospital course was dedicated to ambulation and strengthening.   The patient was discharged on 1 day post op in  Good condition.  Blood products given:none  DIAGNOSTIC STUDIES: Recent vital signs: No data found.     Recent laboratory studies: Recent Labs    11/29/18 0259  WBC 6.3  HGB 12.6  HCT 38.4  PLT 184   Recent Labs    11/29/18 0259  NA 134*  K 4.1  CL 103  CO2 23  BUN 14  CREATININE 0.88  GLUCOSE 142*  CALCIUM 8.8*   No results found for: INR, PROTIME   Recent Radiographic Studies :  No results found.  DISCHARGE INSTRUCTIONS: Discharge Instructions    Call MD / Call 911   Complete by: As directed    If you experience chest pain or shortness of breath, CALL 911 and be transported to the hospital emergency room.  If you develope a fever above 101 F, pus (white drainage) or  increased drainage or redness at the wound, or calf pain, call your surgeon's office.   Constipation Prevention   Complete by: As directed    Drink plenty of fluids.  Prune juice may be helpful.  You may use a stool softener, such as Colace (over the counter) 100 mg twice a day.  Use MiraLax (over the counter) for constipation as needed.   Diet - low sodium heart healthy   Complete by: As directed    Discharge instructions   Complete by: As directed    INSTRUCTIONS AFTER JOINT REPLACEMENT   Remove items at home which could result in a fall. This includes throw rugs or furniture in walking pathways ICE to the affected joint every three hours while awake for 30 minutes at a time, for at least the first 3-5 days, and then as needed for pain and swelling.  Continue to use ice for pain and swelling. You may notice swelling that will progress down to the foot and ankle.  This is normal after surgery.  Elevate your leg when you are not up walking on it.   Continue to use the breathing machine you got in the hospital (incentive spirometer) which will help keep your temperature down.  It is common for your temperature to cycle up and down following surgery, especially at night when you are not up moving around and exerting yourself.  The breathing machine keeps your lungs expanded and your temperature down.   DIET:  As you were doing prior to hospitalization, we recommend a well-balanced diet.  DRESSING / WOUND CARE / SHOWERING  Keep the surgical dressing until follow up.  The dressing is water proof, so you can shower without any extra covering.  IF THE DRESSING FALLS OFF or the wound gets wet inside, change the dressing with sterile gauze.  Please use good hand washing techniques before changing the dressing.  Do not use any lotions or creams on the incision until instructed by your surgeon.    ACTIVITY  Increase activity slowly as tolerated, but follow the weight bearing instructions below.   No  driving for 6 weeks or until further direction given by your physician.  You cannot drive while taking narcotics.  No lifting or carrying greater than 10 lbs. until further directed by your surgeon. Avoid periods of inactivity such as sitting longer than an hour when not asleep. This helps prevent blood clots.  You  may return to work once you are authorized by your doctor.     WEIGHT BEARING   Weight bearing as tolerated with assist device (walker, cane, etc) as directed, use it as long as suggested by your surgeon or therapist, typically at least 4-6 weeks.   EXERCISES  Results after joint replacement surgery are often greatly improved when you follow the exercise, range of motion and muscle strengthening exercises prescribed by your doctor. Safety measures are also important to protect the joint from further injury. Any time any of these exercises cause you to have increased pain or swelling, decrease what you are doing until you are comfortable again and then slowly increase them. If you have problems or questions, call your caregiver or physical therapist for advice.   Rehabilitation is important following a joint replacement. After just a few days of immobilization, the muscles of the leg can become weakened and shrink (atrophy).  These exercises are designed to build up the tone and strength of the thigh and leg muscles and to improve motion. Often times heat used for twenty to thirty minutes before working out will loosen up your tissues and help with improving the range of motion but do not use heat for the first two weeks following surgery (sometimes heat can increase post-operative swelling).   These exercises can be done on a training (exercise) mat, on the floor, on a table or on a bed. Use whatever works the best and is most comfortable for you.    Use music or television while you are exercising so that the exercises are a pleasant break in your day. This will make your life better  with the exercises acting as a break in your routine that you can look forward to.   Perform all exercises about fifteen times, three times per day or as directed.  You should exercise both the operative leg and the other leg as well.   Exercises include:   Quad Sets - Tighten up the muscle on the front of the thigh (Quad) and hold for 5-10 seconds.   Straight Leg Raises - With your knee straight (if you were given a brace, keep it on), lift the leg to 60 degrees, hold for 3 seconds, and slowly lower the leg.  Perform this exercise against resistance later as your leg gets stronger.  Leg Slides: Lying on your back, slowly slide your foot toward your buttocks, bending your knee up off the floor (only go as far as is comfortable). Then slowly slide your foot back down until your leg is flat on the floor again.  Angel Wings: Lying on your back spread your legs to the side as far apart as you can without causing discomfort.  Hamstring Strength:  Lying on your back, push your heel against the floor with your leg straight by tightening up the muscles of your buttocks.  Repeat, but this time bend your knee to a comfortable angle, and push your heel against the floor.  You may put a pillow under the heel to make it more comfortable if necessary.   A rehabilitation program following joint replacement surgery can speed recovery and prevent re-injury in the future due to weakened muscles. Contact your doctor or a physical therapist for more information on knee rehabilitation.    CONSTIPATION  Constipation is defined medically as fewer than three stools per week and severe constipation as less than one stool per week.  Even if you have a regular bowel pattern at home,  your normal regimen is likely to be disrupted due to multiple reasons following surgery.  Combination of anesthesia, postoperative narcotics, change in appetite and fluid intake all can affect your bowels.   YOU MUST use at least one of the  following options; they are listed in order of increasing strength to get the job done.  They are all available over the counter, and you may need to use some, POSSIBLY even all of these options:    Drink plenty of fluids (prune juice may be helpful) and high fiber foods Colace 100 mg by mouth twice a day  Senokot for constipation as directed and as needed Dulcolax (bisacodyl), take with full glass of water  Miralax (polyethylene glycol) once or twice a day as needed.  If you have tried all these things and are unable to have a bowel movement in the first 3-4 days after surgery call either your surgeon or your primary doctor.    If you experience loose stools or diarrhea, hold the medications until you stool forms back up.  If your symptoms do not get better within 1 week or if they get worse, check with your doctor.  If you experience "the worst abdominal pain ever" or develop nausea or vomiting, please contact the office immediately for further recommendations for treatment.   ITCHING:  If you experience itching with your medications, try taking only a single pain pill, or even half a pain pill at a time.  You can also use Benadryl over the counter for itching or also to help with sleep.   TED HOSE STOCKINGS:  Use stockings on both legs until for at least 2 weeks or as directed by physician office. They may be removed at night for sleeping.  MEDICATIONS:  See your medication summary on the "After Visit Summary" that nursing will review with you.  You may have some home medications which will be placed on hold until you complete the course of blood thinner medication.  It is important for you to complete the blood thinner medication as prescribed.  PRECAUTIONS:  If you experience chest pain or shortness of breath - call 911 immediately for transfer to the hospital emergency department.   If you develop a fever greater that 101 F, purulent drainage from wound, increased redness or drainage from  wound, foul odor from the wound/dressing, or calf pain - CONTACT YOUR SURGEON.                                                   FOLLOW-UP APPOINTMENTS:  If you do not already have a post-op appointment, please call the office for an appointment to be seen by your surgeon.  Guidelines for how soon to be seen are listed in your "After Visit Summary", but are typically between 1-4 weeks after surgery.  OTHER INSTRUCTIONS:   Knee Replacement:  Do not place pillow under knee, focus on keeping the knee straight while resting. CPM instructions: 0-90 degrees, 2 hours in the morning, 2 hours in the afternoon, and 2 hours in the evening. Place foam block, curve side up under heel at all times except when in CPM or when walking.  DO NOT modify, tear, cut, or change the foam block in any way.  MAKE SURE YOU:  Understand these instructions.  Get help right away if you are not doing  well or get worse.    Thank you for letting us be a part of your medical care team.  It is a privilege we respect greatly.  We hope these instructions will help you stay on track for a fast and full recovery!   Increase activity slowly as tolerated   Complete by: As directed       DISCHARGE MEDICATIONS:   Allergies as of 11/29/2018      Reactions   Erythromycin Other (See Comments)   CHEST PAIN      Medication List    TAKE these medications   aspirin 325 MG EC tablet Take 1 tablet (325 mg total) by mouth 2 (two) times daily.   cycloSPORINE 0.05 % ophthalmic emulsion Commonly known as: RESTASIS Place 1 drop into both eyes 2 (two) times daily.   hydrochlorothiazide 12.5 MG tablet Commonly known as: HYDRODIURIL Take 12.5 mg by mouth daily. Notes to patient: Take tomorrow, 11/30/2018   lidocaine-prilocaine cream Commonly known as: EMLA Apply 3 times daily as needed What changed:   how much to take  how to take this  when to take this  reasons to take this  additional instructions   lisinopril 10 MG  tablet Commonly known as: ZESTRIL Take 1 tablet (10 mg total) by mouth daily. Notes to patient: Take tomorrow, 11/30/2018   Metanx 3-90.314-2-35 MG Caps TAKE 1 CAPSULE BY MOUTH TWO TIMES A DAY What changed:   how much to take  how to take this  when to take this  additional instructions   multivitamin tablet Take 1 tablet by mouth daily.   nortriptyline 25 MG capsule Commonly known as: PAMELOR TAKE 3 CAPSULES BY MOUTH AT BEDTIME   oxyCODONE 5 MG immediate release tablet Commonly known as: Oxy IR/ROXICODONE Take 1-2 tablets (5-10 mg total) by mouth every 6 (six) hours as needed for moderate pain (pain score 4-6).   pregabalin 100 MG capsule Commonly known as: Lyrica Take 1 capsule (100 mg total) by mouth 2 (two) times daily.   tiZANidine 4 MG tablet Commonly known as: ZANAFLEX Take 1 tablet (4 mg total) by mouth every 6 (six) hours as needed.   traMADol 50 MG tablet Commonly known as: ULTRAM Take one to two tablets by mouth every 6 hours if needed for pain, Prescribed by Neurology What changed:   how much to take  how to take this  when to take this  reasons to take this  additional instructions   Vitamin D 50 MCG (2000 UT) tablet Take 2,000 Units by mouth daily.       FOLLOW UP VISIT:    DISPOSITION: HOME VS. SNF  CONDITION:  Good   Donia Ast 12/01/2018, 9:29 AM

## 2018-12-28 ENCOUNTER — Encounter: Payer: Self-pay | Admitting: Family Medicine

## 2018-12-28 ENCOUNTER — Ambulatory Visit (INDEPENDENT_AMBULATORY_CARE_PROVIDER_SITE_OTHER): Payer: Medicare Other | Admitting: Family Medicine

## 2018-12-28 ENCOUNTER — Other Ambulatory Visit: Payer: Self-pay

## 2018-12-28 VITALS — BP 102/64 | HR 120 | Temp 98.2°F | Wt 247.0 lb

## 2018-12-28 DIAGNOSIS — R000 Tachycardia, unspecified: Secondary | ICD-10-CM | POA: Diagnosis not present

## 2018-12-28 DIAGNOSIS — G629 Polyneuropathy, unspecified: Secondary | ICD-10-CM

## 2018-12-28 DIAGNOSIS — R600 Localized edema: Secondary | ICD-10-CM

## 2018-12-28 NOTE — Progress Notes (Signed)
Subjective:    Patient ID: Katherine [name], female    DOB: January 03, 1951, 68 y.o.   MRN: [id number]  No chief complaint on file.   HPI Patient was seen today for ongoing concern.  Pt s/p left knee synovectomy with poly exchange on 7/20. Pt endorses edema and numbness in left leg.  Pt unable to elevate Pt has a history of neuropathy on lyrica 100 mg BID and emla cream.  States having electrical shocks shooting from her foot up her left leg to her hip/thigh.  Also feels the neuropathy is becoming worse.  Seen in the past by [facility].  Pt in PT for her L knee, doing well.  Has 2 more sessions left.  Past Medical History:  Diagnosis Date  . Complication of anesthesia     neuropathy in feet flares after surgery from PAS hose. needs cream on feet  . DDD (degenerative disc disease), lumbar 2012  . Dental crowns present   . GERD (gastroesophageal reflux disease)    occ, no medications at this time  . History of anemia 1991   not since hysterectomy  . History of gross hematuria 2002   with kidney stones  . History of hyperparathyroidism   . History of kidney stones   . Hypertension    states under control with meds., has been on med. x 20 yr.  . Impingement syndrome of right shoulder 2017  . Lymphedema of both lower extremities    due to neuropathy  . Lymphedema of both upper extremities    due to neuropathy  . Obese   . Osteoarthritis   . Peripheral neuropathy    hands and feet  . Sciatica   . Triangular fibrocartilage complex tear 06/2017   right  . 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 Wears partial dentures    upper and lower    Allergies  Allergen Reactions  . Erythromycin Other (See Comments)    CHEST PAIN    ROS General: Denies fever, chills, night sweats, changes in weight, changes in appetite HEENT: Denies headaches, ear pain, changes in vision, rhinorrhea, sore throat CV: Denies CP, palpitations, SOB, orthopnea Pulm: Denies SOB, cough, wheezing GI: Denies  abdominal pain, nausea, vomiting, diarrhea, constipation GU: Denies dysuria, hematuria, frequency, vaginal discharge Msk: Denies joint pains  +LE edema, muscle cramps Neuro: Denies weakness +numbness, tingling Skin: Denies rashes, bruising Psych: Denies depression, anxiety, hallucinations      Objective:    Blood pressure 102/64, pulse (!) 120, temperature 98.2 F (36.8 C), temperature source Oral, weight 247 lb (112 kg), SpO2 98 %.   Gen. Pleasant, well-nourished, in no distress, normal affect   HEENT: [place]/AT, face symmetric, no scleral icterus, PERRLA, nares patent without drainage Lungs: no accessory muscle use, CTAB, no wheezes or rales Cardiovascular: tachycardic, trace pitting edema b/l shins. Musculoskeletal: No deformities, no cyanosis or clubbing, normal tone Neuro:  A&Ox3, CN II-XII intact, slowed gait.  Decreased pin prick sensation b/l LEs.  Able to discern soft sensation. Skin:  Warm, no lesions/ rash.  Well healed incisions of b/l knees   Wt Readings from Last 3 Encounters:  12/28/18 247 lb (112 kg)  11/28/18 256 lb (116.1 kg)  11/24/18 256 lb (116.1 kg)    Lab Results  Component Value Date   WBC 6.3 11/29/2018   HGB 12.6 11/29/2018   HCT 38.4 11/29/2018   PLT 184 11/29/2018   GLUCOSE 142 (H) 11/29/2018   CHOL 198 07/22/2018   TRIG 143.0 07/22/2018  HDL 41.50 07/22/2018   LDLDIRECT 139.1 02/11/2011   LDLCALC 128 (H) 07/22/2018   ALT 30 11/24/2018   AST 54 (H) 11/24/2018   NA 134 (L) 11/29/2018   K 4.1 11/29/2018   CL 103 11/29/2018   CREATININE 0.88 11/29/2018   BUN 14 11/29/2018   CO2 23 11/29/2018   TSH 0.73 09/13/2017   HGBA1C 5.9 (H) 04/26/2018    Assessment/Plan:  Neuropathy  -continue EMLA cream and lyrica 100 mg BID - Plan: Ambulatory referral to Neurology,   Lower extremity edema -mild edema expected as pt s/p knee surgery 7/20 -recent labs reviewed from 7/16 and 7/20 -pt advised to limit sodium intake -elevate feet when able and  consider compression/TED hose -Given precautions for worsening symptoms   Tachycardia  -recheck pulse -typically low 100s-1 teens  F/u prn  Grier Mitts, MD

## 2019-01-25 ENCOUNTER — Other Ambulatory Visit: Payer: Self-pay | Admitting: Family Medicine

## 2019-01-25 NOTE — Telephone Encounter (Signed)
Caller name: Clair Gulling  Relation to pt: [facility] [phone] fax [phone]    Reason for call:     Clair Gulling from [facility] requesting a 90 day please e-scribe L-Methylfolate-Algae-B12-B6 (METANX) 3-90.314-2-35 MG CAPS .

## 2019-01-25 NOTE — Telephone Encounter (Signed)
Requested medication (s) are due for refill today: yes  Requested medication (s) are on the active medication list: yes  Last refill:  05/2018  Future visit scheduled: no  Notes to clinic:  Review for refill 90 day supply   Requested Prescriptions  Pending Prescriptions Disp Refills   L-Methylfolate-Algae-B12-B6 (METANX) 3-90.314-2-35 MG CAPS 180 capsule 2    Sig: TAKE 1 CAPSULE BY MOUTH TWO TIMES A DAY     There is no refill protocol information for this order

## 2019-01-26 MED ORDER — METANX 3-90.314-2-35 MG PO CAPS: ORAL_CAPSULE | ORAL | 2 refills | Status: DC

## 2019-01-28 ENCOUNTER — Encounter: Payer: Self-pay | Admitting: Family Medicine

## 2019-02-09 ENCOUNTER — Other Ambulatory Visit: Payer: Self-pay

## 2019-02-09 ENCOUNTER — Encounter: Payer: Self-pay | Admitting: Neurology

## 2019-02-09 ENCOUNTER — Telehealth: Payer: Self-pay | Admitting: Neurology

## 2019-02-09 ENCOUNTER — Ambulatory Visit (INDEPENDENT_AMBULATORY_CARE_PROVIDER_SITE_OTHER): Payer: Medicare Other | Admitting: Neurology

## 2019-02-09 VITALS — BP 106/66 | HR 58 | Temp 97.6°F | Ht 64.0 in | Wt 253.5 lb

## 2019-02-09 DIAGNOSIS — G6289 Other specified polyneuropathies: Secondary | ICD-10-CM | POA: Diagnosis not present

## 2019-02-09 DIAGNOSIS — G629 Polyneuropathy, unspecified: Secondary | ICD-10-CM | POA: Insufficient documentation

## 2019-02-09 DIAGNOSIS — G8929 Other chronic pain: Secondary | ICD-10-CM | POA: Diagnosis not present

## 2019-02-09 DIAGNOSIS — M5441 Lumbago with sciatica, right side: Secondary | ICD-10-CM

## 2019-02-09 NOTE — Progress Notes (Signed)
PATIENT: Katherine [name] DOB: 1950-06-04  Chief Complaint  Patient presents with  . Peripheral Neuropathy    Reports pain has worsened in her hands, legs and feet. Also, cramps in thighs.  She is currently taking Lyrica 100mg  BID which does help.    Marland Kitchen PCP    Billie Ruddy, MD     HISTORICAL:  Katherine [name] is a 68 year old female, referred by her primary care physician Dr. Grier Mitts for evaluation of progressive bilateral lower and upper extremity paresthesia, initial evaluation was on February 09, 2019.  She has been followed up by our clinic since 2010, she presented with gradual onset bilateral lower extremity paresthesia, starting from bottom of her feet, gradually ascending, now to the level of mid shin level, since 2017, she also has bilateral fingertips paresthesia, electric shocking sensation to her limbs  Since 2018, she had a gradual worsening low back pain, radiating pain to her right leg, she denies bowel and bladder incontinence, she has worsening gait abnormality due to low back pain, bilateral lower extremity paresthesia  Last clinical visit with [place] was in 2017, per record, previous EMG nerve conduction study showed findings consistent with peripheral neuropathy, laboratory evaluation previously failed to demonstrate etiology other than mild elevated A1c of 5.9  Over the years, she has been treated with Lyrica, currently taking 100 mg twice a day, nortriptyline 25 mg 3 tablets every night, and Metanx, lidocaine-prilocaine cream,  Laboratory evaluations in July 2020, BMP showed glucose of A999333, normal CBC, folic acid, vitamin 123456, lipid panel showed elevated LDL 128, cholesterol 198, A1c of 5.9  REVIEW OF SYSTEMS: Full 14 system review of systems performed and notable only for as above All other review of systems were negative.  ALLERGIES: Allergies  Allergen Reactions  . Erythromycin Other (See Comments)    CHEST PAIN    HOME MEDICATIONS: Current  Outpatient Medications  Medication Sig Dispense Refill  . Cholecalciferol (VITAMIN D) 50 MCG (2000 UT) tablet Take 2,000 Units by mouth daily.     . cycloSPORINE (RESTASIS) 0.05 % ophthalmic emulsion Place 1 drop into both eyes 2 (two) times daily.    . hydrochlorothiazide (HYDRODIURIL) 12.5 MG tablet Take 12.5 mg by mouth daily.    Marland Kitchen L-Methylfolate-Algae-B12-B6 (METANX) 3-90.314-2-35 MG CAPS TAKE 1 CAPSULE BY MOUTH TWO TIMES A DAY 180 capsule 2  . lidocaine-prilocaine (EMLA) cream Apply 3 times daily as needed (Patient taking differently: Apply 1 application topically 4 (four) times daily as needed (for flare ups). ) 360 g 4  . lisinopril (PRINIVIL,ZESTRIL) 10 MG tablet Take 1 tablet (10 mg total) by mouth daily. 90 tablet 3  . Multiple Vitamin (MULTIVITAMIN) tablet Take 1 tablet by mouth daily.    . nortriptyline (PAMELOR) 25 MG capsule TAKE 3 CAPSULES BY MOUTH AT BEDTIME (Patient taking differently: Take 75 mg by mouth at bedtime. ) 270 capsule 4  . pregabalin (LYRICA) 100 MG capsule Take 1 capsule (100 mg total) by mouth 2 (two) times daily. 180 capsule 2  . traMADol (ULTRAM) 50 MG tablet Take one to two tablets by mouth every 6 hours if needed for pain, Prescribed by Neurology (Patient taking differently: Take 50-100 mg by mouth every 6 (six) hours as needed for moderate pain. ) 180 tablet 0   No current facility-administered medications for this visit.     PAST MEDICAL HISTORY: Past Medical History:  Diagnosis Date  . Complication of anesthesia     neuropathy in feet flares after surgery from  PAS hose. needs cream on feet  . DDD (degenerative disc disease), lumbar 2012  . Dental crowns present   . GERD (gastroesophageal reflux disease)    occ, no medications at this time  . History of anemia 1991   not since hysterectomy  . History of gross hematuria 2002   with kidney stones  . History of hyperparathyroidism   . History of kidney stones   . Hypertension    states under control  with meds., has been on med. x 20 yr.  . Impingement syndrome of right shoulder 2017  . Lymphedema of both lower extremities    due to neuropathy  . Lymphedema of both upper extremities    due to neuropathy  . Obese   . Osteoarthritis   . Peripheral neuropathy    hands and feet  . Sciatica   . Triangular fibrocartilage complex tear 06/2017   right  . 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 Wears partial dentures    upper and lower    PAST SURGICAL HISTORY: Past Surgical History:  Procedure Laterality Date  . ABDOMINAL HYSTERECTOMY  1991   partial  . CARPAL TUNNEL RELEASE Right 08/09/2002  . CARPAL TUNNEL RELEASE Left 11/03/2006  . COLONOSCOPY  01/2016  . COLONOSCOPY WITH PROPOFOL  11/24/2011  . CYSTOSCOPY WITH RETROGRADE PYELOGRAM, URETEROSCOPY AND STENT PLACEMENT Left 12/18/2014   Procedure: CYSTOSCOPY WITH RETROGRADE PYELOGRAM, URETEROSCOPY AND STENT PLACEMENT left ureter;  Surgeon: Raynelle Bring, MD;  Location: [facility];  Service: Urology;  Laterality: Left;  . DILATION AND CURETTAGE OF UTERUS    . ELBOW ARTHROSCOPY Right   . EXTRACORPOREAL SHOCK WAVE LITHOTRIPSY Left 03/08/2017   Procedure: LEFT EXTRACORPOREAL SHOCK WAVE LITHOTRIPSY (ESWL);  Surgeon: Raynelle Bring, MD;  Location: [facility];  Service: Urology;  Laterality: Left;  . HOLMIUM LASER APPLICATION Left [id number]   Procedure: HOLMIUM LASER APPLICATION left ureter ;  Surgeon: Raynelle Bring, MD;  Location: [facility];  Service: Urology;  Laterality: Left;  . PARATHYROIDECTOMY Right 05/03/2015   Procedure: RIGHT SUPERIOR PARATHYROIDECTOMY;  Surgeon: Armandina Gemma, MD;  Location: [facility];  Service: General;  Laterality: Right;  . PATELLA REALIGNMENT Bilateral    as a teenager  . ROTATOR CUFF REPAIR Right    x2  . SHOULDER ARTHROSCOPY Right    x 2  . SHOULDER ARTHROSCOPY W/ ROTATOR CUFF REPAIR Left 04/26/2002  . SYNOVECTOMY WITH POLY EXCHANGE Left 11/28/2018   Procedure: SYNOVECTOMY WITH Posey Pronto;  Surgeon: Vickey Huger,  MD;  Location: [facility];  Service: Orthopedics;  Laterality: Left;  . TONSILLECTOMY    . TOTAL KNEE ARTHROPLASTY Left 09/10/2003  . TOTAL KNEE ARTHROPLASTY Right 11/10/2004  . TOTAL KNEE ARTHROPLASTY Right 05/02/2018   Procedure: Right polyethelene exchange and open synovectomy;  Surgeon: Vickey Huger, MD;  Location: [facility];  Service: Orthopedics;  Laterality: Right;  . TRIGGER FINGER RELEASE Right 08/06/2008   ring finger  . TRIGGER FINGER RELEASE     multiple - right thumb, left index/long/ring/thumb  . ULNA OSTEOTOMY Right 07/28/2017   Procedure: REVISION RIGHT ULNAR SHORTENING OSTEOTOMY;  Surgeon: Leandrew Koyanagi, MD;  Location: [facility];  Service: Orthopedics;  Laterality: Right;  . WRIST ARTHROSCOPY Left 06/16/2000  . WRIST ARTHROSCOPY WITH ULNA SHORTENING Right 07/07/2017   Procedure: RIGHT WRIST ARTHROSCOPY WITH ULNAR SHORTENING OSTEOTOMY;  Surgeon: Leandrew Koyanagi, MD;  Location: [facility];  Service: Orthopedics;  Laterality: Right;    FAMILY HISTORY: Family History  Problem Relation Age  of Onset  . Stroke Mother   . Cancer Mother 87       bone cancer  . Other Father        unknown medical history  . Diabetes Sister     SOCIAL HISTORY: Social History   Socioeconomic History  . Marital status: Single    Spouse name: Not on file  . Number of children: 1  . Years of education: some college  . Highest education level: Not on file  Occupational History  . Occupation: Retired in 2011    Employer: [employer]  . Financial resource strain: Not on file  . Food insecurity    Worry: Not on file    Inability: Not on file  . Transportation needs    Medical: Not on file    Non-medical: Not on file  Tobacco Use  . Smoking status: Never Smoker  . Smokeless tobacco: Never Used  Substance and Sexual Activity  . Alcohol use: Not Currently  . Drug use: Never  . Sexual activity: Never    Birth control/protection: Surgical  Lifestyle   . Physical activity    Days per week: Not on file    Minutes per session: Not on file  . Stress: Not on file  Relationships  . Social Herbalist on phone: Not on file    Gets together: Not on file    Attends religious service: Not on file    Active member of club or organization: Not on file    Attends meetings of clubs or organizations: Not on file    Relationship status: Not on file  . Intimate partner violence    Fear of current or ex partner: Not on file    Emotionally abused: Not on file    Physically abused: Not on file    Forced sexual activity: Not on file  Other Topics Concern  . Not on file  Social History Narrative   Patient is single and lives with her brother.    Patient is right handed.   Caffeine - no daily use.     PHYSICAL EXAM   Vitals:   02/09/19 0827  BP: 106/66  Pulse: (!) 58  Temp: 97.6 F (36.4 C)  Weight: 253 lb 8 oz (115 kg)  Height: 5\' 4"  (1.626 m)    Not recorded      Body mass index is 43.51 kg/m.  PHYSICAL EXAMNIATION:  Gen: NAD, conversant, well nourised, well groomed                     Cardiovascular: Regular rate rhythm, no peripheral edema, warm, nontender. Eyes: Conjunctivae clear without exudates or hemorrhage Neck: Supple, no carotid bruits. Pulmonary: Clear to auscultation bilaterally   NEUROLOGICAL EXAM:  MENTAL STATUS: Speech:    Speech is normal; fluent and spontaneous with normal comprehension.  Cognition:     Orientation to time, place and person     Normal recent and remote memory     Normal Attention span and concentration     Normal Language, naming, repeating,spontaneous speech     Fund of knowledge   CRANIAL NERVES: CN II: Visual fields are full to confrontation.  Pupils are round equal and briskly reactive to light. CN III, IV, VI: extraocular movement are normal. No ptosis. CN V: Facial sensation is intact to pinprick in all 3 divisions bilaterally. Corneal responses are intact.  CN VII:  Face is symmetric with normal eye  closure and smile. CN VIII: Hearing is normal to causal conversation. CN IX, X: Palate elevates symmetrically. Phonation is normal. CN XI: Head turning and shoulder shrug are intact CN XII: Tongue is midline with normal movements and no atrophy.  MOTOR: Muscle bulk and tone are normal. Muscle strength is normal.  REFLEXES: Reflexes are 1 and symmetric at the biceps, triceps, knees, and ankles. Plantar responses are flexor.  SENSORY: Length dependent decreased to light touch, pinprick, vibratory sensation to mid shin level  COORDINATION: Rapid alternating movements and fine finger movements are intact. There is no dysmetria on finger-to-nose and heel-knee-shin.    GAIT/STANCE: She needs to push up to get up from seated position, mildly antalgic,   DIAGNOSTIC DATA (LABS, IMAGING, TESTING) - I reviewed patient records, labs, notes, testing and imaging myself where available.   ASSESSMENT AND PLAN  Katherine [name] is a 68 y.o. female   Peripheral neuropathy            Repeat EMG nerve conduction study           Laboratory evaluation for potential etiology            Continue current polypharmacy treatment Lyrica 100 twice a day, nortriptyline 25 mg 3 tablets every night, lidocaine-prilocaine cream, Metanx twice a day, tramadol as needed  Worsening low back pain, radiating pain to right lower extremity  MRI of lumbar spine to rule out structural lesion  Marcial Pacas, M.D. Ph.D.  [facility] [address], [place], [place] [address] Ph: [phone] Fax: [phone]  CC: Referring Provider

## 2019-02-09 NOTE — Telephone Encounter (Signed)
[employer] medicare order sent to GI. No auth they will reach out to the patient to schedule.  

## 2019-02-15 ENCOUNTER — Telehealth: Payer: Self-pay | Admitting: Neurology

## 2019-02-15 LAB — PROTEIN ELECTROPHORESIS
A/G Ratio: 1.1 (ref 0.7–1.7)
Albumin ELP: 3.6 g/dL (ref 2.9–4.4)
Alpha 1: 0.2 g/dL (ref 0.0–0.4)
Alpha 2: 0.8 g/dL (ref 0.4–1.0)
Beta: 0.9 g/dL (ref 0.7–1.3)
Gamma Globulin: 1.3 g/dL (ref 0.4–1.8)
Globulin, Total: 3.2 g/dL (ref 2.2–3.9)
Total Protein: 6.8 g/dL (ref 6.0–8.5)

## 2019-02-15 LAB — HEPATITIS PANEL, ACUTE
Hep A IgM: NEGATIVE
Hep B C IgM: NEGATIVE
Hep C Virus Ab: <0.1 s/co ratio (ref 0.0–0.9)
Hepatitis B Surface Ag: NEGATIVE

## 2019-02-15 LAB — TSH: TSH: 1.3 u[IU]/mL (ref 0.450–4.500)

## 2019-02-15 LAB — RPR: RPR Ser Ql: NONREACTIVE

## 2019-02-15 LAB — COPPER, SERUM: Copper: 109 ug/dL (ref 72–166)

## 2019-02-15 LAB — SEDIMENTATION RATE: Sed Rate: 14 mm/hr (ref 0–40)

## 2019-02-15 LAB — HGB A1C W/O EAG: Hgb A1c MFr Bld: 6.4 % — ABNORMAL HIGH (ref 4.8–5.6)

## 2019-02-15 LAB — ANA W/REFLEX IF POSITIVE: Anti Nuclear Antibody (ANA): NEGATIVE

## 2019-02-15 LAB — VITAMIN B1: Thiamine: 135.1 nmol/L (ref 66.5–200.0)

## 2019-02-15 LAB — VITAMIN D 25 HYDROXY (VIT D DEFICIENCY, FRACTURES): Vit D, 25-Hydroxy: 29.6 ng/mL — ABNORMAL LOW (ref 30.0–100.0)

## 2019-02-15 LAB — C-REACTIVE PROTEIN: CRP: 5 mg/L (ref 0–10)

## 2019-02-15 NOTE — Telephone Encounter (Signed)
Please call patient,  Lab evaluations showed A1c 6.4,   Rest of the lab evaluation were normal.

## 2019-02-15 NOTE — Telephone Encounter (Signed)
Pt returning call please call back °

## 2019-02-15 NOTE — Telephone Encounter (Signed)
Staff message was sent to DR. Banks to review pts elevated A1c of 6.4.

## 2019-02-15 NOTE — Telephone Encounter (Signed)
Please call patient, lab evaluation showed mildly elevated A1C 6.4, consistent with the diagnosis of diabetes.  Rest of the laboratory evaluation showed no significant abnormalities.

## 2019-02-15 NOTE — Telephone Encounter (Signed)
Left vm for patient to call back about her lab work results.

## 2019-02-15 NOTE — Telephone Encounter (Signed)
I called pt about her A1c being 6.4 and consistent with the diagnosis with diabetes. The other labs showed no significant abnormalities.THe pt verbalized understanding. I stated a message will be routed to Dr. Grier Mitts to review. The pt verbalized understanding.

## 2019-02-19 ENCOUNTER — Ambulatory Visit
Admission: RE | Admit: 2019-02-19 | Discharge: 2019-02-19 | Disposition: A | Discharge status: Home / Self Care | Payer: Medicare Other | Source: Ambulatory Visit | Attending: Neurology | Admitting: Neurology

## 2019-02-19 ENCOUNTER — Other Ambulatory Visit: Payer: Self-pay

## 2019-02-19 DIAGNOSIS — M5441 Lumbago with sciatica, right side: Secondary | ICD-10-CM

## 2019-02-19 DIAGNOSIS — G8929 Other chronic pain: Secondary | ICD-10-CM

## 2019-02-19 DIAGNOSIS — G6289 Other specified polyneuropathies: Secondary | ICD-10-CM

## 2019-02-21 ENCOUNTER — Telehealth: Payer: Self-pay | Admitting: Neurology

## 2019-02-21 NOTE — Telephone Encounter (Signed)
I spoke to the patient.  She is aware of her MRI results and will keep her pending appt for NCV/EMG on 03/29/2019.

## 2019-02-21 NOTE — Telephone Encounter (Signed)
Please call patient, MRI of lumbar showed multilevel degenerative changes, no significant canal or foraminal narrowing,   IMPRESSION: This MRI of the lumbar spine shows multilevel degenerative changes as detailed above.  The most significant findings are: 1.   At L2-L3, there is mild to moderate spinal stenosis and moderate left lateral recess stenosis with some encroachment on the left L3 nerve root though there is no definite nerve root compression. 2.   At L3-L4, there is borderline spinal stenosis due to degenerative changes but there does not appear to be any nerve root compression. 3.   At L4-L5, there is mild spinal stenosis and moderately severe right foraminal narrowing that could lead to right L4 nerve root compression.

## 2019-02-27 ENCOUNTER — Other Ambulatory Visit: Payer: Self-pay | Admitting: Family Medicine

## 2019-02-28 NOTE — Telephone Encounter (Signed)
Pt LOV was 12/28/2018 and last refill was on 08/25/2018, please advise

## 2019-03-09 ENCOUNTER — Other Ambulatory Visit: Payer: Self-pay

## 2019-03-09 ENCOUNTER — Ambulatory Visit (INDEPENDENT_AMBULATORY_CARE_PROVIDER_SITE_OTHER): Payer: Medicare Other

## 2019-03-09 ENCOUNTER — Encounter: Payer: Self-pay | Admitting: Family Medicine

## 2019-03-09 DIAGNOSIS — Z23 Encounter for immunization: Secondary | ICD-10-CM

## 2019-03-29 ENCOUNTER — Other Ambulatory Visit: Payer: Self-pay

## 2019-03-29 ENCOUNTER — Ambulatory Visit (INDEPENDENT_AMBULATORY_CARE_PROVIDER_SITE_OTHER): Payer: Medicare Other | Admitting: Neurology

## 2019-03-29 DIAGNOSIS — M5441 Lumbago with sciatica, right side: Secondary | ICD-10-CM

## 2019-03-29 DIAGNOSIS — R202 Paresthesia of skin: Secondary | ICD-10-CM | POA: Diagnosis not present

## 2019-03-29 DIAGNOSIS — G6289 Other specified polyneuropathies: Secondary | ICD-10-CM

## 2019-03-29 DIAGNOSIS — G8929 Other chronic pain: Secondary | ICD-10-CM | POA: Diagnosis not present

## 2019-03-29 NOTE — Procedures (Signed)
Full Name: Katherine [name] Gender: Female MRN #: [id number] Date of Birth: 05-Aug-1950    Visit Date: 03/29/2019 09:15 Age: 68 Years 73 Months Old Examining Physician: Marcial Pacas, MD  History:   68 year old female with history of low back pain, bilateral lower extremity paresthesia  Summary of the tests: Nerve conduction study:  Bilateral sural sensory responses showed mildly decreased snap amplitude.  Bilateral superficial peroneal sensory responses were absent.  Left ulnar and radial sensory response showed mildly decreased snap amplitude.  Bilateral tibial motor responses were absent.  Bilateral peroneal to EDB motor responses showed mildly decreased conduction velocity.  Left ulnar motor response was normal  Electromyography:  Selected needle examination of bilateral lower extremity muscles and bilateral lumbosacral paraspinal muscles showed no significant abnormalities.  Conclusion: This is an abnormal study.  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  There is no evidence of bilateral lumbosacral radiculopathy.    ------------------------------- Marcial Pacas, M.D. PhD  [facility] [place], [place] [address] Tel: [phone] Fax: [phone]        [facility]    Nerve / Sites Muscle Latency Ref. Amplitude Ref. Rel Amp Segments Distance Velocity Ref. Area    ms ms mV mV %  cm m/s m/s mVms  L Ulnar - ADM     Wrist ADM 2.4 ?3.3 10.8 ?6.0 100 Wrist - ADM 7   34.7     B.Elbow ADM 6.3  11.1  103 B.Elbow - Wrist 20 52 ?49 35.6     A.Elbow ADM 8.2  10.1  90.6 A.Elbow - B.Elbow 10 53 ?49 33.2         A.Elbow - Wrist      L Peroneal - EDB     Ankle EDB 5.9 ?6.5 4.0 ?2.0 100 Ankle - EDB 9   14.6     Fib head EDB 13.8  2.6  64.3 Fib head - Ankle 31 39 ?44 11.2     Pop fossa EDB 15.3  3.6  142 Pop fossa - Fib head 10 69 ?44 14.6         Pop fossa - Ankle      R Peroneal - EDB     Ankle EDB 5.4 ?6.5 4.5 ?2.0 100  Ankle - EDB 9   15.1     Fib head EDB 13.2  4.0  89.6 Fib head - Ankle 31 40 ?44 14.2     Pop fossa EDB 15.1  3.3  82.3 Pop fossa - Fib head 10 52 ?44 13.3         Pop fossa - Ankle      L Tibial - AH     Ankle AH NR ?5.8 NR ?4.0 NR Ankle - AH 9   NR     Pop fossa AH 17.1  2.6   Pop fossa - Ankle 38 NR ?41 16.0  R Tibial - AH     Ankle AH NR ?5.8 NR ?4.0 NR Ankle - AH 9   NR     Pop fossa AH 16.4  3.5   Pop fossa - Ankle 38 NR ?41 21.7               SNC    Nerve / Sites Rec. Site Peak Lat Ref.  Amp Ref. Segments Distance    ms ms V V  cm  L Radial - Anatomical snuff box (Forearm)     Forearm Wrist 2.2 ?  2.9 9 ?15 Forearm - Wrist 10  L Sural - Ankle (Calf)     Calf Ankle 4.0 ?4.4 4 ?6 Calf - Ankle 14  R Sural - Ankle (Calf)     Calf Ankle 4.1 ?4.4 5 ?6 Calf - Ankle 14  L Superficial peroneal - Ankle     Lat leg Ankle NR ?4.4 NR ?6 Lat leg - Ankle 14  R Superficial peroneal - Ankle     Lat leg Ankle NR ?4.4 NR ?6 Lat leg - Ankle 14  L Ulnar - Orthodromic, (Dig V, Mid palm)     Dig V Wrist 2.5 ?3.1 4 ?5 Dig V - Wrist 87                 F  Wave    Nerve F Lat Ref.   ms ms  L Tibial - AH NR ?56.0  R Tibial - AH NR ?56.0  L Ulnar - ADM 32.4 ?32.0           EMG       EMG Summary Table    Spontaneous MUAP Recruitment  Muscle IA Fib PSW Fasc Other Amp Dur. Poly Pattern  L. Tibialis anterior Normal None None None _______ Normal Normal Normal Normal  L. Tibialis posterior Normal None None None _______ Normal Normal Normal Normal  L. Peroneus longus Normal None None None _______ Normal Normal Normal Normal  L. Gastrocnemius (Medial head) Normal None None None _______ Normal Normal Normal Normal  L. Vastus lateralis Normal None None None _______ Normal Normal Normal Normal  R. Tibialis anterior Normal None None None _______ Normal Normal Normal Normal  R. Tibialis posterior Normal None None None _______ Normal Normal Normal Normal  R. Peroneus longus Normal None None None _______  Normal Normal Normal Normal  R. Gastrocnemius (Medial head) Normal None None None _______ Normal Normal Normal Normal  R. Vastus lateralis Normal None None None _______ Normal Normal Normal Normal  R. Lumbar paraspinals (mid) Normal None None None _______ Normal Normal Normal Normal  R. Lumbar paraspinals (low) Normal None None None _______ Normal Normal Normal Normal  L. Lumbar paraspinals (mid) Normal None None None _______ Normal Normal Normal Normal  L. Lumbar paraspinals (low) Normal None None None _______ Normal Normal Normal Normal

## 2019-03-29 NOTE — Progress Notes (Signed)
PATIENT: Katherine [name] DOB: [birth year]  No chief complaint on file.    HISTORICAL:  Katherine [name] is a 68 year old female, referred by her primary care physician Dr. Grier Mitts for evaluation of progressive bilateral lower and upper extremity paresthesia, initial evaluation was on February 09, 2019.  She has been followed up by our clinic since 2010, she presented with gradual onset bilateral lower extremity paresthesia, starting from bottom of her feet, gradually ascending, now to the level of mid shin level, since 2017, she also has bilateral fingertips paresthesia, electric shocking sensation to her limbs  Since 2018, she had a gradual worsening low back pain, radiating pain to her right leg, she denies bowel and bladder incontinence, she has worsening gait abnormality due to low back pain, bilateral lower extremity paresthesia  Last clinical visit with [place] was in 2017, per record, previous EMG nerve conduction study showed findings consistent with peripheral neuropathy, laboratory evaluation previously failed to demonstrate etiology other than mild elevated A1c of 5.9  Over the years, she has been treated with Lyrica, currently taking 100 mg twice a day, nortriptyline 25 mg 3 tablets every night, and Metanx, lidocaine-prilocaine cream,  Laboratory evaluations in July 2020, BMP showed glucose of 944, normal CBC, folic acid, vitamin H67, lipid panel showed elevated LDL 128, cholesterol 198, A1c of 5.9  UPDATE Mar 29 2019: Patient return for EMG nerve conduction study, which showed evidence of mild axonal sensorimotor polyneuropathy.  I personally reviewed MRI of lumbar spine, multilevel degenerative changes, most severe at L2-3, mild to moderate spinal canal stenosis, moderate left lateral recess stenosis, with some encroachment on the left L3 nerve roots.  Laboratory evaluations in October 2020 showed normal or negative vitamin D, B1, ESR, protein electrophoresis, hepatitis  panel, ANA, copper, TSH, C-reactive protein, RPR, Lyme titer, CBC, mild elevated A1c 6.4, BMP showed mild elevated glucose 142   REVIEW OF SYSTEMS: Full 14 system review of systems performed and notable only for as above All other review of systems were negative.  ALLERGIES: Allergies  Allergen Reactions  . Erythromycin Other (See Comments)    CHEST PAIN    HOME MEDICATIONS: Current Outpatient Medications  Medication Sig Dispense Refill  . Cholecalciferol (VITAMIN D) 50 MCG (2000 UT) tablet Take 2,000 Units by mouth daily.     . cycloSPORINE (RESTASIS) 0.05 % ophthalmic emulsion Place 1 drop into both eyes 2 (two) times daily.    . hydrochlorothiazide (HYDRODIURIL) 12.5 MG tablet Take 12.5 mg by mouth daily.    Marland Kitchen L-Methylfolate-Algae-B12-B6 (METANX) 3-90.314-2-35 MG CAPS TAKE 1 CAPSULE BY MOUTH TWO TIMES A DAY 180 capsule 2  . lidocaine-prilocaine (EMLA) cream Apply 3 times daily as needed (Patient taking differently: Apply 1 application topically 4 (four) times daily as needed (for flare ups). ) 360 g 4  . lisinopril (PRINIVIL,ZESTRIL) 10 MG tablet Take 1 tablet (10 mg total) by mouth daily. 90 tablet 3  . Multiple Vitamin (MULTIVITAMIN) tablet Take 1 tablet by mouth daily.    . nortriptyline (PAMELOR) 25 MG capsule TAKE 3 CAPSULES BY MOUTH AT BEDTIME (Patient taking differently: Take 75 mg by mouth at bedtime. ) 270 capsule 4  . pregabalin (LYRICA) 100 MG capsule TAKE 1 CAPSULE BY MOUTH  TWICE DAILY 180 capsule 1  . traMADol (ULTRAM) 50 MG tablet Take one to two tablets by mouth every 6 hours if needed for pain, Prescribed by Neurology (Patient taking differently: Take 50-100 mg by mouth every 6 (six) hours as needed for moderate  pain. ) 180 tablet 0   No current facility-administered medications for this visit.     PAST MEDICAL HISTORY: Past Medical History:  Diagnosis Date  . Complication of anesthesia     neuropathy in feet flares after surgery from PAS hose. needs cream on  feet  . DDD (degenerative disc disease), lumbar 2012  . Dental crowns present   . GERD (gastroesophageal reflux disease)    occ, no medications at this time  . History of anemia 1991   not since hysterectomy  . History of gross hematuria 2002   with kidney stones  . History of hyperparathyroidism   . History of kidney stones   . Hypertension    states under control with meds., has been on med. x 20 yr.  . Impingement syndrome of right shoulder 2017  . Lymphedema of both lower extremities    due to neuropathy  . Lymphedema of both upper extremities    due to neuropathy  . Obese   . Osteoarthritis   . Peripheral neuropathy    hands and feet  . Sciatica   . Triangular fibrocartilage complex tear 06/2017   right  . 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 Wears partial dentures    upper and lower    PAST SURGICAL HISTORY: Past Surgical History:  Procedure Laterality Date  . ABDOMINAL HYSTERECTOMY  1991   partial  . CARPAL TUNNEL RELEASE Right 08/09/2002  . CARPAL TUNNEL RELEASE Left 11/03/2006  . COLONOSCOPY  01/2016  . COLONOSCOPY WITH PROPOFOL  11/24/2011  . CYSTOSCOPY WITH RETROGRADE PYELOGRAM, URETEROSCOPY AND STENT PLACEMENT Left 12/18/2014   Procedure: CYSTOSCOPY WITH RETROGRADE PYELOGRAM, URETEROSCOPY AND STENT PLACEMENT left ureter;  Surgeon: Raynelle Bring, MD;  Location: [facility];  Service: Urology;  Laterality: Left;  . DILATION AND CURETTAGE OF UTERUS    . ELBOW ARTHROSCOPY Right   . EXTRACORPOREAL SHOCK WAVE LITHOTRIPSY Left 03/08/2017   Procedure: LEFT EXTRACORPOREAL SHOCK WAVE LITHOTRIPSY (ESWL);  Surgeon: Raynelle Bring, MD;  Location: [facility];  Service: Urology;  Laterality: Left;  . HOLMIUM LASER APPLICATION Left 10/16/1273   Procedure: HOLMIUM LASER APPLICATION left ureter ;  Surgeon: Raynelle Bring, MD;  [facility];  Service: Urology;  Laterality: Left;  . PARATHYROIDECTOMY Right 05/03/2015   Procedure: RIGHT SUPERIOR PARATHYROIDECTOMY;  Surgeon: Armandina Gemma, MD;  Location: [place];  Service: General;  Laterality: Right;  . PATELLA REALIGNMENT Bilateral    as a teenager  . ROTATOR CUFF REPAIR Right    x2  . SHOULDER ARTHROSCOPY Right    x 2  . SHOULDER ARTHROSCOPY W/ ROTATOR CUFF REPAIR Left 04/26/2002  . SYNOVECTOMY WITH POLY EXCHANGE Left 11/28/2018   Procedure: SYNOVECTOMY WITH Posey Pronto;  Surgeon: Vickey Huger, MD;  Location: [facility];  Service: Orthopedics;  Laterality: Left;  . TONSILLECTOMY    . TOTAL KNEE ARTHROPLASTY Left 09/10/2003  . TOTAL KNEE ARTHROPLASTY Right 11/10/2004  . TOTAL KNEE ARTHROPLASTY Right 05/02/2018   Procedure: Right polyethelene exchange and open synovectomy;  Surgeon: Vickey Huger, MD;  Location: [facility];  Service: Orthopedics;  Laterality: Right;  . TRIGGER FINGER RELEASE Right 08/06/2008   ring finger  . TRIGGER FINGER RELEASE     multiple - right thumb, left index/long/ring/thumb  . ULNA OSTEOTOMY Right 07/28/2017   Procedure: REVISION RIGHT ULNAR SHORTENING OSTEOTOMY;  Surgeon: Leandrew Koyanagi, MD;  Location: [place];  Service: Orthopedics;  Laterality: Right;  . WRIST ARTHROSCOPY Left 06/16/2000  . WRIST ARTHROSCOPY WITH  ULNA SHORTENING Right 07/07/2017   Procedure: RIGHT WRIST ARTHROSCOPY WITH ULNAR SHORTENING OSTEOTOMY;  Surgeon: Leandrew Koyanagi, MD;  Location: [place];  Service: Orthopedics;  Laterality: Right;    FAMILY HISTORY: Family History  Problem Relation Age of Onset  . Stroke Mother   . Cancer Mother 58       bone cancer  . Other Father        unknown medical history  . Diabetes Sister     SOCIAL HISTORY: Social History   Socioeconomic History  . Marital status: Single    Spouse name: Not on file  . Number of children: 1  . Years of education: some college  . Highest education level: Not on file  Occupational History  . Occupation: Retired in 2011    Employer: [employer]  . Financial resource strain: Not on file  . Food  insecurity    Worry: Not on file    Inability: Not on file  . Transportation needs    Medical: Not on file    Non-medical: Not on file  Tobacco Use  . Smoking status: Never Smoker  . Smokeless tobacco: Never Used  Substance and Sexual Activity  . Alcohol use: Not Currently  . Drug use: Never  . Sexual activity: Never    Birth control/protection: Surgical  Lifestyle  . Physical activity    Days per week: Not on file    Minutes per session: Not on file  . Stress: Not on file  Relationships  . Social Herbalist on phone: Not on file    Gets together: Not on file    Attends religious service: Not on file    Active member of club or organization: Not on file    Attends meetings of clubs or organizations: Not on file    Relationship status: Not on file  . Intimate partner violence    Fear of current or ex partner: Not on file    Emotionally abused: Not on file    Physically abused: Not on file    Forced sexual activity: Not on file  Other Topics Concern  . Not on file  Social History Narrative   Patient is single and lives with her brother.    Patient is right handed.   Caffeine - no daily use.     PHYSICAL EXAM   There were no vitals filed for this visit.  Not recorded      There is no height or weight on file to calculate BMI.  PHYSICAL EXAMNIATION:  Gen: NAD, conversant, well nourised, well groomed                     Cardiovascular: Regular rate rhythm, no peripheral edema, warm, nontender. Eyes: Conjunctivae clear without exudates or hemorrhage Neck: Supple, no carotid bruits. Pulmonary: Clear to auscultation bilaterally   NEUROLOGICAL EXAM:  MENTAL STATUS: Speech:    Speech is normal; fluent and spontaneous with normal comprehension.  Cognition:     Orientation to time, place and person     Normal recent and remote memory     Normal Attention span and concentration     Normal Language, naming, repeating,spontaneous speech     Fund of  knowledge   CRANIAL NERVES: CN II: Visual fields are full to confrontation.  Pupils are round equal and briskly reactive to light. CN III, IV, VI: extraocular movement are normal. No ptosis. CN V: Facial sensation  is intact to pinprick in all 3 divisions bilaterally. Corneal responses are intact.  CN VII: Face is symmetric with normal eye closure and smile. CN VIII: Hearing is normal to causal conversation. CN IX, X: Palate elevates symmetrically. Phonation is normal. CN XI: Head turning and shoulder shrug are intact CN XII: Tongue is midline with normal movements and no atrophy.  MOTOR: Muscle bulk and tone are normal. Muscle strength is normal.  REFLEXES: Reflexes are 1 and symmetric at the biceps, triceps, knees, and ankles. Plantar responses are flexor.  SENSORY: Length dependent decreased to light touch, pinprick, vibratory sensation to mid shin level  COORDINATION: Rapid alternating movements and fine finger movements are intact. There is no dysmetria on finger-to-nose and heel-knee-shin.    GAIT/STANCE: She needs to push up to get up from seated position, mildly antalgic,   DIAGNOSTIC DATA (LABS, IMAGING, TESTING) - I reviewed patient records, labs, notes, testing and imaging myself where available.   ASSESSMENT AND PLAN  Katherine [name] is a 68 y.o. female   Peripheral neuropathy           EMG nerve conduction study confirmed mild axonal peripheral neuropathy,  Laboratory evaluation showed elevated A1c 6.4, no other treatable etiology found            Continue current polypharmacy treatment Lyrica 100 twice a day, nortriptyline 25 mg 3 tablets every night, lidocaine-prilocaine cream, Metanx twice a day, tramadol as needed  Worsening low back pain, radiating pain to right lower extremity  MRI of lumbar spine showed mild to moderate L2-3 canal stenosis, there is no evidence of active lumbar radiculopathy on EMG testing,  Continue moderate exercise,  She denies  significant gait abnormality, no bowel and bladder incontinence, not a surgical candidate.  Katherine [name], M.D. Ph.D.  [facility] [address] [place], [place] [address] Ph: [phone] Fax: [phone]  CC: Referring Provider

## 2019-04-03 ENCOUNTER — Telehealth (INDEPENDENT_AMBULATORY_CARE_PROVIDER_SITE_OTHER): Payer: Medicare Other | Admitting: Family Medicine

## 2019-04-03 DIAGNOSIS — R7303 Prediabetes: Secondary | ICD-10-CM | POA: Diagnosis not present

## 2019-04-03 DIAGNOSIS — E559 Vitamin D deficiency, unspecified: Secondary | ICD-10-CM

## 2019-04-03 DIAGNOSIS — G629 Polyneuropathy, unspecified: Secondary | ICD-10-CM | POA: Diagnosis not present

## 2019-04-03 NOTE — Progress Notes (Signed)
Virtual Visit via Video Note  I connected with Katherine [name] on 04/03/19 at  1:00 PM EST by a video enabled telemedicine application 2/2 [id number] pandemic and verified that I am speaking with the correct person using two identifiers.  Location patient: home Location provider:work or home office Persons participating in the virtual visit: patient, provider  I discussed the limitations of evaluation and management by telemedicine and the availability of in person appointments. The patient expressed understanding and agreed to proceed.   HPI: Pt is a 68 yo female with pmh sig for HTN, Hyperparathyroidism, chronic severe peripheral neruopathy, HLD, h/o nephrolithiasis.  Pt recently seen by Neurolgy for EMG/NCS.  States was told her "numbers" were elevated, but she did not necessarily need medication.  Upon chart review Hgb A1C 6.4% on 02/09/19.  Pt states she got rid of all the cookies and tootsie rolls in the house.  Pt eats a belvita breakfast cracker before going to the gym in the am.  Pt's daughter is cooking for her as her neuropathy makes it hard to cook.  Not eating much meat.  Endorses eating 3 cups of mandarin oranges/day.  Pt eating wheat bread, brown rice, drinking water, and a V8 daily.  Pt eating 2 meals/day.  Denies increased urination, thirst or hunger.  Taking Vit D 2,000 IU daily.  Denies recent prednisone use.  ROS: See pertinent positives and negatives per HPI.  Past Medical History:  Diagnosis Date  . Complication of anesthesia     neuropathy in feet flares after surgery from PAS hose. needs cream on feet  . DDD (degenerative disc disease), lumbar 2012  . Dental crowns present   . GERD (gastroesophageal reflux disease)    occ, no medications at this time  . History of anemia 1991   not since hysterectomy  . History of gross hematuria 2002   with kidney stones  . History of hyperparathyroidism   . History of kidney stones   . Hypertension    states under control with  meds., has been on med. x 20 yr.  . Impingement syndrome of right shoulder 2017  . Lymphedema of both lower extremities    due to neuropathy  . Lymphedema of both upper extremities    due to neuropathy  . Obese   . Osteoarthritis   . Peripheral neuropathy    hands and feet  . Sciatica   . Triangular fibrocartilage complex tear 06/2017   right  . 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 Wears partial dentures    upper and lower    Past Surgical History:  Procedure Laterality Date  . ABDOMINAL HYSTERECTOMY  1991   partial  . CARPAL TUNNEL RELEASE Right 08/09/2002  . CARPAL TUNNEL RELEASE Left 11/03/2006  . COLONOSCOPY  01/2016  . COLONOSCOPY WITH PROPOFOL  11/24/2011  . CYSTOSCOPY WITH RETROGRADE PYELOGRAM, URETEROSCOPY AND STENT PLACEMENT Left 12/18/2014   Procedure: CYSTOSCOPY WITH RETROGRADE PYELOGRAM, URETEROSCOPY AND STENT PLACEMENT left ureter;  Surgeon: Raynelle Bring, MD;  [facility];  Service: Urology;  Laterality: Left;  . DILATION AND CURETTAGE OF UTERUS    . ELBOW ARTHROSCOPY Right   . EXTRACORPOREAL SHOCK WAVE LITHOTRIPSY Left 03/08/2017   Procedure: LEFT EXTRACORPOREAL SHOCK WAVE LITHOTRIPSY (ESWL);  Surgeon: Raynelle Bring, MD;  [facility];  Service: Urology;  Laterality: Left;  . HOLMIUM LASER APPLICATION Left [id number]   Procedure: HOLMIUM LASER APPLICATION left ureter ;  Surgeon: Raynelle Bring, MD;  [facility];  Service: Urology;  Laterality: Left;  . PARATHYROIDECTOMY Right 05/03/2015   Procedure: RIGHT SUPERIOR PARATHYROIDECTOMY;  Surgeon: Armandina Gemma, MD;  Location: [place];  Service: General;  Laterality: Right;  . PATELLA REALIGNMENT Bilateral    as a teenager  . ROTATOR CUFF REPAIR Right    x2  . SHOULDER ARTHROSCOPY Right    x 2  . SHOULDER ARTHROSCOPY W/ ROTATOR CUFF REPAIR Left 04/26/2002  . SYNOVECTOMY WITH POLY EXCHANGE Left 11/28/2018   Procedure: SYNOVECTOMY WITH Posey Pronto;  Surgeon: Vickey Huger, MD;  [facility];   Service: Orthopedics;  Laterality: Left;  . TONSILLECTOMY    . TOTAL KNEE ARTHROPLASTY Left 09/10/2003  . TOTAL KNEE ARTHROPLASTY Right 11/10/2004  . TOTAL KNEE ARTHROPLASTY Right 05/02/2018   Procedure: Right polyethelene exchange and open synovectomy;  Surgeon: Vickey Huger, MD;  [facility];  Service: Orthopedics;  Laterality: Right;  . TRIGGER FINGER RELEASE Right 08/06/2008   ring finger  . TRIGGER FINGER RELEASE     multiple - right thumb, left index/long/ring/thumb  . ULNA OSTEOTOMY Right 07/28/2017   Procedure: REVISION RIGHT ULNAR SHORTENING OSTEOTOMY;  Surgeon: Leandrew Koyanagi, MD;  Location: [place];  Service: Orthopedics;  Laterality: Right;  . WRIST ARTHROSCOPY Left 06/16/2000  . WRIST ARTHROSCOPY WITH ULNA SHORTENING Right 07/07/2017   Procedure: RIGHT WRIST ARTHROSCOPY WITH ULNAR SHORTENING OSTEOTOMY;  Surgeon: Leandrew Koyanagi, MD;  Location: [place];  Service: Orthopedics;  Laterality: Right;    Family History  Problem Relation Age of Onset  . Stroke Mother   . Cancer Mother 60       bone cancer  . Other Father        unknown medical history  . Diabetes Sister      Current Outpatient Medications:  .  Cholecalciferol (VITAMIN D) 50 MCG (2000 UT) tablet, Take 2,000 Units by mouth daily. , Disp: , Rfl:  .  cycloSPORINE (RESTASIS) 0.05 % ophthalmic emulsion, Place 1 drop into both eyes 2 (two) times daily., Disp: , Rfl:  .  hydrochlorothiazide (HYDRODIURIL) 12.5 MG tablet, Take 12.5 mg by mouth daily., Disp: , Rfl:  .  L-Methylfolate-Algae-B12-B6 (METANX) 3-90.314-2-35 MG CAPS, TAKE 1 CAPSULE BY MOUTH TWO TIMES A DAY, Disp: 180 capsule, Rfl: 2 .  lidocaine-prilocaine (EMLA) cream, Apply 3 times daily as needed (Patient taking differently: Apply 1 application topically 4 (four) times daily as needed (for flare ups). ), Disp: 360 g, Rfl: 4 .  lisinopril (PRINIVIL,ZESTRIL) 10 MG tablet, Take 1 tablet (10 mg total) by mouth daily., Disp: 90  tablet, Rfl: 3 .  Multiple Vitamin (MULTIVITAMIN) tablet, Take 1 tablet by mouth daily., Disp: , Rfl:  .  nortriptyline (PAMELOR) 25 MG capsule, TAKE 3 CAPSULES BY MOUTH AT BEDTIME (Patient taking differently: Take 75 mg by mouth at bedtime. ), Disp: 270 capsule, Rfl: 4 .  pregabalin (LYRICA) 100 MG capsule, TAKE 1 CAPSULE BY MOUTH  TWICE DAILY, Disp: 180 capsule, Rfl: 1 .  traMADol (ULTRAM) 50 MG tablet, Take one to two tablets by mouth every 6 hours if needed for pain, Prescribed by Neurology (Patient taking differently: Take 50-100 mg by mouth every 6 (six) hours as needed for moderate pain. ), Disp: 180 tablet, Rfl: 0  EXAM:  VITALS per patient if applicable:  GENERAL: alert, oriented, appears well and in no acute distress  HEENT: atraumatic, conjunctiva clear, no obvious abnormalities on inspection of external nose and ears  NECK: normal movements of the head and neck  LUNGS: on inspection no signs of respiratory distress, breathing rate appears normal, no obvious gross SOB, gasping or wheezing  CV: no obvious cyanosis  MS: moves all visible extremities without noticeable abnormality  PSYCH/NEURO: pleasant and cooperative, no obvious depression or anxiety, speech and thought processing grossly intact  ASSESSMENT AND PLAN:  Discussed the following assessment and plan:  Prediabetes -hgb A1C 6.4% on 02/09/19 -Discussed lifestyle modifications -Discussed weight loss by decreasing calories and continuing exercise. -Discussed portion control, decreasing intake of sweets and carbohydrates. -Given a website with helpful information  -We will follow up in the next few months to reevaluate.  Vitamin D deficiency -Vitamin D 29.6 on 02/09/2019 -Continue OTC ergocalciferol 2000 IUs daily -Patient encouraged to get some fresh care/sun when able -We will continue to monitor  Neuropathy -s/p EMG/NCS were abnormal with evidence of mild axonal sensorimotor neuropathy -Continue follow-up  with [facility], Dr. Marcial Pacas -Continue current medications including EMLA cream 3 times daily, Lyrica 100 mg twice daily  Follow-up as needed in the next 2-3 months, sooner if needed   I discussed the assessment and treatment plan with the patient. The patient was provided an opportunity to ask questions and all were answered. The patient agreed with the plan and demonstrated an understanding of the instructions.   The patient was advised to call back or seek an in-person evaluation if the symptoms worsen or if the condition fails to improve as anticipated.   Billie Ruddy, MD

## 2019-04-10 ENCOUNTER — Other Ambulatory Visit: Payer: Self-pay | Admitting: Family Medicine

## 2019-04-10 MED ORDER — NORTRIPTYLINE HCL 25 MG PO CAPS: 75.0000 mg | ORAL_CAPSULE | Freq: Every day | ORAL | 0 refills | Status: DC

## 2019-04-10 NOTE — Telephone Encounter (Signed)
Copied from [place] [phone]. Topic: Quick Communication - Rx Refill/Question >> Apr 10, 2019  9:57 AM Leward Quan A wrote: Medication: nortriptyline (PAMELOR) 25 MG capsule  Has the patient contacted their pharmacy? Yes.   (Agent: If no, request that the patient contact the pharmacy for the refill.) (Agent: If yes, when and what did the pharmacy advise?)  Preferred Pharmacy (with phone number or street name): [place], [place] [phone] (Phone) [phone] (Fax)    Agent: Please be advised that RX refills may take up to 3 business days. We ask that you follow-up with your pharmacy.

## 2019-05-22 ENCOUNTER — Telehealth: Payer: Self-pay | Admitting: Family Medicine

## 2019-05-22 DIAGNOSIS — G629 Polyneuropathy, unspecified: Secondary | ICD-10-CM

## 2019-05-23 NOTE — Telephone Encounter (Signed)
Pt was advised by her pharmacy to call PCP about the denial of nortriptyline (PAMELOR) 25 MG capsule / Pt advised of request being too early/  Pt states she knows its not to be filled until February and doesn't need any at the moment but her insurance was requesting a new Rx to have on file/ please advise

## 2019-05-23 NOTE — Telephone Encounter (Signed)
Message Routed to PCP CMA 

## 2019-05-24 NOTE — Telephone Encounter (Signed)
Rx is not due for refill

## 2019-06-02 ENCOUNTER — Ambulatory Visit: Payer: Medicare Other

## 2019-06-02 ENCOUNTER — Ambulatory Visit: Payer: Medicare Other | Attending: Internal Medicine

## 2019-06-02 DIAGNOSIS — Z23 Encounter for immunization: Secondary | ICD-10-CM | POA: Insufficient documentation

## 2019-06-05 NOTE — Progress Notes (Signed)
   Covid-19 Vaccination Clinic  Name:  Katherine [name]    MRN: [id number] DOB: Oct 25, 1950  06/02/2019  Ms. Steady was observed post Covid-19 immunization for 15 minutes without incidence. She was provided with Vaccine Information Sheet and instruction to access the V-Safe system.   [name] was instructed to call 911 with any severe reactions post vaccine: Marland Kitchen Difficulty breathing  . Swelling of your face and throat  . A fast heartbeat  . A bad rash all over your body  . Dizziness and weakness    Immunizations Administered    Name Date Dose VIS Date Route   Moderna COVID-19 Vaccine 06/02/2019  9:51 AM 0.5 mL 04/11/2019 Intramuscular   Manufacturer: Moderna   Lot: [id number]   [place]VO:[id number]

## 2019-06-30 ENCOUNTER — Ambulatory Visit: Payer: Medicare Other

## 2019-06-30 ENCOUNTER — Other Ambulatory Visit: Payer: Self-pay | Admitting: Family Medicine

## 2019-06-30 NOTE — Telephone Encounter (Signed)
Pt LOV was 12/28/2018 and last refill was 04/09/2020 for 270 tablets, please advise if ok to refill

## 2019-07-07 ENCOUNTER — Ambulatory Visit: Payer: Medicare Other | Attending: Internal Medicine

## 2019-07-07 DIAGNOSIS — Z23 Encounter for immunization: Secondary | ICD-10-CM | POA: Insufficient documentation

## 2019-07-07 NOTE — Progress Notes (Signed)
   Covid-19 Vaccination Clinic  Name:  Katherine [name]    MRN: [id number] DOB: 1951/02/16  07/07/2019  [name] was observed post Covid-19 immunization for 15 minutes without incidence. She was provided with Vaccine Information Sheet and instruction to access the V-Safe system.   [name] was instructed to call 911 with any severe reactions post vaccine: Marland Kitchen Difficulty breathing  . Swelling of your face and throat  . A fast heartbeat  . A bad rash all over your body  . Dizziness and weakness    Immunizations Administered    Name Date Dose VIS Date Route   Moderna COVID-19 Vaccine 07/07/2019  8:49 AM 0.5 mL 04/11/2019 Intramuscular   Manufacturer: Moderna   Lot: [id number]   [place]VO:[id number]

## 2019-08-09 ENCOUNTER — Other Ambulatory Visit: Payer: Self-pay | Admitting: Family Medicine

## 2019-08-31 ENCOUNTER — Other Ambulatory Visit: Payer: Self-pay | Admitting: Family Medicine

## 2019-09-29 ENCOUNTER — Other Ambulatory Visit: Payer: Self-pay | Admitting: Family Medicine

## 2019-10-20 ENCOUNTER — Telehealth: Payer: Self-pay | Admitting: Family Medicine

## 2019-10-20 ENCOUNTER — Encounter (HOSPITAL_COMMUNITY): Payer: Self-pay

## 2019-10-20 ENCOUNTER — Ambulatory Visit (HOSPITAL_COMMUNITY)
Admission: EM | Admit: 2019-10-20 | Discharge: 2019-10-20 | Disposition: A | Discharge status: Home / Self Care | Payer: Medicare Other | Attending: Family Medicine | Admitting: Family Medicine

## 2019-10-20 ENCOUNTER — Other Ambulatory Visit: Payer: Self-pay

## 2019-10-20 DIAGNOSIS — L720 Epidermal cyst: Secondary | ICD-10-CM

## 2019-10-20 DIAGNOSIS — L0291 Cutaneous abscess, unspecified: Secondary | ICD-10-CM | POA: Diagnosis not present

## 2019-10-20 DIAGNOSIS — L089 Local infection of the skin and subcutaneous tissue, unspecified: Secondary | ICD-10-CM

## 2019-10-20 MED ORDER — SULFAMETHOXAZOLE-TRIMETHOPRIM 800-160 MG PO TABS: 1.0000 | ORAL_TABLET | Freq: Two times a day (BID) | ORAL | 0 refills | Status: AC

## 2019-10-20 NOTE — Discharge Instructions (Addendum)
Take antibiotic 2 times a day Warm compresses to area for 20 minutes every couple of hours Change dressing daily Return if worse at any time instead of better This may continue to drain for a couple of days. It is okay to gently express additional fluid

## 2019-10-20 NOTE — ED Triage Notes (Signed)
Pt c/o inflamed/tender area to upper right back for past several days. Approx 2cm wide area of raised erythema, indurated noted. Pt states small amount of drainage came from site yesterday. Denies fever, n/v/d.

## 2019-10-20 NOTE — Telephone Encounter (Signed)
Patient called stating that she was bit by a spider or insect of some sort on her back.  Her daughter stated it looked like it was spreading on her back.  All providers were booked up so she stated she was going to go to [facility].

## 2019-10-20 NOTE — ED Provider Notes (Signed)
[facility]    CSN: [id number] Arrival date & time: 10/20/19  1428      History   Chief Complaint Chief Complaint  Patient presents with  . Abscess    HPI Katherine [name] is a 69 y.o. female.   HPI  Painful lump on back Is getting larger Her daughter is a Marine scientist and looked at this, told her she needed to be seen No fever or chills No trauma Patient states she is otherwise in pretty good health.  She complains bitterly of her neuropathy, states that it bothers her on a daily basis, and is barely contained.  She apparently deals with a lot of chronic pain and this is difficult for her.  Past Medical History:  Diagnosis Date  . Complication of anesthesia     neuropathy in feet flares after surgery from PAS hose. needs cream on feet  . DDD (degenerative disc disease), lumbar 2012  . Dental crowns present   . GERD (gastroesophageal reflux disease)    occ, no medications at this time  . History of anemia 1991   not since hysterectomy  . History of gross hematuria 2002   with kidney stones  . History of hyperparathyroidism   . History of kidney stones   . Hypertension    states under control with meds., has been on med. x 20 yr.  . Impingement syndrome of right shoulder 2017  . Lymphedema of both lower extremities    due to neuropathy  . Lymphedema of both upper extremities    due to neuropathy  . Obese   . Osteoarthritis   . Peripheral neuropathy    hands and feet  . Sciatica   . Triangular fibrocartilage complex tear 06/2017   right  . 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 Wears partial dentures    upper and lower    Patient Active Problem List   Diagnosis Date Noted  . Peripheral neuropathy 02/09/2019  . Chronic right-sided low back pain with right-sided sciatica 02/09/2019  . 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 Body mass index 40.0-44.9, adult ([place]) 10/22/2017  . Closed displaced oblique  fracture of shaft of ulna with malunion, subsequent encounter 07/28/2017  . 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 Pain in right wrist 06/01/2017  . Trigger finger, right index finger 09/28/2016  . Impaired glucose tolerance 09/09/2015  . Hyperparathyroidism ([place]) 05/26/2013  . Hereditary and idiopathic peripheral neuropathy 12/29/2012  . Morbid obesity ([place])   . Menopausal symptoms 11/10/2011  . DIVERTICULOSIS, COLON 11/02/2007  . Dyslipidemia 03/09/2006  . Essential hypertension 03/09/2006  . NEPHROLITHIASIS, HX OF 03/09/2006    Past Surgical History:  Procedure Laterality Date  . ABDOMINAL HYSTERECTOMY  1991   partial  . CARPAL TUNNEL RELEASE Right 08/09/2002  . CARPAL TUNNEL RELEASE Left 11/03/2006  . COLONOSCOPY  01/2016  . COLONOSCOPY WITH PROPOFOL  11/24/2011  . CYSTOSCOPY WITH RETROGRADE PYELOGRAM, URETEROSCOPY AND STENT PLACEMENT Left 12/18/2014   Procedure: CYSTOSCOPY WITH RETROGRADE PYELOGRAM, URETEROSCOPY AND STENT PLACEMENT left ureter;  Surgeon: Raynelle Bring, MD;  Location: [facility];  Service: Urology;  Laterality: Left;  . DILATION AND CURETTAGE OF UTERUS    . ELBOW ARTHROSCOPY Right   . EXTRACORPOREAL SHOCK WAVE LITHOTRIPSY Left 03/08/2017   Procedure: LEFT EXTRACORPOREAL SHOCK WAVE LITHOTRIPSY (ESWL);  Surgeon: Raynelle Bring, MD;  Location: [facility];  Service: Urology;  Laterality: Left;  .  HOLMIUM LASER APPLICATION Left 12/15/5782   Procedure: HOLMIUM LASER APPLICATION left ureter ;  Surgeon: Raynelle Bring, MD;  Location: [facility];  Service: Urology;  Laterality: Left;  . PARATHYROIDECTOMY Right 05/03/2015   Procedure: RIGHT SUPERIOR PARATHYROIDECTOMY;  Surgeon: Armandina Gemma, MD;  Location: [facility];  Service: General;  Laterality: Right;  . PATELLA REALIGNMENT Bilateral    as a teenager  . ROTATOR CUFF REPAIR Right    x2  . SHOULDER ARTHROSCOPY Right    x 2  . SHOULDER ARTHROSCOPY W/ ROTATOR CUFF REPAIR Left 04/26/2002  .  SYNOVECTOMY WITH POLY EXCHANGE Left 11/28/2018   Procedure: SYNOVECTOMY WITH Posey Pronto;  Surgeon: Vickey Huger, MD;  Location: [facility];  Service: Orthopedics;  Laterality: Left;  . TONSILLECTOMY    . TOTAL KNEE ARTHROPLASTY Left 09/10/2003  . TOTAL KNEE ARTHROPLASTY Right 11/10/2004  . TOTAL KNEE ARTHROPLASTY Right 05/02/2018   Procedure: Right polyethelene exchange and open synovectomy;  Surgeon: Vickey Huger, MD;  Location: [facility];  Service: Orthopedics;  Laterality: Right;  . TRIGGER FINGER RELEASE Right 08/06/2008   ring finger  . TRIGGER FINGER RELEASE     multiple - right thumb, left index/long/ring/thumb  . ULNA OSTEOTOMY Right 07/28/2017   Procedure: REVISION RIGHT ULNAR SHORTENING OSTEOTOMY;  Surgeon: Leandrew Koyanagi, MD;  Location: [facility];  Service: Orthopedics;  Laterality: Right;  . WRIST ARTHROSCOPY Left 06/16/2000  . WRIST ARTHROSCOPY WITH ULNA SHORTENING Right 07/07/2017   Procedure: RIGHT WRIST ARTHROSCOPY WITH ULNAR SHORTENING OSTEOTOMY;  Surgeon: Leandrew Koyanagi, MD;  Location: [facility];  Service: Orthopedics;  Laterality: Right;    OB History    Gravida  1   Para  1   Term      Preterm      AB      Living        SAB      TAB      Ectopic      Multiple      Live Births               Home Medications    Prior to Admission medications   Medication Sig Start Date End Date Taking? Authorizing Provider  Cholecalciferol (VITAMIN D) 50 MCG (2000 UT) tablet Take 2,000 Units by mouth daily.    Yes [provider]  cycloSPORINE (RESTASIS) 0.05 % ophthalmic emulsion Place 1 drop into both eyes 2 (two) times daily.   Yes [provider]  hydrochlorothiazide (HYDRODIURIL) 12.5 MG tablet Take 12.5 mg by mouth daily. 11/16/18  Yes [provider]  L-Methylfolate-Algae-B12-B6 [facility]) 3-90.314-2-35 MG CAPS TAKE 1 CAPSULE BY MOUTH TWO TIMES A DAY 09/29/19  Yes Billie Ruddy, MD  lidocaine-prilocaine  (EMLA) cream APPLY TO AFFECTED AREA(S)  TOPICALLY 1 APPLICATION  THREE TIMES PER DAY AS  NEEDED. 05/23/19  Yes Billie Ruddy, MD  lisinopril (ZESTRIL) 10 MG tablet TAKE 1 TABLET BY MOUTH  DAILY 08/31/19  Yes Billie Ruddy, MD  nortriptyline (PAMELOR) 25 MG capsule TAKE 3 CAPSULES BY MOUTH AT BEDTIME 06/30/19  Yes Billie Ruddy, MD  pregabalin (LYRICA) 100 MG capsule TAKE 1 CAPSULE BY MOUTH  TWICE DAILY 08/09/19  Yes Billie Ruddy, MD  traMADol (ULTRAM) 50 MG tablet Take one to two tablets by mouth every 6 hours if needed for pain, Prescribed by Neurology Patient taking differently: Take 50-100 mg by mouth every 6 (six) hours as needed for moderate pain.  08/25/18  Yes  Billie Ruddy, MD  Multiple Vitamin (MULTIVITAMIN) tablet Take 1 tablet by mouth daily.    [provider]  sulfamethoxazole-trimethoprim (BACTRIM DS) 800-160 MG tablet Take 1 tablet by mouth 2 (two) times daily for 7 days. 10/20/19 10/27/19  Raylene Everts, MD    Family History Family History  Problem Relation Age of Onset  . Stroke Mother   . Cancer Mother 63       bone cancer  . Other Father        unknown medical history  . Diabetes Sister     Social History Social History   Tobacco Use  . Smoking status: Never Smoker  . Smokeless tobacco: Never Used  Vaping Use  . Vaping Use: Never used  Substance Use Topics  . Alcohol use: Not Currently  . Drug use: Never     Allergies   Erythromycin   Review of Systems Review of Systems  Skin: Positive for wound.     Physical Exam Triage Vital Signs ED Triage Vitals  Enc Vitals Group     BP 10/20/19 1525 112/76     Pulse Rate 10/20/19 1525 95     Resp 10/20/19 1525 18     Temp 10/20/19 1525 98.7 F (37.1 C)     Temp Source 10/20/19 1525 Oral     SpO2 10/20/19 1525 100 %     Weight --      Height --      Head Circumference --      Peak Flow --      Pain Score 10/20/19 1522 5     Pain Loc --      Pain Edu? --      Excl. in [place]? --      No data found.  Updated Vital Signs BP 112/76 (BP Location: Right Arm)   Pulse 95   Temp 98.7 F (37.1 C) (Oral)   Resp 18   SpO2 100%   Visual Acuity Right Eye Distance:   Left Eye Distance:   Bilateral Distance:    Right Eye Near:   Left Eye Near:    Bilateral Near:     Physical Exam Constitutional:      General: She is not in acute distress.    Appearance: She is well-developed.     Comments: Appears uncomfortable  HENT:     Head: Normocephalic and atraumatic.     Mouth/Throat:     Comments: Mask is in place Eyes:     Conjunctiva/sclera: Conjunctivae normal.     Pupils: Pupils are equal, round, and reactive to light.  Cardiovascular:     Rate and Rhythm: Normal rate.  Pulmonary:     Effort: Pulmonary effort is normal. No respiratory distress.  Abdominal:     General: There is no distension.     Palpations: Abdomen is soft.  Musculoskeletal:        General: Normal range of motion.       Arms:     Cervical back: Normal range of motion.     Comments: Centrally there is a 3 cm indurated region with about 5 cm of erythema surrounding.  Fluctuant  Skin:    General: Skin is warm and dry.  Neurological:     Mental Status: She is alert.  Psychiatric:        Mood and Affect: Mood normal.        Behavior: Behavior normal.      UC Treatments / Results  Labs (  all labs ordered are listed, but only abnormal results are displayed) Labs Reviewed - No data to display  EKG   Radiology No results found.  Procedures Incision and Drainage  Date/Time: 10/20/2019 8:50 PM Performed by: Raylene Everts, MD Authorized by: Raylene Everts, MD   Consent:    Consent obtained:  Verbal   Consent given by:  Patient   Risks discussed:  Incomplete drainage   Alternatives discussed:  No treatment Location:    Type:  Abscess   Location:  Trunk   Trunk location:  Back Pre-procedure details:    Skin preparation:  Antiseptic wash Anesthesia (see MAR for exact  dosages):    Anesthesia method:  Local infiltration   Local anesthetic:  Lidocaine 2% w/o epi Procedure type:    Complexity:  Simple Procedure details:    Needle aspiration: no     Incision types:  Stab incision   Incision depth:  Subcutaneous   Scalpel blade:  11   Wound management:  Probed and deloculated   Drainage:  Serosanguinous   Drainage amount:  Moderate   Wound treatment:  Wound left open Post-procedure details:    Patient tolerance of procedure:  Tolerated well, no immediate complications Comments:     The contents of the cyst were largely thick sebum with scant amount of purulence   (including critical care time)  Medications Ordered in UC Medications - No data to display  Initial Impression / Assessment and Plan / UC Course  I have reviewed the triage vital signs and the nursing notes.  Pertinent labs & imaging results that were available during my care of the patient were reviewed by me and considered in my medical decision making (see chart for details).      Final Clinical Impressions(s) / UC Diagnoses   Final diagnoses:  Infected epithelial inclusion cyst  Abscess     Discharge Instructions     Take antibiotic 2 times a day Warm compresses to area for 20 minutes every couple of hours Change dressing daily Return if worse at any time instead of better This may continue to drain for a couple of days. It is okay to gently express additional fluid   ED Prescriptions    Medication Sig Dispense Auth. Provider   sulfamethoxazole-trimethoprim (BACTRIM DS) 800-160 MG tablet Take 1 tablet by mouth 2 (two) times daily for 7 days. 14 tablet Raylene Everts, MD     PDMP not reviewed this encounter.   Raylene Everts, MD 10/20/19 2051

## 2019-11-02 ENCOUNTER — Other Ambulatory Visit: Payer: Self-pay

## 2019-11-03 ENCOUNTER — Encounter: Payer: Self-pay | Admitting: Family Medicine

## 2019-11-03 ENCOUNTER — Ambulatory Visit (INDEPENDENT_AMBULATORY_CARE_PROVIDER_SITE_OTHER): Payer: Medicare Other | Admitting: Family Medicine

## 2019-11-03 VITALS — BP 98/68 | HR 95 | Temp 97.8°F | Wt 218.0 lb

## 2019-11-03 DIAGNOSIS — L089 Local infection of the skin and subcutaneous tissue, unspecified: Secondary | ICD-10-CM | POA: Diagnosis not present

## 2019-11-03 DIAGNOSIS — L720 Epidermal cyst: Secondary | ICD-10-CM | POA: Diagnosis not present

## 2019-11-03 DIAGNOSIS — R7303 Prediabetes: Secondary | ICD-10-CM | POA: Diagnosis not present

## 2019-11-03 LAB — HEMOGLOBIN A1C: Hgb A1c MFr Bld: 5.7 % (ref 4.6–6.5)

## 2019-11-06 ENCOUNTER — Encounter: Payer: Self-pay | Admitting: Family Medicine

## 2019-11-06 DIAGNOSIS — R7303 Prediabetes: Secondary | ICD-10-CM | POA: Insufficient documentation

## 2019-11-06 NOTE — Progress Notes (Signed)
Subjective:    Patient ID: Katherine [name], female    DOB: [birth year], 69 y.o.   MRN: [id number]  No chief complaint on file.   HPI Pt is a 69 yo female with pmh sig for severe idiopathic peripheral neuropathy, trigger finger, HTN, pre DM, HLD, s/p TKR  And revision, diverticulosis who was seen for f/u.  Pt went to [facility] 10/20/19 for infected epithelial inclusion cyst on R upper back which required I&D.  Pt completed 7 day course of Bactrim DS, but wanted the area checked to make sure it was healing.  Area has not had much drainage.  Pt denies pain, fever, chills, n/v.  Pt inquires about her hgb A1C.  Pt lost weight since last ofv.  Pt was 253 lbs on 02/09/19.  States followed the advice given by this provider in regards to portion sizes and limiting fruit intake.  Pt inquires if anything else can be done to treat her neuropathy.  Using EMLA cream, lyrica, and nortriptyline.  Past Medical History:  Diagnosis Date  . Complication of anesthesia     neuropathy in feet flares after surgery from PAS hose. needs cream on feet  . DDD (degenerative disc disease), lumbar 2012  . Dental crowns present   . GERD (gastroesophageal reflux disease)    occ, no medications at this time  . History of anemia 1991   not since hysterectomy  . History of gross hematuria 2002   with kidney stones  . History of hyperparathyroidism   . History of kidney stones   . Hypertension    states under control with meds., has been on med. x 20 yr.  . Impingement syndrome of right shoulder 2017  . Lymphedema of both lower extremities    due to neuropathy  . Lymphedema of both upper extremities    due to neuropathy  . Obese   . Osteoarthritis   . Peripheral neuropathy    hands and feet  . Sciatica   . Triangular fibrocartilage complex tear 06/2017   right  . 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 Wears partial dentures    upper and lower    Allergies  Allergen Reactions  . Erythromycin Other (See Comments)     CHEST PAIN    ROS General: Denies fever, chills, night sweats, changes in weight, changes in appetite HEENT: Denies headaches, ear pain, changes in vision, rhinorrhea, sore throat CV: Denies CP, palpitations, SOB, orthopnea Pulm: Denies SOB, cough, wheezing GI: Denies abdominal pain, nausea, vomiting, diarrhea, constipation GU: Denies dysuria, hematuria, frequency, vaginal discharge Msk: Denies muscle cramps, joint pains Neuro: Denies weakness, numbness, tingling Skin: Denies rashes, bruising  +infected cyst on R upper back/shoulder s/p I&D Psych: Denies depression, anxiety, hallucinations    Objective:    Blood pressure 98/68, pulse 95, temperature 97.8 F (36.6 C), temperature source Temporal, weight 218 lb (98.9 kg), SpO2 97 %.  Gen. Pleasant, well-nourished, in no distress, normal affect   HEENT: [place]/AT, face symmetric, no scleral icterus, PERRLA, EOMI, nares patent without drainage Lungs: no accessory muscle use Cardiovascular: RRR, no peripheral edema Musculoskeletal: No deformities, no cyanosis or clubbing, normal tone Neuro:  A&Ox3, CN II-XII intact, normal gait Skin:  Warm, dry.  R upper back/shoulder with small incision open, healing, with minimal drainage expressed, no erythema or induration.  Wt Readings from Last 3 Encounters:  11/03/19 218 lb (98.9 kg)  02/09/19 253 lb 8 oz (115 kg)  12/28/18 247 lb (112 kg)    Lab Results  Component Value Date   WBC 6.3 11/29/2018   HGB 12.6 11/29/2018   HCT 38.4 11/29/2018   PLT 184 11/29/2018   GLUCOSE 142 (H) 11/29/2018   CHOL 198 07/22/2018   TRIG 143.0 07/22/2018   HDL 41.50 07/22/2018   LDLDIRECT 139.1 02/11/2011   LDLCALC 128 (H) 07/22/2018   ALT 30 11/24/2018   AST 54 (H) 11/24/2018   NA 134 (L) 11/29/2018   K 4.1 11/29/2018   CL 103 11/29/2018   CREATININE 0.88 11/29/2018   BUN 14 11/29/2018   CO2 23 11/29/2018   TSH 1.300 02/09/2019   HGBA1C 5.7 11/03/2019    Assessment/Plan:  Infected epithelial  inclusion cyst -s/p I&D -healing  -continue keeping clean and dry -given precautions  Prediabetes  -last hgb A1C 6.4% on 02/09/19 -pt congratulated on wt loss and lifestyle modifications since last visit. - Plan: Hemoglobin A1c  F/u in 3-4 months, sooner if needed.  Grier Mitts, MD

## 2019-11-09 ENCOUNTER — Other Ambulatory Visit: Payer: Self-pay | Admitting: Family Medicine

## 2019-11-20 ENCOUNTER — Other Ambulatory Visit: Payer: Self-pay | Admitting: Family Medicine

## 2019-11-20 DIAGNOSIS — G629 Polyneuropathy, unspecified: Secondary | ICD-10-CM

## 2019-11-21 LAB — HM MAMMOGRAPHY

## 2019-11-23 ENCOUNTER — Encounter: Payer: Self-pay | Admitting: Family Medicine

## 2019-11-28 ENCOUNTER — Telehealth: Payer: Self-pay | Admitting: Family Medicine

## 2019-11-28 NOTE — Telephone Encounter (Signed)
Left message for patient to schedule Annual Wellness Visit.  Please schedule with Nurse Health Advisor Shannon Crews, RN at [facility]  

## 2019-11-28 NOTE — Telephone Encounter (Signed)
Pt had lab work done 3 weeks ago and would like to know if the results are available? Thanks

## 2019-12-01 NOTE — Telephone Encounter (Signed)
Only lab done with hemoglobin A1c which was 5.7%.

## 2019-12-04 NOTE — Telephone Encounter (Signed)
Spoke with the pt and informed her of the results.

## 2019-12-28 ENCOUNTER — Other Ambulatory Visit: Payer: Self-pay

## 2019-12-28 ENCOUNTER — Ambulatory Visit (INDEPENDENT_AMBULATORY_CARE_PROVIDER_SITE_OTHER): Payer: Medicare Other

## 2019-12-28 DIAGNOSIS — Z0000 Encounter for general adult medical examination without abnormal findings: Secondary | ICD-10-CM | POA: Diagnosis not present

## 2019-12-28 NOTE — Progress Notes (Signed)
Subjective:   Katherine [name] is a 69 y.o. female who presents for Medicare Annual (Subsequent) preventive examination.   I connected with Katherine [name] today by telephone and verified that I am speaking with the correct person using two identifiers. Location patient: home Location provider: work Persons participating in the virtual visit: patient, provider.   I discussed the limitations, risks, security and privacy concerns of performing an evaluation and management service by telephone and the availability of in person appointments. I also discussed with the patient that there may be a patient responsible charge related to this service. The patient expressed understanding and verbally consented to this telephonic visit.    Interactive audio and video telecommunications were attempted between this provider and patient, however failed, due to patient having technical difficulties OR patient did not have access to video capability.  We continued and completed visit with audio only.     Review of Systems    N/A Cardiac Risk Factors include: advanced age (>77men, >86 women);dyslipidemia;hypertension     Objective:    Today's Vitals   There is no height or weight on file to calculate BMI.  Advanced Directives 12/28/2019 11/28/2018 05/02/2018 04/26/2018 07/28/2017 07/23/2017 07/02/2017  Does Patient Have a Medical Advance Directive? Yes No Yes Yes No No No  Type of Paramedic of [place];Living will - [place];Living will [place];Living will - - -  Does patient want to make changes to medical advance directive? No - Patient declined - No - Patient declined No - Patient declined - - -  Copy of [place] in Chart? Yes - validated most recent copy scanned in chart (See row information) - Yes - validated most recent copy scanned in chart (See row information) Yes - validated most recent copy scanned in chart  (See row information) - - -  Would patient like information on creating a medical advance directive? - No - Patient declined - - No - Patient declined - No - Patient declined    Current Medications (verified) Outpatient Encounter Medications as of 12/28/2019  Medication Sig  . Cholecalciferol (VITAMIN D) 50 MCG (2000 UT) tablet Take 2,000 Units by mouth daily.   . cycloSPORINE (RESTASIS) 0.05 % ophthalmic emulsion Place 1 drop into both eyes 2 (two) times daily.  . hydrochlorothiazide (HYDRODIURIL) 12.5 MG tablet Take 12.5 mg by mouth daily.  Marland Kitchen L-Methylfolate-Algae-B12-B6 (METANX) 3-90.314-2-35 MG CAPS TAKE 1 CAPSULE BY MOUTH TWO TIMES A DAY  . lidocaine-prilocaine (EMLA) cream APPLY 1 APPLICATION TO  AFFECTED AREA(S) TOPICALLY  3 TIMES DAILY AS NEEDED  . lisinopril (ZESTRIL) 10 MG tablet TAKE 1 TABLET BY MOUTH  DAILY  . Multiple Vitamin (MULTIVITAMIN) tablet Take 1 tablet by mouth daily.  . nortriptyline (PAMELOR) 25 MG capsule TAKE 3 CAPSULES BY MOUTH AT BEDTIME  . pregabalin (LYRICA) 100 MG capsule TAKE 1 CAPSULE BY MOUTH  TWICE DAILY  . traMADol (ULTRAM) 50 MG tablet Take one to two tablets by mouth every 6 hours if needed for pain, Prescribed by Neurology (Patient taking differently: Take 50-100 mg by mouth every 6 (six) hours as needed for moderate pain. )   No facility-administered encounter medications on file as of 12/28/2019.    Allergies (verified) Erythromycin   History: Past Medical History:  Diagnosis Date  . Complication of anesthesia     neuropathy in feet flares after surgery from PAS hose. needs cream on feet  . DDD (degenerative disc disease), lumbar 2012  .  Dental crowns present   . GERD (gastroesophageal reflux disease)    occ, no medications at this time  . History of anemia 1991   not since hysterectomy  . History of gross hematuria 2002   with kidney stones  . History of hyperparathyroidism   . History of kidney stones   . Hypertension    states under  control with meds., has been on med. x 20 yr.  . Impingement syndrome of right shoulder 2017  . Lymphedema of both lower extremities    due to neuropathy  . Lymphedema of both upper extremities    due to neuropathy  . Obese   . Osteoarthritis   . Peripheral neuropathy    hands and feet  . Sciatica   . Triangular fibrocartilage complex tear 06/2017   right  . 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 Wears partial dentures    upper and lower   Past Surgical History:  Procedure Laterality Date  . ABDOMINAL HYSTERECTOMY  1991   partial  . CARPAL TUNNEL RELEASE Right 08/09/2002  . CARPAL TUNNEL RELEASE Left 11/03/2006  . COLONOSCOPY  01/2016  . COLONOSCOPY WITH PROPOFOL  11/24/2011  . CYSTOSCOPY WITH RETROGRADE PYELOGRAM, URETEROSCOPY AND STENT PLACEMENT Left 12/18/2014   Procedure: CYSTOSCOPY WITH RETROGRADE PYELOGRAM, URETEROSCOPY AND STENT PLACEMENT left ureter;  Surgeon: Raynelle Bring, MD;  Location: [facility];  Service: Urology;  Laterality: Left;  . DILATION AND CURETTAGE OF UTERUS    . ELBOW ARTHROSCOPY Right   . EXTRACORPOREAL SHOCK WAVE LITHOTRIPSY Left 03/08/2017   Procedure: LEFT EXTRACORPOREAL SHOCK WAVE LITHOTRIPSY (ESWL);  Surgeon: Raynelle Bring, MD;  Location: [facility];  Service: Urology;  Laterality: Left;  . HOLMIUM LASER APPLICATION Left 06/15/3662   Procedure: HOLMIUM LASER APPLICATION left ureter ;  Surgeon: Raynelle Bring, MD;  Location: [facility];  Service: Urology;  Laterality: Left;  . PARATHYROIDECTOMY Right 05/03/2015   Procedure: RIGHT SUPERIOR PARATHYROIDECTOMY;  Surgeon: Armandina Gemma, MD;  Location: [facility];  Service: General;  Laterality: Right;  . PATELLA REALIGNMENT Bilateral    as a teenager  . ROTATOR CUFF REPAIR Right    x2  . SHOULDER ARTHROSCOPY Right    x 2  . SHOULDER ARTHROSCOPY W/ ROTATOR CUFF REPAIR Left 04/26/2002  . SYNOVECTOMY WITH POLY EXCHANGE Left 11/28/2018   Procedure: SYNOVECTOMY WITH Posey Pronto;  Surgeon: Vickey Huger, MD;  Location: [facility];  Service: Orthopedics;  Laterality: Left;  . TONSILLECTOMY    . TOTAL KNEE ARTHROPLASTY Left 09/10/2003  . TOTAL KNEE ARTHROPLASTY Right 11/10/2004  . TOTAL KNEE ARTHROPLASTY Right 05/02/2018   Procedure: Right polyethelene exchange and open synovectomy;  Surgeon: Vickey Huger, MD;  Location: [facility];  Service: Orthopedics;  Laterality: Right;  . TRIGGER FINGER RELEASE Right 08/06/2008   ring finger  . TRIGGER FINGER RELEASE     multiple - right thumb, left index/long/ring/thumb  . ULNA OSTEOTOMY Right 07/28/2017   Procedure: REVISION RIGHT ULNAR SHORTENING OSTEOTOMY;  Surgeon: Leandrew Koyanagi, MD;  Location: Home;  Service: Orthopedics;  Laterality: Right;  . WRIST ARTHROSCOPY Left 06/16/2000  . WRIST ARTHROSCOPY WITH ULNA SHORTENING Right 07/07/2017   Procedure: RIGHT WRIST ARTHROSCOPY WITH ULNAR SHORTENING OSTEOTOMY;  Surgeon: Leandrew Koyanagi, MD;  Location: [facility];  Service: Orthopedics;  Laterality: Right;   Family History  Problem Relation Age of Onset  . Stroke Mother   . Cancer Mother 85       bone cancer  . Other  Father        unknown medical history  . Diabetes Sister    Social History   Socioeconomic History  . Marital status: Single    Spouse name: Not on file  . Number of children: 1  . Years of education: some college  . Highest education level: Not on file  Occupational History  . Occupation: Retired in 2011    Employer: [employer]  Tobacco Use  . Smoking status: Never Smoker  . Smokeless tobacco: Never Used  Vaping Use  . Vaping Use: Never used  Substance and Sexual Activity  . Alcohol use: Not Currently  . Drug use: Never  . Sexual activity: Never    Birth control/protection: Surgical  Other Topics Concern  . Not on file  Social History Narrative   Patient is single and lives with her brother.    Patient is right handed.   Caffeine - no daily use.   Social Determinants of Health   Financial Resource Strain:  Low Risk   . Difficulty of Paying Living Expenses: Not hard at all  Food Insecurity: No Food Insecurity  . Worried About Charity fundraiser in the Last Year: Never true  . Ran Out of Food in the Last Year: Never true  Transportation Needs: No Transportation Needs  . Lack of Transportation (Medical): No  . Lack of Transportation (Non-Medical): No  Physical Activity: Sufficiently Active  . Days of Exercise per Week: 3 days  . Minutes of Exercise per Session: 60 min  Stress: No Stress Concern Present  . Feeling of Stress : Not at all  Social Connections: Moderately Integrated  . Frequency of Communication with Friends and Family: More than three times a week  . Frequency of Social Gatherings with Friends and Family: Once a week  . Attends Religious Services: More than 4 times per year  . Active Member of Clubs or Organizations: Yes  . Attends Archivist Meetings: More than 4 times per year  . Marital Status: Never married    Tobacco Counseling Counseling given: Not Answered   Clinical Intake:  Pre-visit preparation completed: Yes  Pain : No/denies pain     Nutritional Risks: None Diabetes: No  How often do you need to have someone help you when you read instructions, pamphlets, or other written materials from your doctor or pharmacy?: 1 - Never What is the last grade level you completed in school?: Training School  Diabetic?No  Interpreter Needed?: No  Information entered by :: [place] of Daily Living In your present state of health, do you have any difficulty performing the following activities: 12/28/2019  Hearing? N  Vision? Y  Comment Has some difficulties seeing in left eye even glasses due to cataracts  Difficulty concentrating or making decisions? N  Walking or climbing stairs? Y  Comment Has knee pain, and needs to hold on to rails because if not she states she will fall  Dressing or bathing? N  Doing errands, shopping? N    Preparing Food and eating ? N  Using the Toilet? N  In the past six months, have you accidently leaked urine? Y  Comment Has some occassional urine leakage if she hold urine too long  Do you have problems with loss of bowel control? N  Managing your Medications? N  Managing your Finances? N  Housekeeping or managing your Housekeeping? N  Some recent data might be hidden    Patient Care Team: Grier Mitts  R, MD as PCP - General (Family Medicine)  Indicate any recent Medical Services you may have received from other than [facility] providers in the past year (date may be approximate).     Assessment:   This is a routine wellness examination for [place].  Hearing/Vision screen  Hearing Screening   125Hz  250Hz  500Hz  1000Hz  2000Hz  3000Hz  4000Hz  6000Hz  8000Hz   Right ear:           Left ear:           Vision Screening Comments: Patient states gets eye checked annually  Dietary issues and exercise activities discussed: Current Exercise Habits: Home exercise routine, Type of exercise: Other - see comments (Swimming), Time (Minutes): 60, Frequency (Times/Week): 3, Weekly Exercise (Minutes/Week): 180, Intensity: Moderate, Exercise limited by: None identified  Goals    . Patient Stated     I will continue to do lap swimming 3 days per week    . Weight (lb) < 200 lb (90.7 kg)      Depression Screen PHQ 2/9 Scores 12/28/2019 09/09/2015 05/29/2014  PHQ - 2 Score 0 0 0  PHQ- 9 Score 0 - -    Fall Risk Fall Risk  12/28/2019 09/09/2015 05/29/2014  Falls in the past year? 0 Yes No  Number falls in past yr: 0 1 -  Injury with Fall? 0 Yes -  Risk for fall due to : Medication side effect Other (Comment) -  Risk for fall due to: Comment - Pt was lightheaded and tripped -  Follow up Falls evaluation completed;Falls prevention discussed - -    Any stairs in or around the home? No  If so, are there any without handrails? No  Home free of loose throw rugs in walkways, pet beds, electrical cords,  etc? Yes  Adequate lighting in your home to reduce risk of falls? Yes   ASSISTIVE DEVICES UTILIZED TO PREVENT FALLS:  Life alert? No  Use of a cane, walker or w/c? No  Grab bars in the bathroom? No  Shower chair or bench in shower? Yes  Elevated toilet seat or a handicapped toilet? Yes    Cognitive Function:     6CIT Screen 12/28/2019  What Year? 0 points  What month? 0 points  What time? 0 points  Count back from 20 0 points  Months in reverse 0 points  Repeat phrase 2 points  Total Score 2    Immunizations Immunization History  Administered Date(s) Administered  . Fluad Quad(high Dose 65+) 03/09/2019  . Influenza Split 02/18/2011, 05/26/2013  . Influenza, High Dose Seasonal PF 03/11/2016, 03/24/2017, 02/09/2018  . Influenza,inj,Quad PF,6+ Mos 05/29/2014, 03/12/2015, 05/28/2015  . Moderna SARS-COVID-2 Vaccination 06/02/2019, 07/07/2019  . Pneumococcal Conjugate-13 05/31/2014  . Pneumococcal Polysaccharide-23 06/22/2016  . Td 01/09/2006  . Tdap 03/01/2018  . Zoster 02/18/2011    TDAP status: Up to date Flu Vaccine status: Up to date Pneumococcal vaccine status: Up to date Covid-19 vaccine status: Completed vaccines  Qualifies for Shingles Vaccine? Yes   Zostavax completed Yes   Shingrix Completed?: No.    Education has been provided regarding the importance of this vaccine. Patient has been advised to call insurance company to determine out of pocket expense if they have not yet received this vaccine. Advised may also receive vaccine at local pharmacy or Health Dept. Verbalized acceptance and understanding.  Screening Tests Health Maintenance  Topic Date Due  . INFLUENZA VACCINE  12/10/2019  . MAMMOGRAM  11/20/2020  . COLONOSCOPY  01/15/2025  .  TETANUS/TDAP  03/01/2028  . DEXA SCAN  Completed  . COVID-19 Vaccine  Completed  . Hepatitis C Screening  Completed  . PNA vac Low Risk Adult  Completed    Health Maintenance  Health Maintenance Due  Topic Date  Due  . INFLUENZA VACCINE  12/10/2019    Colorectal cancer screening: Completed 01/16/2015. Repeat every 10 years Mammogram status: Completed 11/23/2019. Repeat every year Bone Density status: Completed 11/04/2010. Results reflect: Bone density results: NORMAL. Repeat every 0 years.  Lung Cancer Screening: (Low Dose CT Chest recommended if Age 8-80 years, 30 pack-year currently smoking OR have quit w/in 15years.) does not qualify.   Lung Cancer Screening Referral: N/A  Additional Screening:  Hepatitis C Screening: does qualify; Completed 02/09/2019  Vision Screening: Recommended annual ophthalmology exams for early detection of glaucoma and other disorders of the eye. Is the patient up to date with their annual eye exam?  Yes  Who is the provider or what is the name of the office in which the patient attends annual eye exams? Dr. Venetia Maxon If pt is not established with a provider, would they like to be referred to a provider to establish care? No .   Dental Screening: Recommended annual dental exams for proper oral hygiene  Community Resource Referral / Chronic Care Management: CRR required this visit?  No   CCM required this visit?  No      Plan:     I have personally reviewed and noted the following in the patient's chart:   . Medical and social history . Use of alcohol, tobacco or illicit drugs  . Current medications and supplements . Functional ability and status . Nutritional status . Physical activity . Advanced directives . List of other physicians . Hospitalizations, surgeries, and ER visits in previous 12 months . Vitals . Screenings to include cognitive, depression, and falls . Referrals and appointments  In addition, I have reviewed and discussed with patient certain preventive protocols, quality metrics, and best practice recommendations. A written personalized care plan for preventive services as well as general preventive health recommendations were  provided to patient.     Ofilia Neas, LPN   11/07/4763   Nurse Notes: None

## 2019-12-28 NOTE — Patient Instructions (Signed)
Katherine [name] , Thank you for taking time to come for your Medicare Wellness Visit. I appreciate your ongoing commitment to your health goals. Please review the following plan we discussed and let me know if I can assist you in the future.   Screening recommendations/referrals: Colonoscopy: Up to date, next due 01/14/2025 Mammogram: Up to date, next due 11/22/2020 Bone Density: No longer required Recommended yearly ophthalmology/optometry visit for glaucoma screening and checkup Recommended yearly dental visit for hygiene and checkup  Vaccinations: Influenza vaccine: Up to date, next due this fall 2021 Pneumococcal vaccine: Completed series  Tdap vaccine: Up to date, next due 03/01/2028 Shingles vaccine: Currently due, please contact your pharmacy to discuss cost and to receive vaccine   Advanced directives: Copies on file   Conditions/risks identified: None   Next appointment: 12/31/2020 @ 1: 15 PM with [facility] for Medicare Wellness visit via Telephone    Preventive Care 65 Years and Older, Female Preventive care refers to lifestyle choices and visits with your health care provider that can promote health and wellness. What does preventive care include?  A yearly physical exam. This is also called an annual well check.  Dental exams once or twice a year.  Routine eye exams. Ask your health care provider how often you should have your eyes checked.  Personal lifestyle choices, including:  Daily care of your teeth and gums.  Regular physical activity.  Eating a healthy diet.  Avoiding tobacco and drug use.  Limiting alcohol use.  Practicing safe sex.  Taking low-dose aspirin every day.  Taking vitamin and mineral supplements as recommended by your health care provider. What happens during an annual well check? The services and screenings done by your health care provider during your annual well check will depend on your age, overall health,  lifestyle risk factors, and family history of disease. Counseling  Your health care provider may ask you questions about your:  Alcohol use.  Tobacco use.  Drug use.  Emotional well-being.  Home and relationship well-being.  Sexual activity.  Eating habits.  History of falls.  Memory and ability to understand (cognition).  Work and work Statistician.  Reproductive health. Screening  You may have the following tests or measurements:  Height, weight, and BMI.  Blood pressure.  Lipid and cholesterol levels. These may be checked every 5 years, or more frequently if you are over 5 years old.  Skin check.  Lung cancer screening. You may have this screening every year starting at age 53 if you have a 30-pack-year history of smoking and currently smoke or have quit within the past 15 years.  Fecal occult blood test (FOBT) of the stool. You may have this test every year starting at age 25.  Flexible sigmoidoscopy or colonoscopy. You may have a sigmoidoscopy every 5 years or a colonoscopy every 10 years starting at age 21.  Hepatitis C blood test.  Hepatitis B blood test.  Sexually transmitted disease (STD) testing.  Diabetes screening. This is done by checking your blood sugar (glucose) after you have not eaten for a while (fasting). You may have this done every 1-3 years.  Bone density scan. This is done to screen for osteoporosis. You may have this done starting at age 55.  Mammogram. This may be done every 1-2 years. Talk to your health care provider about how often you should have regular mammograms. Talk with your health care provider about your test results, treatment options, and if necessary, the need for  more tests. Vaccines  Your health care provider may recommend certain vaccines, such as:  Influenza vaccine. This is recommended every year.  Tetanus, diphtheria, and acellular pertussis (Tdap, Td) vaccine. You may need a Td booster every 10 years.  Zoster  vaccine. You may need this after age 62.  Pneumococcal 13-valent conjugate (PCV13) vaccine. One dose is recommended after age 79.  Pneumococcal polysaccharide (PPSV23) vaccine. One dose is recommended after age 70. Talk to your health care provider about which screenings and vaccines you need and how often you need them. This information is not intended to replace advice given to you by your health care provider. Make sure you discuss any questions you have with your health care provider. Document Released: 05/24/2015 Document Revised: 01/15/2016 Document Reviewed: 02/26/2015 Elsevier Interactive Patient Education  2017 [employer] Prevention in the Home Falls can cause injuries. They can happen to people of all ages. There are many things you can do to make your home safe and to help prevent falls. What can I do on the outside of my home?  Regularly fix the edges of walkways and driveways and fix any cracks.  Remove anything that might make you trip as you walk through a door, such as a raised step or threshold.  Trim any bushes or trees on the path to your home.  Use bright outdoor lighting.  Clear any walking paths of anything that might make someone trip, such as rocks or tools.  Regularly check to see if handrails are loose or broken. Make sure that both sides of any steps have handrails.  Any raised decks and porches should have guardrails on the edges.  Have any leaves, snow, or ice cleared regularly.  Use sand or salt on walking paths during winter.  Clean up any spills in your garage right away. This includes oil or grease spills. What can I do in the bathroom?  Use night lights.  Install grab bars by the toilet and in the tub and shower. Do not use towel bars as grab bars.  Use non-skid mats or decals in the tub or shower.  If you need to sit down in the shower, use a plastic, non-slip stool.  Keep the floor dry. Clean up any water that spills on the  floor as soon as it happens.  Remove soap buildup in the tub or shower regularly.  Attach bath mats securely with double-sided non-slip rug tape.  Do not have throw rugs and other things on the floor that can make you trip. What can I do in the bedroom?  Use night lights.  Make sure that you have a light by your bed that is easy to reach.  Do not use any sheets or blankets that are too big for your bed. They should not hang down onto the floor.  Have a firm chair that has side arms. You can use this for support while you get dressed.  Do not have throw rugs and other things on the floor that can make you trip. What can I do in the kitchen?  Clean up any spills right away.  Avoid walking on wet floors.  Keep items that you use a lot in easy-to-reach places.  If you need to reach something above you, use a strong step stool that has a grab bar.  Keep electrical cords out of the way.  Do not use floor polish or wax that makes floors slippery. If you must use wax, use non-skid  floor wax.  Do not have throw rugs and other things on the floor that can make you trip. What can I do with my stairs?  Do not leave any items on the stairs.  Make sure that there are handrails on both sides of the stairs and use them. Fix handrails that are broken or loose. Make sure that handrails are as long as the stairways.  Check any carpeting to make sure that it is firmly attached to the stairs. Fix any carpet that is loose or worn.  Avoid having throw rugs at the top or bottom of the stairs. If you do have throw rugs, attach them to the floor with carpet tape.  Make sure that you have a light switch at the top of the stairs and the bottom of the stairs. If you do not have them, ask someone to add them for you. What else can I do to help prevent falls?  Wear shoes that:  Do not have high heels.  Have rubber bottoms.  Are comfortable and fit you well.  Are closed at the toe. Do not wear  sandals.  If you use a stepladder:  Make sure that it is fully opened. Do not climb a closed stepladder.  Make sure that both sides of the stepladder are locked into place.  Ask someone to hold it for you, if possible.  Clearly mark and make sure that you can see:  Any grab bars or handrails.  First and last steps.  Where the edge of each step is.  Use tools that help you move around (mobility aids) if they are needed. These include:  Canes.  Walkers.  Scooters.  Crutches.  Turn on the lights when you go into a dark area. Replace any light bulbs as soon as they burn out.  Set up your furniture so you have a clear path. Avoid moving your furniture around.  If any of your floors are uneven, fix them.  If there are any pets around you, be aware of where they are.  Review your medicines with your doctor. Some medicines can make you feel dizzy. This can increase your chance of falling. Ask your doctor what other things that you can do to help prevent falls. This information is not intended to replace advice given to you by your health care provider. Make sure you discuss any questions you have with your health care provider. Document Released: 02/21/2009 Document Revised: 10/03/2015 Document Reviewed: 06/01/2014 Elsevier Interactive Patient Education  2017 [employer].

## 2020-01-21 ENCOUNTER — Other Ambulatory Visit: Payer: Self-pay | Admitting: Family Medicine

## 2020-01-23 ENCOUNTER — Other Ambulatory Visit: Payer: Self-pay

## 2020-01-24 ENCOUNTER — Ambulatory Visit (INDEPENDENT_AMBULATORY_CARE_PROVIDER_SITE_OTHER): Payer: Medicare Other | Admitting: Family Medicine

## 2020-01-24 ENCOUNTER — Encounter: Payer: Self-pay | Admitting: Family Medicine

## 2020-01-24 ENCOUNTER — Telehealth: Payer: Self-pay | Admitting: Family Medicine

## 2020-01-24 VITALS — BP 102/70 | HR 100 | Temp 98.5°F | Wt 229.0 lb

## 2020-01-24 DIAGNOSIS — M5442 Lumbago with sciatica, left side: Secondary | ICD-10-CM

## 2020-01-24 DIAGNOSIS — G609 Hereditary and idiopathic neuropathy, unspecified: Secondary | ICD-10-CM

## 2020-01-24 DIAGNOSIS — Z23 Encounter for immunization: Secondary | ICD-10-CM | POA: Diagnosis not present

## 2020-01-24 DIAGNOSIS — G8929 Other chronic pain: Secondary | ICD-10-CM

## 2020-01-24 MED ORDER — PREGABALIN 150 MG PO CAPS: 150.0000 mg | ORAL_CAPSULE | Freq: Two times a day (BID) | ORAL | 3 refills | Status: DC

## 2020-01-24 NOTE — Telephone Encounter (Signed)
Pt said her medication   pregabalin (LYRICA) 150 MG capsule should be going to     [facility], [address], [address], [place], [place] [address]  Phone:  [phone] Fax:  [phone]   And to change the one that went to [facility] 09/15 to [facility]

## 2020-01-24 NOTE — Progress Notes (Signed)
Subjective:    Patient ID: Katherine [name], female    DOB: 1951/03/02, 70 y.o.   MRN: [id number]  No chief complaint on file.   HPI Pt is a 69 yo female with pmh sig for HTN, GERD, hyperparathyroidism, severe idiopathic peripheral neuropathy, OA, DDD, h/o low back pain with sciatica, h/o hematuria 2/2 renal calculi who was seen today for ongoing concern.  Pt endorses continued low back pain and L leg pain.  Pt endorses a "hard pain" in low back.  Pain at times radiates into posterior L leg.  Worsened over several wks.  Tried icy hot, lyrica, and tramadol in the past, however tramadol caused tremors and aphasia.  Using regular regimen for neuropathy.  Pt seen by Neurology in the past.  Pt does not want to repeat NCS/EMG studies.  Past Medical History:  Diagnosis Date  . Complication of anesthesia     neuropathy in feet flares after surgery from PAS hose. needs cream on feet  . DDD (degenerative disc disease), lumbar 2012  . Dental crowns present   . GERD (gastroesophageal reflux disease)    occ, no medications at this time  . History of anemia 1991   not since hysterectomy  . History of gross hematuria 2002   with kidney stones  . History of hyperparathyroidism   . History of kidney stones   . Hypertension    states under control with meds., has been on med. x 20 yr.  . Impingement syndrome of right shoulder 2017  . Lymphedema of both lower extremities    due to neuropathy  . Lymphedema of both upper extremities    due to neuropathy  . Obese   . Osteoarthritis   . Peripheral neuropathy    hands and feet  . Sciatica   . Triangular fibrocartilage complex tear 06/2017   right  . 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 Wears partial dentures    upper and lower    Allergies  Allergen Reactions  . Erythromycin Other (See Comments)    CHEST PAIN    ROS General: Denies fever, chills, night sweats, changes in weight, changes in appetite HEENT: Denies headaches, ear pain,  changes in vision, rhinorrhea, sore throat CV: Denies CP, palpitations, SOB, orthopnea Pulm: Denies SOB, cough, wheezing GI: Denies abdominal pain, nausea, vomiting, diarrhea, constipation GU: Denies dysuria, hematuria, frequency, vaginal discharge Msk: Denies muscle cramps, joint pains  +Lower L back pain Neuro: Denies weakness  +numbness, tingling Skin: Denies rashes, bruising Psych: Denies depression, anxiety, hallucinations     Objective:    Blood pressure 102/70, pulse 100, temperature 98.5 F (36.9 C), temperature source Oral, weight 229 lb (103.9 kg), SpO2 98 %.   Gen. Pleasant, well-nourished, in no distress, normal affect  HEENT: [place]/AT, face symmetric, conjunctiva clear, no scleral icterus, PERRLA, EOMI, nares patent without drainage Lungs: no accessory muscle use, CTAB, no wheezes or rales Cardiovascular: RRR, no m/r/g, no peripheral edema Abdomen: BS present, soft, NT/ND, no CVA tenderness. Musculoskeletal: TTP of L lumbar paraspinal muscles, midline lumbar spine, and left sciatic nerve.  No deformities, no cyanosis or clubbing, normal tone Neuro:  A&Ox3, CN II-XII intact, normal gait Skin:  Warm, no lesions/ rash   Wt Readings from Last 3 Encounters:  01/24/20 229 lb (103.9 kg)  11/03/19 218 lb (98.9 kg)  02/09/19 253 lb 8 oz (115 kg)    Lab Results  Component Value Date   WBC 6.3 11/29/2018   HGB 12.6 11/29/2018   HCT  38.4 11/29/2018   PLT 184 11/29/2018   GLUCOSE 142 (H) 11/29/2018   CHOL 198 07/22/2018   TRIG 143.0 07/22/2018   HDL 41.50 07/22/2018   LDLDIRECT 139.1 02/11/2011   LDLCALC 128 (H) 07/22/2018   ALT 30 11/24/2018   AST 54 (H) 11/24/2018   NA 134 (L) 11/29/2018   K 4.1 11/29/2018   CL 103 11/29/2018   CREATININE 0.88 11/29/2018   BUN 14 11/29/2018   CO2 23 11/29/2018   TSH 1.300 02/09/2019   HGBA1C 5.7 11/03/2019    Assessment/Plan:  Hereditary and idiopathic peripheral neuropathy  -worsening -limited in remaining treatment  options -discussed increasing lyrica dose from 100 mg BID to 150 mg BID.  Pt advised to monitor for drowsiness. -continue EMLA cream TID, and nortriptyline 75 mg qhs -will have pt f/u with Neurology - Plan: Ambulatory referral to Neurology, pregabalin (LYRICA) 150 MG capsule  Chronic bilateral low back pain with left-sided sciatica -continue supportive care including stretching -will increase lyrica dose from 100 mg to 150 mg twice daily -Continue current medications including nortriptyline 75 mg nightly - Plan: Ambulatory referral to Neurology  Need for immunization against influenza  - Plan: Flu Vaccine QUAD High Dose(Fluad)  F/u prn in the next few wks  Grier Mitts, MD

## 2020-01-24 NOTE — Patient Instructions (Signed)
Peripheral Neuropathy Peripheral neuropathy is a type of nerve damage. It affects nerves that carry signals between the spinal cord and the arms, legs, and the rest of the body (peripheral nerves). It does not affect nerves in the spinal cord or brain. In peripheral neuropathy, one nerve or a group of nerves may be damaged. Peripheral neuropathy is a broad category that includes many specific nerve disorders, like diabetic neuropathy, hereditary neuropathy, and carpal tunnel syndrome. What are the causes? This condition may be caused by:  Diabetes. This is the most common cause of peripheral neuropathy.  Nerve injury.  Pressure or stress on a nerve that lasts a long time.  Lack (deficiency) of B vitamins. This can result from alcoholism, poor diet, or a restricted diet.  Infections.  Autoimmune diseases, such as rheumatoid arthritis and systemic lupus erythematosus.  Nerve diseases that are passed from parent to child (inherited).  Some medicines, such as cancer medicines (chemotherapy).  Poisonous (toxic) substances, such as lead and mercury.  Too little blood flowing to the legs.  Kidney disease.  Thyroid disease. In some cases, the cause of this condition is not known. What are the signs or symptoms? Symptoms of this condition depend on which of your nerves is damaged. Common symptoms include:  Loss of feeling (numbness) in the feet, hands, or both.  Tingling in the feet, hands, or both.  Burning pain.  Very sensitive skin.  Weakness.  Not being able to move a part of the body (paralysis).  Muscle twitching.  Clumsiness or poor coordination.  Loss of balance.  Not being able to control your bladder.  Feeling dizzy.  Sexual problems. How is this diagnosed? Diagnosing and finding the cause of peripheral neuropathy can be difficult. Your health care provider will take your medical history and do a physical exam. A neurological exam will also be done. This  involves checking things that are affected by your brain, spinal cord, and nerves (nervous system). For example, your health care provider will check your reflexes, how you move, and what you can feel. You may have other tests, such as:  Blood tests.  Electromyogram (EMG) and nerve conduction tests. These tests check nerve function and how well the nerves are controlling the muscles.  Imaging tests, such as CT scans or MRI to rule out other causes of your symptoms.  Removing a small piece of nerve to be examined in a lab (nerve biopsy). This is rare.  Removing and examining a small amount of the fluid that surrounds the brain and spinal cord (lumbar puncture). This is rare. How is this treated? Treatment for this condition may involve:  Treating the underlying cause of the neuropathy, such as diabetes, kidney disease, or vitamin deficiencies.  Stopping medicines that can cause neuropathy, such as chemotherapy.  Medicine to relieve pain. Medicines may include: ? Prescription or over-the-counter pain medicine. ? Antiseizure medicine. ? Antidepressants. ? Pain-relieving patches that are applied to painful areas of skin.  Surgery to relieve pressure on a nerve or to destroy a nerve that is causing pain.  Physical therapy to help improve movement and balance.  Devices to help you move around (assistive devices). Follow these instructions at home: Medicines  Take over-the-counter and prescription medicines only as told by your health care provider. Do not take any other medicines without first asking your health care provider.  Do not drive or use heavy machinery while taking prescription pain medicine. Lifestyle   Do not use any products that contain nicotine   or tobacco, such as cigarettes and e-cigarettes. Smoking keeps blood from reaching damaged nerves. If you need help quitting, ask your health care provider.  Avoid or limit alcohol. Too much alcohol can cause a vitamin B  deficiency, and vitamin B is needed for healthy nerves.  Eat a healthy diet. This includes: ? Eating foods that are high in fiber, such as fresh fruits and vegetables, whole grains, and beans. ? Limiting foods that are high in fat and processed sugars, such as fried or sweet foods. General instructions   If you have diabetes, work closely with your health care provider to keep your blood sugar under control.  If you have numbness in your feet: ? Check every day for signs of injury or infection. Watch for redness, warmth, and swelling. ? Wear padded socks and comfortable shoes. These help protect your feet.  Develop a good support system. Living with peripheral neuropathy can be stressful. Consider talking with a mental health specialist or joining a support group.  Use assistive devices and attend physical therapy as told by your health care provider. This may include using a walker or a cane.  Keep all follow-up visits as told by your health care provider. This is important. Contact a health care provider if:  You have new signs or symptoms of peripheral neuropathy.  You are struggling emotionally from dealing with peripheral neuropathy.  Your pain is not well-controlled. Get help right away if:  You have an injury or infection that is not healing normally.  You develop new weakness in an arm or leg.  You fall frequently. Summary  Peripheral neuropathy is when the nerves in the arms, or legs are damaged, resulting in numbness, weakness, or pain.  There are many causes of peripheral neuropathy, including diabetes, pinched nerves, vitamin deficiencies, autoimmune disease, and hereditary conditions.  Diagnosing and finding the cause of peripheral neuropathy can be difficult. Your health care provider will take your medical history, do a physical exam, and do tests, including blood tests and nerve function tests.  Treatment involves treating the underlying cause of the  neuropathy and taking medicines to help control pain. Physical therapy and assistive devices may also help. This information is not intended to replace advice given to you by your health care provider. Make sure you discuss any questions you have with your health care provider. Document Revised: 04/09/2017 Document Reviewed: 07/06/2016 Elsevier Patient Education  2020 [employer].  What You Need to Know About Chronic Back Pain Long-term (chronic) back pain is back pain that lasts for 12 weeks or longer. It often affects the lower back and can range from mild to severe. Many people have back pain at some point in their lives. It can feel different to each person. It may feel like a muscle ache or a sharp, stabbing pain. The pain often gets worse over time. It can be difficult to find the cause of chronic back pain. Treating chronic back pain often starts with rest and pain relief, followed by exercises (physical therapy) to strengthen the muscles that support your back. You may have to try different things to see what works best for you. If other treatments do not help, or if your pain is caused by a condition or an injury, you may need surgery. How can back pain affect me? Chronic back pain is uncomfortable and can make it hard to do your usual daily activities. Chronic back pain can:  Cause numbness and tingling.  Come and go.  Get  worse when you are sitting, standing, walking, bending, or lifting.  Affect you while you are active, at rest, or both.  Eventually make it hard to move around.  Occur with fever, weight loss, or difficulty urinating. What are the benefits of treating back pain? Treating chronic back pain may:  Relieve pain.  Keep your pain from getting worse.  Make it easier for you to do your usual activities. What are some steps I can take to decrease my back pain?   Take over-the-counter or prescription medicines only as told by your health care provider.  If  directed, apply heat to the affected area. Use the heat source that your health care provider recommends, such as a moist heat pack or a heating pad. ? Place a towel between your skin and the heat source. ? Leave the heat on for 20-30 minutes. ? Remove the heat if your skin turns bright red. This is especially important if you are unable to feel pain, heat, or cold. You may have a greater risk of getting burned.  If directed, put ice on the affected area: ? Put ice in a plastic bag. ? Place a towel between your skin and the bag. ? Leave the ice on for 20 minutes, 2-3 times a day.  Get regular exercise as told by your health care provider to improve flexibility and strength.  Do not smoke.  Maintain a healthy weight.  When lifting objects: ? Keep your feet as far apart as your shoulders (shoulder-width apart) or farther apart. ? Tighten the muscles in your abdomen. ? Bend your knees and hips and keep your spine neutral. It is important to lift using the strength of your legs, not your back. Do not lock your knees straight out. ? Always ask for help to lift heavy or awkward objects. What can happen if my back pain goes untreated? Untreated back pain can:  Get worse over time.  Start to occur more often or at different times, such as when you are resting.  Cause posture problems.  Make it hard to move around (limit mobility). Where can I get support? Chronic back pain can be a frustrating condition to manage. It may help to talk with other people who are having a similar experience. Consider joining a support group for people dealing with chronic back pain. Ask your health care provider about support groups in your area. You can also find online and in-person support groups through:  The American Chronic Pain Association: DeluxeOption.si  The U.S. Pain Foundation: uspainfoundation.org/support-groups Contact a health care provider if:  Your symptoms do not get  better or they get worse.  You have severe back pain.  You have chronic back pain and a fever.  You lose weight without trying.  You have difficulty urinating.  You experience numbness or tingling.  You develop new pain after an injury. Summary  Chronic back pain is often treated with rest, pain relief, and physical therapy.  Get regular exercise to improve your strength and flexibility.  Put heat and ice on the affected areas as directed by your health care provider.  Chronic back pain can be challenging to live with. Joining a support group may help you manage your condition. This information is not intended to replace advice given to you by your health care provider. Make sure you discuss any questions you have with your health care provider. Document Revised: 04/09/2017 Document Reviewed: 01/04/2016 Elsevier Patient Education  2020 [employer].

## 2020-01-26 ENCOUNTER — Other Ambulatory Visit: Payer: Self-pay

## 2020-01-26 DIAGNOSIS — G609 Hereditary and idiopathic neuropathy, unspecified: Secondary | ICD-10-CM

## 2020-01-26 MED ORDER — PREGABALIN 150 MG PO CAPS: 150.0000 mg | ORAL_CAPSULE | Freq: Two times a day (BID) | ORAL | 3 refills | Status: DC

## 2020-01-26 NOTE — Telephone Encounter (Signed)
Pt Rx resend to [facility] for refill

## 2020-02-05 ENCOUNTER — Encounter: Payer: Self-pay | Admitting: Family Medicine

## 2020-02-23 ENCOUNTER — Other Ambulatory Visit: Payer: Self-pay

## 2020-02-23 ENCOUNTER — Encounter: Payer: Self-pay | Admitting: Family Medicine

## 2020-02-23 ENCOUNTER — Telehealth (INDEPENDENT_AMBULATORY_CARE_PROVIDER_SITE_OTHER): Payer: Medicare Other | Admitting: Family Medicine

## 2020-02-23 DIAGNOSIS — G609 Hereditary and idiopathic neuropathy, unspecified: Secondary | ICD-10-CM

## 2020-02-23 MED ORDER — TRAMADOL HCL 50 MG PO TABS: ORAL_TABLET | ORAL | 0 refills | Status: DC

## 2020-02-23 NOTE — Progress Notes (Signed)
Rx faxed to pt pharmacy 
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
## 2020-03-11 ENCOUNTER — Telehealth: Payer: Self-pay | Admitting: Family Medicine

## 2020-03-11 NOTE — Telephone Encounter (Signed)
Pt is calling in inference to the lab work from the Neurologist at [facility] she stated that she has question about it.

## 2020-03-12 NOTE — Telephone Encounter (Signed)
FYI Spoke with pt state that she requested her neurologist who is with [facility] to fax over some labs results that they did at the visit, Pt state that the protein lever came back high and that she wanted Dr Volanda Napoleon to review results with her and give recommendations. Pt aware that our office has not received any lab results from [facility]. Pt state that she will have them refax results to Dr Volanda Napoleon for review. Pt requests a call back when Dr Volanda Napoleon receives and reviews results.

## 2020-03-12 NOTE — Telephone Encounter (Signed)
FYI

## 2020-03-18 ENCOUNTER — Other Ambulatory Visit: Payer: Self-pay | Admitting: Family Medicine

## 2020-03-18 NOTE — Telephone Encounter (Signed)
Pt LOV was on 02/23/2020 and last refill was done on 09/29/2019 for 180 with 1 refill, please advise if ok for refill

## 2020-03-19 NOTE — Telephone Encounter (Signed)
The patient called wanting to know if Dr. Volanda Napoleon has received the results from the neurologist. If she hasn't did she want her to go pick them up and bring them to the office.  The Oncologist is calling the patient to schedule but they need the results from the Neurologist.  Please advise

## 2020-03-28 ENCOUNTER — Encounter: Payer: Self-pay | Admitting: Family Medicine

## 2020-03-28 ENCOUNTER — Other Ambulatory Visit: Payer: Self-pay

## 2020-03-28 ENCOUNTER — Telehealth (INDEPENDENT_AMBULATORY_CARE_PROVIDER_SITE_OTHER): Payer: Medicare Other | Admitting: Family Medicine

## 2020-03-28 DIAGNOSIS — G609 Hereditary and idiopathic neuropathy, unspecified: Secondary | ICD-10-CM

## 2020-03-28 DIAGNOSIS — R899 Unspecified abnormal finding in specimens from other organs, systems and tissues: Secondary | ICD-10-CM

## 2020-03-28 NOTE — Progress Notes (Signed)
Virtual Visit via Telephone Note  I connected with Katherine [name] on 03/28/20 at 11:00 AM EST by telephone and verified that I am speaking with the correct person using two identifiers.   I discussed the limitations, risks, security and privacy concerns of performing an evaluation and management service by telephone and the availability of in person appointments. I also discussed with the patient that there may be a patient responsible charge related to this service. The patient expressed understanding and agreed to proceed.  Location patient: home Location provider: work or home office Participants present for the call: patient, provider Patient did not have a visit in the prior 7 days to address this/these issue(s).   History of Present Illness: Pt is a 69 yo female with pmh sig for idiopathic neuropathy, HTN, GERD, hyperparathyroidism, TKR, renal calculi, PreDM, HLD who was seen for follow-up.  Pt was seen by Ortho this am for pain in her knees.  Pt states she had xrays but they didn't show anything new.  Pt thinks pain is more from her neuropathy.  Pt seen by [facility] on 02/02/20, had several labs.  Patient states protein electrophoresis lab was elevated.  Patient requested results be sent to the office but they have not been received.  Patient states the neurologist wanted to send her to the oncologist in [place] however not too far for her to drive.  During last OFV Lyrica was increased from 100 to 150 mg twice daily which has helped.     Observations/Objective: Patient sounds cheerful and well on the phone. I do not appreciate any SOB. Speech and thought processing are grossly intact. Patient reported vitals:  Assessment and Plan: Idiopathic neuropathy -will look to see if labs faxed over from [facility]. -Office note from visit 02/02/2020 with [place] neurology, Dr. Jannifer Franklin reviewed.  Labs ordered include copper, protein electrophoresis, immunoglobulin free light  chains, vitamin B12, RPR, gliadin antibodies, Sjogren's syndrome A&B nuclear antibodies -Continue current treatments including Lyrica 150 mg twice daily, EMLA cream nortriptyline 25 mg  Abnormal laboratory test result -We will obtain labs results Neurology.  Further recommendations based on results.  If needed will place subsequent referral to local specialist based on lab results.  Follow-up as needed   Follow Up Instructions:  99441 5-10 99442 11-20 9443 21-30 I did not refer this patient for an OV in the next 24 hours for this/these issue(s).  I discussed the assessment and treatment plan with the patient. The patient was provided an opportunity to ask questions and all were answered. The patient agreed with the plan and demonstrated an understanding of the instructions.   The patient was advised to call back or seek an in-person evaluation if the symptoms worsen or if the condition fails to improve as anticipated.  I provided 10 minutes of non-face-to-face time during this encounter.   Billie Ruddy, MD

## 2020-03-29 ENCOUNTER — Other Ambulatory Visit: Payer: Self-pay | Admitting: Family Medicine

## 2020-03-29 DIAGNOSIS — G609 Hereditary and idiopathic neuropathy, unspecified: Secondary | ICD-10-CM

## 2020-03-29 DIAGNOSIS — R778 Other specified abnormalities of plasma proteins: Secondary | ICD-10-CM

## 2020-03-29 NOTE — Progress Notes (Signed)
Labs from [place] [facility] faxed over to the office and reviewed by this provider.  Mild elevation in free kappa light chains (24.3) noted.  Will place referral to heme-onc for further evaluation.

## 2020-04-02 ENCOUNTER — Telehealth: Payer: Self-pay | Admitting: Hematology and Oncology

## 2020-04-02 NOTE — Telephone Encounter (Signed)
Pt had a Telephone visit with Dr Volanda Napoleon on 03/28/2020

## 2020-04-02 NOTE — Telephone Encounter (Signed)
Scheduled appointment per 11/22 new patient referral. Spoke to patient who is aware of appointment date and time.  

## 2020-04-09 NOTE — Progress Notes (Signed)
Katherine [name] NOTE  Patient Care Team: Katherine Ruddy, MD as PCP - General (Family Medicine)  CHIEF COMPLAINTS/PURPOSE OF CONSULTATION:   Abnormal labs  ASSESSMENT & PLAN:  No problem-specific Assessment & Plan notes found for this encounter.  Orders Placed This Encounter  Procedures  . UPEP/TP, 24-Hr Urine    Standing Status:   Future    Standing Expiration Date:   04/11/2021  . CBC with Differential/Platelet    Standing Status:   Standing    Number of Occurrences:   22    Standing Expiration Date:   04/11/2021  . Comprehensive metabolic panel    Standing Status:   Standing    Number of Occurrences:   33    Standing Expiration Date:   04/11/2021  . IgG, IgA, IgM    Standing Status:   Future    Number of Occurrences:   1    Standing Expiration Date:   04/11/2021   This is a very pleasant 69 year old female patient with past medical history significant for idiopathic peripheral neuropathy, obesity, osteoarthritis referred to hematology for evaluation of abnormal labs during her work-up her peripheral neuropathy.  Patient was following up with a neurologist in [facility] and recently seek a second opinion when she had some blood work which demonstrated abnormal kappa light chains and hence was referred to hematology for further recommendations.   Katherine [name] basically complains of progressive neuropathy for the past 10 years.  Besides progressive neuropathy, she denies any new complaints.  She has chronic bone pains, no new B symptoms or new bone pains.  No other concerning review of systems. Physical examination unremarkable, no palpable lymphadenopathy or hepatosplenomegaly. No evidence of POEMS syndrome. I reviewed pertinent labs, no monoclonal spike on serum protein electrophoresis, mildly elevated kappa light chains with normal kappa lambda ratio.   I did not see a recent CBC or CMP sent to [place].  B12 levels, copper levels are normal. I  recommend we do a CBC, CMP, immunoglobulin levels and urine protein electrophoresis.  If this did not show any evidence of monoclonal gammopathy, she can just follow-up with her doctor for management of ongoing issues.  I also encouraged age-appropriate cancer screening, she expressed understanding.   It does sound like she most likely has idiopathic peripheral neuropathy, no definitive risk factors identified. She will follow-up if labs from today show pertinent results.  Thank you for consulting [place] in the care of this patient.  Please do not hesitate to contact us with any additional questions or concerns.  HISTORY OF PRESENTING ILLNESS:   Katherine [name] 69 y.o. female is here because of abnormal spep.  Pt is a 69 yo female with pmh sig for idiopathic neuropathy, HTN, GERD, hyperparathyroidism, TKR, renal calculi, HLD who is now referred to hematology for abnormal protein electrophoresis. Patient has had idiopathic peripheral neuropathy for over 10 years and has been following up with neurologist and recently went to a different neurologist for second opinion.  She had some investigations which showed normal serum protein electrophoresis but slightly elevated kappa light chains with normal kappa lambda ratio.  She was hence referred to hematology.  She arrived to the appointment today by herself.  She is a good historian.  She tells me that her neuropathy has progressed in the past several years with less pain and more numbness in both her legs and her hands.  She wears copper sleeves to keep them warm which has been helpful.  She also takes some medications for neuropathy.  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  She also has some back pain which is chronic.  No new bone pains.  No new B symptoms.  Besides the neuropathy, she feels that everything has been the same.  No change in  breathing, bowel habits or urinary habits.  She denies any heavy alcohol use in the past.  She was on B12 supplementation but recently discontinued since her B12 levels were very high.  No occupational toxin exposure to explain the neuropathy.  She was prediabetic a couple years ago but with diet modification, her sugars have been pretty well controlled at this problem has resolved.   Rest of the pertinent 10 point ROS reviewed and as mentioned below.  REVIEW OF SYSTEMS:    Constitutional: Denies fevers, chills or abnormal night sweats Eyes: Denies blurriness of vision, double vision or watery eyes Ears, nose, mouth, throat, and face: Denies mucositis or sore throat Respiratory: Denies cough, dyspnea or wheezes Cardiovascular: Denies palpitation, chest discomfort or lower extremity swelling Gastrointestinal:  Denies nausea, heartburn or change in bowel habits Skin: Denies abnormal skin rashes Lymphatics: Denies new lymphadenopathy or easy bruising Neurological: As mentioned in HPI Behavioral/Psych: Mood is stable, no new changes  All other systems were reviewed with the patient and are negative.  MEDICAL HISTORY:  Past Medical History:  Diagnosis Date  . Complication of anesthesia     neuropathy in feet flares after surgery from PAS hose. needs cream on feet  . DDD (degenerative disc disease), lumbar 2012  . Dental crowns present   . GERD (gastroesophageal reflux disease)    occ, no medications at this time  . History of anemia 1991   not since hysterectomy  . History of gross hematuria 2002   with kidney stones  . History of hyperparathyroidism   . History of kidney stones   . Hypertension    states under control with meds., has been on med. x 20 yr.  . Impingement syndrome of right shoulder 2017  . Lymphedema of both lower extremities    due to neuropathy  . Lymphedema of both upper extremities    due to neuropathy  . Obese   . Osteoarthritis   . Peripheral neuropathy     hands and feet  . Sciatica   . Triangular fibrocartilage complex tear 06/2017   right  . 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 Wears partial dentures    upper and lower    SURGICAL HISTORY: Past Surgical History:  Procedure Laterality Date  . ABDOMINAL HYSTERECTOMY  1991   partial  . CARPAL TUNNEL RELEASE Right 08/09/2002  . CARPAL TUNNEL RELEASE Left 11/03/2006  . COLONOSCOPY  01/2016  . COLONOSCOPY WITH PROPOFOL  11/24/2011  . CYSTOSCOPY WITH RETROGRADE PYELOGRAM, URETEROSCOPY AND STENT PLACEMENT Left 12/18/2014   Procedure: CYSTOSCOPY WITH RETROGRADE PYELOGRAM, URETEROSCOPY AND STENT PLACEMENT left ureter;  Surgeon: Raynelle Bring, MD;  Location: [facility];  Service: Urology;  Laterality: Left;  . DILATION AND CURETTAGE OF UTERUS    . ELBOW ARTHROSCOPY Right   . EXTRACORPOREAL SHOCK WAVE LITHOTRIPSY Left 03/08/2017   Procedure: LEFT EXTRACORPOREAL SHOCK WAVE LITHOTRIPSY (ESWL);  Surgeon: Raynelle Bring, MD;  Location: [facility];  Service: Urology;  Laterality: Left;  . HOLMIUM LASER APPLICATION Left 7/0/4888   Procedure: HOLMIUM LASER APPLICATION left ureter ;  Surgeon: Wynetta Emery  Alinda Money, MD;  Location: [facility];  Service: Urology;  Laterality: Left;  . PARATHYROIDECTOMY Right 05/03/2015   Procedure: RIGHT SUPERIOR PARATHYROIDECTOMY;  Surgeon: Armandina Gemma, MD;  Location: [place];  Service: General;  Laterality: Right;  . PATELLA REALIGNMENT Bilateral    as a teenager  . ROTATOR CUFF REPAIR Right    x2  . SHOULDER ARTHROSCOPY Right    x 2  . SHOULDER ARTHROSCOPY W/ ROTATOR CUFF REPAIR Left 04/26/2002  . SYNOVECTOMY WITH POLY EXCHANGE Left 11/28/2018   Procedure: SYNOVECTOMY WITH Posey Pronto;  Surgeon: Vickey Huger, MD;  Location: [facility];  Service: Orthopedics;  Laterality: Left;  . TONSILLECTOMY    . TOTAL KNEE ARTHROPLASTY Left 09/10/2003  . TOTAL KNEE ARTHROPLASTY Right 11/10/2004  . TOTAL KNEE ARTHROPLASTY Right 05/02/2018   Procedure: Right polyethelene exchange and open  synovectomy;  Surgeon: Vickey Huger, MD;  Location: [facility];  Service: Orthopedics;  Laterality: Right;  . TRIGGER FINGER RELEASE Right 08/06/2008   ring finger  . TRIGGER FINGER RELEASE     multiple - right thumb, left index/long/ring/thumb  . ULNA OSTEOTOMY Right 07/28/2017   Procedure: REVISION RIGHT ULNAR SHORTENING OSTEOTOMY;  Surgeon: Leandrew Koyanagi, MD;  Location: [place];  Service: Orthopedics;  Laterality: Right;  . WRIST ARTHROSCOPY Left 06/16/2000  . WRIST ARTHROSCOPY WITH ULNA SHORTENING Right 07/07/2017   Procedure: RIGHT WRIST ARTHROSCOPY WITH ULNAR SHORTENING OSTEOTOMY;  Surgeon: Leandrew Koyanagi, MD;  Location: [place];  Service: Orthopedics;  Laterality: Right;    SOCIAL HISTORY: Social History   Socioeconomic History  . Marital status: Single    Spouse name: Not on file  . Number of children: 1  . Years of education: some college  . Highest education level: Not on file  Occupational History  . Occupation: Retired in 2011    Employer: [employer]  Tobacco Use  . Smoking status: Never Smoker  . Smokeless tobacco: Never Used  Vaping Use  . Vaping Use: Never used  Substance and Sexual Activity  . Alcohol use: Not Currently  . Drug use: Never  . Sexual activity: Never    Birth control/protection: Surgical  Other Topics Concern  . Not on file  Social History Narrative   Patient is single and lives with her brother.    Patient is right handed.   Caffeine - no daily use.   Social Determinants of Health   Financial Resource Strain: Low Risk   . Difficulty of Paying Living Expenses: Not hard at all  Food Insecurity: No Food Insecurity  . Worried About Charity fundraiser in the Last Year: Never true  . Ran Out of Food in the Last Year: Never true  Transportation Needs: No Transportation Needs  . Lack of Transportation (Medical): No  . Lack of Transportation (Non-Medical): No  Physical Activity: Sufficiently Active  . Days of  Exercise per Week: 3 days  . Minutes of Exercise per Session: 60 min  Stress: No Stress Concern Present  . Feeling of Stress : Not at all  Social Connections: Moderately Integrated  . Frequency of Communication with Friends and Family: More than three times a week  . Frequency of Social Gatherings with Friends and Family: Once a week  . Attends Religious Services: More than 4 times per year  . Active Member of Clubs or Organizations: Yes  . Attends Archivist Meetings: More than 4 times per year  . Marital Status: Never married  Human resources officer  Violence: Not At Risk  . Fear of Current or Ex-Partner: No  . Emotionally Abused: No  . Physically Abused: No  . Sexually Abused: No    FAMILY HISTORY: Family History  Problem Relation Age of Onset  . Stroke Mother   . Cancer Mother 5       bone cancer  . Other Father        unknown medical history  . Diabetes Sister     ALLERGIES:  is allergic to erythromycin.  MEDICATIONS:  Current Outpatient Medications  Medication Sig Dispense Refill  . Cholecalciferol (VITAMIN D) 50 MCG (2000 UT) tablet Take 2,000 Units by mouth daily.     . cycloSPORINE (RESTASIS) 0.05 % ophthalmic emulsion Place 1 drop into both eyes 2 (two) times daily.    . hydrochlorothiazide (HYDRODIURIL) 12.5 MG tablet Take 12.5 mg by mouth daily.    Marland Kitchen L-Methylfolate-Algae-B12-B6 (METANX) 3-90.314-2-35 MG CAPS TAKE 1 CAPSULE BY MOUTH TWO TIMES A DAY 180 capsule 1  . lidocaine-prilocaine (EMLA) cream APPLY 1 APPLICATION TO  AFFECTED AREA(S) TOPICALLY  3 TIMES DAILY AS NEEDED 360 g 1  . lisinopril (ZESTRIL) 10 MG tablet TAKE 1 TABLET BY MOUTH  DAILY 90 tablet 3  . Multiple Vitamin (MULTIVITAMIN) tablet Take 1 tablet by mouth daily.    . nortriptyline (PAMELOR) 25 MG capsule TAKE 3 CAPSULES BY MOUTH AT BEDTIME 270 capsule 3  . pregabalin (LYRICA) 150 MG capsule Take 1 capsule (150 mg total) by mouth 2 (two) times daily. 60 capsule 3  . traMADol (ULTRAM) 50 MG  tablet Take one to two tablets by mouth every 6 hours if needed for pain, Prescribed by Neurology 180 tablet 0   No current facility-administered medications for this visit.     PHYSICAL EXAMINATION: ECOG PERFORMANCE STATUS: 1 - Symptomatic but completely ambulatory  Vitals:   04/11/20 1109  BP: 133/78  Pulse: 100  Resp: 20  Temp: (!) 97.5 F (36.4 C)  SpO2: 97%   Filed Weights   04/11/20 1109  Weight: 241 lb 1.6 oz (109.4 kg)    GENERAL:alert, no distress and comfortable. Obese. SKIN: skin color, texture, turgor are normal, no rashes or significant lesions EYES: normal, conjunctiva are pink and non-injected, sclera clear OROPHARYNX:no exudate, no erythema and lips, buccal mucosa, and tongue normal  NECK: supple, thyroid normal size, non-tender, without nodularity LYMPH:  no palpable lymphadenopathy in the cervical, axillary or inguinal LUNGS: clear to auscultation and percussion with normal breathing effort HEART: regular rate & rhythm and no murmurs and no lower extremity edema ABDOMEN:abdomen soft, non-tender and normal bowel sounds Musculoskeletal:no cyanosis of digits and no clubbing, ROM limited but completely ambulatory PSYCH: alert & oriented x 3 with fluent speech NEURO: no focal motor/sensory deficits  LABORATORY DATA:  I have reviewed the data as listed Lab Results  Component Value Date   WBC 3.5 (L) 04/11/2020   HGB 13.0 04/11/2020   HCT 39.2 04/11/2020   MCV 88.9 04/11/2020   PLT 145 (L) 04/11/2020     Chemistry      Component Value Date/Time   NA 134 (L) 11/29/2018 0259   K 4.1 11/29/2018 0259   CL 103 11/29/2018 0259   CO2 23 11/29/2018 0259   BUN 14 11/29/2018 0259   CREATININE 0.88 11/29/2018 0259      Component Value Date/Time   CALCIUM 8.8 (L) 11/29/2018 0259   CALCIUM 9.8 06/17/2009 2212   ALKPHOS 67 11/24/2018 0849   AST 54 (H) 11/24/2018 8016  ALT 30 11/24/2018 0849   BILITOT 0.7 11/24/2018 0849     I have reviewed labs. Normal  K/L ratio Mildly elevated kappa light chains SPEP, no spike.  RADIOGRAPHIC STUDIES: I have personally reviewed the radiological images as listed and agreed with the findings in the report. No results found.  All questions w ere answered. The patient knows to call the clinic with any problems, questions or concerns. I spent 45 minutes in the care of this patient including H and P, review of medical records, counseling and coordination of care.      Benay Pike, MD 04/11/2020 12:57 PM

## 2020-04-11 ENCOUNTER — Encounter: Payer: Self-pay | Admitting: Hematology and Oncology

## 2020-04-11 ENCOUNTER — Inpatient Hospital Stay: Payer: Medicare Other | Attending: Hematology and Oncology | Admitting: Hematology and Oncology

## 2020-04-11 ENCOUNTER — Inpatient Hospital Stay: Payer: Medicare Other

## 2020-04-11 ENCOUNTER — Other Ambulatory Visit: Payer: Self-pay

## 2020-04-11 VITALS — BP 133/78 | HR 100 | Temp 97.5°F | Resp 20 | Ht 64.0 in | Wt 241.1 lb

## 2020-04-11 DIAGNOSIS — E213 Hyperparathyroidism, unspecified: Secondary | ICD-10-CM | POA: Insufficient documentation

## 2020-04-11 DIAGNOSIS — E785 Hyperlipidemia, unspecified: Secondary | ICD-10-CM | POA: Diagnosis not present

## 2020-04-11 DIAGNOSIS — G8929 Other chronic pain: Secondary | ICD-10-CM | POA: Insufficient documentation

## 2020-04-11 DIAGNOSIS — Z87442 Personal history of urinary calculi: Secondary | ICD-10-CM | POA: Insufficient documentation

## 2020-04-11 DIAGNOSIS — R779 Abnormality of plasma protein, unspecified: Secondary | ICD-10-CM | POA: Insufficient documentation

## 2020-04-11 DIAGNOSIS — G609 Hereditary and idiopathic neuropathy, unspecified: Secondary | ICD-10-CM | POA: Insufficient documentation

## 2020-04-11 DIAGNOSIS — Z808 Family history of malignant neoplasm of other organs or systems: Secondary | ICD-10-CM | POA: Insufficient documentation

## 2020-04-11 DIAGNOSIS — I10 Essential (primary) hypertension: Secondary | ICD-10-CM | POA: Insufficient documentation

## 2020-04-11 DIAGNOSIS — M549 Dorsalgia, unspecified: Secondary | ICD-10-CM | POA: Insufficient documentation

## 2020-04-11 DIAGNOSIS — Z823 Family history of stroke: Secondary | ICD-10-CM | POA: Insufficient documentation

## 2020-04-11 DIAGNOSIS — K219 Gastro-esophageal reflux disease without esophagitis: Secondary | ICD-10-CM | POA: Diagnosis not present

## 2020-04-11 DIAGNOSIS — Z833 Family history of diabetes mellitus: Secondary | ICD-10-CM | POA: Insufficient documentation

## 2020-04-11 DIAGNOSIS — E669 Obesity, unspecified: Secondary | ICD-10-CM | POA: Diagnosis not present

## 2020-04-11 DIAGNOSIS — Z881 Allergy status to other antibiotic agents status: Secondary | ICD-10-CM | POA: Diagnosis not present

## 2020-04-11 DIAGNOSIS — Z79899 Other long term (current) drug therapy: Secondary | ICD-10-CM | POA: Diagnosis not present

## 2020-04-11 LAB — CBC WITH DIFFERENTIAL/PLATELET
Abs Immature Granulocytes: 0.01 10*3/uL (ref 0.00–0.07)
Basophils Absolute: 0 10*3/uL (ref 0.0–0.1)
Basophils Relative: 1 %
Eosinophils Absolute: 0.2 10*3/uL (ref 0.0–0.5)
Eosinophils Relative: 5 %
HCT: 39.2 % (ref 36.0–46.0)
Hemoglobin: 13 g/dL (ref 12.0–15.0)
Immature Granulocytes: 0 %
Lymphocytes Relative: 52 %
Lymphs Abs: 1.8 10*3/uL (ref 0.7–4.0)
MCH: 29.5 pg (ref 26.0–34.0)
MCHC: 33.2 g/dL (ref 30.0–36.0)
MCV: 88.9 fL (ref 80.0–100.0)
Monocytes Absolute: 0.5 10*3/uL (ref 0.1–1.0)
Monocytes Relative: 15 %
Neutro Abs: 0.9 10*3/uL — ABNORMAL LOW (ref 1.7–7.7)
Neutrophils Relative %: 27 %
Platelets: 145 10*3/uL — ABNORMAL LOW (ref 150–400)
RBC: 4.41 MIL/uL (ref 3.87–5.11)
RDW: 14.4 % (ref 11.5–15.5)
WBC: 3.5 10*3/uL — ABNORMAL LOW (ref 4.0–10.5)
nRBC: 0 % (ref 0.0–0.2)

## 2020-04-11 LAB — COMPREHENSIVE METABOLIC PANEL
ALT: 32 U/L (ref 0–44)
AST: 59 U/L — ABNORMAL HIGH (ref 15–41)
Albumin: 3.6 g/dL (ref 3.5–5.0)
Alkaline Phosphatase: 74 U/L (ref 38–126)
Anion gap: 6 (ref 5–15)
BUN: 14 mg/dL (ref 8–23)
CO2: 29 mmol/L (ref 22–32)
Calcium: 9.8 mg/dL (ref 8.9–10.3)
Chloride: 104 mmol/L (ref 98–111)
Creatinine, Ser: 0.9 mg/dL (ref 0.44–1.00)
GFR, Estimated: >60 mL/min (ref >60)
Glucose, Bld: 96 mg/dL (ref 70–99)
Potassium: 3.9 mmol/L (ref 3.5–5.1)
Sodium: 139 mmol/L (ref 135–145)
Total Bilirubin: 0.5 mg/dL (ref 0.3–1.2)
Total Protein: 7.2 g/dL (ref 6.5–8.1)

## 2020-04-12 ENCOUNTER — Telehealth: Payer: Self-pay

## 2020-04-12 LAB — IGG, IGA, IGM
IgA: 213 mg/dL (ref 87–352)
IgG (Immunoglobin G), Serum: 1368 mg/dL (ref 586–1602)
IgM (Immunoglobulin M), Srm: 101 mg/dL (ref 26–217)

## 2020-04-12 NOTE — Telephone Encounter (Signed)
I spoke with pt and advised as indicated. Pt expressed understanding of this information. 

## 2020-04-12 NOTE — Telephone Encounter (Signed)
-----   Message from Benay Pike, MD sent at 04/11/2020  1:15 PM EST ----- Labs reviewed from today.Please let her know that her WBC is low but it has been intermittently low.She can do once a yr lab work with her PCP and they should send her back to [place] if WBC gets lower. This could be normal for some patients also.

## 2020-04-15 DIAGNOSIS — R779 Abnormality of plasma protein, unspecified: Secondary | ICD-10-CM | POA: Diagnosis not present

## 2020-04-17 LAB — UPEP/TP, 24-HR URINE
Albumin, U: 100 %
Alpha 1, Urine: 0 %
Alpha 2, Urine: 0 %
Beta, Urine: 0 %
Gamma Globulin, Urine: 0 %
Total Protein, Urine-Ur/day: 358 mg/24 hr — ABNORMAL HIGH (ref 30–150)
Total Protein, Urine: 14.6 mg/dL
Total Volume: 2450

## 2020-04-24 ENCOUNTER — Telehealth: Payer: Self-pay | Admitting: Family Medicine

## 2020-04-24 NOTE — Telephone Encounter (Signed)
Patient is calling and requesting a refill for lidocaine-prilocaine (EMLA) cream sent to [facility]  [address] [place], [place], [place] [address]  Phone:  [phone] Fax:  [phone]  CB is [phone]

## 2020-04-25 NOTE — Telephone Encounter (Signed)
Pt LOV was on 03/28/2020 and last refill was done on 11/20/2019 for 360 g with 1 refill. Please advise if ok to refill

## 2020-04-26 ENCOUNTER — Other Ambulatory Visit: Payer: Self-pay

## 2020-04-26 DIAGNOSIS — G629 Polyneuropathy, unspecified: Secondary | ICD-10-CM

## 2020-04-26 MED ORDER — LIDOCAINE-PRILOCAINE 2.5-2.5 % EX CREA: TOPICAL_CREAM | CUTANEOUS | 1 refills | Status: DC

## 2020-04-26 NOTE — Telephone Encounter (Signed)
Pt is calling in stating that she is out of the Rx and would like to see if it can be called in so that she will have it for the holidays.  Rx:  Lidocaine-prilocaine (EMLA) cream Pharm:  [facility]

## 2020-04-26 NOTE — Telephone Encounter (Signed)
Rx sent to pt pharmacy as requested 

## 2020-05-01 ENCOUNTER — Telehealth: Payer: Self-pay | Admitting: Family Medicine

## 2020-05-01 NOTE — Telephone Encounter (Signed)
Pt is calling in stating that she is not able to get Rx lidocaine-prilocaine (EMLA) cream the pharmacy told her that they are out of stock and they would like to replace it with Rx lidocaine ointment.   Pharm:  [facility] [place] [place].  Pt state that she is out of the medication and would like to see if someone can call it in today.

## 2020-05-06 ENCOUNTER — Other Ambulatory Visit: Payer: Self-pay | Admitting: Family Medicine

## 2020-05-06 MED ORDER — LIDOCAINE 5 % EX OINT
1.0000 | TOPICAL_OINTMENT | Freq: Three times a day (TID) | CUTANEOUS | 1 refills | Status: DC | PRN
Start: 2020-05-06 — End: 2020-08-01

## 2020-05-06 NOTE — Telephone Encounter (Signed)
Patient is calling back to check the status of previous message, please advise.CB is [phone]

## 2020-05-06 NOTE — Telephone Encounter (Signed)
I sent in the lidocaine for her.

## 2020-05-06 NOTE — Telephone Encounter (Signed)
Pt is aware.  

## 2020-05-06 NOTE — Telephone Encounter (Signed)
Can you advise in Dr. Volanda Napoleon & Dr. Doug Sou absence?

## 2020-06-07 ENCOUNTER — Telehealth: Payer: Self-pay | Admitting: Family Medicine

## 2020-06-07 NOTE — Telephone Encounter (Signed)
lidocaine-prilocaine (EMLA) cream    [facility] [place], [place], [address] Phone:  [phone]  Fax:  [phone]

## 2020-06-10 ENCOUNTER — Other Ambulatory Visit: Payer: Self-pay | Admitting: Family Medicine

## 2020-06-10 DIAGNOSIS — G6289 Other specified polyneuropathies: Secondary | ICD-10-CM

## 2020-06-10 MED ORDER — LIDOCAINE-PRILOCAINE 2.5-2.5 % EX CREA: TOPICAL_CREAM | CUTANEOUS | 11 refills | Status: DC

## 2020-06-10 NOTE — Telephone Encounter (Signed)
Patient is almost out of this medication and they need a new Rx

## 2020-06-10 NOTE — Telephone Encounter (Signed)
Rx sent 

## 2020-06-10 NOTE — Telephone Encounter (Signed)
Pr LOV was on 03/28/2020 and last refill was done on 05/06/2020, Please advise if ok to send refill

## 2020-06-11 NOTE — Telephone Encounter (Signed)
FYI Spoke with pt this morning, state that her pharmacy does not have the cream in stock but state that they have the Ointment in stock, Pt request for another new Rx sent to the pharmacy. Pt has a f/u appointment tomorrow 06/12/2020 at 1.30 pm state that she will discuss this with Dr Volanda Napoleon at the visit

## 2020-06-12 ENCOUNTER — Ambulatory Visit (INDEPENDENT_AMBULATORY_CARE_PROVIDER_SITE_OTHER): Payer: Medicare Other | Admitting: Family Medicine

## 2020-06-12 ENCOUNTER — Other Ambulatory Visit: Payer: Self-pay

## 2020-06-12 ENCOUNTER — Encounter: Payer: Self-pay | Admitting: Family Medicine

## 2020-06-12 VITALS — BP 102/68 | HR 118 | Temp 98.4°F | Wt 250.8 lb

## 2020-06-12 DIAGNOSIS — M48061 Spinal stenosis, lumbar region without neurogenic claudication: Secondary | ICD-10-CM

## 2020-06-12 DIAGNOSIS — R000 Tachycardia, unspecified: Secondary | ICD-10-CM

## 2020-06-12 DIAGNOSIS — G609 Hereditary and idiopathic neuropathy, unspecified: Secondary | ICD-10-CM

## 2020-06-12 MED ORDER — LIDOCAINE 5 % EX OINT: 1.0000 "application " | TOPICAL_OINTMENT | CUTANEOUS | 3 refills | Status: DC | PRN

## 2020-06-12 NOTE — Patient Instructions (Signed)
[employer] neurological surgery (7th ed., pp. [phone]). [place], [place]: Elsevier."> [employer] neurological surgery (7th ed., pp. [phone]). [place], [place]: Elsevier.">  Spinal Stenosis  Spinal stenosis is a condition that happens when the spinal canal narrows. The spinal canal is the space between the bones of your spine (vertebrae). This narrowing puts pressure on the spinal cord or nerves. Spinal stenosis can affect the vertebrae in the neck, upper back, and lower back. This condition can range from mild to severe. In some cases, there are no symptoms. What are the causes? This condition is caused by areas of bone pushing into the spinal canal. This condition may be present at birth (congenital), or it may be caused by:  Slow breakdown of your vertebrae (spinal degeneration). This usually starts around 72 years of age.  Injury (trauma) to your spine.  Tumors in your spine.  Calcium deposits in your spine. What increases the risk? The following factors may make you more likely to develop this condition:  Being older than age 50.  Having a problem present at birth with an abnormally shaped spine (congenitalspinal deformity), such as scoliosis.  Having arthritis. What are the signs or symptoms? Symptoms of this condition include:  Pain in the neck or back that is generally worse with activities, particularly when you stand or walk.  Numbness, tingling, hot or cold sensations, weakness, or tiredness (fatigue) in your leg or legs.  Pain going from the buttock, down the thigh, and to the calf (sciatica). This can happen in one or both legs.  Frequent episodes of falling.  A foot-slapping gait that leads to muscle weakness. In more severe cases, you may develop:  Problems having a bowel movement or urinating.  Difficulty having sex.  Loss of feeling in your legs and inability to walk. Symptoms may come on slowly and get worse over time. In some cases, there  are no symptoms. How is this diagnosed? This condition is diagnosed based on your medical history and a physical exam. You will also have tests, such as an MRI, a CT scan, or an X-ray. How is this treated? Treatment for this condition often focuses on managing your pain and any other symptoms. Treatment may include:  Practicing good posture to lessen pressure on your nerves.  Exercising to strengthen muscles, build endurance, improve balance, and maintain range of motion. This may include physical therapy to restore movement and strength to your back.  Losing weight, if needed.  Medicines to reduce inflammation or pain. This may include a medicine that is injected into your spine (steroidinjection).  Assistive devices, such as a corset or brace. In some cases, surgery may be needed. The most common procedure is decompression laminectomy. This is done to remove excess bone that puts pressure on your nerve roots. Follow these instructions at home: Managing pain, stiffness, and swelling  Practice good posture. If you were given a brace or a corset, wear it as told by your health care provider.  Maintain a healthy weight. Talk with your health care provider if you need help losing weight.  If directed, apply heat to the affected area as often as told by your health care provider. Use the heat source that your health care provider recommends, such as a moist heat pack or a heating pad. ? Place a towel between your skin and the heat source. ? Leave the heat on for 20-30 minutes. ? Remove the heat if your skin turns bright red. This is especially important if   you are unable to feel pain, heat, or cold. You may have a greater risk of getting burned.   Activity  Do all exercises and stretches as told by your health care provider.  Do not do any activities that cause pain. Ask your health care provider what activities are safe for you.  Do not lift anything that is heavier than 10 lb (4.5 kg),  or the limit that you are told by your health care provider.  Return to your normal activities as told by your health care provider. Ask your health care provider what activities are safe for you. General instructions  Take over-the-counter and prescription medicines only as told by your health care provider.  Do not use any products that contain nicotine or tobacco, such as cigarettes, e-cigarettes, and chewing tobacco. If you need help quitting, ask your health care provider.  Eat a healthy diet. This includes plenty of fruits and vegetables, whole grains, and low-fat (lean) protein.  Keep all follow-up visits as told by your health care provider. This is important. Contact a health care provider if:  Your symptoms do not get better or they get worse.  You have a fever. Get help right away if:  You have new pain or symptoms of severe pain, such as: ? New or worsening pain in your neck or upper back. ? Severe pain that cannot be controlled with medicines. ? A severe headache that gets worse when you stand.  You are dizzy.  You have vision problems, such as blurred vision or double vision.  You have nausea or you vomit.  You develop new or worsening numbness or tingling in your back or legs.  You have pain, redness, swelling, or warmth in your arm or leg. Summary  Spinal stenosis is a condition that happens when the spinal canal narrows. The spinal canal is the space between the bones of your spine (vertebrae). This narrowing puts pressure on the spinal cord or nerves.  This condition may be caused by a birth defect, breakdown of your vertebrae, trauma, tumors, or calcium deposits.  Spinal stenosis can cause numbness, weakness, or pain in the buttocks, neck, back, and legs.  This condition is usually diagnosed with your medical history, a physical exam, and tests, such as an MRI, a CT scan, or an X-ray. This information is not intended to replace advice given to you by your  health care provider. Make sure you discuss any questions you have with your health care provider. Document Revised: 02/23/2019 Document Reviewed: 02/23/2019 Elsevier Patient Education  2021 [place] [employer] neurological surgery (7th ed., pp. [phone]). [place], [place]: Elsevier."> [place] and [employer] neurological surgery (7th ed., pp. [phone]). [place], [place]: Elsevier.">  Spinal Stenosis  Spinal stenosis is a condition that happens when the spinal canal narrows. The spinal canal is the space between the bones of your spine (vertebrae). This narrowing puts pressure on the spinal cord or nerves. Spinal stenosis can affect the vertebrae in the neck, upper back, and lower back. This condition can range from mild to severe. In some cases, there are no symptoms. What are the causes? This condition is caused by areas of bone pushing into the spinal canal. This condition may be present at birth (congenital), or it may be caused by:  Slow breakdown of your vertebrae (spinal degeneration). This usually starts around 72 years of age.  Injury (trauma) to your spine.  Tumors in your spine.  Calcium deposits in your spine. What increases the  risk? The following factors may make you more likely to develop this condition:  Being older than age 29.  Having a problem present at birth with an abnormally shaped spine (congenitalspinal deformity), such as scoliosis.  Having arthritis. What are the signs or symptoms? Symptoms of this condition include:  Pain in the neck or back that is generally worse with activities, particularly when you stand or walk.  Numbness, tingling, hot or cold sensations, weakness, or tiredness (fatigue) in your leg or legs.  Pain going from the buttock, down the thigh, and to the calf (sciatica). This can happen in one or both legs.  Frequent episodes of falling.  A foot-slapping gait that leads to muscle weakness. In more severe cases, you may  develop:  Problems having a bowel movement or urinating.  Difficulty having sex.  Loss of feeling in your legs and inability to walk. Symptoms may come on slowly and get worse over time. In some cases, there are no symptoms. How is this diagnosed? This condition is diagnosed based on your medical history and a physical exam. You will also have tests, such as an MRI, a CT scan, or an X-ray. How is this treated? Treatment for this condition often focuses on managing your pain and any other symptoms. Treatment may include:  Practicing good posture to lessen pressure on your nerves.  Exercising to strengthen muscles, build endurance, improve balance, and maintain range of motion. This may include physical therapy to restore movement and strength to your back.  Losing weight, if needed.  Medicines to reduce inflammation or pain. This may include a medicine that is injected into your spine (steroidinjection).  Assistive devices, such as a corset or brace. In some cases, surgery may be needed. The most common procedure is decompression laminectomy. This is done to remove excess bone that puts pressure on your nerve roots. Follow these instructions at home: Managing pain, stiffness, and swelling  Practice good posture. If you were given a brace or a corset, wear it as told by your health care provider.  Maintain a healthy weight. Talk with your health care provider if you need help losing weight.  If directed, apply heat to the affected area as often as told by your health care provider. Use the heat source that your health care provider recommends, such as a moist heat pack or a heating pad. ? Place a towel between your skin and the heat source. ? Leave the heat on for 20-30 minutes. ? Remove the heat if your skin turns bright red. This is especially important if you are unable to feel pain, heat, or cold. You may have a greater risk of getting burned.   Activity  Do all exercises and  stretches as told by your health care provider.  Do not do any activities that cause pain. Ask your health care provider what activities are safe for you.  Do not lift anything that is heavier than 10 lb (4.5 kg), or the limit that you are told by your health care provider.  Return to your normal activities as told by your health care provider. Ask your health care provider what activities are safe for you. General instructions  Take over-the-counter and prescription medicines only as told by your health care provider.  Do not use any products that contain nicotine or tobacco, such as cigarettes, e-cigarettes, and chewing tobacco. If you need help quitting, ask your health care provider.  Eat a healthy diet. This includes plenty of fruits and  vegetables, whole grains, and low-fat (lean) protein.  Keep all follow-up visits as told by your health care provider. This is important. Contact a health care provider if:  Your symptoms do not get better or they get worse.  You have a fever. Get help right away if:  You have new pain or symptoms of severe pain, such as: ? New or worsening pain in your neck or upper back. ? Severe pain that cannot be controlled with medicines. ? A severe headache that gets worse when you stand.  You are dizzy.  You have vision problems, such as blurred vision or double vision.  You have nausea or you vomit.  You develop new or worsening numbness or tingling in your back or legs.  You have pain, redness, swelling, or warmth in your arm or leg. Summary  Spinal stenosis is a condition that happens when the spinal canal narrows. The spinal canal is the space between the bones of your spine (vertebrae). This narrowing puts pressure on the spinal cord or nerves.  This condition may be caused by a birth defect, breakdown of your vertebrae, trauma, tumors, or calcium deposits.  Spinal stenosis can cause numbness, weakness, or pain in the buttocks, neck, back,  and legs.  This condition is usually diagnosed with your medical history, a physical exam, and tests, such as an MRI, a CT scan, or an X-ray. This information is not intended to replace advice given to you by your health care provider. Make sure you discuss any questions you have with your health care provider. Document Revised: 02/23/2019 Document Reviewed: 02/23/2019 Elsevier Patient Education  2021 [place] [employer]'s neurology in clinical practice (8th ed., pp. [phone]). Elsevier."> [employer] medicine (26th ed., pp. [phone]). Elsevier.">  Peripheral Neuropathy Peripheral neuropathy is a type of nerve damage. It affects nerves that carry signals between the spinal cord and the arms, legs, and the rest of the body (peripheral nerves). It does not affect nerves in the spinal cord or brain. In peripheral neuropathy, one nerve or a group of nerves may be damaged. Peripheral neuropathy is a broad category that includes many specific nerve disorders, like diabetic neuropathy, hereditary neuropathy, and carpal tunnel syndrome. What are the causes? This condition may be caused by:  Diabetes. This is the most common cause of peripheral neuropathy.  Nerve injury.  Pressure or stress on a nerve that lasts a long time.  Lack (deficiency) of B vitamins. This can result from alcoholism, poor diet, or a restricted diet.  Infections.  Autoimmune diseases, such as rheumatoid arthritis and systemic lupus erythematosus.  Nerve diseases that are passed from parent to child (inherited).  Some medicines, such as cancer medicines (chemotherapy).  Poisonous (toxic) substances, such as lead and mercury.  Too little blood flowing to the legs.  Kidney disease.  Thyroid disease. In some cases, the cause of this condition is not known. What are the signs or symptoms? Symptoms of this condition depend on which of your nerves is damaged. Common symptoms include:  Loss of  feeling (numbness) in the feet, hands, or both.  Tingling in the feet, hands, or both.  Burning pain.  Very sensitive skin.  Weakness.  Not being able to move a part of the body (paralysis).  Muscle twitching.  Clumsiness or poor coordination.  Loss of balance.  Not being able to control your bladder.  Feeling dizzy.  Sexual problems. How is this diagnosed? Diagnosing and finding the cause of peripheral neuropathy  can be difficult. Your health care provider will take your medical history and do a physical exam. A neurological exam will also be done. This involves checking things that are affected by your brain, spinal cord, and nerves (nervous system). For example, your health care provider will check your reflexes, how you move, and what you can feel. You may have other tests, such as:  Blood tests.  Electromyogram (EMG) and nerve conduction tests. These tests check nerve function and how well the nerves are controlling the muscles.  Imaging tests, such as CT scans or MRI to rule out other causes of your symptoms.  Removing a small piece of nerve to be examined in a lab (nerve biopsy).  Removing and examining a small amount of the fluid that surrounds the brain and spinal cord (lumbar puncture). How is this treated? Treatment for this condition may involve:  Treating the underlying cause of the neuropathy, such as diabetes, kidney disease, or vitamin deficiencies.  Stopping medicines that can cause neuropathy, such as chemotherapy.  Medicine to help relieve pain. Medicines may include: ? Prescription or over-the-counter pain medicine. ? Antiseizure medicine. ? Antidepressants. ? Pain-relieving patches that are applied to painful areas of skin.  Surgery to relieve pressure on a nerve or to destroy a nerve that is causing pain.  Physical therapy to help improve movement and balance.  Devices to help you move around (assistive devices). Follow these instructions at  home: Medicines  Take over-the-counter and prescription medicines only as told by your health care provider. Do not take any other medicines without first asking your health care provider.  Do not drive or use heavy machinery while taking prescription pain medicine. Lifestyle  Do not use any products that contain nicotine or tobacco, such as cigarettes and e-cigarettes. Smoking keeps blood from reaching damaged nerves. If you need help quitting, ask your health care provider.  Avoid or limit alcohol. Too much alcohol can cause a vitamin B deficiency, and vitamin B is needed for healthy nerves.  Eat a healthy diet. This includes: ? Eating foods that are high in fiber, such as fresh fruits and vegetables, whole grains, and beans. ? Limiting foods that are high in fat and processed sugars, such as fried or sweet foods.   General instructions  If you have diabetes, work closely with your health care provider to keep your blood sugar under control.  If you have numbness in your feet: ? Check every day for signs of injury or infection. Watch for redness, warmth, and swelling. ? Wear padded socks and comfortable shoes. These help protect your feet.  Develop a good support system. Living with peripheral neuropathy can be stressful. Consider talking with a mental health specialist or joining a support group.  Use assistive devices and attend physical therapy as told by your health care provider. This may include using a walker or a cane.  Keep all follow-up visits as told by your health care provider. This is important.   Contact a health care provider if:  You have new signs or symptoms of peripheral neuropathy.  You are struggling emotionally from dealing with peripheral neuropathy.  Your pain is not well-controlled. Get help right away if:  You have an injury or infection that is not healing normally.  You develop new weakness in an arm or leg.  You have fallen or do so  frequently. Summary  Peripheral neuropathy is when the nerves in the arms, or legs are damaged, resulting in numbness, weakness, or  pain.  There are many causes of peripheral neuropathy, including diabetes, pinched nerves, vitamin deficiencies, autoimmune disease, and hereditary conditions.  Diagnosing and finding the cause of peripheral neuropathy can be difficult. Your health care provider will take your medical history, do a physical exam, and do tests, including blood tests and nerve function tests.  Treatment involves treating the underlying cause of the neuropathy and taking medicines to help control pain. Physical therapy and assistive devices may also help. This information is not intended to replace advice given to you by your health care provider. Make sure you discuss any questions you have with your health care provider. Document Revised: 02/06/2020 Document Reviewed: 02/06/2020 Elsevier Patient Education  2021 [employer].

## 2020-06-12 NOTE — Progress Notes (Signed)
Subjective:    Patient ID: Katherine [name], female    DOB: 09/24/1950, 70 y.o.   MRN: [id number]  No chief complaint on file.   HPI Patient was seen today for f/u.  Pt with low back pain.  Has a h/o spinal stenosis and idiopathic peripheral neuropathy.  Pt using lidocaine cream, but had to change to ointment due to supply issues a the pharmacy.  Pt has not been able to get enough cream or ointment  for a 90 day supply from the pharmacy.  States 12 tubes would be a 90 day supply.  Also taking Nortriptyline and Lyrica.  Pt seen by Dr. Jannifer Franklin at [facility]. Is not interested in another EMG/NCS.    Past Medical History:  Diagnosis Date  . Complication of anesthesia     neuropathy in feet flares after surgery from PAS hose. needs cream on feet  . DDD (degenerative disc disease), lumbar 2012  . Dental crowns present   . GERD (gastroesophageal reflux disease)    occ, no medications at this time  . History of anemia 1991   not since hysterectomy  . History of gross hematuria 2002   with kidney stones  . History of hyperparathyroidism   . History of kidney stones   . Hypertension    states under control with meds., has been on med. x 20 yr.  . Impingement syndrome of right shoulder 2017  . Lymphedema of both lower extremities    due to neuropathy  . Lymphedema of both upper extremities    due to neuropathy  . Obese   . Osteoarthritis   . Peripheral neuropathy    hands and feet  . Sciatica   . Triangular fibrocartilage complex tear 06/2017   right  . 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 Wears partial dentures    upper and lower    Allergies  Allergen Reactions  . Erythromycin Other (See Comments)    CHEST PAIN    ROS General: Denies fever, chills, night sweats, changes in weight, changes in appetite HEENT: Denies headaches, ear pain, changes in vision, rhinorrhea, sore throat CV: Denies CP, palpitations, SOB, orthopnea Pulm: Denies SOB, cough, wheezing GI: Denies abdominal  pain, nausea, vomiting, diarrhea, constipation GU: Denies dysuria, hematuria, frequency, vaginal discharge Msk: Denies muscle cramps, joint pains  + low back pain Neuro: Denies weakness  +neuropathy Skin: Denies rashes, bruising Psych: Denies depression, anxiety, hallucinations    Objective:    Blood pressure 102/68, pulse (!) 118, temperature 98.4 F (36.9 C), temperature source Oral, weight 250 lb 12.8 oz (113.8 kg), SpO2 98 %.  Gen. Pleasant, well-nourished, in no distress, normal affect   HEENT: [place]/AT, face symmetric, conjunctiva clear, no scleral icterus, PERRLA, EOMI, nares patent without drainage Lungs: no accessory muscle use, CTAB, no wheezes or rales Cardiovascular: Tachycardia, no m/r/g, no peripheral edema Musculoskeletal: No deformities, no cyanosis or clubbing, normal tone Neuro:  A&Ox3, CN II-XII intact, normal gait Skin:  Warm, no lesions/ rash   Wt Readings from Last 3 Encounters:  06/12/20 250 lb 12.8 oz (113.8 kg)  04/11/20 241 lb 1.6 oz (109.4 kg)  01/24/20 229 lb (103.9 kg)    Lab Results  Component Value Date   WBC 3.5 (L) 04/11/2020   HGB 13.0 04/11/2020   HCT 39.2 04/11/2020   PLT 145 (L) 04/11/2020   GLUCOSE 96 04/11/2020   CHOL 198 07/22/2018   TRIG 143.0 07/22/2018   HDL 41.50 07/22/2018   LDLDIRECT 139.1 02/11/2011  LDLCALC 128 (H) 07/22/2018   ALT 32 04/11/2020   AST 59 (H) 04/11/2020   NA 139 04/11/2020   K 3.9 04/11/2020   CL 104 04/11/2020   CREATININE 0.90 04/11/2020   BUN 14 04/11/2020   CO2 29 04/11/2020   TSH 1.300 02/09/2019   HGBA1C 5.7 11/03/2019    Assessment/Plan:  Hereditary and idiopathic peripheral neuropathy  -continue current meds including lyrica 150 mg BID, nortriptyline 75 mg qhs, and topical lidocaine -will send in new rx for 90 d supply of lidocaine  -continue f/u with Neurology  - Plan: lidocaine (XYLOCAINE) 5 % ointment  Spinal stenosis of lumbar region, unspecified whether neurogenic claudication  present -continue supportive care. -consider PT -continue f/u with Neurology  Tachycardia -likely 2/2 pain -continue to monitor  F/u prn  Grier Mitts, MD

## 2020-06-13 ENCOUNTER — Other Ambulatory Visit: Payer: Self-pay

## 2020-06-13 ENCOUNTER — Telehealth: Payer: Self-pay

## 2020-06-13 DIAGNOSIS — G609 Hereditary and idiopathic neuropathy, unspecified: Secondary | ICD-10-CM

## 2020-06-13 MED ORDER — LIDOCAINE 5 % EX OINT: 1.0000 "application " | TOPICAL_OINTMENT | Freq: Three times a day (TID) | CUTANEOUS | 3 refills | Status: DC | PRN

## 2020-06-13 NOTE — Telephone Encounter (Signed)
Pt pharmacy needed clarification on pt Lidocaine Ointment, clarified order with pharmacist with directions from  Previous prescription 3 times prn, also requested to expedite the order since pt was out of Rx.Pt was Notified

## 2020-06-19 ENCOUNTER — Other Ambulatory Visit: Payer: Self-pay | Admitting: Family Medicine

## 2020-06-19 DIAGNOSIS — G609 Hereditary and idiopathic neuropathy, unspecified: Secondary | ICD-10-CM

## 2020-06-20 ENCOUNTER — Telehealth: Payer: Self-pay | Admitting: Family Medicine

## 2020-06-20 NOTE — Telephone Encounter (Signed)
Pt LOV was on 07/02/2020 and last refill was done on 01/26/2020 for 60 tablets with 3 refills, ok to send refill

## 2020-06-20 NOTE — Telephone Encounter (Signed)
Pt is calling in stating that she needs a call back so she can elaborate on her medications and what the insurance is requiring of you so that she is able to get them.

## 2020-06-21 ENCOUNTER — Other Ambulatory Visit: Payer: Self-pay

## 2020-06-21 DIAGNOSIS — G609 Hereditary and idiopathic neuropathy, unspecified: Secondary | ICD-10-CM

## 2020-06-21 NOTE — Telephone Encounter (Signed)
Pt states that she will need refill on the ointment with the new quantity for next month

## 2020-06-21 NOTE — Telephone Encounter (Signed)
Spoke with pt state  that her insurance will only cover 152 grams monthly supply for her Lidocaine Ointment, note added to pharmacy

## 2020-06-27 ENCOUNTER — Other Ambulatory Visit: Payer: Self-pay

## 2020-06-27 DIAGNOSIS — G609 Hereditary and idiopathic neuropathy, unspecified: Secondary | ICD-10-CM

## 2020-06-27 MED ORDER — LIDOCAINE 5 % EX OINT: 1.0000 "application " | TOPICAL_OINTMENT | Freq: Three times a day (TID) | CUTANEOUS | 3 refills | Status: DC | PRN

## 2020-06-27 NOTE — Telephone Encounter (Signed)
Okay for refill?  

## 2020-07-01 NOTE — Telephone Encounter (Signed)
Rx already sent.

## 2020-07-31 ENCOUNTER — Telehealth: Payer: Self-pay | Admitting: Family Medicine

## 2020-07-31 NOTE — Telephone Encounter (Signed)
Patient is calling and is requesting a refill for lidocaine (XYLOCAINE) 5 % ointment for 90 days and pt is requesting 12 tubes, please advise. CB is [phone]

## 2020-08-01 MED ORDER — LIDOCAINE 5 % EX OINT: 1.0000 "application " | TOPICAL_OINTMENT | Freq: Three times a day (TID) | CUTANEOUS | 11 refills | Status: DC | PRN

## 2020-08-01 NOTE — Telephone Encounter (Signed)
The patient called stating that the wrong Rx refilled and also sent to the wrong pharmacy.  She stated that it is the lidocaine and she had another call come in and did not get the pharmacy information.  The patient just wants the nurse to call her to get the correct information.  Please advise

## 2020-08-02 MED ORDER — LIDOCAINE 5 % EX OINT: 1.0000 "application " | TOPICAL_OINTMENT | Freq: Three times a day (TID) | CUTANEOUS | 2 refills | Status: DC | PRN

## 2020-08-02 NOTE — Telephone Encounter (Signed)
Spoke with patient, got clarification on Rx and pharmacy. Refill sent to [facility] mail order.

## 2020-08-02 NOTE — Addendum Note (Signed)
Addended by: Anderson Malta on: 08/02/2020 11:02 AM   Modules accepted: Orders

## 2020-08-14 ENCOUNTER — Telehealth: Payer: Self-pay | Admitting: Family Medicine

## 2020-08-14 ENCOUNTER — Other Ambulatory Visit: Payer: Self-pay

## 2020-08-14 DIAGNOSIS — G6289 Other specified polyneuropathies: Secondary | ICD-10-CM

## 2020-08-14 MED ORDER — LIDOCAINE-PRILOCAINE 2.5-2.5 % EX CREA: TOPICAL_CREAM | CUTANEOUS | 11 refills | Status: DC

## 2020-08-14 NOTE — Telephone Encounter (Signed)
Patient is calling and wanted to let the provider know that she is no longer using [facility] any longer and if all of her prescriptions can be sent to [facility]. Also patient is requesting a refill for lidocaine-prilocaine (EMLA) cream to be sent to [facility] 12 month supply, please advise. CB is [phone]

## 2020-08-14 NOTE — Telephone Encounter (Signed)
Spoke with patient, advised the Rx was sent to mail order pharmacy.

## 2020-09-03 ENCOUNTER — Other Ambulatory Visit: Payer: Self-pay | Admitting: Family Medicine

## 2020-10-14 ENCOUNTER — Other Ambulatory Visit: Payer: Self-pay | Admitting: Family Medicine

## 2020-10-14 DIAGNOSIS — G609 Hereditary and idiopathic neuropathy, unspecified: Secondary | ICD-10-CM

## 2020-10-18 ENCOUNTER — Telehealth: Payer: Self-pay | Admitting: Family Medicine

## 2020-10-18 NOTE — Telephone Encounter (Signed)
Pt call and stated she need a refill on  pregabalin (LYRICA) 150 MG capsule sent to  [facility]  ([place], [address], [address] Phone:  [phone]  Fax:  [phone]    Pt stated that [facility]  told her that dr.Banks had denied her refill on Pregabalin.

## 2020-10-30 ENCOUNTER — Other Ambulatory Visit: Payer: Self-pay | Admitting: Family Medicine

## 2020-10-30 DIAGNOSIS — G609 Hereditary and idiopathic neuropathy, unspecified: Secondary | ICD-10-CM

## 2020-10-30 MED ORDER — PREGABALIN 150 MG PO CAPS: 150.0000 mg | ORAL_CAPSULE | Freq: Two times a day (BID) | ORAL | 3 refills | Status: DC

## 2020-11-06 ENCOUNTER — Other Ambulatory Visit: Payer: Self-pay | Admitting: *Deleted

## 2020-11-06 NOTE — Telephone Encounter (Signed)
[facility] calling because the refill sent out on 10/22/20 has been lost.  There is an investigation with [facility] and [facility].  Dr Volanda Napoleon has given verbal okay to refill medication.  [facility] will be in contact as far as the outcome of the investigation.

## 2020-11-12 ENCOUNTER — Other Ambulatory Visit: Payer: Self-pay | Admitting: Family Medicine

## 2020-11-21 LAB — HM MAMMOGRAPHY

## 2020-12-10 ENCOUNTER — Encounter: Payer: Self-pay | Admitting: Family Medicine

## 2020-12-31 ENCOUNTER — Telehealth: Payer: Self-pay

## 2020-12-31 ENCOUNTER — Ambulatory Visit: Payer: Medicare Other

## 2020-12-31 NOTE — Progress Notes (Deleted)
Subjective:   Katherine [name] is a 70 y.o. female who presents for Medicare Annual (Subsequent) preventive examination.   I connected with Katherine [name] today by telephone and verified that I am speaking with the correct person using two identifiers. Location patient: home Location provider: work Persons participating in the virtual visit: patient, provider.   I discussed the limitations, risks, security and privacy concerns of performing an evaluation and management service by telephone and the availability of in person appointments. I also discussed with the patient that there may be a patient responsible charge related to this service. The patient expressed understanding and verbally consented to this telephonic visit.    Interactive audio and video telecommunications were attempted between this provider and patient, however failed, due to patient having technical difficulties OR patient did not have access to video capability.  We continued and completed visit with audio only.    Review of Systems    N/a       Objective:    There were no vitals filed for this visit. There is no height or weight on file to calculate BMI.  Advanced Directives 12/28/2019 11/28/2018 05/02/2018 04/26/2018 07/28/2017 07/23/2017 07/02/2017  Does Patient Have a Medical Advance Directive? Yes No Yes Yes No No No  Type of Paramedic of [place];Living will - [place];Living will [place];Living will - - -  Does patient want to make changes to medical advance directive? No - Patient declined - No - Patient declined No - Patient declined - - -  Copy of [place] in Chart? Yes - validated most recent copy scanned in chart (See row information) - Yes - validated most recent copy scanned in chart (See row information) Yes - validated most recent copy scanned in chart (See row information) - - -  Would patient like information on  creating a medical advance directive? - No - Patient declined - - No - Patient declined - No - Patient declined    Current Medications (verified) Outpatient Encounter Medications as of 12/31/2020  Medication Sig   Cholecalciferol (VITAMIN D) 50 MCG (2000 UT) tablet Take 2,000 Units by mouth daily.    cycloSPORINE (RESTASIS) 0.05 % ophthalmic emulsion Place 1 drop into both eyes 2 (two) times daily.   hydrochlorothiazide (HYDRODIURIL) 12.5 MG tablet Take 12.5 mg by mouth daily.   L-Methylfolate-Algae-B12-B6 (METANX) 3-90.314-2-35 MG CAPS TAKE 1 CAPSULE BY MOUTH TWO TIMES A DAY   lidocaine (XYLOCAINE) 5 % ointment Apply 1 application topically 3 (three) times daily as needed.   lidocaine-prilocaine (EMLA) cream Apply 1 application topically to affected areas 3 times daily as needed.   lisinopril (ZESTRIL) 10 MG tablet TAKE 1 TABLET BY MOUTH  DAILY   Multiple Vitamin (MULTIVITAMIN) tablet Take 1 tablet by mouth daily.   nortriptyline (PAMELOR) 25 MG capsule TAKE 3 CAPSULES BY MOUTH AT BEDTIME   pregabalin (LYRICA) 150 MG capsule Take 1 capsule (150 mg total) by mouth 2 (two) times daily.   traMADol (ULTRAM) 50 MG tablet Take one to two tablets by mouth every 6 hours if needed for pain, Prescribed by Neurology   No facility-administered encounter medications on file as of 12/31/2020.    Allergies (verified) Erythromycin   History: Past Medical History:  Diagnosis Date   Complication of anesthesia     neuropathy in feet flares after surgery from PAS hose. needs cream on feet   DDD (degenerative disc disease), lumbar 2012   Dental  crowns present    GERD (gastroesophageal reflux disease)    occ, no medications at this time   History of anemia 1991   not since hysterectomy   History of gross hematuria 2002   with kidney stones   History of hyperparathyroidism    History of kidney stones    Hypertension    states under control with meds., has been on med. x 20 yr.   Impingement syndrome  of right shoulder 2017   Lymphedema of both lower extremities    due to neuropathy   Lymphedema of both upper extremities    due to neuropathy   Obese    Osteoarthritis    Peripheral neuropathy    hands and feet   Sciatica    Triangular fibrocartilage complex tear 06/2017   right   Ulnar impaction syndrome, right 06/2017   Wears partial dentures    upper and lower   Past Surgical History:  Procedure Laterality Date   ABDOMINAL HYSTERECTOMY  1991   partial   CARPAL TUNNEL RELEASE Right 08/09/2002   CARPAL TUNNEL RELEASE Left 11/03/2006   COLONOSCOPY  01/2016   COLONOSCOPY WITH PROPOFOL  11/24/2011   CYSTOSCOPY WITH RETROGRADE PYELOGRAM, URETEROSCOPY AND STENT PLACEMENT Left 12/18/2014   Procedure: CYSTOSCOPY WITH RETROGRADE PYELOGRAM, URETEROSCOPY AND STENT PLACEMENT left ureter;  Surgeon: Raynelle Bring, MD;  Location: [facility];  Service: Urology;  Laterality: Left;   DILATION AND CURETTAGE OF UTERUS     ELBOW ARTHROSCOPY Right    EXTRACORPOREAL SHOCK WAVE LITHOTRIPSY Left 03/08/2017   Procedure: LEFT EXTRACORPOREAL SHOCK WAVE LITHOTRIPSY (ESWL);  Surgeon: Raynelle Bring, MD;  Location: [facility];  Service: Urology;  Laterality: Left;   HOLMIUM LASER APPLICATION Left [id number]   Procedure: HOLMIUM LASER APPLICATION left ureter ;  Surgeon: Raynelle Bring, MD;  Location: [facility];  Service: Urology;  Laterality: Left;   PARATHYROIDECTOMY Right 05/03/2015   Procedure: RIGHT SUPERIOR PARATHYROIDECTOMY;  Surgeon: Armandina Gemma, MD;  Location: [place];  Service: General;  Laterality: Right;   PATELLA REALIGNMENT Bilateral    as a teenager   ROTATOR CUFF REPAIR Right    x2   SHOULDER ARTHROSCOPY Right    x 2   SHOULDER ARTHROSCOPY W/ ROTATOR CUFF REPAIR Left 04/26/2002   SYNOVECTOMY WITH POLY EXCHANGE Left 11/28/2018   Procedure: SYNOVECTOMY WITH Posey Pronto;  Surgeon: Vickey Huger, MD;  Location: [facility];  Service: Orthopedics;  Laterality: Left;   TONSILLECTOMY     TOTAL KNEE ARTHROPLASTY Left  09/10/2003   TOTAL KNEE ARTHROPLASTY Right 11/10/2004   TOTAL KNEE ARTHROPLASTY Right 05/02/2018   Procedure: Right polyethelene exchange and open synovectomy;  Surgeon: Vickey Huger, MD;  Location: [facility];  Service: Orthopedics;  Laterality: Right;   TRIGGER FINGER RELEASE Right 08/06/2008   ring finger   TRIGGER FINGER RELEASE     multiple - right thumb, left index/long/ring/thumb   ULNA OSTEOTOMY Right 07/28/2017   Procedure: REVISION RIGHT ULNAR SHORTENING OSTEOTOMY;  Surgeon: Leandrew Koyanagi, MD;  Location: [facility];  Service: Orthopedics;  Laterality: Right;   WRIST ARTHROSCOPY Left 06/16/2000   WRIST ARTHROSCOPY WITH ULNA SHORTENING Right 07/07/2017   Procedure: RIGHT WRIST ARTHROSCOPY WITH ULNAR SHORTENING OSTEOTOMY;  Surgeon: Leandrew Koyanagi, MD;  Location: [place];  Service: Orthopedics;  Laterality: Right;   Family History  Problem Relation Age of Onset   Stroke Mother    Cancer Mother 51       bone cancer   Other Father  unknown medical history   Diabetes Sister    Social History   Socioeconomic History   Marital status: Single    Spouse name: Not on file   Number of children: 1   Years of education: some college   Highest education level: Not on file  Occupational History   Occupation: Retired in 2011    Employer: [employer]  Tobacco Use   Smoking status: Never   Smokeless tobacco: Never  Vaping Use   Vaping Use: Never used  Substance and Sexual Activity   Alcohol use: Not Currently   Drug use: Never   Sexual activity: Never    Birth control/protection: Surgical  Other Topics Concern   Not on file  Social History Narrative   Patient is single and lives with her brother.    Patient is right handed.   Caffeine - no daily use.   Social Determinants of Health   Financial Resource Strain: Low Risk    Difficulty of Paying Living Expenses: Not hard at all  Food Insecurity: No Food Insecurity   Worried About Ship broker in the Last Year: Never true   [place] in the Last Year: Never true  Transportation Needs: No Transportation Needs   Lack of Transportation (Medical): No   Lack of Transportation (Non-Medical): No  Physical Activity: Sufficiently Active   Days of Exercise per Week: 3 days   Minutes of Exercise per Session: 60 min  Stress: No Stress Concern Present   Feeling of Stress : Not at all  Social Connections: Moderately Integrated   Frequency of Communication with Friends and Family: More than three times a week   Frequency of Social Gatherings with Friends and Family: Once a week   Attends Religious Services: More than 4 times per year   Active Member of [employer] or Organizations: Yes   Attends Music therapist: More than 4 times per year   Marital Status: Never married    Tobacco Counseling Counseling given: Not Answered   Clinical Intake:                 Diabetic?***         Activities of Daily Living No flowsheet data found.  Patient Care Team: Billie Ruddy, MD as PCP - General (Family Medicine)  Indicate any recent Medical Services you may have received from other than [facility] providers in the past year (date may be approximate).     Assessment:   This is a routine wellness examination for [place].  Hearing/Vision screen No results found.  Dietary issues and exercise activities discussed:     Goals Addressed   None    Depression Screen PHQ 2/9 Scores 12/28/2019 09/09/2015 05/29/2014  PHQ - 2 Score 0 0 0  PHQ- 9 Score 0 - -    Fall Risk Fall Risk  12/28/2019 09/09/2015 05/29/2014  Falls in the past year? 0 Yes No  Number falls in past yr: 0 1 -  Injury with Fall? 0 Yes -  Risk for fall due to : Medication side effect Other (Comment) -  Risk for fall due to: Comment - Pt was lightheaded and tripped -  Follow up Falls evaluation completed;Falls prevention discussed - -    FALL RISK PREVENTION PERTAINING TO THE HOME:  Any  stairs in or around the home? {YES/NO:21197} If so, are there any without handrails? {YES/NO:21197} Home free of loose throw rugs in walkways, pet beds, electrical cords, etc? {YES/NO:21197}  Adequate lighting in your home to reduce risk of falls? {YES/NO:21197}  ASSISTIVE DEVICES UTILIZED TO PREVENT FALLS:  Life alert? {YES/NO:21197} Use of a cane, walker or w/c? {YES/NO:21197} Grab bars in the bathroom? {YES/NO:21197} Shower chair or bench in shower? {YES/NO:21197} Elevated toilet seat or a handicapped toilet? {YES/NO:21197}  TIMED UP AND GO:  Was the test performed? {YES/NO:21197}.  Length of time to ambulate 10 feet: *** sec.   {Appearance of [id number]  Cognitive Function:     6CIT Screen 12/28/2019  What Year? 0 points  What month? 0 points  What time? 0 points  Count back from 20 0 points  Months in reverse 0 points  Repeat phrase 2 points  Total Score 2    Immunizations Immunization History  Administered Date(s) Administered   Fluad Quad(high Dose 65+) 03/09/2019, 01/24/2020   Influenza Split 02/18/2011, 05/26/2013   Influenza, High Dose Seasonal PF 03/11/2016, 03/24/2017, 02/09/2018   Influenza,inj,Quad PF,6+ Mos 05/29/2014, 03/12/2015, 05/28/2015   Moderna Sars-Covid-2 Vaccination 06/02/2019, 07/07/2019   Pneumococcal Conjugate-13 05/31/2014   Pneumococcal Polysaccharide-23 06/22/2016   Td 01/09/2006   Tdap 03/01/2018   Zoster, Live 02/18/2011    {TDAP status:2101805}  {Flu Vaccine status:2101806}  {Pneumococcal vaccine status:2101807}  {Covid-19 vaccine status:2101808}  Qualifies for Shingles Vaccine? {YES/NO:21197}  Zostavax completed {YES/NO:21197}  {Shingrix Completed?:2101804}  Screening Tests Health Maintenance  Topic Date Due   Zoster Vaccines- Shingrix (1 of 2) Never done   COVID-19 Vaccine (3 - Moderna risk series) 08/04/2019   INFLUENZA VACCINE  12/09/2020   MAMMOGRAM  11/21/2021   COLONOSCOPY (Pts 45-76yr Insurance coverage  will need to be confirmed)  01/15/2025   TETANUS/TDAP  03/01/2028   DEXA SCAN  Completed   Hepatitis C Screening  Completed   PNA vac Low Risk Adult  Completed   HPV VACCINES  Aged Out    Health Maintenance  Health Maintenance Due  Topic Date Due   Zoster Vaccines- Shingrix (1 of 2) Never done   COVID-19 Vaccine (3 - Moderna risk series) 08/04/2019   INFLUENZA VACCINE  12/09/2020    {Colorectal cancer screening:2101809}  {Mammogram status:21018020}  {Bone Density status:21018021}  Lung Cancer Screening: (Low Dose CT Chest recommended if Age 15-80 years, 30 pack-year currently smoking OR have quit w/in 15years.) {DOES NOT does:27190::"does not"} qualify.   Lung Cancer Screening Referral: ***  Additional Screening:  Hepatitis C Screening: {DOES NOT does:27190::"does not"} qualify; Completed ***  Vision Screening: Recommended annual ophthalmology exams for early detection of glaucoma and other disorders of the eye. Is the patient up to date with their annual eye exam?  {YES/NO:21197} Who is the provider or what is the name of the office in which the patient attends annual eye exams? *** If pt is not established with a provider, would they like to be referred to a provider to establish care? {YES/NO:21197}.   Dental Screening: Recommended annual dental exams for proper oral hygiene  Community Resource Referral / Chronic Care Management: CRR required this visit?  {YES/NO:21197}  CCM required this visit?  {YES/NO:21197}     Plan:     I have personally reviewed and noted the following in the patient's chart:   Medical and social history Use of alcohol, tobacco or illicit drugs  Current medications and supplements including opioid prescriptions.  Functional ability and status Nutritional status Physical activity Advanced directives List of other physicians Hospitalizations, surgeries, and ER visits in previous 12 months Vitals Screenings to include cognitive,  depression, and falls Referrals and appointments  In addition, I have reviewed and discussed with patient certain preventive protocols, quality metrics, and best practice recommendations. A written personalized care plan for preventive services as well as general preventive health recommendations were provided to patient.     Randel Pigg, LPN   [phone]   Nurse Notes: ***

## 2020-12-31 NOTE — Telephone Encounter (Signed)
Called patient three times with no answer.no voice mail. Pt may rescheduled next available appointment.   L.Seanne Chirico,LPN

## 2021-01-10 ENCOUNTER — Ambulatory Visit (INDEPENDENT_AMBULATORY_CARE_PROVIDER_SITE_OTHER): Payer: Medicare Other

## 2021-01-10 DIAGNOSIS — Z0000 Encounter for general adult medical examination without abnormal findings: Secondary | ICD-10-CM | POA: Diagnosis not present

## 2021-01-10 DIAGNOSIS — Z780 Asymptomatic menopausal state: Secondary | ICD-10-CM

## 2021-01-10 NOTE — Progress Notes (Signed)
Subjective:   Katherine [name] is a 70 y.o. female who presents for Medicare Annual (Subsequent) preventive examination.   I connected with Drue Stager today by telephone and verified that I am speaking with the correct person using two identifiers. Location patient: home Location provider: work Persons participating in the virtual visit: patient, provider.   I discussed the limitations, risks, security and privacy concerns of performing an evaluation and management service by telephone and the availability of in person appointments. I also discussed with the patient that there may be a patient responsible charge related to this service. The patient expressed understanding and verbally consented to this telephonic visit.    Interactive audio and video telecommunications were attempted between this provider and patient, however failed, due to patient having technical difficulties OR patient did not have access to video capability.  We continued and completed visit with audio only.    Review of Systems    N/a       Objective:    There were no vitals filed for this visit. There is no height or weight on file to calculate BMI.  Advanced Directives 12/28/2019 11/28/2018 05/02/2018 04/26/2018 07/28/2017 07/23/2017 07/02/2017  Does Patient Have a Medical Advance Directive? Yes No Yes Yes No No No  Type of Paramedic of [place];Living will - [place];Living will [place];Living will - - -  Does patient want to make changes to medical advance directive? No - Patient declined - No - Patient declined No - Patient declined - - -  Copy of [place] in Chart? Yes - validated most recent copy scanned in chart (See row information) - Yes - validated most recent copy scanned in chart (See row information) Yes - validated most recent copy scanned in chart (See row information) - - -  Would patient like information on  creating a medical advance directive? - No - Patient declined - - No - Patient declined - No - Patient declined    Current Medications (verified) Outpatient Encounter Medications as of 01/10/2021  Medication Sig   Cholecalciferol (VITAMIN D) 50 MCG (2000 UT) tablet Take 2,000 Units by mouth daily.    cycloSPORINE (RESTASIS) 0.05 % ophthalmic emulsion Place 1 drop into both eyes 2 (two) times daily.   hydrochlorothiazide (HYDRODIURIL) 12.5 MG tablet Take 12.5 mg by mouth daily.   L-Methylfolate-Algae-B12-B6 (METANX) 3-90.314-2-35 MG CAPS TAKE 1 CAPSULE BY MOUTH TWO TIMES A DAY   lidocaine (XYLOCAINE) 5 % ointment Apply 1 application topically 3 (three) times daily as needed.   lidocaine-prilocaine (EMLA) cream Apply 1 application topically to affected areas 3 times daily as needed.   lisinopril (ZESTRIL) 10 MG tablet TAKE 1 TABLET BY MOUTH  DAILY   Multiple Vitamin (MULTIVITAMIN) tablet Take 1 tablet by mouth daily.   nortriptyline (PAMELOR) 25 MG capsule TAKE 3 CAPSULES BY MOUTH AT BEDTIME   pregabalin (LYRICA) 150 MG capsule Take 1 capsule (150 mg total) by mouth 2 (two) times daily.   traMADol (ULTRAM) 50 MG tablet Take one to two tablets by mouth every 6 hours if needed for pain, Prescribed by Neurology   No facility-administered encounter medications on file as of 01/10/2021.    Allergies (verified) Erythromycin   History: Past Medical History:  Diagnosis Date   Complication of anesthesia     neuropathy in feet flares after surgery from PAS hose. needs cream on feet   DDD (degenerative disc disease), lumbar 2012   Dental  crowns present    GERD (gastroesophageal reflux disease)    occ, no medications at this time   History of anemia 1991   not since hysterectomy   History of gross hematuria 2002   with kidney stones   History of hyperparathyroidism    History of kidney stones    Hypertension    states under control with meds., has been on med. x 20 yr.   Impingement syndrome  of right shoulder 2017   Lymphedema of both lower extremities    due to neuropathy   Lymphedema of both upper extremities    due to neuropathy   Obese    Osteoarthritis    Peripheral neuropathy    hands and feet   Sciatica    Triangular fibrocartilage complex tear 06/2017   right   Ulnar impaction syndrome, right 06/2017   Wears partial dentures    upper and lower   Past Surgical History:  Procedure Laterality Date   ABDOMINAL HYSTERECTOMY  1991   partial   CARPAL TUNNEL RELEASE Right 08/09/2002   CARPAL TUNNEL RELEASE Left 11/03/2006   COLONOSCOPY  01/2016   COLONOSCOPY WITH PROPOFOL  11/24/2011   CYSTOSCOPY WITH RETROGRADE PYELOGRAM, URETEROSCOPY AND STENT PLACEMENT Left 12/18/2014   Procedure: CYSTOSCOPY WITH RETROGRADE PYELOGRAM, URETEROSCOPY AND STENT PLACEMENT left ureter;  Surgeon: Raynelle Bring, MD;  Location: [facility];  Service: Urology;  Laterality: Left;   DILATION AND CURETTAGE OF UTERUS     ELBOW ARTHROSCOPY Right    EXTRACORPOREAL SHOCK WAVE LITHOTRIPSY Left 03/08/2017   Procedure: LEFT EXTRACORPOREAL SHOCK WAVE LITHOTRIPSY (ESWL);  Surgeon: Raynelle Bring, MD;  Location: [facility];  Service: Urology;  Laterality: Left;   HOLMIUM LASER APPLICATION Left [id number]   Procedure: HOLMIUM LASER APPLICATION left ureter ;  Surgeon: Raynelle Bring, MD;  Location: [facility];  Service: Urology;  Laterality: Left;   PARATHYROIDECTOMY Right 05/03/2015   Procedure: RIGHT SUPERIOR PARATHYROIDECTOMY;  Surgeon: Armandina Gemma, MD;  Location: [facility];  Service: General;  Laterality: Right;   PATELLA REALIGNMENT Bilateral    as a teenager   ROTATOR CUFF REPAIR Right    x2   SHOULDER ARTHROSCOPY Right    x 2   SHOULDER ARTHROSCOPY W/ ROTATOR CUFF REPAIR Left 04/26/2002   SYNOVECTOMY WITH POLY EXCHANGE Left 11/28/2018   Procedure: SYNOVECTOMY WITH Posey Pronto;  Surgeon: Vickey Huger, MD;  Location: [facility];  Service: Orthopedics;  Laterality: Left;   TONSILLECTOMY     TOTAL KNEE ARTHROPLASTY Left  09/10/2003   TOTAL KNEE ARTHROPLASTY Right 11/10/2004   TOTAL KNEE ARTHROPLASTY Right 05/02/2018   Procedure: Right polyethelene exchange and open synovectomy;  Surgeon: Vickey Huger, MD;  Location: [facility];  Service: Orthopedics;  Laterality: Right;   TRIGGER FINGER RELEASE Right 08/06/2008   ring finger   TRIGGER FINGER RELEASE     multiple - right thumb, left index/long/ring/thumb   ULNA OSTEOTOMY Right 07/28/2017   Procedure: REVISION RIGHT ULNAR SHORTENING OSTEOTOMY;  Surgeon: Leandrew Koyanagi, MD;  Location: [facility];  Service: Orthopedics;  Laterality: Right;   WRIST ARTHROSCOPY Left 06/16/2000   WRIST ARTHROSCOPY WITH ULNA SHORTENING Right 07/07/2017   Procedure: RIGHT WRIST ARTHROSCOPY WITH ULNAR SHORTENING OSTEOTOMY;  Surgeon: Leandrew Koyanagi, MD;  Location: [facility];  Service: Orthopedics;  Laterality: Right;   Family History  Problem Relation Age of Onset   Stroke Mother    Cancer Mother 16       bone cancer   Other Father  unknown medical history   Diabetes Sister    Social History   Socioeconomic History   Marital status: Single    Spouse name: Not on file   Number of children: 1   Years of education: some college   Highest education level: Not on file  Occupational History   Occupation: Retired in 2011    Employer: [employer]  Tobacco Use   Smoking status: Never   Smokeless tobacco: Never  Vaping Use   Vaping Use: Never used  Substance and Sexual Activity   Alcohol use: Not Currently   Drug use: Never   Sexual activity: Never    Birth control/protection: Surgical  Other Topics Concern   Not on file  Social History Narrative   Patient is single and lives with her brother.    Patient is right handed.   Caffeine - no daily use.   Social Determinants of Health   Financial Resource Strain: Not on file  Food Insecurity: Not on file  Transportation Needs: Not on file  Physical Activity: Not on file  Stress: Not on file   Social Connections: Not on file    Tobacco Counseling Counseling given: Not Answered   Clinical Intake:                 Diabetic?no         Activities of Daily Living No flowsheet data found.  Patient Care Team: Billie Ruddy, MD as PCP - General (Family Medicine)  Indicate any recent Medical Services you may have received from other than [facility] providers in the past year (date may be approximate).     Assessment:   This is a routine wellness examination for [place].  Hearing/Vision screen No results found.  Dietary issues and exercise activities discussed:     Goals Addressed   None    Depression Screen PHQ 2/9 Scores 12/28/2019 09/09/2015 05/29/2014  PHQ - 2 Score 0 0 0  PHQ- 9 Score 0 - -    Fall Risk Fall Risk  12/28/2019 09/09/2015 05/29/2014  Falls in the past year? 0 Yes No  Number falls in past yr: 0 1 -  Injury with Fall? 0 Yes -  Risk for fall due to : Medication side effect Other (Comment) -  Risk for fall due to: Comment - Pt was lightheaded and tripped -  Follow up Falls evaluation completed;Falls prevention discussed - -    FALL RISK PREVENTION PERTAINING TO THE HOME:  Any stairs in or around the home? No  If so, are there any without handrails? Yes  Home free of loose throw rugs in walkways, pet beds, electrical cords, etc? Yes  Adequate lighting in your home to reduce risk of falls? Yes   ASSISTIVE DEVICES UTILIZED TO PREVENT FALLS:  Life alert? No  Use of a cane, walker or w/c? No  Grab bars in the bathroom? Yes  Shower chair or bench in shower? Yes  Elevated toilet seat or a handicapped toilet? Yes    Cognitive Function:  Normal cognitive status assessed by direct observation by this Nurse Health Advisor. No abnormalities found.     6CIT Screen 12/28/2019  What Year? 0 points  What month? 0 points  What time? 0 points  Count back from 20 0 points  Months in reverse 0 points  Repeat phrase 2 points  Total Score 2     Immunizations Immunization History  Administered Date(s) Administered   Fluad Quad(high Dose 65+) 03/09/2019, 01/24/2020  Influenza Split 02/18/2011, 05/26/2013   Influenza, High Dose Seasonal PF 03/11/2016, 03/24/2017, 02/09/2018   Influenza,inj,Quad PF,6+ Mos 05/29/2014, 03/12/2015, 05/28/2015   Moderna Sars-Covid-2 Vaccination 06/02/2019, 07/07/2019   Pneumococcal Conjugate-13 05/31/2014   Pneumococcal Polysaccharide-23 06/22/2016   Td 01/09/2006   Tdap 03/01/2018   Zoster, Live 02/18/2011    TDAP status: Due, Education has been provided regarding the importance of this vaccine. Advised may receive this vaccine at local pharmacy or Health Dept. Aware to provide a copy of the vaccination record if obtained from local pharmacy or Health Dept. Verbalized acceptance and understanding.  Flu Vaccine status: Up to date  Pneumococcal vaccine status: Up to date  Covid-19 vaccine status: Completed vaccines  Qualifies for Shingles Vaccine? Yes   Zostavax completed No   Shingrix Completed?: No.    Education has been provided regarding the importance of this vaccine. Patient has been advised to call insurance company to determine out of pocket expense if they have not yet received this vaccine. Advised may also receive vaccine at local pharmacy or Health Dept. Verbalized acceptance and understanding.  Screening Tests Health Maintenance  Topic Date Due   Zoster Vaccines- Shingrix (1 of 2) Never done   COVID-19 Vaccine (3 - Moderna risk series) 08/04/2019   INFLUENZA VACCINE  12/09/2020   MAMMOGRAM  11/21/2021   COLONOSCOPY (Pts 45-42yr Insurance coverage will need to be confirmed)  01/15/2025   TETANUS/TDAP  03/01/2028   DEXA SCAN  Completed   Hepatitis C Screening  Completed   PNA vac Low Risk Adult  Completed   HPV VACCINES  Aged Out    Health Maintenance  Health Maintenance Due  Topic Date Due   Zoster Vaccines- Shingrix (1 of 2) Never done   COVID-19 Vaccine (3 -  Moderna risk series) 08/04/2019   INFLUENZA VACCINE  12/09/2020    Colorectal cancer screening: Type of screening: Colonoscopy. Completed 01/16/2015. Repeat every 10 years  Mammogram status: Completed 11/21/2020. Repeat every year  Bone Density status: Ordered 01/10/2021. Pt provided with contact info and advised to call to schedule appt.  Lung Cancer Screening: (Low Dose CT Chest recommended if Age 72-80years, 30 pack-year currently smoking OR have quit w/in 15years.) does not qualify.   Lung Cancer Screening Referral: n/a  Additional Screening:  Hepatitis C Screening: does not qualify; Completed 02/09/2019  Vision Screening: Recommended annual ophthalmology exams for early detection of glaucoma and other disorders of the eye. Is the patient up to date with their annual eye exam?  Yes  Who is the provider or what is the name of the office in which the patient attends annual eye exams? Dr.Whitaker  If pt is not established with a provider, would they like to be referred to a provider to establish care? No .   Dental Screening: Recommended annual dental exams for proper oral hygiene  Community Resource Referral / Chronic Care Management: CRR required this visit?  No   CCM required this visit?  No      Plan:     I have personally reviewed and noted the following in the patient's chart:   Medical and social history Use of alcohol, tobacco or illicit drugs  Current medications and supplements including opioid prescriptions.  Functional ability and status Nutritional status Physical activity Advanced directives List of other physicians Hospitalizations, surgeries, and ER visits in previous 12 months Vitals Screenings to include cognitive, depression, and falls Referrals and appointments  In addition, I have reviewed and discussed with patient certain preventive  protocols, quality metrics, and best practice recommendations. A written personalized care plan for preventive  services as well as general preventive health recommendations were provided to patient.     Randel Pigg, LPN   [id number]   Nurse Notes: none

## 2021-01-10 NOTE — Patient Instructions (Signed)
Katherine [name] , Thank you for taking time to come for your Medicare Wellness Visit. I appreciate your ongoing commitment to your health goals. Please review the following plan we discussed and let me know if I can assist you in the future.   Screening recommendations/referrals: Colonoscopy: 01/16/2015  due 2026 Mammogram: 11/21/2020 Bone Density: referral 01/10/2021 Recommended yearly ophthalmology/optometry visit for glaucoma screening and checkup Recommended yearly dental visit for hygiene and checkup  Vaccinations: Influenza vaccine: due in fall 2022  Pneumococcal vaccine: completed series  Tdap vaccine: 03/01/2018 Shingles vaccine: will consider     Advanced directives: copies in chart   Conditions/risks identified: none   Next appointment: CPE 01/30/2021  0900  Dr. Volanda Napoleon    Preventive Care 72 Years and Older, Female Preventive care refers to lifestyle choices and visits with your health care provider that can promote health and wellness. What does preventive care include? A yearly physical exam. This is also called an annual well check. Dental exams once or twice a year. Routine eye exams. Ask your health care provider how often you should have your eyes checked. Personal lifestyle choices, including: Daily care of your teeth and gums. Regular physical activity. Eating a healthy diet. Avoiding tobacco and drug use. Limiting alcohol use. Practicing safe sex. Taking low-dose aspirin every day. Taking vitamin and mineral supplements as recommended by your health care provider. What happens during an annual well check? The services and screenings done by your health care provider during your annual well check will depend on your age, overall health, lifestyle risk factors, and family history of disease. Counseling  Your health care provider may ask you questions about your: Alcohol use. Tobacco use. Drug use. Emotional well-being. Home and relationship  well-being. Sexual activity. Eating habits. History of falls. Memory and ability to understand (cognition). Work and work Statistician. Reproductive health. Screening  You may have the following tests or measurements: Height, weight, and BMI. Blood pressure. Lipid and cholesterol levels. These may be checked every 5 years, or more frequently if you are over 12 years old. Skin check. Lung cancer screening. You may have this screening every year starting at age 16 if you have a 30-pack-year history of smoking and currently smoke or have quit within the past 15 years. Fecal occult blood test (FOBT) of the stool. You may have this test every year starting at age 35. Flexible sigmoidoscopy or colonoscopy. You may have a sigmoidoscopy every 5 years or a colonoscopy every 10 years starting at age 39. Hepatitis C blood test. Hepatitis B blood test. Sexually transmitted disease (STD) testing. Diabetes screening. This is done by checking your blood sugar (glucose) after you have not eaten for a while (fasting). You may have this done every 1-3 years. Bone density scan. This is done to screen for osteoporosis. You may have this done starting at age 41. Mammogram. This may be done every 1-2 years. Talk to your health care provider about how often you should have regular mammograms. Talk with your health care provider about your test results, treatment options, and if necessary, the need for more tests. Vaccines  Your health care provider may recommend certain vaccines, such as: Influenza vaccine. This is recommended every year. Tetanus, diphtheria, and acellular pertussis (Tdap, Td) vaccine. You may need a Td booster every 10 years. Zoster vaccine. You may need this after age 57. Pneumococcal 13-valent conjugate (PCV13) vaccine. One dose is recommended after age 58. Pneumococcal polysaccharide (PPSV23) vaccine. One dose is recommended after age 67.  Talk to your health care provider about which  screenings and vaccines you need and how often you need them. This information is not intended to replace advice given to you by your health care provider. Make sure you discuss any questions you have with your health care provider. Document Released: 05/24/2015 Document Revised: 01/15/2016 Document Reviewed: 02/26/2015 Elsevier Interactive Patient Education  2017 [place] Prevention in the Home Falls can cause injuries. They can happen to people of all ages. There are many things you can do to make your home safe and to help prevent falls. What can I do on the outside of my home? Regularly fix the edges of walkways and driveways and fix any cracks. Remove anything that might make you trip as you walk through a door, such as a raised step or threshold. Trim any bushes or trees on the path to your home. Use bright outdoor lighting. Clear any walking paths of anything that might make someone trip, such as rocks or tools. Regularly check to see if handrails are loose or broken. Make sure that both sides of any steps have handrails. Any raised decks and porches should have guardrails on the edges. Have any leaves, snow, or ice cleared regularly. Use sand or salt on walking paths during winter. Clean up any spills in your garage right away. This includes oil or grease spills. What can I do in the bathroom? Use night lights. Install grab bars by the toilet and in the tub and shower. Do not use towel bars as grab bars. Use non-skid mats or decals in the tub or shower. If you need to sit down in the shower, use a plastic, non-slip stool. Keep the floor dry. Clean up any water that spills on the floor as soon as it happens. Remove soap buildup in the tub or shower regularly. Attach bath mats securely with double-sided non-slip rug tape. Do not have throw rugs and other things on the floor that can make you trip. What can I do in the bedroom? Use night lights. Make sure that you have a  light by your bed that is easy to reach. Do not use any sheets or blankets that are too big for your bed. They should not hang down onto the floor. Have a firm chair that has side arms. You can use this for support while you get dressed. Do not have throw rugs and other things on the floor that can make you trip. What can I do in the kitchen? Clean up any spills right away. Avoid walking on wet floors. Keep items that you use a lot in easy-to-reach places. If you need to reach something above you, use a strong step stool that has a grab bar. Keep electrical cords out of the way. Do not use floor polish or wax that makes floors slippery. If you must use wax, use non-skid floor wax. Do not have throw rugs and other things on the floor that can make you trip. What can I do with my stairs? Do not leave any items on the stairs. Make sure that there are handrails on both sides of the stairs and use them. Fix handrails that are broken or loose. Make sure that handrails are as long as the stairways. Check any carpeting to make sure that it is firmly attached to the stairs. Fix any carpet that is loose or worn. Avoid having throw rugs at the top or bottom of the stairs. If you do have throw  rugs, attach them to the floor with carpet tape. Make sure that you have a light switch at the top of the stairs and the bottom of the stairs. If you do not have them, ask someone to add them for you. What else can I do to help prevent falls? Wear shoes that: Do not have high heels. Have rubber bottoms. Are comfortable and fit you well. Are closed at the toe. Do not wear sandals. If you use a stepladder: Make sure that it is fully opened. Do not climb a closed stepladder. Make sure that both sides of the stepladder are locked into place. Ask someone to hold it for you, if possible. Clearly mark and make sure that you can see: Any grab bars or handrails. First and last steps. Where the edge of each step  is. Use tools that help you move around (mobility aids) if they are needed. These include: Canes. Walkers. Scooters. Crutches. Turn on the lights when you go into a dark area. Replace any light bulbs as soon as they burn out. Set up your furniture so you have a clear path. Avoid moving your furniture around. If any of your floors are uneven, fix them. If there are any pets around you, be aware of where they are. Review your medicines with your doctor. Some medicines can make you feel dizzy. This can increase your chance of falling. Ask your doctor what other things that you can do to help prevent falls. This information is not intended to replace advice given to you by your health care provider. Make sure you discuss any questions you have with your health care provider. Document Released: 02/21/2009 Document Revised: 10/03/2015 Document Reviewed: 06/01/2014 Elsevier Interactive Patient Education  2017 [employer].

## 2021-01-24 ENCOUNTER — Other Ambulatory Visit: Payer: Self-pay | Admitting: Family Medicine

## 2021-01-24 DIAGNOSIS — G609 Hereditary and idiopathic neuropathy, unspecified: Secondary | ICD-10-CM

## 2021-01-27 ENCOUNTER — Other Ambulatory Visit: Payer: Self-pay | Admitting: Family Medicine

## 2021-01-27 DIAGNOSIS — E2839 Other primary ovarian failure: Secondary | ICD-10-CM

## 2021-01-29 ENCOUNTER — Other Ambulatory Visit: Payer: Self-pay

## 2021-01-29 MED ORDER — METANX 3-90.314-2-35 MG PO CAPS: ORAL_CAPSULE | ORAL | 1 refills | Status: DC

## 2021-01-30 ENCOUNTER — Encounter: Payer: Self-pay | Admitting: Family Medicine

## 2021-01-30 ENCOUNTER — Other Ambulatory Visit: Payer: Medicare Other

## 2021-01-30 ENCOUNTER — Ambulatory Visit (INDEPENDENT_AMBULATORY_CARE_PROVIDER_SITE_OTHER): Payer: Medicare Other | Admitting: Family Medicine

## 2021-01-30 ENCOUNTER — Ambulatory Visit
Admission: RE | Admit: 2021-01-30 | Discharge: 2021-01-30 | Disposition: A | Discharge status: Home / Self Care | Payer: Medicare Other | Source: Ambulatory Visit | Attending: Family Medicine | Admitting: Family Medicine

## 2021-01-30 VITALS — BP 108/78 | HR 104 | Temp 98.0°F | Ht 65.5 in | Wt 254.2 lb

## 2021-01-30 DIAGNOSIS — Z0000 Encounter for general adult medical examination without abnormal findings: Secondary | ICD-10-CM

## 2021-01-30 DIAGNOSIS — Z23 Encounter for immunization: Secondary | ICD-10-CM

## 2021-01-30 DIAGNOSIS — E559 Vitamin D deficiency, unspecified: Secondary | ICD-10-CM | POA: Diagnosis not present

## 2021-01-30 DIAGNOSIS — M25562 Pain in left knee: Secondary | ICD-10-CM | POA: Diagnosis not present

## 2021-01-30 DIAGNOSIS — G609 Hereditary and idiopathic neuropathy, unspecified: Secondary | ICD-10-CM

## 2021-01-30 DIAGNOSIS — G8929 Other chronic pain: Secondary | ICD-10-CM

## 2021-01-30 DIAGNOSIS — R7303 Prediabetes: Secondary | ICD-10-CM | POA: Diagnosis not present

## 2021-01-30 DIAGNOSIS — I10 Essential (primary) hypertension: Secondary | ICD-10-CM

## 2021-01-30 DIAGNOSIS — E7841 Elevated Lipoprotein(a): Secondary | ICD-10-CM | POA: Diagnosis not present

## 2021-01-30 DIAGNOSIS — R0789 Other chest pain: Secondary | ICD-10-CM

## 2021-01-30 DIAGNOSIS — M25561 Pain in right knee: Secondary | ICD-10-CM | POA: Diagnosis not present

## 2021-01-30 DIAGNOSIS — E2839 Other primary ovarian failure: Secondary | ICD-10-CM

## 2021-01-30 DIAGNOSIS — M48061 Spinal stenosis, lumbar region without neurogenic claudication: Secondary | ICD-10-CM

## 2021-01-30 LAB — T4, FREE: Free T4: 0.67 ng/dL (ref 0.60–1.60)

## 2021-01-30 LAB — LIPID PANEL
Cholesterol: 212 mg/dL — ABNORMAL HIGH (ref 0–200)
HDL: 48.8 mg/dL (ref >39.00)
LDL Cholesterol: 126 mg/dL — ABNORMAL HIGH (ref 0–99)
NonHDL: 162.89
Total CHOL/HDL Ratio: 4
Triglycerides: 182 mg/dL — ABNORMAL HIGH (ref 0.0–149.0)
VLDL: 36.4 mg/dL (ref 0.0–40.0)

## 2021-01-30 LAB — CBC WITH DIFFERENTIAL/PLATELET
Basophils Absolute: 0 10*3/uL (ref 0.0–0.1)
Basophils Relative: 0.4 % (ref 0.0–3.0)
Eosinophils Absolute: 0.2 10*3/uL (ref 0.0–0.7)
Eosinophils Relative: 4.4 % (ref 0.0–5.0)
HCT: 40.5 % (ref 36.0–46.0)
Hemoglobin: 13.6 g/dL (ref 12.0–15.0)
Lymphocytes Relative: 44.4 % (ref 12.0–46.0)
Lymphs Abs: 1.9 10*3/uL (ref 0.7–4.0)
MCHC: 33.6 g/dL (ref 30.0–36.0)
MCV: 87.6 fl (ref 78.0–100.0)
Monocytes Absolute: 0.6 10*3/uL (ref 0.1–1.0)
Monocytes Relative: 14.3 % — ABNORMAL HIGH (ref 3.0–12.0)
Neutro Abs: 1.5 10*3/uL (ref 1.4–7.7)
Neutrophils Relative %: 36.5 % — ABNORMAL LOW (ref 43.0–77.0)
Platelets: 145 10*3/uL — ABNORMAL LOW (ref 150.0–400.0)
RBC: 4.62 Mil/uL (ref 3.87–5.11)
RDW: 14.9 % (ref 11.5–15.5)
WBC: 4.2 10*3/uL (ref 4.0–10.5)

## 2021-01-30 LAB — HEMOGLOBIN A1C: Hgb A1c MFr Bld: 6.6 % — ABNORMAL HIGH (ref 4.6–6.5)

## 2021-01-30 LAB — COMPREHENSIVE METABOLIC PANEL
ALT: 26 U/L (ref 0–35)
AST: 51 U/L — ABNORMAL HIGH (ref 0–37)
Albumin: 4.2 g/dL (ref 3.5–5.2)
Alkaline Phosphatase: 63 U/L (ref 39–117)
BUN: 12 mg/dL (ref 6–23)
CO2: 30 mEq/L (ref 19–32)
Calcium: 9.9 mg/dL (ref 8.4–10.5)
Chloride: 100 mEq/L (ref 96–112)
Creatinine, Ser: 0.91 mg/dL (ref 0.40–1.20)
GFR: 63.34 mL/min (ref >60.00)
Glucose, Bld: 101 mg/dL — ABNORMAL HIGH (ref 70–99)
Potassium: 3.8 mEq/L (ref 3.5–5.1)
Sodium: 138 mEq/L (ref 135–145)
Total Bilirubin: 0.5 mg/dL (ref 0.2–1.2)
Total Protein: 7.6 g/dL (ref 6.0–8.3)

## 2021-01-30 LAB — VITAMIN D 25 HYDROXY (VIT D DEFICIENCY, FRACTURES): VITD: 37.37 ng/mL (ref 30.00–100.00)

## 2021-01-30 LAB — TSH: TSH: 1.23 u[IU]/mL (ref 0.35–5.50)

## 2021-01-30 NOTE — Progress Notes (Signed)
Subjective:     Katherine [name] is a 70 y.o. female and is here for a comprehensive physical exam.   Patient has bone density scheduled for later today.  Had a mammogram 11/21/2020.  Patient notes continued idiopathic neuropathy in extremities.  Previously seen by Dr. Lorre Nick, Ortho.  Notes back pain, bilateral knee pain status post TKR.  Patient with mild to moderate spinal stenosis and lumbar spine as noted on MRI 02/20/2019.  Patient typically swims in the morning but has been unable to recently due to back pain making it difficult to walk from the car into the [facility].  Also notes intermittent chest pressure in right and left upper chest.  Sensation may last for 5-10 minutes.  Social History   Socioeconomic History   Marital status: Single    Spouse name: Not on file   Number of children: 1   Years of education: some college   Highest education level: Not on file  Occupational History   Occupation: Retired in 2011    Employer: [employer]  Tobacco Use   Smoking status: Never   Smokeless tobacco: Never  Vaping Use   Vaping Use: Never used  Substance and Sexual Activity   Alcohol use: Not Currently   Drug use: Never   Sexual activity: Never    Birth control/protection: Surgical  Other Topics Concern   Not on file  Social History Narrative   Patient is single and lives with her brother.    Patient is right handed.   Caffeine - no daily use.   Social Determinants of Health   Financial Resource Strain: Low Risk    Difficulty of Paying Living Expenses: Not hard at all  Food Insecurity: No Food Insecurity   Worried About Charity fundraiser in the Last Year: Never true   Worth in the Last Year: Never true  Transportation Needs: No Transportation Needs   Lack of Transportation (Medical): No   Lack of Transportation (Non-Medical): No  Physical Activity: Sufficiently Active   Days of Exercise per Week: 3 days   Minutes of Exercise per Session: 60 min  Stress: No Stress  Concern Present   Feeling of Stress : Not at all  Social Connections: Moderately Integrated   Frequency of Communication with Friends and Family: More than three times a week   Frequency of Social Gatherings with Friends and Family: More than three times a week   Attends Religious Services: More than 4 times per year   Active Member of [employer] or Organizations: Yes   Attends Music therapist: More than 4 times per year   Marital Status: Never married  Human resources officer Violence: Not At Risk   Fear of Current or Ex-Partner: No   Emotionally Abused: No   Physically Abused: No   Sexually Abused: No   Health Maintenance  Topic Date Due   Zoster Vaccines- Shingrix (1 of 2) Never done   COVID-19 Vaccine (3 - Moderna risk series) 08/04/2019   INFLUENZA VACCINE  12/09/2020   MAMMOGRAM  11/21/2021   COLONOSCOPY (Pts 45-80yrs Insurance coverage will need to be confirmed)  01/15/2025   TETANUS/TDAP  03/01/2028   DEXA SCAN  Completed   Hepatitis C Screening  Completed   HPV VACCINES  Aged Out    The following portions of the patient's history were reviewed and updated as appropriate: allergies, current medications, past family history, past medical history, past social history, past surgical history, and problem list.  Review  of Systems Pertinent items noted in HPI and remainder of comprehensive ROS otherwise negative.   Objective:    BP 108/78 (BP Location: Right Arm, Patient Position: Sitting, Cuff Size: Large)   Pulse (!) 104   Temp 98 F (36.7 C) (Oral)   Ht 5' 5.5" (1.664 m)   Wt 254 lb 3.2 oz (115.3 kg)   SpO2 97%   BMI 41.66 kg/m  General appearance: alert, cooperative, and no distress Head: Normocephalic, without obvious abnormality, atraumatic Eyes: conjunctivae/corneas clear. PERRL, EOM's intact. Fundi benign. Ears: normal TM's and external ear canals both ears Nose: Nares normal. Septum midline. Mucosa normal. No drainage or sinus tenderness. Throat: lips,  mucosa, and tongue normal; teeth and gums normal Neck: no adenopathy, no carotid bruit, no JVD, supple, symmetrical, trachea midline, and thyroid not enlarged, symmetric, no tenderness/mass/nodules Lungs: clear to auscultation bilaterally Heart: regular rate and rhythm, S1, S2 normal, no murmur, click, rub or gallop Abdomen: soft, non-tender; bowel sounds normal; no masses,  no organomegaly Extremities: extremities normal, atraumatic, no cyanosis or edema Pulses: 2+ and symmetric Skin: Skin color, texture, turgor normal. No rashes or lesions Lymph nodes: Cervical, supraclavicular, and axillary nodes normal. Neurologic: Alert and oriented X 3, normal strength and tone. Normal symmetric reflexes. Normal coordination and gait    Assessment:    Healthy female exam.      Plan:    Anticipatory guidance given including wearing seatbelts, smoke detectors in the home, increasing physical activity, increasing p.o. intake of water and vegetables. -labs -Colonoscopy done 01/16/2015 -Mammogram done 11/21/2020 -Bone density scheduled for today -Given handout -Neck CPE in 1 year See After Visit Summary for Counseling Recommendations   Hereditary and idiopathic peripheral neuropathy  -Continue current medications including lidocaine cream, Lyrica, nortriptyline -Continue follow-up with neurology and oncology - Plan: CBC with Differential/Platelet  Chest pressure  -Intermittent sensation currently present. -EKG with sinus tach.  Interference noted.  No T wave inversion or ST elevation.  Previous EKGs reviewed for comparison -Discussed stress test.  Patient wishes to wait at this time. - Plan: CBC with Differential/Platelet, EKG 12-Lead  Prediabetes  -Hemoglobin A1c 5.7% on 11/03/2019 -Continue lifestyle modifications - Plan: Hemoglobin A1c  Spinal stenosis of lumbar region, unspecified whether neurogenic claudication present -Noted on MRI from several years ago -Discussed follow-up with Ortho  and/or neurosurgery -Continue water aerobics and other supportive care  Essential hypertension -Controlled -Continue current meds including lisinopril 10 mg, HCTZ 12.5 mg -Continue lifestyle modifications  Chronic pain of both knees -Status post TKR -Continue follow-up with Ortho - Plan: CBC with Differential/Platelet  Elevated lipoprotein(a)  - Plan: Lipid panel  Need for influenza vaccination  - Plan: Flu Vaccine QUAD High Dose(Fluad)  Vitamin D deficiency -Continue vitamin D supplementation - Plan: Vitamin D, 25-hydroxy  Follow-up in 3-4 months, sooner if needed  Grier Mitts, MD

## 2021-02-05 ENCOUNTER — Other Ambulatory Visit: Payer: Self-pay

## 2021-02-05 ENCOUNTER — Encounter: Payer: Self-pay | Admitting: Orthopaedic Surgery

## 2021-02-05 ENCOUNTER — Ambulatory Visit: Payer: Self-pay

## 2021-02-05 ENCOUNTER — Ambulatory Visit (INDEPENDENT_AMBULATORY_CARE_PROVIDER_SITE_OTHER): Payer: Medicare Other | Admitting: Orthopaedic Surgery

## 2021-02-05 DIAGNOSIS — M48062 Spinal stenosis, lumbar region with neurogenic claudication: Secondary | ICD-10-CM

## 2021-02-05 NOTE — Progress Notes (Signed)
Office Visit Note   Patient: Katherine [name]           Date of Birth: 01/27/1951           MRN: [id number] Visit Date: 02/05/2021              Requested by: Billie Ruddy, MD [place],  [place] [id number] PCP: Billie Ruddy, MD   Assessment & Plan: Visit Diagnoses:  1. Spinal stenosis, lumbar region with neurogenic claudication     Plan: Impression is lumbar spinal stenosis.  She had an MRI back in 2020 which showed mild to moderate spinal stenosis.  I feel that her symptoms have gotten much worse at this time and she would benefit from a consultation with a spine surgeon.  She is requested a referral to Dr.Stern.  Referral has been made.  We will see her back as needed.  Follow-Up Instructions: No follow-ups on file.   Orders:  Orders Placed This Encounter  Procedures   XR Lumbar Spine 2-3 Views   No orders of the defined types were placed in this encounter.     Procedures: No procedures performed   Clinical Data: No additional findings.   Subjective: Chief Complaint  Patient presents with   Lower Back - Follow-up    HPI  Ms. Preslar is a very pleasant 70 year old female here for evaluation of chronic low back pain and bilateral leg pain.  This is gotten gradually worse over the last 3 years.  Denies any injuries.  She has neuropathy.  She endorses 10 out of 10 pain with aching and burning that is worse with walking especially pain to her legs.  She is unable to walk more than 50 feet without having to stop.  Review of Systems   Objective: Vital Signs: There were no vitals taken for this visit.  Physical Exam  Ortho Exam  Lower extremity showed no motor or sensory deficits.  Slightly decreased patellar tendon reflexes symmetrically.  There is no clonus.  Specialty Comments:  No specialty comments available.  Imaging: XR Lumbar Spine 2-3 Views  Result Date: 02/05/2021 Multilevel degenerative changes of the discs and facet  joints.  No acute abnormalities.    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   Body mass index 40.0-44.9, adult ([place]) 10/22/2017   Closed displaced oblique fracture of shaft of ulna with malunion, subsequent encounter 07/28/2017   Ulnar impaction syndrome, right 07/07/2017   Arthritis of right wrist 07/07/2017   Pain in right wrist 06/01/2017   Trigger finger, right index finger 09/28/2016   Impaired glucose tolerance 09/09/2015   Hyperparathyroidism ([place]) 05/26/2013   Hereditary and idiopathic peripheral neuropathy 12/29/2012   Morbid obesity ([place])    Menopausal symptoms 11/10/2011   DIVERTICULOSIS, COLON 11/02/2007   Dyslipidemia 03/09/2006   Essential hypertension 03/09/2006   NEPHROLITHIASIS, HX OF 03/09/2006   Past Medical History:  Diagnosis Date   Complication of anesthesia     neuropathy in feet flares after surgery from PAS hose. needs cream on feet   DDD (degenerative disc disease), lumbar 2012   Dental crowns present    GERD (gastroesophageal reflux disease)    occ, no medications at this  time   History of anemia 1991   not since hysterectomy   History of gross hematuria 2002   with kidney stones   History of hyperparathyroidism    History of kidney stones    Hypertension    states under control with meds., has been on med. x 20 yr.   Impingement syndrome of right shoulder 2017   Lymphedema of both lower extremities    due to neuropathy   Lymphedema of both upper extremities    due to neuropathy   Obese    Osteoarthritis    Peripheral neuropathy    hands and feet   Sciatica    Triangular fibrocartilage complex tear 06/2017   right   Ulnar  impaction syndrome, right 06/2017   Wears partial dentures    upper and lower    Family History  Problem Relation Age of Onset   Stroke Mother    Cancer Mother 42       bone cancer   Other Father        unknown medical history   Diabetes Sister     Past Surgical History:  Procedure Laterality Date   ABDOMINAL HYSTERECTOMY  1991   partial   CARPAL TUNNEL RELEASE Right 08/09/2002   CARPAL TUNNEL RELEASE Left 11/03/2006   COLONOSCOPY  01/2016   COLONOSCOPY WITH PROPOFOL  11/24/2011   CYSTOSCOPY WITH RETROGRADE PYELOGRAM, URETEROSCOPY AND STENT PLACEMENT Left 12/18/2014   Procedure: CYSTOSCOPY WITH RETROGRADE PYELOGRAM, URETEROSCOPY AND STENT PLACEMENT left ureter;  Surgeon: Raynelle Bring, MD;  Location: [facility];  Service: Urology;  Laterality: Left;   DILATION AND CURETTAGE OF UTERUS     ELBOW ARTHROSCOPY Right    EXTRACORPOREAL SHOCK WAVE LITHOTRIPSY Left 03/08/2017   Procedure: LEFT EXTRACORPOREAL SHOCK WAVE LITHOTRIPSY (ESWL);  Surgeon: Raynelle Bring, MD;  Location: [facility];  Service: Urology;  Laterality: Left;   HOLMIUM LASER APPLICATION Left 5/0/2774   Procedure: HOLMIUM LASER APPLICATION left ureter ;  Surgeon: Raynelle Bring, MD;  Location: [facility];  Service: Urology;  Laterality: Left;   PARATHYROIDECTOMY Right 05/03/2015   Procedure: RIGHT SUPERIOR PARATHYROIDECTOMY;  Surgeon: Armandina Gemma, MD;  Location: [facility];  Service: General;  Laterality: Right;   PATELLA REALIGNMENT Bilateral    as a teenager   ROTATOR CUFF REPAIR Right    x2   SHOULDER ARTHROSCOPY Right    x 2   SHOULDER ARTHROSCOPY W/ ROTATOR CUFF REPAIR Left 04/26/2002   SYNOVECTOMY WITH POLY EXCHANGE Left 11/28/2018   Procedure: SYNOVECTOMY WITH Posey Pronto;  Surgeon: Vickey Huger, MD;  Location: [facility];  Service: Orthopedics;  Laterality: Left;   TONSILLECTOMY     TOTAL KNEE ARTHROPLASTY Left 09/10/2003   TOTAL KNEE ARTHROPLASTY Right 11/10/2004   TOTAL KNEE ARTHROPLASTY Right 05/02/2018   Procedure: Right  polyethelene exchange and open synovectomy;  Surgeon: Vickey Huger, MD;  Location: [facility];  Service: Orthopedics;  Laterality: Right;   TRIGGER FINGER RELEASE Right 08/06/2008   ring finger   TRIGGER FINGER RELEASE     multiple - right thumb, left index/long/ring/thumb   ULNA OSTEOTOMY Right 07/28/2017   Procedure: REVISION RIGHT ULNAR SHORTENING OSTEOTOMY;  Surgeon: Leandrew Koyanagi, MD;  Location: [facility];  Service: Orthopedics;  Laterality: Right;   WRIST ARTHROSCOPY Left 06/16/2000   WRIST ARTHROSCOPY WITH ULNA SHORTENING Right 07/07/2017   Procedure: RIGHT WRIST ARTHROSCOPY WITH ULNAR SHORTENING OSTEOTOMY;  Surgeon: Leandrew Koyanagi, MD;  Location: [facility];  Service: Orthopedics;  Laterality: Right;  Social History   Occupational History   Occupation: Retired in 2011    Employer: [employer]  Tobacco Use   Smoking status: Never   Smokeless tobacco: Never  Vaping Use   Vaping Use: Never used  Substance and Sexual Activity   Alcohol use: Not Currently   Drug use: Never   Sexual activity: Never    Birth control/protection: Surgical

## 2021-02-07 ENCOUNTER — Other Ambulatory Visit: Payer: Self-pay

## 2021-02-07 DIAGNOSIS — M48062 Spinal stenosis, lumbar region with neurogenic claudication: Secondary | ICD-10-CM

## 2021-03-03 ENCOUNTER — Other Ambulatory Visit: Payer: Self-pay

## 2021-03-03 MED ORDER — LIDOCAINE 5 % EX OINT: 1.0000 "application " | TOPICAL_OINTMENT | Freq: Three times a day (TID) | CUTANEOUS | 2 refills | Status: DC | PRN

## 2021-03-05 ENCOUNTER — Other Ambulatory Visit: Payer: Self-pay | Admitting: Neurological Surgery

## 2021-03-05 DIAGNOSIS — M4316 Spondylolisthesis, lumbar region: Secondary | ICD-10-CM

## 2021-03-25 ENCOUNTER — Ambulatory Visit
Admission: RE | Admit: 2021-03-25 | Discharge: 2021-03-25 | Disposition: A | Discharge status: Home / Self Care | Payer: Medicare Other | Source: Ambulatory Visit | Attending: Neurological Surgery | Admitting: Neurological Surgery

## 2021-03-25 ENCOUNTER — Other Ambulatory Visit: Payer: Self-pay

## 2021-03-25 DIAGNOSIS — M4316 Spondylolisthesis, lumbar region: Secondary | ICD-10-CM

## 2021-03-28 ENCOUNTER — Other Ambulatory Visit: Payer: Medicare Other

## 2021-05-01 ENCOUNTER — Ambulatory Visit: Payer: Medicare Other | Admitting: Family Medicine

## 2021-05-14 ENCOUNTER — Telehealth: Payer: Self-pay | Admitting: Family Medicine

## 2021-05-14 NOTE — Telephone Encounter (Signed)
Pt call and stated she want a call back about her medication .

## 2021-05-23 ENCOUNTER — Telehealth: Payer: Self-pay | Admitting: Family Medicine

## 2021-05-23 NOTE — Telephone Encounter (Signed)
Problem called in stating that she have a problem with the lidocaine (XYLOCAINE) 5 % ointment [[id number]]  and wants someone to explain the breakdown of this.  Patient could be contacted at [phone].  Please advise.

## 2021-05-23 NOTE — Telephone Encounter (Signed)
Can take OTC vitamin B12 supplement.

## 2021-05-28 ENCOUNTER — Other Ambulatory Visit: Payer: Self-pay

## 2021-05-28 MED ORDER — LIDOCAINE 5 % EX OINT: 1.0000 "application " | TOPICAL_OINTMENT | Freq: Three times a day (TID) | CUTANEOUS | 11 refills | Status: DC | PRN

## 2021-05-28 NOTE — Telephone Encounter (Signed)
Spoke with pt, stated she only got 3 tubes of the ointment, informed her that was what they sent the request for. Informed her I would send refills in so they can be mailed out.

## 2021-06-26 ENCOUNTER — Other Ambulatory Visit: Payer: Self-pay | Admitting: Family Medicine

## 2021-06-26 DIAGNOSIS — G609 Hereditary and idiopathic neuropathy, unspecified: Secondary | ICD-10-CM

## 2021-06-27 NOTE — Telephone Encounter (Signed)
Last Ov 01/30/21 Filled 01/24/21

## 2021-07-21 HISTORY — PX: CATARACT EXTRACTION: SUR2

## 2021-09-18 ENCOUNTER — Other Ambulatory Visit: Payer: Self-pay | Admitting: Family Medicine

## 2021-09-25 ENCOUNTER — Encounter (HOSPITAL_COMMUNITY): Payer: Self-pay | Admitting: Emergency Medicine

## 2021-09-25 ENCOUNTER — Other Ambulatory Visit: Payer: Self-pay

## 2021-09-25 ENCOUNTER — Emergency Department (HOSPITAL_COMMUNITY)
Admission: EM | Admit: 2021-09-25 | Discharge: 2021-09-26 | Disposition: A | Discharge status: Home / Self Care | Payer: Medicare Other | Attending: Emergency Medicine | Admitting: Emergency Medicine

## 2021-09-25 DIAGNOSIS — M79662 Pain in left lower leg: Secondary | ICD-10-CM | POA: Diagnosis not present

## 2021-09-25 DIAGNOSIS — M79675 Pain in left toe(s): Secondary | ICD-10-CM | POA: Insufficient documentation

## 2021-09-25 DIAGNOSIS — Z79899 Other long term (current) drug therapy: Secondary | ICD-10-CM | POA: Insufficient documentation

## 2021-09-25 DIAGNOSIS — M79672 Pain in left foot: Secondary | ICD-10-CM

## 2021-09-25 DIAGNOSIS — I10 Essential (primary) hypertension: Secondary | ICD-10-CM | POA: Insufficient documentation

## 2021-09-25 NOTE — ED Triage Notes (Signed)
Patient c/o L great toe pain radiating up to L calf intermittent x3 days but constant today. Hx of neuropathy. States pain is different from neuropathy. Denies trauma or injury.

## 2021-09-26 ENCOUNTER — Emergency Department (HOSPITAL_COMMUNITY): Payer: Medicare Other

## 2021-09-26 ENCOUNTER — Ambulatory Visit (HOSPITAL_BASED_OUTPATIENT_CLINIC_OR_DEPARTMENT_OTHER)
Admission: RE | Admit: 2021-09-26 | Discharge: 2021-09-26 | Disposition: A | Discharge status: Home / Self Care | Payer: Medicare Other | Source: Ambulatory Visit | Attending: Emergency Medicine | Admitting: Emergency Medicine

## 2021-09-26 DIAGNOSIS — M79605 Pain in left leg: Secondary | ICD-10-CM

## 2021-09-26 DIAGNOSIS — M79662 Pain in left lower leg: Secondary | ICD-10-CM | POA: Insufficient documentation

## 2021-09-26 MED ORDER — HYDROCODONE-ACETAMINOPHEN 5-325 MG PO TABS
2.0000 | ORAL_TABLET | Freq: Once | ORAL | Status: AC
  Administered 2021-09-26: 1 via ORAL
  Filled 2021-09-26: qty 2

## 2021-09-26 NOTE — Discharge Instructions (Signed)
Return tomorrow for an ultrasound of your leg to rule out a blood clot.  Continue taking tramadol as previously prescribed as needed for pain.

## 2021-09-26 NOTE — Progress Notes (Signed)
Left lower extremity venous duplex completed. Refer to "CV Proc" under chart review to view preliminary results.  09/26/2021 11:25 AM Kelby Aline., MHA, RVT, RDCS, RDMS

## 2021-09-26 NOTE — ED Provider Notes (Signed)
[facility] DEPT Provider Note   CSN: [id number] Arrival date & time: 09/25/21  1948     History  Chief Complaint  Patient presents with   Toe Pain    Katherine [name] is a 71 y.o. female.  Patient is a 71 year old female with past medical history of peripheral neuropathy, hypertension, hyperlipidemia, hyperparathyroidism, prior total knee replacement.  Patient presenting today with complaints of toe and foot pain.  This started this morning in the absence of any injury or trauma.  She describes pain that starts at the left great toe and extends up her foot, across her ankle, and into her left calf.  She denies any injury or trauma.  She denies any chest pain or difficulty breathing.  Patient has no prior history of DVT or blood clot.  The history is provided by the patient.      Home Medications Prior to Admission medications   Medication Sig Start Date End Date Taking? Authorizing Provider  Cholecalciferol (VITAMIN D) 50 MCG (2000 UT) tablet Take 2,000 Units by mouth daily.     [provider]  cycloSPORINE (RESTASIS) 0.05 % ophthalmic emulsion Place 1 drop into both eyes 2 (two) times daily.    [provider]  hydrochlorothiazide (HYDRODIURIL) 12.5 MG tablet Take 12.5 mg by mouth daily. 11/16/18   [provider]  L-Methylfolate-Algae-B12-B6 Glade Stanford) 3-90.314-2-35 MG CAPS TAKE 1 CAPSULE BY MOUTH TWO TIMES A DAY 01/29/21   Billie Ruddy, MD  lidocaine (XYLOCAINE) 5 % ointment Apply 1 application topically 3 (three) times daily as needed. 05/28/21   Billie Ruddy, MD  lidocaine-prilocaine (EMLA) cream Apply 1 application topically to affected areas 3 times daily as needed. 08/14/20   Billie Ruddy, MD  lisinopril (ZESTRIL) 10 MG tablet TAKE 1 TABLET BY MOUTH  DAILY 01/24/21   Billie Ruddy, MD  Multiple Vitamin (MULTIVITAMIN) tablet Take 1 tablet by mouth daily.    [provider]  nortriptyline (PAMELOR) 25 MG  capsule TAKE 3 CAPSULES BY MOUTH AT BEDTIME 09/18/21   Billie Ruddy, MD  pregabalin (LYRICA) 150 MG capsule TAKE 1 CAPSULE BY MOUTH  TWICE DAILY 06/30/21   Billie Ruddy, MD  traMADol Veatrice Bourbon) 50 MG tablet Take one to two tablets by mouth every 6 hours if needed for pain, Prescribed by Neurology 02/23/20   Billie Ruddy, MD      Allergies    Erythromycin    Review of Systems   Review of Systems  All other systems reviewed and are negative.  Physical Exam Updated Vital Signs BP 135/85   Pulse (!) 112   Temp 98.3 F (36.8 C) (Oral)   Resp 16   SpO2 97%  Physical Exam Vitals and nursing note reviewed.  Constitutional:      General: She is not in acute distress.    Appearance: Normal appearance. She is not ill-appearing.  HENT:     Head: Normocephalic and atraumatic.  Pulmonary:     Effort: Pulmonary effort is normal.  Musculoskeletal:     Comments: The left foot appears grossly normal.  There is no swelling, warmth, or edema.  She has good range of motion of the toes and ankle.  DP pulses are easily palpable and motor is intact throughout the entire foot.  She does describe some decreased sensation related to her neuropathy.  Skin:    General: Skin is warm and dry.  Neurological:     Mental Status: She is alert.  ED Results / Procedures / Treatments   Labs (all labs ordered are listed, but only abnormal results are displayed) Labs Reviewed - No data to display  EKG None  Radiology No results found.  Procedures Procedures    Medications Ordered in ED Medications  HYDROcodone-acetaminophen (NORCO/VICODIN) 5-325 MG per tablet 2 tablet (has no administration in time range)    ED Course/ Medical Decision Making/ A&P  Patient presenting here with complaints of foot and ankle pain as described in the HPI.  This does not appear to be gout and patient does not feel as though this is similar to her neuropathy pain.  Other differential includes sprain, soft  tissue injury, or possible DVT.  Patient will have an outpatient ultrasound arranged for tomorrow, but it seems appropriate for her to be discharged.  Final Clinical Impression(s) / ED Diagnoses Final diagnoses:  None    Rx / DC Orders ED Discharge Orders     None         Veryl Speak, MD 09/26/21 0107

## 2021-10-07 ENCOUNTER — Encounter: Payer: Self-pay | Admitting: Emergency Medicine

## 2021-10-07 ENCOUNTER — Other Ambulatory Visit: Payer: Self-pay

## 2021-10-07 ENCOUNTER — Ambulatory Visit
Admission: EM | Admit: 2021-10-07 | Discharge: 2021-10-07 | Disposition: A | Discharge status: Home / Self Care | Payer: Medicare Other | Attending: Internal Medicine | Admitting: Internal Medicine

## 2021-10-07 DIAGNOSIS — N3001 Acute cystitis with hematuria: Secondary | ICD-10-CM | POA: Insufficient documentation

## 2021-10-07 DIAGNOSIS — R300 Dysuria: Secondary | ICD-10-CM | POA: Diagnosis present

## 2021-10-07 DIAGNOSIS — R350 Frequency of micturition: Secondary | ICD-10-CM | POA: Diagnosis present

## 2021-10-07 LAB — POCT URINALYSIS DIP (MANUAL ENTRY)
Bilirubin, UA: NEGATIVE
Glucose, UA: NEGATIVE mg/dL
Ketones, POC UA: NEGATIVE mg/dL
Nitrite, UA: NEGATIVE
Protein Ur, POC: 100 mg/dL — AB
Spec Grav, UA: 1.02 (ref 1.010–1.025)
Urobilinogen, UA: 0.2 E.U./dL
pH, UA: 7 (ref 5.0–8.0)

## 2021-10-07 MED ORDER — CEPHALEXIN 500 MG PO CAPS: 500.0000 mg | ORAL_CAPSULE | Freq: Four times a day (QID) | ORAL | 0 refills | Status: DC

## 2021-10-07 NOTE — ED Triage Notes (Signed)
Pt sts some dysuria and possible hematuria; pt sts hx of kidney stones

## 2021-10-07 NOTE — Discharge Instructions (Signed)
It appears that you have a urinary tract infection which is being treated with an antibiotic.  Urine culture is pending.  We will call if it is abnormal.  Please follow-up if symptoms persist or worsen.

## 2021-10-07 NOTE — ED Provider Notes (Signed)
[name]    CSN: [id number] Arrival date & time: 10/07/21  1327      History   Chief Complaint Chief Complaint  Patient presents with   Dysuria    HPI Katherine [name] is a 71 y.o. female.   Patient presents with urinary burning, urinary frequency, hematuria that has been present for about 3 days.  She reports mild hematuria noted.  Patient has history of recurrent urinary tract infections as well as kidney stones so she wishes to be evaluated for this.  Denies vaginal discharge, normal vaginal bleeding, back pain, fever, abdominal pain.   Dysuria  Past Medical History:  Diagnosis Date   Complication of anesthesia     neuropathy in feet flares after surgery from PAS hose. needs cream on feet   DDD (degenerative disc disease), lumbar 2012   Dental crowns present    GERD (gastroesophageal reflux disease)    occ, no medications at this time   History of anemia 1991   not since hysterectomy   History of gross hematuria 2002   with kidney stones   History of hyperparathyroidism    History of kidney stones    Hypertension    states under control with meds., has been on med. x 20 yr.   Impingement syndrome of right shoulder 2017   Lymphedema of both lower extremities    due to neuropathy   Lymphedema of both upper extremities    due to neuropathy   Obese    Osteoarthritis    Peripheral neuropathy    hands and feet   Sciatica    Triangular fibrocartilage complex tear 06/2017   right   Ulnar impaction syndrome, right 06/2017   Wears partial dentures    upper and lower    Patient Active Problem List   Diagnosis Date Noted   Spinal stenosis, lumbar region with neurogenic claudication 02/05/2021   Prediabetes 11/06/2019   Peripheral neuropathy 02/09/2019   Chronic right-sided low back pain with right-sided sciatica 02/09/2019   S/P revision of total knee 11/28/2018   S/P total knee replacement 05/02/2018   Body mass index 40.0-44.9, adult ([place])  10/22/2017   Closed displaced oblique fracture of shaft of ulna with malunion, subsequent encounter 07/28/2017   Ulnar impaction syndrome, right 07/07/2017   Arthritis of right wrist 07/07/2017   Pain in right wrist 06/01/2017   Trigger finger, right index finger 09/28/2016   Impaired glucose tolerance 09/09/2015   Hyperparathyroidism ([place]) 05/26/2013   Hereditary and idiopathic peripheral neuropathy 12/29/2012   Morbid obesity ([place])    Menopausal symptoms 11/10/2011   DIVERTICULOSIS, COLON 11/02/2007   Dyslipidemia 03/09/2006   Essential hypertension 03/09/2006   NEPHROLITHIASIS, HX OF 03/09/2006    Past Surgical History:  Procedure Laterality Date   ABDOMINAL HYSTERECTOMY  1991   partial   CARPAL TUNNEL RELEASE Right 08/09/2002   CARPAL TUNNEL RELEASE Left 11/03/2006   COLONOSCOPY  01/2016   COLONOSCOPY WITH PROPOFOL  11/24/2011   CYSTOSCOPY WITH RETROGRADE PYELOGRAM, URETEROSCOPY AND STENT PLACEMENT Left 12/18/2014   Procedure: CYSTOSCOPY WITH RETROGRADE PYELOGRAM, URETEROSCOPY AND STENT PLACEMENT left ureter;  Surgeon: Raynelle Bring, MD;  Location: [facility];  Service: Urology;  Laterality: Left;   DILATION AND CURETTAGE OF UTERUS     ELBOW ARTHROSCOPY Right    EXTRACORPOREAL SHOCK WAVE LITHOTRIPSY Left 03/08/2017   Procedure: LEFT EXTRACORPOREAL SHOCK WAVE LITHOTRIPSY (ESWL);  Surgeon: Raynelle Bring, MD;  Location: [facility];  Service: Urology;  Laterality: Left;   HOLMIUM LASER APPLICATION  Left 12/18/2014   Procedure: HOLMIUM LASER APPLICATION left ureter ;  Surgeon: Raynelle Bring, MD;  Location: [facility];  Service: Urology;  Laterality: Left;   PARATHYROIDECTOMY Right 05/03/2015   Procedure: RIGHT SUPERIOR PARATHYROIDECTOMY;  Surgeon: Armandina Gemma, MD;  Location: [place];  Service: General;  Laterality: Right;   PATELLA REALIGNMENT Bilateral    as a teenager   ROTATOR CUFF REPAIR Right    x2   SHOULDER ARTHROSCOPY Right    x 2   SHOULDER ARTHROSCOPY W/ ROTATOR CUFF REPAIR Left  04/26/2002   SYNOVECTOMY WITH POLY EXCHANGE Left 11/28/2018   Procedure: SYNOVECTOMY WITH Posey Pronto;  Surgeon: Vickey Huger, MD;  Location: [facility];  Service: Orthopedics;  Laterality: Left;   TONSILLECTOMY     TOTAL KNEE ARTHROPLASTY Left 09/10/2003   TOTAL KNEE ARTHROPLASTY Right 11/10/2004   TOTAL KNEE ARTHROPLASTY Right 05/02/2018   Procedure: Right polyethelene exchange and open synovectomy;  Surgeon: Vickey Huger, MD;  Location: [facility];  Service: Orthopedics;  Laterality: Right;   TRIGGER FINGER RELEASE Right 08/06/2008   ring finger   TRIGGER FINGER RELEASE     multiple - right thumb, left index/long/ring/thumb   ULNA OSTEOTOMY Right 07/28/2017   Procedure: REVISION RIGHT ULNAR SHORTENING OSTEOTOMY;  Surgeon: Leandrew Koyanagi, MD;  Location: [place];  Service: Orthopedics;  Laterality: Right;   WRIST ARTHROSCOPY Left 06/16/2000   WRIST ARTHROSCOPY WITH ULNA SHORTENING Right 07/07/2017   Procedure: RIGHT WRIST ARTHROSCOPY WITH ULNAR SHORTENING OSTEOTOMY;  Surgeon: Leandrew Koyanagi, MD;  Location: [place];  Service: Orthopedics;  Laterality: Right;    OB History     Gravida  1   Para  1   Term      Preterm      AB      Living         SAB      IAB      Ectopic      Multiple      Live Births               Home Medications    Prior to Admission medications   Medication Sig Start Date End Date Taking? Authorizing Provider  cephALEXin (KEFLEX) 500 MG capsule Take 1 capsule (500 mg total) by mouth 4 (four) times daily. 10/07/21  Yes Esraa Seres, Hildred Alamin E, FNP  Cholecalciferol (VITAMIN D) 50 MCG (2000 UT) tablet Take 2,000 Units by mouth daily.     [provider]  cycloSPORINE (RESTASIS) 0.05 % ophthalmic emulsion Place 1 drop into both eyes 2 (two) times daily.    [provider]  hydrochlorothiazide (HYDRODIURIL) 12.5 MG tablet Take 12.5 mg by mouth daily. 11/16/18   [provider]  L-Methylfolate-Algae-B12-B6  Glade Stanford) 3-90.314-2-35 MG CAPS TAKE 1 CAPSULE BY MOUTH TWO TIMES A DAY 01/29/21   Billie Ruddy, MD  lidocaine (XYLOCAINE) 5 % ointment Apply 1 application topically 3 (three) times daily as needed. 05/28/21   Billie Ruddy, MD  lidocaine-prilocaine (EMLA) cream Apply 1 application topically to affected areas 3 times daily as needed. 08/14/20   Billie Ruddy, MD  lisinopril (ZESTRIL) 10 MG tablet TAKE 1 TABLET BY MOUTH  DAILY 01/24/21   Billie Ruddy, MD  Multiple Vitamin (MULTIVITAMIN) tablet Take 1 tablet by mouth daily.    [provider]  nortriptyline (PAMELOR) 25 MG capsule TAKE 3 CAPSULES BY MOUTH AT BEDTIME 09/18/21   Billie Ruddy, MD  pregabalin (LYRICA) 150 MG  capsule TAKE 1 CAPSULE BY MOUTH  TWICE DAILY 06/30/21   Billie Ruddy, MD  traMADol Veatrice Bourbon) 50 MG tablet Take one to two tablets by mouth every 6 hours if needed for pain, Prescribed by Neurology 02/23/20   Billie Ruddy, MD    Family History Family History  Problem Relation Age of Onset   Stroke Mother    Cancer Mother 74       bone cancer   Other Father        unknown medical history   Diabetes Sister     Social History Social History   Tobacco Use   Smoking status: Never   Smokeless tobacco: Never  Vaping Use   Vaping Use: Never used  Substance Use Topics   Alcohol use: Not Currently   Drug use: Never     Allergies   Erythromycin   Review of Systems Review of Systems Per HPI  Physical Exam Triage Vital Signs ED Triage Vitals [10/07/21 1359]  Enc Vitals Group     BP 104/68     Pulse Rate (!) 112     Resp 18     Temp 98.3 F (36.8 C)     Temp Source Oral     SpO2 96 %     Weight      Height      Head Circumference      Peak Flow      Pain Score 4     Pain Loc      Pain Edu?      Excl. in [place]?    No data found.  Updated Vital Signs BP 104/68 (BP Location: Left Arm)   Pulse (!) 112   Temp 98.3 F (36.8 C) (Oral)   Resp 18   SpO2 96%   Visual Acuity Right  Eye Distance:   Left Eye Distance:   Bilateral Distance:    Right Eye Near:   Left Eye Near:    Bilateral Near:     Physical Exam Constitutional:      General: She is not in acute distress.    Appearance: Normal appearance. She is not toxic-appearing or diaphoretic.  HENT:     Head: Normocephalic and atraumatic.  Eyes:     Extraocular Movements: Extraocular movements intact.     Conjunctiva/sclera: Conjunctivae normal.  Cardiovascular:     Rate and Rhythm: Normal rate and regular rhythm.     Pulses: Normal pulses.     Heart sounds: Normal heart sounds.  Pulmonary:     Effort: Pulmonary effort is normal. No respiratory distress.     Breath sounds: Normal breath sounds.  Abdominal:     General: Bowel sounds are normal. There is no distension.     Palpations: Abdomen is soft.     Tenderness: There is no abdominal tenderness.  Neurological:     General: No focal deficit present.     Mental Status: She is alert and oriented to person, place, and time. Mental status is at baseline.  Psychiatric:        Mood and Affect: Mood normal.        Behavior: Behavior normal.        Thought Content: Thought content normal.        Judgment: Judgment normal.     UC Treatments / Results  Labs (all labs ordered are listed, but only abnormal results are displayed) Labs Reviewed  POCT URINALYSIS DIP (MANUAL ENTRY) - Abnormal; Notable for the following components:  Result Value   Clarity, UA hazy (*)    Blood, UA small (*)    Protein Ur, POC =100 (*)    Leukocytes, UA Small (1+) (*)    All other components within normal limits  URINE CULTURE    EKG   Radiology No results found.  Procedures Procedures (including critical care time)  Medications Ordered in UC Medications - No data to display  Initial Impression / Assessment and Plan / UC Course  I have reviewed the triage vital signs and the nursing notes.  Pertinent labs & imaging results that were available during my  care of the patient were reviewed by me and considered in my medical decision making (see chart for details).     Urinalysis indicating urinary tract infection.  Low suspicion for kidney stone.  Urine culture pending.  Will treat with cephalexin antibiotic given creatinine clearance is normal.  Patient was given strict return and ER precautions.  Patient verbalized understanding and was agreeable with plan. Final Clinical Impressions(s) / UC Diagnoses   Final diagnoses:  Acute cystitis with hematuria  Dysuria  Urinary frequency     Discharge Instructions      It appears that you have a urinary tract infection which is being treated with an antibiotic.  Urine culture is pending.  We will call if it is abnormal.  Please follow-up if symptoms persist or worsen.    ED Prescriptions     Medication Sig Dispense Auth. Provider   cephALEXin (KEFLEX) 500 MG capsule Take 1 capsule (500 mg total) by mouth 4 (four) times daily. 28 capsule [place], Michele Rockers, [place]      PDMP not reviewed this encounter.   Teodora Medici, [place] 10/07/21 1419

## 2021-10-09 ENCOUNTER — Telehealth (HOSPITAL_COMMUNITY): Payer: Self-pay | Admitting: Emergency Medicine

## 2021-10-09 LAB — URINE CULTURE: Culture: >=100000 — AB

## 2021-10-09 MED ORDER — AMOXICILLIN 500 MG PO CAPS: 500.0000 mg | ORAL_CAPSULE | Freq: Three times a day (TID) | ORAL | 0 refills | Status: AC

## 2021-10-27 ENCOUNTER — Other Ambulatory Visit: Payer: Self-pay | Admitting: Family Medicine

## 2021-10-27 DIAGNOSIS — G609 Hereditary and idiopathic neuropathy, unspecified: Secondary | ICD-10-CM

## 2021-11-12 ENCOUNTER — Encounter: Payer: Self-pay | Admitting: Family Medicine

## 2021-11-12 ENCOUNTER — Ambulatory Visit (INDEPENDENT_AMBULATORY_CARE_PROVIDER_SITE_OTHER): Payer: Medicare Other | Admitting: Family Medicine

## 2021-11-12 VITALS — BP 100/70 | HR 107 | Temp 98.5°F | Ht 64.75 in | Wt 254.4 lb

## 2021-11-12 DIAGNOSIS — G609 Hereditary and idiopathic neuropathy, unspecified: Secondary | ICD-10-CM

## 2021-11-12 DIAGNOSIS — I10 Essential (primary) hypertension: Secondary | ICD-10-CM

## 2021-11-12 DIAGNOSIS — R7303 Prediabetes: Secondary | ICD-10-CM

## 2021-11-12 DIAGNOSIS — Z0000 Encounter for general adult medical examination without abnormal findings: Secondary | ICD-10-CM

## 2021-11-12 DIAGNOSIS — M48062 Spinal stenosis, lumbar region with neurogenic claudication: Secondary | ICD-10-CM

## 2021-11-12 DIAGNOSIS — R300 Dysuria: Secondary | ICD-10-CM

## 2021-11-12 LAB — LIPID PANEL
Cholesterol: 214 mg/dL — ABNORMAL HIGH (ref 0–200)
HDL: 44.2 mg/dL (ref >39.00)
NonHDL: 170.28
Total CHOL/HDL Ratio: 5
Triglycerides: 213 mg/dL — ABNORMAL HIGH (ref 0.0–149.0)
VLDL: 42.6 mg/dL — ABNORMAL HIGH (ref 0.0–40.0)

## 2021-11-12 LAB — CBC WITH DIFFERENTIAL/PLATELET
Basophils Absolute: 0 10*3/uL (ref 0.0–0.1)
Basophils Relative: 0.5 % (ref 0.0–3.0)
Eosinophils Absolute: 0.2 10*3/uL (ref 0.0–0.7)
Eosinophils Relative: 3.5 % (ref 0.0–5.0)
HCT: 41.3 % (ref 36.0–46.0)
Hemoglobin: 14 g/dL (ref 12.0–15.0)
Lymphocytes Relative: 48.8 % — ABNORMAL HIGH (ref 12.0–46.0)
Lymphs Abs: 2.1 10*3/uL (ref 0.7–4.0)
MCHC: 33.9 g/dL (ref 30.0–36.0)
MCV: 88.6 fl (ref 78.0–100.0)
Monocytes Absolute: 0.6 10*3/uL (ref 0.1–1.0)
Monocytes Relative: 13.7 % — ABNORMAL HIGH (ref 3.0–12.0)
Neutro Abs: 1.5 10*3/uL (ref 1.4–7.7)
Neutrophils Relative %: 33.5 % — ABNORMAL LOW (ref 43.0–77.0)
Platelets: 155 10*3/uL (ref 150.0–400.0)
RBC: 4.66 Mil/uL (ref 3.87–5.11)
RDW: 14.2 % (ref 11.5–15.5)
WBC: 4.4 10*3/uL (ref 4.0–10.5)

## 2021-11-12 LAB — POCT URINALYSIS DIPSTICK
Bilirubin, UA: NEGATIVE
Blood, UA: POSITIVE
Glucose, UA: NEGATIVE
Ketones, UA: NEGATIVE
Nitrite, UA: NEGATIVE
Protein, UA: POSITIVE — AB
Spec Grav, UA: 1.015 (ref 1.010–1.025)
Urobilinogen, UA: 2 E.U./dL — AB
pH, UA: 7 (ref 5.0–8.0)

## 2021-11-12 LAB — VITAMIN B12: Vitamin B-12: >1504 pg/mL — ABNORMAL HIGH (ref 211–911)

## 2021-11-12 LAB — TSH: TSH: 1.21 u[IU]/mL (ref 0.35–5.50)

## 2021-11-12 LAB — BASIC METABOLIC PANEL
BUN: 16 mg/dL (ref 6–23)
CO2: 28 mEq/L (ref 19–32)
Calcium: 10.3 mg/dL (ref 8.4–10.5)
Chloride: 100 mEq/L (ref 96–112)
Creatinine, Ser: 1.05 mg/dL (ref 0.40–1.20)
GFR: 53.05 mL/min — ABNORMAL LOW (ref >60.00)
Glucose, Bld: 126 mg/dL — ABNORMAL HIGH (ref 70–99)
Potassium: 3.6 mEq/L (ref 3.5–5.1)
Sodium: 138 mEq/L (ref 135–145)

## 2021-11-12 LAB — LDL CHOLESTEROL, DIRECT: Direct LDL: 131 mg/dL

## 2021-11-12 LAB — T4, FREE: Free T4: 0.74 ng/dL (ref 0.60–1.60)

## 2021-11-12 LAB — HEMOGLOBIN A1C: Hgb A1c MFr Bld: 6.8 % — ABNORMAL HIGH (ref 4.6–6.5)

## 2021-11-12 MED ORDER — NITROFURANTOIN MONOHYD MACRO 100 MG PO CAPS: 100.0000 mg | ORAL_CAPSULE | Freq: Two times a day (BID) | ORAL | 0 refills | Status: AC

## 2021-11-12 NOTE — Progress Notes (Signed)
Subjective:     Katherine [name] is a 71 y.o. female and is here for a comprehensive physical exam. The patient reports doing ok.  Pt with continued neuropathy, starting to move up her leg to knee b/l.  Also noticing on lateral upper thighs.  Wearing compression socks which helps LE edema.  Pt using rollator to aid with ambulation due to spinal stenosis.  Pt seen in UC 10/07/21 for UTI.  Given abx Keflex, but feels like symptoms never fully resolved.  Pt endorses a fall since last OFV, tripped while going into the front door of the house. Denies injury.  Social History   Socioeconomic History   Marital status: Single    Spouse name: Not on file   Number of children: 1   Years of education: some college   Highest education level: Not on file  Occupational History   Occupation: Retired in 2011    Employer: [employer]  Tobacco Use   Smoking status: Never   Smokeless tobacco: Never  Vaping Use   Vaping Use: Never used  Substance and Sexual Activity   Alcohol use: Not Currently   Drug use: Never   Sexual activity: Never    Birth control/protection: Surgical  Other Topics Concern   Not on file  Social History Narrative   Patient is single and lives with her brother.    Patient is right handed.   Caffeine - no daily use.   Social Determinants of Health   Financial Resource Strain: Low Risk  (01/10/2021)   Overall Financial Resource Strain (CARDIA)    Difficulty of Paying Living Expenses: Not hard at all  Food Insecurity: No Food Insecurity (01/10/2021)   Hunger Vital Sign    Worried About Running Out of Food in the Last Year: Never true    Ran Out of Food in the Last Year: Never true  Transportation Needs: No Transportation Needs (01/10/2021)   PRAPARE - Hydrologist (Medical): No    Lack of Transportation (Non-Medical): No  Physical Activity: Sufficiently Active (01/10/2021)   Exercise Vital Sign    Days of Exercise per Week: 3 days    Minutes of Exercise  per Session: 60 min  Stress: No Stress Concern Present (01/10/2021)   [place]    Feeling of Stress : Not at all  Social Connections: Moderately Integrated (01/10/2021)   Social Connection and Isolation Panel [NHANES]    Frequency of Communication with Friends and Family: More than three times a week    Frequency of Social Gatherings with Friends and Family: More than three times a week    Attends Religious Services: More than 4 times per year    Active Member of Clubs or Organizations: Yes    Attends Archivist Meetings: More than 4 times per year    Marital Status: Never married  Intimate Partner Violence: Not At Risk (01/10/2021)   Humiliation, Afraid, Rape, and Kick questionnaire    Fear of Current or Ex-Partner: No    Emotionally Abused: No    Physically Abused: No    Sexually Abused: No   Health Maintenance  Topic Date Due   Zoster Vaccines- Shingrix (1 of 2) Never done   COVID-19 Vaccine (4 - Moderna series) 05/25/2020   MAMMOGRAM  11/21/2021   INFLUENZA VACCINE  12/09/2021   COLONOSCOPY (Pts 45-82yr Insurance coverage will need to be confirmed)  01/15/2025   TETANUS/TDAP  03/01/2028  Pneumonia Vaccine 76+ Years old  Completed   DEXA SCAN  Completed   Hepatitis C Screening  Completed   HPV VACCINES  Aged Out    The following portions of the patient's history were reviewed and updated as appropriate: allergies, current medications, past family history, past medical history, past social history, past surgical history, and problem list.  Review of Systems Pertinent items noted in HPI and remainder of comprehensive ROS otherwise negative.   Objective:    BP 100/70 (BP Location: Right Arm, Patient Position: Sitting, Cuff Size: Large)   Pulse (!) 107   Temp 98.5 F (36.9 C) (Oral)   Ht 5' 4.75" (1.645 m)   Wt 254 lb 6.4 oz (115.4 kg)   SpO2 97%   BMI 42.66 kg/m  General appearance: alert,  cooperative, and no distress wearing black copper compression gloves on bilateral hands Head: Normocephalic, without obvious abnormality, atraumatic Eyes: conjunctivae/corneas clear. PERRL, EOM's intact. Fundi benign. Ears: normal TM's and external ear canals both ears Nose: Nares normal. Septum midline. Mucosa normal. No drainage or sinus tenderness. Throat: lips, mucosa, and tongue normal; teeth and gums normal Neck: no adenopathy, no carotid bruit, no JVD, supple, symmetrical, trachea midline, and thyroid not enlarged, symmetric, no tenderness/mass/nodules Lungs: clear to auscultation bilaterally Heart: regular rate and rhythm, S1, S2 normal, no murmur, click, rub or gallop Abdomen: soft, non-tender; bowel sounds normal; no masses,  no organomegaly Extremities: extremities normal, atraumatic, no cyanosis or edema Pulses: 2+ and symmetric Skin: Skin color, texture, turgor normal. No rashes or lesions Lymph nodes: Cervical, supraclavicular, and axillary nodes normal. Neurologic:  A&O x3, CN II through XII grossly intact, decreased sensation bilateral lower extremities.  Using rollator to aid with ambulation.  Decreased back pain with leaning forward.   Assessment:    Healthy female exam.      Plan:    Anticipatory guidance given including wearing seatbelts, smoke detectors in the home, increasing physical activity, increasing p.o. intake of water and vegetables. -Obtain labs -Colonoscopy done 01/16/2015 -Mammogram done 11/21/2020.  Patient to schedule upcoming study. -last pap 2001, s/p hysterectomy. -immunizations reviewed -given hanout -next CPE in 1 yr See After Visit Summary for Counseling Recommendations   Dysuria -POC UA with 3+ leuks, protein, Urobilinogen 2.0, RBCs -start Macrobid -Will send in Ucx -Increase p.o. intake of water and fluids -Adjust ABX if needed based on culture results. -Previous culture obtained 10/07/2021 with E. faecalis  - Plan: CBC with  Differential/Platelet, POCT urinalysis dipstick, nitrofurantoin, macrocrystal-monohydrate, (MACROBID) 100 MG capsule, Culture, Urine  Essential hypertension  -Controlled -Continue lifestyle modifications -Continue current medications lisinopril 10 mg daily, hydrochlorothiazide 12.5 mg daily - Plan: CBC with Differential/Platelet, Basic metabolic panel  Spinal stenosis, lumbar region with neurogenic claudication -Continue follow-up with Ortho/neurosurgery  Hereditary and idiopathic peripheral neuropathy -Continue Lyrica 150 mg twice daily, nortriptyline 75 mg nightly, Emla cream as needed  - Plan: CBC with Differential/Platelet, TSH, T4, Free, Vitamin B12  Prediabetes  -Lifestyle modifications - Plan: Hemoglobin A1c, Lipid panel  F/u prn   Grier Mitts, MD

## 2021-11-14 ENCOUNTER — Other Ambulatory Visit: Payer: Self-pay

## 2021-11-14 DIAGNOSIS — E785 Hyperlipidemia, unspecified: Secondary | ICD-10-CM

## 2021-11-14 LAB — URINE CULTURE
MICRO NUMBER:: 13606495
SPECIMEN QUALITY:: ADEQUATE

## 2021-11-20 ENCOUNTER — Ambulatory Visit (INDEPENDENT_AMBULATORY_CARE_PROVIDER_SITE_OTHER): Payer: Medicare Other | Admitting: Family Medicine

## 2021-11-20 ENCOUNTER — Other Ambulatory Visit: Payer: Self-pay | Admitting: Family Medicine

## 2021-11-20 VITALS — BP 100/74 | HR 105 | Temp 98.5°F | Wt 254.0 lb

## 2021-11-20 DIAGNOSIS — L918 Other hypertrophic disorders of the skin: Secondary | ICD-10-CM | POA: Diagnosis not present

## 2021-11-20 DIAGNOSIS — D229 Melanocytic nevi, unspecified: Secondary | ICD-10-CM

## 2021-11-20 NOTE — Progress Notes (Signed)
Subjective:    Patient ID: Katherine [name], female    DOB: Sep 24, 1950, 71 y.o.   MRN: [id number]  Chief Complaint  Patient presents with   OTHER    Patient is here for doctor to check on her moles. She wants to know if they can be remove. Stated it interfere  with her hair .    HPI Patient was seen today for nevii removal.  Pt with numerous nevi around neck, face, and within hair line.  Pt inquires about removal of several nevi as they are painful/cause irritation when coming here.  Patient states she would like to get her haircut in the back but is unable 2/2 several nevi within the hairline.  A larger nevi on right lateral neck sensitive//sore as well as the one on left temple within hairline.  Patient endorses having several moles removed in the past.  Not currently taking blood thinners or aspirin.  Denies allergies to anesthetic.  Past Medical History:  Diagnosis Date   Complication of anesthesia     neuropathy in feet flares after surgery from PAS hose. needs cream on feet   DDD (degenerative disc disease), lumbar 2012   Dental crowns present    GERD (gastroesophageal reflux disease)    occ, no medications at this time   History of anemia 1991   not since hysterectomy   History of gross hematuria 2002   with kidney stones   History of hyperparathyroidism    History of kidney stones    Hypertension    states under control with meds., has been on med. x 20 yr.   Impingement syndrome of right shoulder 2017   Lymphedema of both lower extremities    due to neuropathy   Lymphedema of both upper extremities    due to neuropathy   Obese    Osteoarthritis    Peripheral neuropathy    hands and feet   Sciatica    Triangular fibrocartilage complex tear 06/2017   right   Ulnar impaction syndrome, right 06/2017   Wears partial dentures    upper and lower    Allergies  Allergen Reactions   Erythromycin Other (See Comments)    CHEST PAIN    ROS General: Denies fever, chills,  night sweats, changes in weight, changes in appetite HEENT: Denies headaches, ear pain, changes in vision, rhinorrhea, sore throat CV: Denies CP, palpitations, SOB, orthopnea Pulm: Denies SOB, cough, wheezing GI: Denies abdominal pain, nausea, vomiting, diarrhea, constipation GU: Denies dysuria, hematuria, frequency, vaginal discharge Msk: Denies muscle cramps, joint pains Neuro: Denies weakness, numbness, tingling Skin: Denies rashes, bruising  +numerous nevi Psych: Denies depression, anxiety, hallucinations     Objective:    Blood pressure 100/74, pulse (!) 105, temperature 98.5 F (36.9 C), temperature source Oral, weight 254 lb (115.2 kg), SpO2 94 %.  Gen. Pleasant, well-nourished, in no distress, normal affect   HEENT: [place]/AT, face symmetric, conjunctiva clear, no scleral icterus, PERRLA, EOMI, nares patent without drainage Lungs: no accessory muscle use Cardiovascular: RRR, no peripheral edema Neuro:  A&Ox3, CN II-XII intact, normal gait Skin:  Warm, no lesions/ rash.  Numerous nevi and skin tags on neck, face.   Wt Readings from Last 3 Encounters:  11/20/21 254 lb (115.2 kg)  11/12/21 254 lb 6.4 oz (115.4 kg)  01/30/21 254 lb 3.2 oz (115.3 kg)    Lab Results  Component Value Date   WBC 4.4 11/12/2021   HGB 14.0 11/12/2021   HCT 41.3 11/12/2021   PLT  155.0 11/12/2021   GLUCOSE 126 (H) 11/12/2021   CHOL 214 (H) 11/12/2021   TRIG 213.0 (H) 11/12/2021   HDL 44.20 11/12/2021   LDLDIRECT 131.0 11/12/2021   LDLCALC 126 (H) 01/30/2021   ALT 26 01/30/2021   AST 51 (H) 01/30/2021   NA 138 11/12/2021   K 3.6 11/12/2021   CL 100 11/12/2021   CREATININE 1.05 11/12/2021   BUN 16 11/12/2021   CO2 28 11/12/2021   TSH 1.21 11/12/2021   HGBA1C 6.8 (H) 11/12/2021  Procedure: Skin tag removal Informed consent:  Discussed risks (permanent scarring, infection, pain, bleeding, bruising, redness, and recurrence of the lesion) and benefits of the procedure, as well as the  alternatives.  She is aware that skin tags are benign lesions, and their removal is often not considered medically necessary.  Informed consent was obtained. Anesthesia: topical lidocaine cream and lidocaine 1% inj  The area was prepared and draped in a standard fashion. Snip removal was performed.  A 10 blade scalpel used to remove lesions on L temple, R neck inferior to R ear, and L lateral neck. Cauterization used to achieve hemostasis of several lesions. Antibiotic ointment and a sterile dressing were applied.   The patient tolerated procedure well. The patient was instructed on post-op care.   Number of lesions removed:  6.    Assessment/Plan:  Multiple nevi -skin tags removed at pt request.  Pt tolerated procedure well. -2 lesions both possible cutaneous horns, removed from L temple and R lateral neck inferior to R ear sent to path.  - Plan: Dermatology pathology, Dermatology pathology  F/u prn  Grier Mitts, MD

## 2021-11-27 LAB — HM MAMMOGRAPHY

## 2021-11-30 ENCOUNTER — Encounter: Payer: Self-pay | Admitting: Family Medicine

## 2021-12-02 ENCOUNTER — Encounter: Payer: Self-pay | Admitting: Family Medicine

## 2021-12-02 NOTE — Progress Notes (Signed)
[facility] 

## 2021-12-17 ENCOUNTER — Encounter: Payer: Self-pay | Admitting: Gastroenterology

## 2022-01-13 ENCOUNTER — Ambulatory Visit (INDEPENDENT_AMBULATORY_CARE_PROVIDER_SITE_OTHER): Payer: Medicare Other

## 2022-01-13 VITALS — Ht 64.5 in | Wt 249.0 lb

## 2022-01-13 DIAGNOSIS — Z0000 Encounter for general adult medical examination without abnormal findings: Secondary | ICD-10-CM

## 2022-01-13 NOTE — Patient Instructions (Signed)
Ms. Crumble , Thank you for taking time to come for your Medicare Wellness Visit. I appreciate your ongoing commitment to your health goals. Please review the following plan we discussed and let me know if I can assist you in the future.   Screening recommendations/referrals: Colonoscopy: completed 01/16/2015, due 01/15/2025 Mammogram: completed 11/27/2021, due 11/29/2022 Bone Density: completed 11/04/2010 Recommended yearly ophthalmology/optometry visit for glaucoma screening and checkup Recommended yearly dental visit for hygiene and checkup  Vaccinations: Influenza vaccine: due Pneumococcal vaccine: completed 06/22/2016 Tdap vaccine: completed 03/01/2018, due 03/01/2028 Shingles vaccine: discussed   Covid-19: 03/30/2020, 07/07/2019, 06/02/2019  Advanced directives: copy in chart  Conditions/risks identified: none  Next appointment: Follow up in one year for your annual wellness visit    Preventive Care 53 Years and Older, Female Preventive care refers to lifestyle choices and visits with your health care provider that can promote health and wellness. What does preventive care include? A yearly physical exam. This is also called an annual well check. Dental exams once or twice a year. Routine eye exams. Ask your health care provider how often you should have your eyes checked. Personal lifestyle choices, including: Daily care of your teeth and gums. Regular physical activity. Eating a healthy diet. Avoiding tobacco and drug use. Limiting alcohol use. Practicing safe sex. Taking low-dose aspirin every day. Taking vitamin and mineral supplements as recommended by your health care provider. What happens during an annual well check? The services and screenings done by your health care provider during your annual well check will depend on your age, overall health, lifestyle risk factors, and family history of disease. Counseling  Your health care provider may ask you questions about  your: Alcohol use. Tobacco use. Drug use. Emotional well-being. Home and relationship well-being. Sexual activity. Eating habits. History of falls. Memory and ability to understand (cognition). Work and work Statistician. Reproductive health. Screening  You may have the following tests or measurements: Height, weight, and BMI. Blood pressure. Lipid and cholesterol levels. These may be checked every 5 years, or more frequently if you are over 71 years old. Skin check. Lung cancer screening. You may have this screening every year starting at age 43 if you have a 30-pack-year history of smoking and currently smoke or have quit within the past 15 years. Fecal occult blood test (FOBT) of the stool. You may have this test every year starting at age 103. Flexible sigmoidoscopy or colonoscopy. You may have a sigmoidoscopy every 5 years or a colonoscopy every 10 years starting at age 80. Hepatitis C blood test. Hepatitis B blood test. Sexually transmitted disease (STD) testing. Diabetes screening. This is done by checking your blood sugar (glucose) after you have not eaten for a while (fasting). You may have this done every 1-3 years. Bone density scan. This is done to screen for osteoporosis. You may have this done starting at age 34. Mammogram. This may be done every 1-2 years. Talk to your health care provider about how often you should have regular mammograms. Talk with your health care provider about your test results, treatment options, and if necessary, the need for more tests. Vaccines  Your health care provider may recommend certain vaccines, such as: Influenza vaccine. This is recommended every year. Tetanus, diphtheria, and acellular pertussis (Tdap, Td) vaccine. You may need a Td booster every 10 years. Zoster vaccine. You may need this after age 44. Pneumococcal 13-valent conjugate (PCV13) vaccine. One dose is recommended after age 12. Pneumococcal polysaccharide (PPSV23) vaccine.  One dose  is recommended after age 16. Talk to your health care provider about which screenings and vaccines you need and how often you need them. This information is not intended to replace advice given to you by your health care provider. Make sure you discuss any questions you have with your health care provider. Document Released: 05/24/2015 Document Revised: 01/15/2016 Document Reviewed: 02/26/2015 Elsevier Interactive Patient Education  2017 [place] Prevention in the Home Falls can cause injuries. They can happen to people of all ages. There are many things you can do to make your home safe and to help prevent falls. What can I do on the outside of my home? Regularly fix the edges of walkways and driveways and fix any cracks. Remove anything that might make you trip as you walk through a door, such as a raised step or threshold. Trim any bushes or trees on the path to your home. Use bright outdoor lighting. Clear any walking paths of anything that might make someone trip, such as rocks or tools. Regularly check to see if handrails are loose or broken. Make sure that both sides of any steps have handrails. Any raised decks and porches should have guardrails on the edges. Have any leaves, snow, or ice cleared regularly. Use sand or salt on walking paths during winter. Clean up any spills in your garage right away. This includes oil or grease spills. What can I do in the bathroom? Use night lights. Install grab bars by the toilet and in the tub and shower. Do not use towel bars as grab bars. Use non-skid mats or decals in the tub or shower. If you need to sit down in the shower, use a plastic, non-slip stool. Keep the floor dry. Clean up any water that spills on the floor as soon as it happens. Remove soap buildup in the tub or shower regularly. Attach bath mats securely with double-sided non-slip rug tape. Do not have throw rugs and other things on the floor that can make  you trip. What can I do in the bedroom? Use night lights. Make sure that you have a light by your bed that is easy to reach. Do not use any sheets or blankets that are too big for your bed. They should not hang down onto the floor. Have a firm chair that has side arms. You can use this for support while you get dressed. Do not have throw rugs and other things on the floor that can make you trip. What can I do in the kitchen? Clean up any spills right away. Avoid walking on wet floors. Keep items that you use a lot in easy-to-reach places. If you need to reach something above you, use a strong step stool that has a grab bar. Keep electrical cords out of the way. Do not use floor polish or wax that makes floors slippery. If you must use wax, use non-skid floor wax. Do not have throw rugs and other things on the floor that can make you trip. What can I do with my stairs? Do not leave any items on the stairs. Make sure that there are handrails on both sides of the stairs and use them. Fix handrails that are broken or loose. Make sure that handrails are as long as the stairways. Check any carpeting to make sure that it is firmly attached to the stairs. Fix any carpet that is loose or worn. Avoid having throw rugs at the top or bottom of the stairs. If  you do have throw rugs, attach them to the floor with carpet tape. Make sure that you have a light switch at the top of the stairs and the bottom of the stairs. If you do not have them, ask someone to add them for you. What else can I do to help prevent falls? Wear shoes that: Do not have high heels. Have rubber bottoms. Are comfortable and fit you well. Are closed at the toe. Do not wear sandals. If you use a stepladder: Make sure that it is fully opened. Do not climb a closed stepladder. Make sure that both sides of the stepladder are locked into place. Ask someone to hold it for you, if possible. Clearly mark and make sure that you can  see: Any grab bars or handrails. First and last steps. Where the edge of each step is. Use tools that help you move around (mobility aids) if they are needed. These include: Canes. Walkers. Scooters. Crutches. Turn on the lights when you go into a dark area. Replace any light bulbs as soon as they burn out. Set up your furniture so you have a clear path. Avoid moving your furniture around. If any of your floors are uneven, fix them. If there are any pets around you, be aware of where they are. Review your medicines with your doctor. Some medicines can make you feel dizzy. This can increase your chance of falling. Ask your doctor what other things that you can do to help prevent falls. This information is not intended to replace advice given to you by your health care provider. Make sure you discuss any questions you have with your health care provider. Document Released: 02/21/2009 Document Revised: 10/03/2015 Document Reviewed: 06/01/2014 Elsevier Interactive Patient Education  2017 [employer].

## 2022-01-13 NOTE — Progress Notes (Signed)
I connected with Katherine [name] today by telephone and verified that I am speaking with the correct person using two identifiers. Location patient: home Location provider: work Persons participating in the virtual visit: [name], Bulger LPN.   I discussed the limitations, risks, security and privacy concerns of performing an evaluation and management service by telephone and the availability of in person appointments. I also discussed with the patient that there may be a patient responsible charge related to this service. The patient expressed understanding and verbally consented to this telephonic visit.    Interactive audio and video telecommunications were attempted between this provider and patient, however failed, due to patient having technical difficulties OR patient did not have access to video capability.  We continued and completed visit with audio only.     Vital signs may be patient reported or missing.  Subjective:   Katherine [name] is a 71 y.o. female who presents for Medicare Annual (Subsequent) preventive examination.  Review of Systems     Cardiac Risk Factors include: advanced age (>37mn, >>58women);dyslipidemia;hypertension;obesity (BMI >30kg/m2)     Objective:    Today's Vitals   01/13/22 1137  Weight: 249 lb (112.9 kg)  Height: 5' 4.5" (1.638 m)   Body mass index is 42.08 kg/m.     01/13/2022   11:42 AM 01/10/2021    9:06 AM 12/28/2019    9:14 AM 11/28/2018    9:02 AM 05/02/2018    3:00 PM 04/26/2018    8:18 AM 07/28/2017    7:12 AM  Advanced Directives  Does Patient Have a Medical Advance Directive? Yes Yes Yes No Yes Yes No  Type of AParamedicof [place]Living will [place]Living will [place]if[place]Living will  HFort LauderdaleLiving will [place]unklinLiving will   Does patient want to make changes to medical advance directive?   No - Patient  declined  No - Patient declined No - Patient declined   Copy of HErwinin Chart? Yes - validated most recent copy scanned in chart (See row information) No - copy requested Yes - validated most recent copy scanned in chart (See row information)  Yes - validated most recent copy scanned in chart (See row information) Yes - validated most recent copy scanned in chart (See row information)   Would patient like information on creating a medical advance directive?    No - Patient declined   No - Patient declined    Current Medications (verified) Outpatient Encounter Medications as of 01/13/2022  Medication Sig   Cholecalciferol (VITAMIN D) 50 MCG (2000 UT) tablet Take 2,000 Units by mouth daily.    Cyanocobalamin (VITAMIN B-12 PO) Take by mouth.   cycloSPORINE (RESTASIS) 0.05 % ophthalmic emulsion Place 1 drop into both eyes 2 (two) times daily.   hydrochlorothiazide (HYDRODIURIL) 12.5 MG tablet Take 12.5 mg by mouth daily.   lidocaine (XYLOCAINE) 5 % ointment Apply 1 application topically 3 (three) times daily as needed.   lidocaine-prilocaine (EMLA) cream Apply 1 application topically to affected areas 3 times daily as needed.   lisinopril (ZESTRIL) 10 MG tablet TAKE 1 TABLET BY MOUTH  DAILY   Multiple Vitamin (MULTIVITAMIN) tablet Take 1 tablet by mouth daily.   nortriptyline (PAMELOR) 25 MG capsule TAKE 3 CAPSULES BY MOUTH AT BEDTIME   pregabalin (LYRICA) 150 MG capsule TAKE 1 CAPSULE BY MOUTH TWICE  DAILY   traMADol (ULTRAM) 50 MG tablet Take one to  two tablets by mouth every 6 hours if needed for pain, Prescribed by Neurology   No facility-administered encounter medications on file as of 01/13/2022.    Allergies (verified) Erythromycin   History: Past Medical History:  Diagnosis Date   Complication of anesthesia     neuropathy in feet flares after surgery from PAS hose. needs cream on feet   DDD (degenerative disc disease), lumbar 2012   Dental crowns present     GERD (gastroesophageal reflux disease)    occ, no medications at this time   History of anemia 1991   not since hysterectomy   History of gross hematuria 2002   with kidney stones   History of hyperparathyroidism    History of kidney stones    Hypertension    states under control with meds., has been on med. x 20 yr.   Impingement syndrome of right shoulder 2017   Lymphedema of both lower extremities    due to neuropathy   Lymphedema of both upper extremities    due to neuropathy   Obese    Osteoarthritis    Peripheral neuropathy    hands and feet   Sciatica    Triangular fibrocartilage complex tear 06/2017   right   Ulnar impaction syndrome, right 06/2017   Wears partial dentures    upper and lower   Past Surgical History:  Procedure Laterality Date   ABDOMINAL HYSTERECTOMY  1991   partial   CARPAL TUNNEL RELEASE Right 08/09/2002   CARPAL TUNNEL RELEASE Left 11/03/2006   CATARACT EXTRACTION Left 07/21/2021   COLONOSCOPY  01/2016   COLONOSCOPY WITH PROPOFOL  11/24/2011   CYSTOSCOPY WITH RETROGRADE PYELOGRAM, URETEROSCOPY AND STENT PLACEMENT Left 12/18/2014   Procedure: CYSTOSCOPY WITH RETROGRADE PYELOGRAM, URETEROSCOPY AND STENT PLACEMENT left ureter;  Surgeon: Raynelle Bring, MD;  Location: [facility];  Service: Urology;  Laterality: Left;   DILATION AND CURETTAGE OF UTERUS     ELBOW ARTHROSCOPY Right    EXTRACORPOREAL SHOCK WAVE LITHOTRIPSY Left 03/08/2017   Procedure: LEFT EXTRACORPOREAL SHOCK WAVE LITHOTRIPSY (ESWL);  Surgeon: Raynelle Bring, MD;  Location: [facility];  Service: Urology;  Laterality: Left;   HOLMIUM LASER APPLICATION Left 78/29/5621   Procedure: HOLMIUM LASER APPLICATION left ureter ;  Surgeon: Raynelle Bring, MD;  Location: [facility];  Service: Urology;  Laterality: Left;   PARATHYROIDECTOMY Right 05/03/2015   Procedure: RIGHT SUPERIOR PARATHYROIDECTOMY;  Surgeon: Armandina Gemma, MD;  Location: [facility];  Service: General;  Laterality: Right;   PATELLA REALIGNMENT  Bilateral    as a teenager   ROTATOR CUFF REPAIR Right    x2   SHOULDER ARTHROSCOPY Right    x 2   SHOULDER ARTHROSCOPY W/ ROTATOR CUFF REPAIR Left 04/26/2002   SYNOVECTOMY WITH POLY EXCHANGE Left 11/28/2018   Procedure: SYNOVECTOMY WITH Posey Pronto;  Surgeon: Vickey Huger, MD;  Location: [facility];  Service: Orthopedics;  Laterality: Left;   TONSILLECTOMY     TOTAL KNEE ARTHROPLASTY Left 09/10/2003   TOTAL KNEE ARTHROPLASTY Right 11/10/2004   TOTAL KNEE ARTHROPLASTY Right 05/02/2018   Procedure: Right polyethelene exchange and open synovectomy;  Surgeon: Vickey Huger, MD;  Location: [facility];  Service: Orthopedics;  Laterality: Right;   TRIGGER FINGER RELEASE Right 08/06/2008   ring finger   TRIGGER FINGER RELEASE     multiple - right thumb, left index/long/ring/thumb   ULNA OSTEOTOMY Right 07/28/2017   Procedure: REVISION RIGHT ULNAR SHORTENING OSTEOTOMY;  Surgeon: Leandrew Koyanagi, MD;  Location: [facility];  Service:  Orthopedics;  Laterality: Right;   WRIST ARTHROSCOPY Left 06/16/2000   WRIST ARTHROSCOPY WITH ULNA SHORTENING Right 07/07/2017   Procedure: RIGHT WRIST ARTHROSCOPY WITH ULNAR SHORTENING OSTEOTOMY;  Surgeon: Leandrew Koyanagi, MD;  Location: [facility];  Service: Orthopedics;  Laterality: Right;   Family History  Problem Relation Age of Onset   Stroke Mother    Cancer Mother 20       bone cancer   Other Father        unknown medical history   Diabetes Sister    Social History   Socioeconomic History   Marital status: Single    Spouse name: Not on file   Number of children: 1   Years of education: some college   Highest education level: 12th grade  Occupational History   Occupation: Retired in 2011    Employer: [employer]  Tobacco Use   Smoking status: Never   Smokeless tobacco: Never  Vaping Use   Vaping Use: Never used  Substance and Sexual Activity   Alcohol use: Not Currently   Drug use: Never   Sexual activity: Never     Birth control/protection: Surgical  Other Topics Concern   Not on file  Social History Narrative   Patient is single and lives with her brother.    Patient is right handed.   Caffeine - no daily use.   Social Determinants of Health   Financial Resource Strain: Low Risk  (01/13/2022)   Overall Financial Resource Strain (CARDIA)    Difficulty of Paying Living Expenses: Not hard at all  Food Insecurity: No Food Insecurity (01/13/2022)   Hunger Vital Sign    Worried About Running Out of Food in the Last Year: Never true    Ran Out of Food in the Last Year: Never true  Transportation Needs: No Transportation Needs (01/13/2022)   PRAPARE - Hydrologist (Medical): No    Lack of Transportation (Non-Medical): No  Physical Activity: Insufficiently Active (01/13/2022)   Exercise Vital Sign    Days of Exercise per Week: 3 days    Minutes of Exercise per Session: 40 min  Stress: No Stress Concern Present (01/13/2022)   Firth    Feeling of Stress : Not at all  Social Connections: Moderately Integrated (11/16/2021)   Social Connection and Isolation Panel [NHANES]    Frequency of Communication with Friends and Family: More than three times a week    Frequency of Social Gatherings with Friends and Family: Twice a week    Attends Religious Services: More than 4 times per year    Active Member of [employer] or Organizations: Yes    Attends Music therapist: More than 4 times per year    Marital Status: Never married    Tobacco Counseling Counseling given: Not Answered   Clinical Intake:  Pre-visit preparation completed: Yes  Pain : No/denies pain     Nutritional Status: BMI > 30  Obese Nutritional Risks: None Diabetes: No  How often do you need to have someone help you when you read instructions, pamphlets, or other written materials from your doctor or pharmacy?: 1 - Never What is the  last grade level you completed in school?: trade school  Diabetic? no  Interpreter Needed?: No  Information entered by :: NAllen LPN   Activities of Daily Living    01/13/2022   11:44 AM 01/12/2022   12:48 PM  In  your present state of health, do you have any difficulty performing the following activities:  Hearing? 0 0  Vision? 0 0  Difficulty concentrating or making decisions? 0 0  Walking or climbing stairs? 1 1  Dressing or bathing? 0 0  Doing errands, shopping? 0 0  Preparing Food and eating ? N N  Using the Toilet? N N  In the past six months, have you accidently leaked urine? N N  Do you have problems with loss of bowel control? N N  Managing your Medications? N N  Managing your Finances? N N  Housekeeping or managing your Housekeeping? N N    Patient Care Team: Billie Ruddy, MD as PCP - General (Family Medicine)  Indicate any recent Medical Services you may have received from other than [facility] providers in the past year (date may be approximate).     Assessment:   This is a routine wellness examination for [place].  Hearing/Vision screen Vision Screening - Comments:: Regular eye exams, Dr. Venetia Maxon  Dietary issues and exercise activities discussed: Current Exercise Habits: Home exercise routine, Type of exercise: Other - see comments (swimming), Time (Minutes): 40, Frequency (Times/Week): 3, Weekly Exercise (Minutes/Week): 120   Goals Addressed             This Visit's Progress    Patient Stated       01/13/2022, wants to lose weight and get pain under control in back       Depression Screen    01/13/2022   11:43 AM 11/20/2021   10:43 AM 11/12/2021    8:53 AM 01/10/2021    9:08 AM 01/10/2021    9:05 AM 12/28/2019    9:16 AM 09/09/2015    8:44 AM  PHQ 2/9 Scores  PHQ - 2 Score 0 1 3 0 0 0 0  PHQ- 9 Score  5 5   0     Fall Risk    01/13/2022   11:42 AM 01/12/2022   12:48 PM 11/20/2021   11:05 AM 11/16/2021   10:05 AM 11/12/2021    8:54 AM  Fall Risk    Falls in the past year? '1 1 1 1 1  '$ Comment lost balance      Number falls in past yr: 0 0 0 0 0  Injury with Fall? 0 0 0 0 0  Risk for fall due to : Medication side effect  Impaired balance/gait  Impaired balance/gait  Follow up Falls evaluation completed;Education provided;Falls prevention discussed  Falls evaluation completed  Falls evaluation completed    FALL RISK PREVENTION PERTAINING TO THE HOME:  Any stairs in or around the home? No  If so, are there any without handrails? N/a Home free of loose throw rugs in walkways, pet beds, electrical cords, etc? Yes  Adequate lighting in your home to reduce risk of falls? Yes   ASSISTIVE DEVICES UTILIZED TO PREVENT FALLS:  Life alert? No  Use of a cane, walker or w/c? Yes  Grab bars in the bathroom? No  Shower chair or bench in shower? Yes  Elevated toilet seat or a handicapped toilet? Yes   TIMED UP AND GO:  Was the test performed? No .      Cognitive Function:        01/13/2022   11:44 AM 12/28/2019    9:21 AM  6CIT Screen  What Year? 0 points 0 points  What month? 0 points 0 points  What time? 0 points 0 points  Count back  from 20 0 points 0 points  Months in reverse 0 points 0 points  Repeat phrase 2 points 2 points  Total Score 2 points 2 points    Immunizations Immunization History  Administered Date(s) Administered   Fluad Quad(high Dose 65+) 03/09/2019, 01/24/2020, 01/30/2021   Influenza Split 02/18/2011, 05/26/2013   Influenza, High Dose Seasonal PF 03/11/2016, 03/24/2017, 02/09/2018   Influenza,inj,Quad PF,6+ Mos 05/29/2014, 03/12/2015, 05/28/2015   Moderna Sars-Covid-2 Vaccination 06/02/2019, 07/07/2019, 03/30/2020   Pneumococcal Conjugate-13 05/31/2014   Pneumococcal Polysaccharide-23 06/22/2016   Td 01/09/2006   Tdap 03/01/2018   Zoster, Live 02/18/2011    TDAP status: Up to date  Flu Vaccine status: Due, Education has been provided regarding the importance of this vaccine. Advised may receive  this vaccine at local pharmacy or Health Dept. Aware to provide a copy of the vaccination record if obtained from local pharmacy or Health Dept. Verbalized acceptance and understanding.  Pneumococcal vaccine status: Up to date  Covid-19 vaccine status: Completed vaccines  Qualifies for Shingles Vaccine? Yes   Zostavax completed Yes   Shingrix Completed?: No.    Education has been provided regarding the importance of this vaccine. Patient has been advised to call insurance company to determine out of pocket expense if they have not yet received this vaccine. Advised may also receive vaccine at local pharmacy or Health Dept. Verbalized acceptance and understanding.  Screening Tests Health Maintenance  Topic Date Due   Zoster Vaccines- Shingrix (1 of 2) Never done   COVID-19 Vaccine (4 - Moderna series) 05/25/2020   INFLUENZA VACCINE  12/09/2021   MAMMOGRAM  11/28/2022   COLONOSCOPY (Pts 45-44yr Insurance coverage will need to be confirmed)  01/15/2025   TETANUS/TDAP  03/01/2028   Pneumonia Vaccine 71 Years old  Completed   DEXA SCAN  Completed   Hepatitis C Screening  Completed   HPV VACCINES  Aged Out    Health Maintenance  Health Maintenance Due  Topic Date Due   Zoster Vaccines- Shingrix (1 of 2) Never done   COVID-19 Vaccine (4 - Moderna series) 05/25/2020   INFLUENZA VACCINE  12/09/2021    Colorectal cancer screening: Type of screening: Colonoscopy. Completed 01/16/2015. Repeat every 10 years  Mammogram status: Completed 11/27/2021. Repeat every year  Bone Density status: Completed 11/04/2010.  Lung Cancer Screening: (Low Dose CT Chest recommended if Age 517-80years, 30 pack-year currently smoking OR have quit w/in 15years.) does not qualify.   Lung Cancer Screening Referral: no  Additional Screening:  Hepatitis C Screening: does qualify; Completed 02/09/2019  Vision Screening: Recommended annual ophthalmology exams for early detection of glaucoma and other disorders  of the eye. Is the patient up to date with their annual eye exam?  Yes  Who is the provider or what is the name of the office in which the patient attends annual eye exams? Dr. WVenetia MaxonIf pt is not established with a provider, would they like to be referred to a provider to establish care? No .   Dental Screening: Recommended annual dental exams for proper oral hygiene  Community Resource Referral / Chronic Care Management: CRR required this visit?  No   CCM required this visit?  No      Plan:     I have personally reviewed and noted the following in the patient's chart:   Medical and social history Use of alcohol, tobacco or illicit drugs  Current medications and supplements including opioid prescriptions. Patient is currently taking opioid prescriptions. Information provided to patient regarding  non-opioid alternatives. Patient advised to discuss non-opioid treatment plan with their provider. Functional ability and status Nutritional status Physical activity Advanced directives List of other physicians Hospitalizations, surgeries, and ER visits in previous 12 months Vitals Screenings to include cognitive, depression, and falls Referrals and appointments  In addition, I have reviewed and discussed with patient certain preventive protocols, quality metrics, and best practice recommendations. A written personalized care plan for preventive services as well as general preventive health recommendations were provided to patient.     Kellie Simmering, LPN   06/16/2033   Nurse Notes: none  Due to this being a virtual visit, the after visit summary with patients personalized plan was offered to patient via mail or my-chart. Patient would like to access on my-chart

## 2022-01-31 ENCOUNTER — Other Ambulatory Visit: Payer: Self-pay | Admitting: Family Medicine

## 2022-02-09 ENCOUNTER — Telehealth: Payer: Self-pay | Admitting: Family Medicine

## 2022-02-09 NOTE — Telephone Encounter (Signed)
Pt will be here on Monday 02/16/22 for lab work and would like to know if there are any other shots, besides the flu vaccine, that she can get while she is here at [facility]? Pt would like a call back. Please advise. [phone]

## 2022-02-16 ENCOUNTER — Other Ambulatory Visit: Payer: Medicare Other

## 2022-02-16 ENCOUNTER — Ambulatory Visit (INDEPENDENT_AMBULATORY_CARE_PROVIDER_SITE_OTHER): Payer: Medicare Other

## 2022-02-16 DIAGNOSIS — Z23 Encounter for immunization: Secondary | ICD-10-CM | POA: Diagnosis not present

## 2022-02-16 DIAGNOSIS — E785 Hyperlipidemia, unspecified: Secondary | ICD-10-CM

## 2022-02-16 LAB — LIPID PANEL
Cholesterol: 238 mg/dL — ABNORMAL HIGH (ref 0–200)
HDL: 45.7 mg/dL (ref >39.00)
LDL Cholesterol: 160 mg/dL — ABNORMAL HIGH (ref 0–99)
NonHDL: 192.38
Total CHOL/HDL Ratio: 5
Triglycerides: 160 mg/dL — ABNORMAL HIGH (ref 0.0–149.0)
VLDL: 32 mg/dL (ref 0.0–40.0)

## 2022-03-11 ENCOUNTER — Encounter: Payer: Self-pay | Admitting: Family Medicine

## 2022-03-11 ENCOUNTER — Telehealth (INDEPENDENT_AMBULATORY_CARE_PROVIDER_SITE_OTHER): Payer: Medicare Other | Admitting: Family Medicine

## 2022-03-11 DIAGNOSIS — M48062 Spinal stenosis, lumbar region with neurogenic claudication: Secondary | ICD-10-CM

## 2022-03-11 DIAGNOSIS — E782 Mixed hyperlipidemia: Secondary | ICD-10-CM | POA: Diagnosis not present

## 2022-03-11 DIAGNOSIS — H269 Unspecified cataract: Secondary | ICD-10-CM | POA: Diagnosis not present

## 2022-03-11 DIAGNOSIS — E119 Type 2 diabetes mellitus without complications: Secondary | ICD-10-CM | POA: Diagnosis not present

## 2022-03-11 NOTE — Progress Notes (Signed)
Virtual Visit via Telephone Note  I connected with Katherine [name] on 03/11/22 at  9:30 AM EDT by telephone and verified that I am speaking with the correct person using two identifiers.   I discussed the limitations, risks, security and privacy concerns of performing an evaluation and management service by telephone and the availability of in person appointments. I also discussed with the patient that there may be a patient responsible charge related to this service. The patient expressed understanding and agreed to proceed.  Location patient: home Location provider: work or home office Participants present for the call: patient, provider Patient did not have a visit in the prior 7 days to address this/these issue(s).  Chief Complaint  Patient presents with   Follow-up    Wants to go over results and discuss the DM dx. States DM is on the problem list, but was not talked about or made aware of, the eye doctor and neuro doctor are telling her she is diabetic.    History of Present Illness:  Pt states she is doing ok but her back started bothering her more.  Seen by Ortho.  She had 2 injections without relief.  Has been unable to swim/water aerobics for the last 3 wks.  Using walker if walking long distances.  Taking regular meds including Lyrica for history of neuropathy.  Pt states her twin brother passed last wk at hospice in [place].  Pt had cataract surgery in March on R eye.  States eye dr. Inquired if she was diabetic. Wearing glasses until can have cataract removed on the other eye.  Pt inquires about cholesterol.  Using liquid eggs, does not eat cheese and milk, cooking [place].  Drinks fruit based Ensure.  May not eat until 2 or 3 pm, then may have a salad around 7 pm.  Pt states she does not have an appetite at time.  Pt inquires if she is due for colonoscopy.  Received a letter from GI stating she was due.  Per chart review pt had colonoscopy 11/24/11 with 10 yr.  Also had    Cologuard done 01/16/15.   Observations/Objective: Patient sounds cheerful and well on the phone. I do not appreciate any SOB. Speech and thought processing are grossly intact. Patient reported vitals:  Assessment and Plan: Diet-controlled diabetes mellitus ([place]) -hgb A1C 6.8% on 11/12/21. -discussed hemoglobin A1c's placed patient in prediabetic range -Discussed goal A1c -Continue lifestyle modifications -Continue ACE I -Obtain diabetic foot exam and microalbumin at next [facility]. -Continue follow-up with ophthalmology  Mixed hyperlipidemia -Total cholesterol 238, LDL 160, triglycerides 160, HDL 45.7 on 02/16/2022 -Discussed importance of lifestyle modifications -Discussed starting OTC fish oil tablets -For continued elevation consider statin  Spinal stenosis, lumbar region with neurogenic claudication -Continue follow-up with Ortho -Water aerobics as able  Cataract of left eye, unspecified cataract type -s/p removal R cataract -continue f/u with Ophthalmology  Per chart review it appears patient had colonoscopy 11/23/2021 with 10-year recall at [facility], had normal Cologuard 01/16/2015.  Follow Up Instructions: F/u prn in the next few months   [id number] 5-10 [id number] 11-20 9443 21-30 I did not refer this patient for an OV in the next 24 hours for this/these issue(s).  I discussed the assessment and treatment plan with the patient. The patient was provided an opportunity to ask questions and all were answered. The patient agreed with the plan and demonstrated an understanding of the instructions.   The patient was advised to call back or seek an in-person  evaluation if the symptoms worsen or if the condition fails to improve as anticipated.  I provided 24 minutes of non-face-to-face time during this encounter.   Billie Ruddy, MD

## 2022-05-13 ENCOUNTER — Other Ambulatory Visit: Payer: Self-pay | Admitting: Family Medicine

## 2022-05-13 DIAGNOSIS — G609 Hereditary and idiopathic neuropathy, unspecified: Secondary | ICD-10-CM

## 2022-05-15 ENCOUNTER — Ambulatory Visit
Admission: EM | Admit: 2022-05-15 | Discharge: 2022-05-15 | Disposition: A | Discharge status: Home / Self Care | Payer: Medicare Other | Attending: Nurse Practitioner | Admitting: Nurse Practitioner

## 2022-05-15 DIAGNOSIS — J209 Acute bronchitis, unspecified: Secondary | ICD-10-CM

## 2022-05-15 DIAGNOSIS — J0100 Acute maxillary sinusitis, unspecified: Secondary | ICD-10-CM

## 2022-05-15 MED ORDER — AMOXICILLIN-POT CLAVULANATE 875-125 MG PO TABS: 1.0000 | ORAL_TABLET | Freq: Two times a day (BID) | ORAL | 0 refills | Status: DC

## 2022-05-15 MED ORDER — FLUTICASONE PROPIONATE 50 MCG/ACT NA SUSP: 1.0000 | Freq: Every day | NASAL | 2 refills | Status: AC

## 2022-05-15 NOTE — Discharge Instructions (Signed)
Flonase daily Augmentin twice daily for 7 days Over-the-counter Mucinex for cough Rest and fluids Follow-up with your PCP 2 to 3 days for recheck Please go to the ER for any worsening symptoms

## 2022-05-15 NOTE — ED Triage Notes (Signed)
Pt presents with sinus congestion, cough and ha for 2 weeks she has been taking otc robitussin and has finished two bottles and figured she should come in to get checked out.

## 2022-05-15 NOTE — ED Provider Notes (Signed)
[name]    CSN: [id number] Arrival date & time: 05/15/22  8366      History   Chief Complaint Chief Complaint  Patient presents with   Cough   Nasal Congestion    HPI Katherine [name] is a 72 y.o. female presents for evaluation of URI symptoms for 2 weeks. Patient reports associated symptoms of productive cough with brown phlegm, sinus pressure/pain with purulent nasal discharge. Denies N/V/D, fevers, sore throat, ear pain, body aches, shortness of breath. Patient does not have a hx of asthma or smoking. No known sick contacts and no recent travel. Pt is vaccinated for COVID. Pt is vaccinated for flu this season. Pt has taken Robitussin OTC for symptoms. Pt has no other concerns at this time.    Cough   Past Medical History:  Diagnosis Date   Complication of anesthesia     neuropathy in feet flares after surgery from PAS hose. needs cream on feet   DDD (degenerative disc disease), lumbar 2012   Dental crowns present    GERD (gastroesophageal reflux disease)    occ, no medications at this time   History of anemia 1991   not since hysterectomy   History of gross hematuria 2002   with kidney stones   History of hyperparathyroidism    History of kidney stones    Hypertension    states under control with meds., has been on med. x 20 yr.   Impingement syndrome of right shoulder 2017   Lymphedema of both lower extremities    due to neuropathy   Lymphedema of both upper extremities    due to neuropathy   Obese    Osteoarthritis    Peripheral neuropathy    hands and feet   Sciatica    Triangular fibrocartilage complex tear 06/2017   right   Ulnar impaction syndrome, right 06/2017   Wears partial dentures    upper and lower    Patient Active Problem List   Diagnosis Date Noted   Diet-controlled diabetes mellitus ([place]) 03/11/2022   Mixed hyperlipidemia 03/11/2022   Cataract of left eye 03/11/2022   Spinal stenosis, lumbar region with neurogenic  claudication 02/05/2021   Prediabetes 11/06/2019   Peripheral neuropathy 02/09/2019   Chronic right-sided low back pain with right-sided sciatica 02/09/2019   S/P revision of total knee 11/28/2018   S/P total knee replacement 05/02/2018   Body mass index 40.0-44.9, adult ([place]) 10/22/2017   Closed displaced oblique fracture of shaft of ulna with malunion, subsequent encounter 07/28/2017   Ulnar impaction syndrome, right 07/07/2017   Arthritis of right wrist 07/07/2017   Pain in right wrist 06/01/2017   Trigger finger, right index finger 09/28/2016   Impaired glucose tolerance 09/09/2015   Hyperparathyroidism ([place]) 05/26/2013   Hereditary and idiopathic peripheral neuropathy 12/29/2012   Morbid obesity ([place])    Menopausal symptoms 11/10/2011   DIVERTICULOSIS, COLON 11/02/2007   Dyslipidemia 03/09/2006   Essential hypertension 03/09/2006   NEPHROLITHIASIS, HX OF 03/09/2006    Past Surgical History:  Procedure Laterality Date   ABDOMINAL HYSTERECTOMY  1991   partial   CARPAL TUNNEL RELEASE Right 08/09/2002   CARPAL TUNNEL RELEASE Left 11/03/2006   CATARACT EXTRACTION Left 07/21/2021   COLONOSCOPY  01/2016   COLONOSCOPY WITH PROPOFOL  11/24/2011   CYSTOSCOPY WITH RETROGRADE PYELOGRAM, URETEROSCOPY AND STENT PLACEMENT Left 12/18/2014   Procedure: CYSTOSCOPY WITH RETROGRADE PYELOGRAM, URETEROSCOPY AND STENT PLACEMENT left ureter;  Surgeon: Raynelle Bring, MD;  Location: [facility];  Service: Urology;  Laterality: Left;   DILATION AND CURETTAGE OF UTERUS     ELBOW ARTHROSCOPY Right    EXTRACORPOREAL SHOCK WAVE LITHOTRIPSY Left 03/08/2017   Procedure: LEFT EXTRACORPOREAL SHOCK WAVE LITHOTRIPSY (ESWL);  Surgeon: Raynelle Bring, MD;  Location: [facility];  Service: Urology;  Laterality: Left;   HOLMIUM LASER APPLICATION Left 21/03/7355   Procedure: HOLMIUM LASER APPLICATION left ureter ;  Surgeon: Raynelle Bring, MD;  Location: [facility];  Service: Urology;  Laterality: Left;   PARATHYROIDECTOMY  Right 05/03/2015   Procedure: RIGHT SUPERIOR PARATHYROIDECTOMY;  Surgeon: Armandina Gemma, MD;  Location: [facility];  Service: General;  Laterality: Right;   PATELLA REALIGNMENT Bilateral    as a teenager   ROTATOR CUFF REPAIR Right    x2   SHOULDER ARTHROSCOPY Right    x 2   SHOULDER ARTHROSCOPY W/ ROTATOR CUFF REPAIR Left 04/26/2002   SYNOVECTOMY WITH POLY EXCHANGE Left 11/28/2018   Procedure: SYNOVECTOMY WITH Posey Pronto;  Surgeon: Vickey Huger, MD;  Location: [facility];  Service: Orthopedics;  Laterality: Left;   TONSILLECTOMY     TOTAL KNEE ARTHROPLASTY Left 09/10/2003   TOTAL KNEE ARTHROPLASTY Right 11/10/2004   TOTAL KNEE ARTHROPLASTY Right 05/02/2018   Procedure: Right polyethelene exchange and open synovectomy;  Surgeon: Vickey Huger, MD;  Location: [facility];  Service: Orthopedics;  Laterality: Right;   TRIGGER FINGER RELEASE Right 08/06/2008   ring finger   TRIGGER FINGER RELEASE     multiple - right thumb, left index/long/ring/thumb   ULNA OSTEOTOMY Right 07/28/2017   Procedure: REVISION RIGHT ULNAR SHORTENING OSTEOTOMY;  Surgeon: Leandrew Koyanagi, MD;  Location: [place];  Service: Orthopedics;  Laterality: Right;   WRIST ARTHROSCOPY Left 06/16/2000   WRIST ARTHROSCOPY WITH ULNA SHORTENING Right 07/07/2017   Procedure: RIGHT WRIST ARTHROSCOPY WITH ULNAR SHORTENING OSTEOTOMY;  Surgeon: Leandrew Koyanagi, MD;  Location: [facility];  Service: Orthopedics;  Laterality: Right;    OB History     Gravida  1   Para  1   Term      Preterm      AB      Living         SAB      IAB      Ectopic      Multiple      Live Births               Home Medications    Prior to Admission medications   Medication Sig Start Date End Date Taking? Authorizing Provider  amoxicillin-clavulanate (AUGMENTIN) 875-125 MG tablet Take 1 tablet by mouth every 12 (twelve) hours. 05/15/22  Yes Melynda Ripple, NP  fluticasone (FLONASE) 50 MCG/ACT nasal spray Place 1  spray into both nostrils daily. 05/15/22  Yes Melynda Ripple, NP  Cholecalciferol (VITAMIN D) 50 MCG (2000 UT) tablet Take 2,000 Units by mouth daily.     [provider]  Cyanocobalamin (VITAMIN B-12 PO) Take by mouth.    [provider]  cycloSPORINE (RESTASIS) 0.05 % ophthalmic emulsion Place 1 drop into both eyes 2 (two) times daily.    [provider]  hydrochlorothiazide (HYDRODIURIL) 12.5 MG tablet Take 12.5 mg by mouth daily. 11/16/18   [provider]  lidocaine (XYLOCAINE) 5 % ointment Apply 1 application topically 3 (three) times daily as needed. 05/28/21   Billie Ruddy, MD  lidocaine-prilocaine (EMLA) cream Apply 1 application topically to affected areas 3 times daily as needed. 08/14/20   Grier Mitts  R, MD  lisinopril (ZESTRIL) 10 MG tablet TAKE 1 TABLET BY MOUTH  DAILY 11/20/21   Billie Ruddy, MD  Multiple Vitamin (MULTIVITAMIN) tablet Take 1 tablet by mouth daily.    [provider]  nortriptyline (PAMELOR) 25 MG capsule TAKE 3 CAPSULES BY MOUTH AT  BEDTIME 02/02/22   Billie Ruddy, MD  pregabalin (LYRICA) 150 MG capsule TAKE 1 CAPSULE BY MOUTH TWICE  DAILY 05/13/22   Billie Ruddy, MD  traMADol Veatrice [place]) 50 MG tablet Take one to two tablets by mouth every 6 hours if needed for pain, Prescribed by Neurology 02/23/20   Billie Ruddy, MD    Family History Family History  Problem Relation Age of Onset   Stroke Mother    Cancer Mother 68       bone cancer   Other Father        unknown medical history   Diabetes Sister     Social History Social History   Tobacco Use   Smoking status: Never   Smokeless tobacco: Never  Vaping Use   Vaping Use: Never used  Substance Use Topics   Alcohol use: Not Currently   Drug use: Never     Allergies   Erythromycin   Review of Systems Review of Systems  HENT:  Positive for congestion, sinus pressure and sinus pain.   Respiratory:  Positive for cough.      Physical  Exam Triage Vital Signs ED Triage Vitals  Enc Vitals Group     BP 05/15/22 1010 128/73     Pulse Rate 05/15/22 1008 (!) 116     Resp 05/15/22 1008 (!) 22     Temp 05/15/22 1008 98.3 F (36.8 C)     Temp Source 05/15/22 1008 Oral     SpO2 05/15/22 1008 98 %     Weight --      Height --      Head Circumference --      Peak Flow --      Pain Score 05/15/22 1008 0     Pain Loc --      Pain Edu? --      Excl. in [place]? --    No data found.  Updated Vital Signs BP 128/73   Pulse (!) 116   Temp 98.3 F (36.8 C) (Oral)   Resp (!) 22   SpO2 98%   Visual Acuity Right Eye Distance:   Left Eye Distance:   Bilateral Distance:    Right Eye Near:   Left Eye Near:    Bilateral Near:     Physical Exam Vitals and nursing note reviewed.  Constitutional:      General: She is not in acute distress.    Appearance: She is well-developed. She is not ill-appearing.  HENT:     Head: Normocephalic and atraumatic.     Right Ear: Tympanic membrane and ear canal normal.     Left Ear: Tympanic membrane and ear canal normal.     Nose: Congestion present.     Right Turbinates: Swollen and pale.     Left Turbinates: Swollen and pale.     Right Sinus: Maxillary sinus tenderness present. No frontal sinus tenderness.     Left Sinus: Maxillary sinus tenderness present. No frontal sinus tenderness.     Mouth/Throat:     Mouth: Mucous membranes are moist.     Pharynx: Oropharynx is clear. Uvula midline. No oropharyngeal exudate or posterior oropharyngeal erythema.  Tonsils: No tonsillar exudate or tonsillar abscesses.  Eyes:     Conjunctiva/sclera: Conjunctivae normal.     Pupils: Pupils are equal, round, and reactive to light.  Cardiovascular:     Rate and Rhythm: Normal rate and regular rhythm.     Heart sounds: Normal heart sounds.  Pulmonary:     Effort: Pulmonary effort is normal.     Breath sounds: Normal breath sounds.  Musculoskeletal:     Cervical back: Normal range of motion and  neck supple.  Lymphadenopathy:     Cervical: No cervical adenopathy.  Skin:    General: Skin is warm and dry.  Neurological:     General: No focal deficit present.     Mental Status: She is alert and oriented to person, place, and time.  Psychiatric:        Mood and Affect: Mood normal.        Behavior: Behavior normal.      UC Treatments / Results  Labs (all labs ordered are listed, but only abnormal results are displayed) Labs Reviewed - No data to display  EKG   Radiology No results found.  Procedures Procedures (including critical care time)  Medications Ordered in UC Medications - No data to display  Initial Impression / Assessment and Plan / UC Course  I have reviewed the triage vital signs and the nursing notes.  Pertinent labs & imaging results that were available during my care of the patient were reviewed by me and considered in my medical decision making (see chart for details).     Patient in stable condition with no red flags on exam Reviewed exam and sx with patient Start Augmentin Flonase daily Avoid steroids at this time due to history of diabetes Pt verbalized understanding of instruction and all questions answered F/U with PCP in 2-3 days for re-check Strict ER precautions reviewed and patient verbalized understanding Pt was discharged in stable condition and in NAD  Final Clinical Impressions(s) / UC Diagnoses   Final diagnoses:  Acute maxillary sinusitis, recurrence not specified  Acute bronchitis, unspecified organism     Discharge Instructions      Flonase daily Augmentin twice daily for 7 days Over-the-counter Mucinex for cough Rest and fluids Follow-up with your PCP 2 to 3 days for recheck Please go to the ER for any worsening symptoms    ED Prescriptions     Medication Sig Dispense Auth. Provider   fluticasone (FLONASE) 50 MCG/ACT nasal spray Place 1 spray into both nostrils daily. 15.8 mL Melynda Ripple, NP    amoxicillin-clavulanate (AUGMENTIN) 875-125 MG tablet Take 1 tablet by mouth every 12 (twelve) hours. 14 tablet Melynda Ripple, NP      PDMP not reviewed this encounter.   Melynda Ripple, NP 05/15/22 1025

## 2022-06-09 ENCOUNTER — Other Ambulatory Visit: Payer: Self-pay | Admitting: Family Medicine

## 2022-06-09 ENCOUNTER — Ambulatory Visit (AMBULATORY_SURGERY_CENTER): Payer: Medicare Other | Admitting: *Deleted

## 2022-06-09 VITALS — Ht 64.5 in | Wt 244.0 lb

## 2022-06-09 DIAGNOSIS — Z1211 Encounter for screening for malignant neoplasm of colon: Secondary | ICD-10-CM

## 2022-06-09 MED ORDER — NA SULFATE-K SULFATE-MG SULF 17.5-3.13-1.6 GM/177ML PO SOLN: 1.0000 | Freq: Once | ORAL | 0 refills | Status: AC

## 2022-06-09 NOTE — Progress Notes (Signed)
No egg or soy allergy known to patient  No issues known to pt with past sedation with any surgeries or procedures Patient denies ever being told they had issues or difficulty with intubation  No FH of Malignant Hyperthermia Pt is not on diet pills Pt is not on home 02  Pt is not on blood thinners  Pt denies issues with constipation  No A fib or A flutter Have any cardiac testing pending--NO Pt instructed to use [email or web address] or GoodRx for a price reduction on prep   

## 2022-06-12 ENCOUNTER — Ambulatory Visit (INDEPENDENT_AMBULATORY_CARE_PROVIDER_SITE_OTHER): Payer: Medicare Other | Admitting: Family Medicine

## 2022-06-15 ENCOUNTER — Encounter: Payer: Self-pay | Admitting: Gastroenterology

## 2022-06-15 ENCOUNTER — Encounter: Payer: Self-pay | Admitting: Family Medicine

## 2022-06-15 ENCOUNTER — Ambulatory Visit (INDEPENDENT_AMBULATORY_CARE_PROVIDER_SITE_OTHER): Payer: Medicare Other | Admitting: Family Medicine

## 2022-06-15 VITALS — BP 104/80 | HR 102 | Temp 98.5°F | Ht 64.5 in | Wt 251.6 lb

## 2022-06-15 DIAGNOSIS — F4321 Adjustment disorder with depressed mood: Secondary | ICD-10-CM

## 2022-06-15 DIAGNOSIS — E782 Mixed hyperlipidemia: Secondary | ICD-10-CM | POA: Diagnosis not present

## 2022-06-15 DIAGNOSIS — R0982 Postnasal drip: Secondary | ICD-10-CM

## 2022-06-15 DIAGNOSIS — E213 Hyperparathyroidism, unspecified: Secondary | ICD-10-CM | POA: Diagnosis not present

## 2022-06-15 DIAGNOSIS — M791 Myalgia, unspecified site: Secondary | ICD-10-CM | POA: Diagnosis not present

## 2022-06-15 DIAGNOSIS — G609 Hereditary and idiopathic neuropathy, unspecified: Secondary | ICD-10-CM

## 2022-06-15 DIAGNOSIS — E119 Type 2 diabetes mellitus without complications: Secondary | ICD-10-CM | POA: Diagnosis not present

## 2022-06-15 LAB — CBC WITH DIFFERENTIAL/PLATELET
Basophils Absolute: 0 10*3/uL (ref 0.0–0.1)
Basophils Relative: 0.5 % (ref 0.0–3.0)
Eosinophils Absolute: 0.2 10*3/uL (ref 0.0–0.7)
Eosinophils Relative: 3.7 % (ref 0.0–5.0)
HCT: 39.1 % (ref 36.0–46.0)
Hemoglobin: 13.6 g/dL (ref 12.0–15.0)
Lymphocytes Relative: 51.5 % — ABNORMAL HIGH (ref 12.0–46.0)
Lymphs Abs: 2.1 10*3/uL (ref 0.7–4.0)
MCHC: 34.9 g/dL (ref 30.0–36.0)
MCV: 86.8 fl (ref 78.0–100.0)
Monocytes Absolute: 0.6 10*3/uL (ref 0.1–1.0)
Monocytes Relative: 13.8 % — ABNORMAL HIGH (ref 3.0–12.0)
Neutro Abs: 1.3 10*3/uL — ABNORMAL LOW (ref 1.4–7.7)
Neutrophils Relative %: 30.5 % — ABNORMAL LOW (ref 43.0–77.0)
Platelets: 164 10*3/uL (ref 150.0–400.0)
RBC: 4.51 Mil/uL (ref 3.87–5.11)
RDW: 14.3 % (ref 11.5–15.5)
WBC: 4.2 10*3/uL (ref 4.0–10.5)

## 2022-06-15 LAB — COMPREHENSIVE METABOLIC PANEL
ALT: 21 U/L (ref 0–35)
AST: 43 U/L — ABNORMAL HIGH (ref 0–37)
Albumin: 4.1 g/dL (ref 3.5–5.2)
Alkaline Phosphatase: 52 U/L (ref 39–117)
BUN: 14 mg/dL (ref 6–23)
CO2: 30 mEq/L (ref 19–32)
Calcium: 10.3 mg/dL (ref 8.4–10.5)
Chloride: 101 mEq/L (ref 96–112)
Creatinine, Ser: 0.89 mg/dL (ref 0.40–1.20)
GFR: 64.43 mL/min (ref >60.00)
Glucose, Bld: 103 mg/dL — ABNORMAL HIGH (ref 70–99)
Potassium: 4 mEq/L (ref 3.5–5.1)
Sodium: 139 mEq/L (ref 135–145)
Total Bilirubin: 0.6 mg/dL (ref 0.2–1.2)
Total Protein: 7.4 g/dL (ref 6.0–8.3)

## 2022-06-15 LAB — POCT GLYCOSYLATED HEMOGLOBIN (HGB A1C): Hemoglobin A1C: 6.2 % — AB (ref 4.0–5.6)

## 2022-06-15 LAB — MICROALBUMIN / CREATININE URINE RATIO
Creatinine,U: 50.6 mg/dL
Microalb Creat Ratio: 5.6 mg/g (ref 0.0–30.0)
Microalb, Ur: 2.8 mg/dL — ABNORMAL HIGH (ref 0.0–1.9)

## 2022-06-15 MED ORDER — FEXOFENADINE HCL 60 MG PO TABS: 60.0000 mg | ORAL_TABLET | Freq: Every day | ORAL | 1 refills | Status: AC

## 2022-06-15 NOTE — Progress Notes (Signed)
Established Patient Office Visit   Subjective  Patient ID: Katherine [name], female    DOB: 09-14-1950  Age: 72 y.o. MRN: [id number]  Chief Complaint  Patient presents with   Medical Management of Chronic Issues    Follow up on DM.     Pt seen for f/u.  Still having numbness and tingling in b/l Les up to mid shins.  Using Emla cream daily.  Endorses edema in b/l feet and ankles with sitting.  Pt elevating legs when sitting.   Having R shoulder pain.  May have reinjured R rotator cuff, but does not recall injury.  Has not restarted swimming but plans to start.  Pt endorses decreased motivation/feeling depressed since the death of her twin brother who had colon cancer.  PT has colonoscopy scheduled 06/24/22.    Patient also having cramping in bilateral thighs   Pt with productive cough, nasal congestion.  Seen at [facility] for acute bronchitis 3 wks ago.  Endorses post nasal drainage worse at night.     ROS Negative unless stated above    Objective:     BP 104/80 (BP Location: Right Arm, Patient Position: Sitting, Cuff Size: Large)   Pulse (!) 102   Temp 98.5 F (36.9 C) (Oral)   Ht 5' 4.5" (1.638 m)   Wt 251 lb 9.6 oz (114.1 kg)   SpO2 98%   BMI 42.52 kg/m    Physical Exam Constitutional:      General: She is not in acute distress.    Appearance: Normal appearance.  HENT:     Head: Normocephalic and atraumatic.     Nose: Nose normal.     Mouth/Throat:     Mouth: Mucous membranes are moist.  Cardiovascular:     Rate and Rhythm: Normal rate and regular rhythm.     Heart sounds: Normal heart sounds. No murmur heard.    No gallop.  Pulmonary:     Effort: Pulmonary effort is normal. No respiratory distress.     Breath sounds: Normal breath sounds. No wheezing, rhonchi or rales.  Musculoskeletal:        General: Tenderness present. No swelling.  Skin:    General: Skin is warm and dry.  Neurological:     Mental Status: She is alert and oriented to person, place, and  time.      Results for orders placed or performed in visit on 06/15/22  POC HgB A1c  Result Value Ref Range   Hemoglobin A1C 6.2 (A) 4.0 - 5.6 %   HbA1c POC (<> result, manual entry)     HbA1c, POC (prediabetic range)     HbA1c, POC (controlled diabetic range)     Diabetic Foot Exam - Simple   Simple Foot Form Diabetic Foot exam was performed with the following findings: Yes 06/15/2022 10:22 AM  Visual Inspection See comments: Yes Sensation Testing See comments: Yes Pulse Check See comments: Yes Comments Decreased PT and DP pulses bilaterally, 1+.  Feet warm.  Erythema of bilateral medial great toes at MTP joint.  Monofilament and vibratory sense not intact bilaterally.  Able to feel monofilament on dorsum of feet near distal ankles.       06/15/2022    9:35 AM 01/13/2022   11:43 AM 11/20/2021   10:43 AM  Depression screen PHQ 2/9  Decreased Interest 1 0 1  Down, Depressed, Hopeless 1 0 0  PHQ - 2 Score 2 0 1  Altered sleeping 0  0  Tired, decreased energy  1  2  Change in appetite 0  1  Feeling bad or failure about yourself  0  0  Trouble concentrating 0  1  Moving slowly or fidgety/restless 0  0  Suicidal thoughts 0  0  PHQ-9 Score 3  5  Difficult doing work/chores Not difficult at all  Not difficult at all      06/15/2022    9:35 AM  GAD 7 : Generalized Anxiety Score  Nervous, Anxious, on Edge 0  Control/stop worrying 0  Worry too much - different things 0  Trouble relaxing 0  Restless 0  Easily annoyed or irritable 0  Afraid - awful might happen 0  Total GAD 7 Score 0  Anxiety Difficulty Not difficult at all        Assessment & Plan:  Diet-controlled diabetes mellitus (HCC) -hgb A1C 6.2% this visit -continue lifestyle modifications -not currently on meds -foot exam done this visit -given rx for diabetic shoes -continue ACE-I -     POCT glycosylated hemoglobin (Hb A1C) -     Comprehensive metabolic panel -     For Home Use Only DME Diabetic Shoe -      Microalbumin / creatinine urine ratio  Mixed hyperlipidemia -diet changes  Myalgia -     Comprehensive metabolic panel -     Parathyroid hormone, intact (no Ca)  Hereditary and idiopathic peripheral neuropathy -continue EMLA cream, lyrica 150 mg BID, stretching, and swimming. -     Comprehensive metabolic panel -     CBC with Differential/Platelet -     For Home Use Only DME Diabetic Shoe  Hyperparathyroidism ([place]) -     Comprehensive metabolic panel -     Parathyroid hormone, intact (no Ca)  Post-nasal drainage -likely 2/2 allergies. -OTC antihistamine prn -given rx for allegra -     Fexofenadine HCl; Take 1 tablet (60 mg total) by mouth daily.  Dispense: 30 tablet; Refill: 1  Grief -PHQ 9 score 3.  GAD 7 score 0 -consider counseling -continue to monitor   Return in about 3 months (around 09/13/2022), or if symptoms worsen or fail to improve.   Billie Ruddy, MD

## 2022-06-16 LAB — PARATHYROID HORMONE, INTACT (NO CA): PTH: 40 pg/mL (ref 16–77)

## 2022-06-21 ENCOUNTER — Encounter: Payer: Self-pay | Admitting: Certified Registered Nurse Anesthetist

## 2022-06-24 ENCOUNTER — Encounter: Payer: Self-pay | Admitting: Gastroenterology

## 2022-06-24 ENCOUNTER — Ambulatory Visit (AMBULATORY_SURGERY_CENTER): Payer: Medicare Other | Admitting: Gastroenterology

## 2022-06-24 VITALS — BP 94/59 | HR 111 | Temp 96.6°F | Resp 17 | Ht 64.5 in | Wt 244.0 lb

## 2022-06-24 DIAGNOSIS — Z1211 Encounter for screening for malignant neoplasm of colon: Secondary | ICD-10-CM

## 2022-06-24 DIAGNOSIS — D124 Benign neoplasm of descending colon: Secondary | ICD-10-CM

## 2022-06-24 MED ORDER — SODIUM CHLORIDE 0.9 % IV SOLN: 500.0000 mL | INTRAVENOUS | Status: DC

## 2022-06-24 NOTE — Progress Notes (Signed)
0842 HR > 100 with esmolol 25 mg given IV, MD updated, vss

## 2022-06-24 NOTE — Patient Instructions (Signed)
Please read handouts provided. Continue present medications. Await pathology results.   YOU HAD AN ENDOSCOPIC PROCEDURE TODAY AT THE [facility]:   Refer to the procedure report that was given to you for any specific questions about what was found during the examination.  If the procedure report does not answer your questions, please call your gastroenterologist to clarify.  If you requested that your care partner not be given the details of your procedure findings, then the procedure report has been included in a sealed envelope for you to review at your convenience later.  YOU SHOULD EXPECT: Some feelings of bloating in the abdomen. Passage of more gas than usual.  Walking can help get rid of the air that was put into your GI tract during the procedure and reduce the bloating. If you had a lower endoscopy (such as a colonoscopy or flexible sigmoidoscopy) you may notice spotting of blood in your stool or on the toilet paper. If you underwent a bowel prep for your procedure, you may not have a normal bowel movement for a few days.  Please Note:  You might notice some irritation and congestion in your nose or some drainage.  This is from the oxygen used during your procedure.  There is no need for concern and it should clear up in a day or so.  SYMPTOMS TO REPORT IMMEDIATELY:  Following lower endoscopy (colonoscopy or flexible sigmoidoscopy):  Excessive amounts of blood in the stool  Significant tenderness or worsening of abdominal pains  Swelling of the abdomen that is new, acute  Fever of 100F or higher  For urgent or emergent issues, a gastroenterologist can be reached at any hour by calling [phone]. Do not use MyChart messaging for urgent concerns.    DIET:  We do recommend a small meal at first, but then you may proceed to your regular diet.  Drink plenty of fluids but you should avoid alcoholic beverages for 24 hours.  ACTIVITY:  You should plan to take it easy for  the rest of today and you should NOT DRIVE or use heavy machinery until tomorrow (because of the sedation medicines used during the test).    FOLLOW UP: Our staff will call the number listed on your records the next business day following your procedure.  We will call around 7:15- 8:00 am to check on you and address any questions or concerns that you may have regarding the information given to you following your procedure. If we do not reach you, we will leave a message.     If any biopsies were taken you will be contacted by phone or by letter within the next 1-3 weeks.  Please call us at [phone] if you have not heard about the biopsies in 3 weeks.    SIGNATURES/CONFIDENTIALITY: You and/or your care partner have signed paperwork which will be entered into your electronic medical record.  These signatures attest to the fact that that the information above on your After Visit Summary has been reviewed and is understood.  Full responsibility of the confidentiality of this discharge information lies with you and/or your care-partner. 

## 2022-06-24 NOTE — Progress Notes (Signed)
Report given to PACU, vss 

## 2022-06-24 NOTE — Progress Notes (Signed)
See 06/15/2022 H&P, no changes.

## 2022-06-24 NOTE — Progress Notes (Signed)
Patient reports no changes to health or medications since pre visit.

## 2022-06-24 NOTE — Progress Notes (Signed)
Called to room to assist during endoscopic procedure.  Patient ID and intended procedure confirmed with present staff. Received instructions for my participation in the procedure from the performing physician.  

## 2022-06-24 NOTE — Op Note (Signed)
[place] Patient Name: Katherine [name] Procedure Date: 06/24/2022 8:40 AM MRN: [id number] Endoscopist: Ladene Artist , MD, [id number] Age: 73 Referring MD:  Date of Birth: 04-14-1951 Gender: Female Account #: [id number] Procedure:                Colonoscopy Indications:              Screening for colorectal malignant neoplasm Medicines:                Monitored Anesthesia Care Procedure:                Pre-Anesthesia Assessment:                           - Prior to the procedure, a History and Physical                            was performed, and patient medications and                            allergies were reviewed. The patient's tolerance of                            previous anesthesia was also reviewed. The risks                            and benefits of the procedure and the sedation                            options and risks were discussed with the patient.                            All questions were answered, and informed consent                            was obtained. Prior Anticoagulants: The patient has                            taken no anticoagulant or antiplatelet agents. ASA                            Grade Assessment: III - A patient with severe                            systemic disease. After reviewing the risks and                            benefits, the patient was deemed in satisfactory                            condition to undergo the procedure.                           After obtaining informed consent, the colonoscope  was passed under direct vision. Throughout the                            procedure, the patient's blood pressure, pulse, and                            oxygen saturations were monitored continuously. The                            Olympus [id number] SN [id number] was introduced through                            the anus and advanced to the the cecum, identified                            by  appendiceal orifice and ileocecal valve. The                            ileocecal valve, appendiceal orifice, and rectum                            were photographed. The quality of the bowel                            preparation was adequate. The colonoscopy was                            performed without difficulty. The patient tolerated                            the procedure well. Scope In: 8:44:35 AM Scope Out: 8:58:48 AM Scope Withdrawal Time: 0 hours 9 minutes 48 seconds  Total Procedure Duration: 0 hours 14 minutes 13 seconds  Findings:                 The perianal and digital rectal examinations were                            normal.                           A 5 mm polyp was found in the descending colon. The                            polyp was sessile. The polyp was removed with a                            cold snare. Resection and retrieval were complete.                           Internal hemorrhoids were found during                            retroflexion. The hemorrhoids were small and Grade  I (internal hemorrhoids that do not prolapse).                           The exam was otherwise without abnormality on                            direct and retroflexion views. Complications:            No immediate complications. Estimated blood loss:                            None. Estimated Blood Loss:     Estimated blood loss: none. Impression:               - One 5 mm polyp in the descending colon, removed                            with a cold snare. Resected and retrieved.                           - Internal hemorrhoids.                           - The examination was otherwise normal on direct                            and retroflexion views. Recommendation:           - Repeat colonoscopy vs no repeat due to age after                            studies are complete for surveillance based on                            pathology results.                            - Patient has a contact number available for                            emergencies. The signs and symptoms of potential                            delayed complications were discussed with the                            patient. Return to normal activities tomorrow.                            Written discharge instructions were provided to the                            patient.                           - Resume previous diet.                           -  Continue present medications.                           - Await pathology results. Ladene Artist, MD 06/24/2022 9:01:34 AM This report has been signed electronically.

## 2022-06-25 ENCOUNTER — Telehealth: Payer: Self-pay

## 2022-06-25 NOTE — Telephone Encounter (Signed)
  Follow up Call-     06/24/2022    8:04 AM  Call back number  Post procedure Call Back phone  # [phone]  Permission to leave phone message Yes     Patient questions:  Do you have a fever, pain , or abdominal swelling? No. Pain Score  0 *  Have you tolerated food without any problems? Yes.    Have you been able to return to your normal activities? Yes.    Do you have any questions about your discharge instructions: Diet   No. Medications  No. Follow up visit  No.  Do you have questions or concerns about your Care? No.  Actions: * If pain score is 4 or above: No action needed, pain <4.

## 2022-07-01 ENCOUNTER — Ambulatory Visit (INDEPENDENT_AMBULATORY_CARE_PROVIDER_SITE_OTHER): Payer: Medicare Other

## 2022-07-01 ENCOUNTER — Encounter: Payer: Self-pay | Admitting: Orthopaedic Surgery

## 2022-07-01 ENCOUNTER — Ambulatory Visit: Payer: Self-pay

## 2022-07-01 ENCOUNTER — Ambulatory Visit (INDEPENDENT_AMBULATORY_CARE_PROVIDER_SITE_OTHER): Payer: Medicare Other | Admitting: Orthopaedic Surgery

## 2022-07-01 ENCOUNTER — Ambulatory Visit (INDEPENDENT_AMBULATORY_CARE_PROVIDER_SITE_OTHER): Payer: Medicare Other | Admitting: Sports Medicine

## 2022-07-01 DIAGNOSIS — G8929 Other chronic pain: Secondary | ICD-10-CM

## 2022-07-01 DIAGNOSIS — M25511 Pain in right shoulder: Secondary | ICD-10-CM

## 2022-07-01 MED ORDER — METHYLPREDNISOLONE ACETATE 40 MG/ML IJ SUSP
40.0000 mg | INTRAMUSCULAR | Status: AC | PRN
  Administered 2022-07-01: 40 mg via INTRA_ARTICULAR

## 2022-07-01 MED ORDER — LIDOCAINE HCL 1 % IJ SOLN
0.5000 mL | INTRAMUSCULAR | Status: AC | PRN
  Administered 2022-07-01: .5 mL

## 2022-07-01 NOTE — Progress Notes (Signed)
Office Visit Note   Patient: Katherine [name]           Date of Birth: 1951/04/30           MRN: [id number] Visit Date: 07/01/2022              Requested by: Billie Ruddy, MD [place],  [place] [id number] PCP: Billie Ruddy, MD   Assessment & Plan: Visit Diagnoses:  1. 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  The patient seems to be most symptomatic from her [facility] joint today.  We have referred her to Dr. Rolena Infante for ultrasound-guided cortisone injection to the [facility] joint.  She will follow-up with [place] as needed.  Follow-Up Instructions: Return if symptoms worsen or fail to improve.   Orders:  Orders Placed This Encounter  Procedures   XR Shoulder Right   No orders of the defined types were placed in this encounter.     Procedures: No procedures performed   Clinical Data: No additional findings.   Subjective: Chief Complaint  Patient presents with   Right Shoulder - Pain    HPI patient is a pleasant 72 year old female who comes in today with right shoulder pain.  Symptoms been ongoing for the past few months.  She notes a remote history of rotator cuff repair x 2 first 1 being by Dr. Luciana Axe take years ago and the second 1 being by Dr. Lorre Nick.  Doing well after the last rotator cuff repair until recently.  No new injury.  Majority of her pain is to the top of the shoulder at the [facility] joint but does have some pain radiating into the deltoid.  Using her arm in any direction seems to aggravate her symptoms.  She does not take any medication for this.  Review of Systems as detailed in HPI.  All others reviewed and are negative.   Objective: Vital Signs: There were no vitals taken for this visit.  Physical Exam well-developed well-nourished female no acute distress.  Alert and oriented x 3.  Ortho Exam right shoulder exam reveals forward flexion to about 130 degrees.  Abduction to about 100  degrees.  Internal rotation to her back pocket.  External rotation to about 45 degrees.  She does have marked tenderness to the [facility] joint.  Minimal pain with empty can test.  Negative speeds and negative O'Brien's.  Near full strength throughout.  She is neurovascular intact distally.  Specialty Comments:  No specialty comments available.  Imaging: XR Shoulder Right  Result Date: 07/01/2022 X-rays demonstrate marked degenerative changes to the glenohumeral joint and AC joint.  No superior migration of the humeral head.    PMFS History: Patient Active Problem List   Diagnosis Date Noted   Diet-controlled diabetes mellitus ([place]) 03/11/2022   Mixed hyperlipidemia 03/11/2022   Cataract of left eye 03/11/2022   Spinal stenosis, lumbar region with neurogenic claudication 02/05/2021   Prediabetes 11/06/2019   Peripheral neuropathy 02/09/2019   Chronic right-sided low back pain with right-sided sciatica 02/09/2019   S/P revision of total knee 11/28/2018   S/P total knee replacement 05/02/2018   Body mass index 40.0-44.9, adult ([place]) 10/22/2017   Closed displaced oblique fracture of shaft of ulna with malunion, subsequent encounter 07/28/2017   Ulnar impaction syndrome, right 07/07/2017   Arthritis of right wrist 07/07/2017   Pain in right wrist 06/01/2017   Trigger finger, right index finger 09/28/2016  Impaired glucose tolerance 09/09/2015   Hyperparathyroidism ([place]) 05/26/2013   Hereditary and idiopathic peripheral neuropathy 12/29/2012   Morbid obesity ([place])    Menopausal symptoms 11/10/2011   DIVERTICULOSIS, COLON 11/02/2007   Dyslipidemia 03/09/2006   Essential hypertension 03/09/2006   NEPHROLITHIASIS, HX OF 03/09/2006   Past Medical History:  Diagnosis Date   Cataract    LEFT,REMOVED   Complication of anesthesia     neuropathy in feet flares after surgery from PAS hose. needs cream on feet   DDD (degenerative disc disease), lumbar 2012   Dental crowns present     Depression    "LITTLE BIT"   GERD (gastroesophageal reflux disease)    occ, no medications at this time   History of anemia 1991   not since hysterectomy   History of gross hematuria 2002   with kidney stones   History of hyperparathyroidism    History of kidney stones    Hyperlipidemia    Hypertension    states under control with meds., has been on med. x 20 yr.   Impingement syndrome of right shoulder 2017   Lymphedema of both lower extremities    due to neuropathy   Lymphedema of both upper extremities    due to neuropathy   Obese    Osteoarthritis    Peripheral neuropathy    hands and feet   Sciatica    Triangular fibrocartilage complex tear 06/2017   right   Ulnar impaction syndrome, right 06/2017   Wears partial dentures    upper and lower    Family History  Problem Relation Age of Onset   Stroke Mother    Cancer Mother 1       bone cancer   Other Father        unknown medical history   Diabetes Sister    Colon cancer Brother    Liver cancer Brother    Colon polyps Child    Crohn's disease Neg Hx    Esophageal cancer Neg Hx    Rectal cancer Neg Hx    Stomach cancer Neg Hx    Ulcerative colitis Neg Hx     Past Surgical History:  Procedure Laterality Date   ABDOMINAL HYSTERECTOMY  1991   partial   CARPAL TUNNEL RELEASE Right 08/09/2002   CARPAL TUNNEL RELEASE Left 11/03/2006   CATARACT EXTRACTION Left 07/21/2021   COLONOSCOPY  01/2016   COLONOSCOPY WITH PROPOFOL  11/24/2011   CYSTOSCOPY WITH RETROGRADE PYELOGRAM, URETEROSCOPY AND STENT PLACEMENT Left 12/18/2014   Procedure: CYSTOSCOPY WITH RETROGRADE PYELOGRAM, URETEROSCOPY AND STENT PLACEMENT left ureter;  Surgeon: Raynelle Bring, MD;  Location: [facility];  Service: Urology;  Laterality: Left;   DILATION AND CURETTAGE OF UTERUS     ELBOW ARTHROSCOPY Right    EXTRACORPOREAL SHOCK WAVE LITHOTRIPSY Left 03/08/2017   Procedure: LEFT EXTRACORPOREAL SHOCK WAVE LITHOTRIPSY (ESWL);  Surgeon: Raynelle Bring, MD;   [facility];  Service: Urology;  Laterality: Left;   HOLMIUM LASER APPLICATION Left [id number]   Procedure: HOLMIUM LASER APPLICATION left ureter ;  Surgeon: Raynelle Bring, MD;  [facility];  Service: Urology;  Laterality: Left;   PARATHYROIDECTOMY Right 05/03/2015   Procedure: RIGHT SUPERIOR PARATHYROIDECTOMY;  Surgeon: Armandina Gemma, MD;  Location: [facility];  Service: General;  Laterality: Right;   PATELLA REALIGNMENT Bilateral    as a teenager   ROTATOR CUFF REPAIR Right    x2   SHOULDER ARTHROSCOPY Right    x 2   SHOULDER ARTHROSCOPY W/ ROTATOR CUFF  REPAIR Left 04/26/2002   SYNOVECTOMY WITH POLY EXCHANGE Left 11/28/2018   Procedure: SYNOVECTOMY WITH Posey Pronto;  Surgeon: Vickey Huger, MD;  [facility];  Service: Orthopedics;  Laterality: Left;   TONSILLECTOMY     TOTAL KNEE ARTHROPLASTY Left 09/10/2003   TOTAL KNEE ARTHROPLASTY Right 11/10/2004   TOTAL KNEE ARTHROPLASTY Right 05/02/2018   Procedure: Right polyethelene exchange and open synovectomy;  Surgeon: Vickey Huger, MD;  [facility];  Service: Orthopedics;  Laterality: Right;   TRIGGER FINGER RELEASE Right 08/06/2008   ring finger   TRIGGER FINGER RELEASE     multiple - right thumb, left index/long/ring/thumb   ULNA OSTEOTOMY Right 07/28/2017   Procedure: REVISION RIGHT ULNAR SHORTENING OSTEOTOMY;  Surgeon: Leandrew Koyanagi, MD;  Location: [facility];  Service: Orthopedics;  Laterality: Right;   WRIST ARTHROSCOPY Left 06/16/2000   WRIST ARTHROSCOPY WITH ULNA SHORTENING Right 07/07/2017   Procedure: RIGHT WRIST ARTHROSCOPY WITH ULNAR SHORTENING OSTEOTOMY;  Surgeon: Leandrew Koyanagi, MD;  Location: [facility];  Service: Orthopedics;  Laterality: Right;   Social History   Occupational History   Occupation: Retired in 2011    Employer: [employer]  Tobacco Use   Smoking status: Never   Smokeless tobacco: Never  Vaping Use   Vaping Use: Never used  Substance and Sexual Activity    Alcohol use: Not Currently   Drug use: Never   Sexual activity: Never    Birth control/protection: Surgical

## 2022-07-01 NOTE — Progress Notes (Signed)
   Procedure Note  Patient: Katherine [name]             Date of Birth: November 04, 1950           MRN: [id number]             Visit Date: 07/01/2022  Procedures: Visit Diagnoses:  1. Pain in right acromioclavicular joint   2. Chronic right shoulder pain    Medium Joint Inj: R acromioclavicular on 07/01/2022 11:27 AM Indications: pain Details: 25 G 1.5 in needle, ultrasound-guided anterior approach Medications: 0.5 mL lidocaine 1 %; 40 mg methylPREDNISolone acetate 40 MG/ML  US-guided AC Joint injection, right shoulder After discussion on risks/benefits/indications, informed verbal consent was obtained. A timeout was then performed. The patient was seated in examination room. The area overlying the [facility] joint of the shoulder was prepped with chloraprep and alcohol swabs. The superficial soft tissue was anesthesized with 2cc of lidocaine 1%.  Then utilizing ultrasound guidance, patient's AC joint was injected using a 25G, 1.5" needle with 0.5:1.55m lidocaine:depomedrol of injectate via an in-plane approach from a lateral to medial direction. Visualization of injectate flow was noted under ultrasound guidance. Patient tolerated the procedure well without immediate complications.   Procedure, treatment alternatives, risks and benefits explained, specific risks discussed. Consent was given by the patient. Immediately prior to procedure a time out was called to verify the correct patient, procedure, equipment, support staff and site/side marked as required. Patient was prepped and draped in the usual sterile fashion.    - I evaluated the patient about 10 minutes post-injection and she had excellent improvement in pain following - "feels numb, no pain" - follow-up with Dr. XErlinda Hongand LMendel Ryderas indicated; I am happy to see them as needed  DElba Barman DO [place] This note was dictated using Dragon naturally speaking software and may  contain errors in syntax, spelling, or content which have not been identified prior to signing this note.

## 2022-07-02 ENCOUNTER — Encounter: Payer: Self-pay | Admitting: Gastroenterology

## 2022-07-29 NOTE — Progress Notes (Signed)
Erroneous encounter

## 2022-08-10 ENCOUNTER — Telehealth: Payer: Self-pay | Admitting: Family Medicine

## 2022-08-10 NOTE — Telephone Encounter (Signed)
Handicap Placard form to be filled out--placed in dr's folder.  Call pt for pick up of form upon completion.

## 2022-08-12 ENCOUNTER — Encounter: Payer: Self-pay | Admitting: Family Medicine

## 2022-08-12 ENCOUNTER — Telehealth (INDEPENDENT_AMBULATORY_CARE_PROVIDER_SITE_OTHER): Payer: Medicare Other | Admitting: Family Medicine

## 2022-08-12 DIAGNOSIS — E119 Type 2 diabetes mellitus without complications: Secondary | ICD-10-CM

## 2022-08-12 DIAGNOSIS — M48062 Spinal stenosis, lumbar region with neurogenic claudication: Secondary | ICD-10-CM

## 2022-08-12 DIAGNOSIS — G609 Hereditary and idiopathic neuropathy, unspecified: Secondary | ICD-10-CM | POA: Diagnosis not present

## 2022-08-12 DIAGNOSIS — M25511 Pain in right shoulder: Secondary | ICD-10-CM

## 2022-08-12 DIAGNOSIS — G8929 Other chronic pain: Secondary | ICD-10-CM

## 2022-08-12 NOTE — Progress Notes (Signed)
Virtual Visit via Video Note  I connected with Katherine [name] on 08/12/22 at 11:00 AM EDT by a video enabled telemedicine application and verified that I am speaking with the correct person using two identifiers.  Location patient: home Location provider:work or home office Persons participating in the virtual visit: patient, provider  I discussed the limitations of evaluation and management by telemedicine and the availability of in person appointments. The patient expressed understanding and agreed to proceed.  Chief Complaint  Patient presents with   Back Pain    Complains of lower back pain getting worse    Shoulder Pain    Complains of right shoulder pain     HPI: Patient is a 72 year old female with pmh sig for hyperparathyroidism, diet-controlled diabetes, HTN, peripheral neuropathy, arthritis, HLD, history of renal calculi, obesity, chronic low back pain with sciatica, who is seen for follow-up.    Pt unable to stand straight due to increased pain in back.  Pt leaned over when ambulating with walker or sitting.  Given increased pain has decided to have surgery.  Has not been scheduled yet.    Had steroid injection in R shoulder.  Still having pain.  Told would have to get R shoulder replaced.    Has decided to get back surgery first, then shoulder.  Doing water aerobics.  Feels good in the pool but when has to get out pain starts again.   ROS: See pertinent positives and negatives per HPI.  Past Medical History:  Diagnosis Date   Cataract    LEFT,REMOVED   Complication of anesthesia     neuropathy in feet flares after surgery from PAS hose. needs cream on feet   DDD (degenerative disc disease), lumbar 2012   Dental crowns present    Depression    "LITTLE BIT"   GERD (gastroesophageal reflux disease)    occ, no medications at this time   History of anemia 1991   not since hysterectomy   History of gross hematuria 2002   with kidney stones   History of  hyperparathyroidism    History of kidney stones    Hyperlipidemia    Hypertension    states under control with meds., has been on med. x 20 yr.   Impingement syndrome of right shoulder 2017   Lymphedema of both lower extremities    due to neuropathy   Lymphedema of both upper extremities    due to neuropathy   Obese    Osteoarthritis    Peripheral neuropathy    hands and feet   Sciatica    Triangular fibrocartilage complex tear 06/2017   right   Ulnar impaction syndrome, right 06/2017   Wears partial dentures    upper and lower    Past Surgical History:  Procedure Laterality Date   ABDOMINAL HYSTERECTOMY  1991   partial   CARPAL TUNNEL RELEASE Right 08/09/2002   CARPAL TUNNEL RELEASE Left 11/03/2006   CATARACT EXTRACTION Left 07/21/2021   COLONOSCOPY  01/2016   COLONOSCOPY WITH PROPOFOL  11/24/2011   CYSTOSCOPY WITH RETROGRADE PYELOGRAM, URETEROSCOPY AND STENT PLACEMENT Left 12/18/2014   Procedure: CYSTOSCOPY WITH RETROGRADE PYELOGRAM, URETEROSCOPY AND STENT PLACEMENT left ureter;  Surgeon: Raynelle Bring, MD;  [facility];  Service: Urology;  Laterality: Left;   DILATION AND CURETTAGE OF UTERUS     ELBOW ARTHROSCOPY Right    EXTRACORPOREAL SHOCK WAVE LITHOTRIPSY Left 03/08/2017   Procedure: LEFT EXTRACORPOREAL SHOCK WAVE LITHOTRIPSY (ESWL);  Surgeon: Raynelle Bring, MD;  Location: [facility];  Service: Urology;  Laterality: Left;   HOLMIUM LASER APPLICATION Left [id number]   Procedure: HOLMIUM LASER APPLICATION left ureter ;  Surgeon: Raynelle Bring, MD;  [facility];  Service: Urology;  Laterality: Left;   PARATHYROIDECTOMY Right 05/03/2015   Procedure: RIGHT SUPERIOR PARATHYROIDECTOMY;  Surgeon: Armandina Gemma, MD;  Location: [place];  Service: General;  Laterality: Right;   PATELLA REALIGNMENT Bilateral    as a teenager   ROTATOR CUFF REPAIR Right    x2   SHOULDER ARTHROSCOPY Right    x 2   SHOULDER ARTHROSCOPY W/ ROTATOR CUFF REPAIR Left 04/26/2002   SYNOVECTOMY  WITH POLY EXCHANGE Left 11/28/2018   Procedure: SYNOVECTOMY WITH Posey Pronto;  Surgeon: Vickey Huger, MD;  [facility];  Service: Orthopedics;  Laterality: Left;   TONSILLECTOMY     TOTAL KNEE ARTHROPLASTY Left 09/10/2003   TOTAL KNEE ARTHROPLASTY Right 11/10/2004   TOTAL KNEE ARTHROPLASTY Right 05/02/2018   Procedure: Right polyethelene exchange and open synovectomy;  Surgeon: Vickey Huger, MD;  [facility];  Service: Orthopedics;  Laterality: Right;   TRIGGER FINGER RELEASE Right 08/06/2008   ring finger   TRIGGER FINGER RELEASE     multiple - right thumb, left index/long/ring/thumb   ULNA OSTEOTOMY Right 07/28/2017   Procedure: REVISION RIGHT ULNAR SHORTENING OSTEOTOMY;  Surgeon: Leandrew Koyanagi, MD;  Location: [place];  Service: Orthopedics;  Laterality: Right;   WRIST ARTHROSCOPY Left 06/16/2000   WRIST ARTHROSCOPY WITH ULNA SHORTENING Right 07/07/2017   Procedure: RIGHT WRIST ARTHROSCOPY WITH ULNAR SHORTENING OSTEOTOMY;  Surgeon: Leandrew Koyanagi, MD;  Location: [place];  Service: Orthopedics;  Laterality: Right;    Family History  Problem Relation Age of Onset   Stroke Mother    Cancer Mother 61       bone cancer   Other Father        unknown medical history   Diabetes Sister    Colon cancer Brother    Liver cancer Brother    Colon polyps Child    Crohn's disease Neg Hx    Esophageal cancer Neg Hx    Rectal cancer Neg Hx    Stomach cancer Neg Hx    Ulcerative colitis Neg Hx     Current Outpatient Medications:    Cholecalciferol (VITAMIN D) 50 MCG (2000 UT) tablet, Take 2,000 Units by mouth daily. , Disp: , Rfl:    Cyanocobalamin (VITAMIN B-12 PO), Take by mouth. TAKE 3 X A WEEK, Disp: , Rfl:    cycloSPORINE (RESTASIS) 0.05 % ophthalmic emulsion, Place 1 drop into both eyes 2 (two) times daily., Disp: , Rfl:    fexofenadine (ALLEGRA ALLERGY) 60 MG tablet, Take 1 tablet (60 mg total) by mouth daily., Disp: 30 tablet, Rfl: 1    fluticasone (FLONASE) 50 MCG/ACT nasal spray, Place 1 spray into both nostrils daily., Disp: 15.8 mL, Rfl: 2   hydrochlorothiazide (HYDRODIURIL) 12.5 MG tablet, Take 12.5 mg by mouth in the morning and at bedtime., Disp: , Rfl:    lidocaine (XYLOCAINE) 5 % ointment, APPLY 1 APPLICATION TOPICALLY 3  TIMES DAILY AS NEEDED, Disp: 425.28 g, Rfl: 0   lidocaine-prilocaine (EMLA) cream, Apply 1 application topically to affected areas 3 times daily as needed., Disp: 360 g, Rfl: 11   lisinopril (ZESTRIL) 10 MG tablet, TAKE 1 TABLET BY MOUTH  DAILY, Disp: 90 tablet, Rfl: 3   Multiple Vitamin (MULTIVITAMIN) tablet, Take 1 tablet by mouth daily., Disp: , Rfl:  nortriptyline (PAMELOR) 25 MG capsule, TAKE 3 CAPSULES BY MOUTH AT  BEDTIME, Disp: 270 capsule, Rfl: 3   pregabalin (LYRICA) 150 MG capsule, TAKE 1 CAPSULE BY MOUTH TWICE  DAILY, Disp: 60 capsule, Rfl: 5   traMADol (ULTRAM) 50 MG tablet, Take one to two tablets by mouth every 6 hours if needed for pain, Prescribed by Neurology, Disp: 180 tablet, Rfl: 0  EXAM:  VITALS per patient if applicable:  RR between 12-20 bpm  GENERAL: alert, oriented, appears well and in no acute distress  HEENT: atraumatic, conjunctiva clear, no obvious abnormalities on inspection of external nose and ears  NECK: normal movements of the head and neck  LUNGS: on inspection no signs of respiratory distress, breathing rate appears normal, no obvious gross SOB, gasping or wheezing  CV: no obvious cyanosis  MS: moves all visible extremities without noticeable abnormality  PSYCH/NEURO: pleasant and cooperative, no obvious depression or anxiety, speech and thought processing grossly intact  ASSESSMENT AND PLAN:  Discussed the following assessment and plan:  Spinal stenosis, lumbar region with neurogenic claudication -increasing symptoms -surgery to be scheduled -f/u with Ortho.  Chronic right shoulder pain -continue f/u with ortho   Diet-controlled diabetes  mellitus -A1C was 6.2% 06/15/22 -continue lifestyle modifications  Hereditary and idiopathic peripheral neuropathy -stable -unfortunately not many options remaining for symptoms -continue current medications -continue f/u with Neuro prn  Has appt in ~4 wks for f/u already schedule.   I discussed the assessment and treatment plan with the patient. The patient was provided an opportunity to ask questions and all were answered. The patient agreed with the plan and demonstrated an understanding of the instructions.   The patient was advised to call back or seek an in-person evaluation if the symptoms worsen or if the condition fails to improve as anticipated.   Billie Ruddy, MD

## 2022-08-14 NOTE — Telephone Encounter (Signed)
Done

## 2022-08-25 ENCOUNTER — Encounter: Payer: Self-pay | Admitting: Orthopaedic Surgery

## 2022-08-25 ENCOUNTER — Ambulatory Visit (INDEPENDENT_AMBULATORY_CARE_PROVIDER_SITE_OTHER): Payer: Medicare Other | Admitting: Orthopaedic Surgery

## 2022-08-25 ENCOUNTER — Other Ambulatory Visit (INDEPENDENT_AMBULATORY_CARE_PROVIDER_SITE_OTHER): Payer: Medicare Other

## 2022-08-25 DIAGNOSIS — M25512 Pain in left shoulder: Secondary | ICD-10-CM | POA: Diagnosis not present

## 2022-08-25 NOTE — Progress Notes (Signed)
Office Visit Note   Patient: Katherine [name]           Date of Birth: 07-15-1950           MRN: [id number] Visit Date: 08/25/2022              Requested by: Deeann Saint, MD [address] [place],  [place] [address] PCP: Deeann Saint, MD   Assessment & Plan: Visit Diagnoses:  1. Acute pain of left shoulder     Plan: Impression 72 year old female with acute left shoulder pain status post fall.  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  I think is less likely that she has a fracture so we will start with symptomatic treatment.  She declined a steroid injection.  She will use over-the-counter medications and activity modification.  If her symptoms do not feel like they are significantly improved in a week she will message me so that I can obtain a CT scan.  It is encouraging that she is able to move her arm by her side and weight-bear with a rollator without any difficulty.  Follow-Up Instructions: No follow-ups on file.   Orders:  Orders Placed This Encounter  Procedures   XR Shoulder Left   No orders of the defined types were placed in this encounter.     Procedures: No procedures performed   Clinical Data: No additional findings.   Subjective: Chief Complaint  Patient presents with   Left Shoulder - Injury    DOI 08/24/2022    HPI  Tae is a 72 year old female with chronic bilateral shoulder pain with recent worsening in the left shoulder pain status post falling twice in her bathroom yesterday.  She has noticed pain and decreased range of motion.  Review of Systems  Constitutional: Negative.   HENT: Negative.    Eyes: Negative.   Respiratory: Negative.    Cardiovascular: Negative.   Endocrine: Negative.   Musculoskeletal: Negative.   Neurological: Negative.   Hematological: Negative.   Psychiatric/Behavioral: Negative.    All  other systems reviewed and are negative.    Objective: Vital Signs: There were no vitals taken for this visit.  Physical Exam Vitals and nursing note reviewed.  Constitutional:      Appearance: She is well-developed.  HENT:     Head: Atraumatic.     Nose: Nose normal.  Eyes:     Extraocular Movements: Extraocular movements intact.  Cardiovascular:     Pulses: Normal pulses.  Pulmonary:     Effort: Pulmonary effort is normal.  Abdominal:     Palpations: Abdomen is soft.  Musculoskeletal:     Cervical back: Neck supple.  Skin:    General: Skin is warm.     Capillary Refill: Capillary refill takes less than 2 seconds.  Neurological:     Mental Status: She is alert. Mental status is at baseline.  Psychiatric:        Behavior: Behavior normal.        Thought Content: Thought content normal.        Judgment: Judgment normal.     Ortho Exam  Examination of left shoulder girdle shows pain and guarding to forward flexion abduction past 75 degrees.  She is able to weight-bear through the arm with a rollator.  I can rotate her arm by her side without any pain.  Manual muscle testing of the rotator cuff is very limited secondary to pain.  Specialty Comments:  No specialty comments available.  Imaging: No results found.   PMFS History: Patient Active Problem List   Diagnosis Date Noted   Diet-controlled diabetes mellitus 03/11/2022   Mixed hyperlipidemia 03/11/2022   Cataract of left eye 03/11/2022   Spinal stenosis, lumbar region with neurogenic claudication 02/05/2021   Prediabetes 11/06/2019   Peripheral neuropathy 02/09/2019   Chronic right-sided low back pain with right-sided sciatica 02/09/2019   S/P revision of total knee 11/28/2018   S/P total knee replacement 05/02/2018   Body mass index 40.0-44.9, adult 10/22/2017   Closed displaced oblique fracture of shaft of ulna with malunion, subsequent encounter 07/28/2017   Ulnar impaction syndrome, right 07/07/2017    Arthritis of right wrist 07/07/2017   Pain in right wrist 06/01/2017   Trigger finger, right index finger 09/28/2016   Impaired glucose tolerance 09/09/2015   Hyperparathyroidism (HCC) 05/26/2013   Hereditary and idiopathic peripheral neuropathy 12/29/2012   Morbid obesity (HCC)    Menopausal symptoms 11/10/2011   DIVERTICULOSIS, COLON 11/02/2007   Dyslipidemia 03/09/2006   Essential hypertension 03/09/2006   NEPHROLITHIASIS, HX OF 03/09/2006   Past Medical History:  Diagnosis Date   Cataract    LEFT,REMOVED   Complication of anesthesia     neuropathy in feet flares after surgery from PAS hose. needs cream on feet   DDD (degenerative disc disease), lumbar 2012   Dental crowns present    Depression    "LITTLE BIT"   GERD (gastroesophageal reflux disease)    occ, no medications at this time   History of anemia 1991   not since hysterectomy   History of gross hematuria 2002   with kidney stones   History of hyperparathyroidism    History of kidney stones    Hyperlipidemia    Hypertension    states under control with meds., has been on med. x 20 yr.   Impingement syndrome of right shoulder 2017   Lymphedema of both lower extremities    due to neuropathy   Lymphedema of both upper extremities    due to neuropathy   Obese    Osteoarthritis    Peripheral neuropathy    hands and feet   Sciatica    Triangular fibrocartilage complex tear 06/2017   right   Ulnar impaction syndrome, right 06/2017   Wears partial dentures    upper and lower    Family History  Problem Relation Age of Onset   Stroke Mother    Cancer Mother 71       bone cancer   Other Father        unknown medical history   Diabetes Sister    Colon cancer Brother    Liver cancer Brother    Colon polyps Child    Crohn's disease Neg Hx    Esophageal cancer Neg Hx    Rectal cancer Neg Hx    Stomach cancer Neg Hx    Ulcerative colitis Neg Hx     Past Surgical History:  Procedure Laterality Date    ABDOMINAL HYSTERECTOMY  1991   partial   CARPAL TUNNEL RELEASE Right 08/09/2002   CARPAL TUNNEL RELEASE Left 11/03/2006   CATARACT EXTRACTION Left 07/21/2021   COLONOSCOPY  01/2016   COLONOSCOPY WITH PROPOFOL  11/24/2011   CYSTOSCOPY WITH RETROGRADE PYELOGRAM, URETEROSCOPY AND STENT PLACEMENT Left 12/18/2014   Procedure: CYSTOSCOPY WITH RETROGRADE PYELOGRAM, URETEROSCOPY AND STENT PLACEMENT left ureter;  Surgeon: Heloise Purpura, MD;  Location: [facility];  Service: Urology;  Laterality: Left;   DILATION AND CURETTAGE OF UTERUS     ELBOW ARTHROSCOPY Right    EXTRACORPOREAL SHOCK WAVE LITHOTRIPSY Left 03/08/2017   Procedure: LEFT EXTRACORPOREAL SHOCK WAVE LITHOTRIPSY (ESWL);  Surgeon: Heloise Purpura, MD;  Location: [facility];  Service: Urology;  Laterality: Left;   HOLMIUM LASER APPLICATION Left 12/18/2014   Procedure: HOLMIUM LASER APPLICATION left ureter ;  Surgeon: Heloise Purpura, MD;  Location: [facility];  Service: Urology;  Laterality: Left;   PARATHYROIDECTOMY Right 05/03/2015   Procedure: RIGHT SUPERIOR PARATHYROIDECTOMY;  Surgeon: Darnell Level, MD;  Location: [facility];  Service: General;  Laterality: Right;   PATELLA REALIGNMENT Bilateral    as a teenager   ROTATOR CUFF REPAIR Right    x2   SHOULDER ARTHROSCOPY Right    x 2   SHOULDER ARTHROSCOPY W/ ROTATOR CUFF REPAIR Left 04/26/2002   SYNOVECTOMY WITH POLY EXCHANGE Left 11/28/2018   Procedure: SYNOVECTOMY WITH Levora Angel;  Surgeon: Dannielle Huh, MD;  Location: [facility];  Service: Orthopedics;  Laterality: Left;   TONSILLECTOMY     TOTAL KNEE ARTHROPLASTY Left 09/10/2003   TOTAL KNEE ARTHROPLASTY Right 11/10/2004   TOTAL KNEE ARTHROPLASTY Right 05/02/2018   Procedure: Right polyethelene exchange and open synovectomy;  Surgeon: Dannielle Huh, MD;  Location: [facility];  Service: Orthopedics;  Laterality: Right;   TRIGGER FINGER RELEASE Right 08/06/2008   ring finger   TRIGGER FINGER RELEASE     multiple - right thumb, left index/long/ring/thumb    ULNA OSTEOTOMY Right 07/28/2017   Procedure: REVISION RIGHT ULNAR SHORTENING OSTEOTOMY;  Surgeon: Tarry Kos, MD;  Location: [facility];  Service: Orthopedics;  Laterality: Right;   WRIST ARTHROSCOPY Left 06/16/2000   WRIST ARTHROSCOPY WITH ULNA SHORTENING Right 07/07/2017   Procedure: RIGHT WRIST ARTHROSCOPY WITH ULNAR SHORTENING OSTEOTOMY;  Surgeon: Tarry Kos, MD;  Location: [facility];  Service: Orthopedics;  Laterality: Right;   Social History   Occupational History   Occupation: Retired in 2011    Employer: [employer]  Tobacco Use   Smoking status: Never   Smokeless tobacco: Never  Vaping Use   Vaping Use: Never used  Substance and Sexual Activity   Alcohol use: Not Currently   Drug use: Never   Sexual activity: Never    Birth control/protection: Surgical

## 2022-09-14 ENCOUNTER — Ambulatory Visit (INDEPENDENT_AMBULATORY_CARE_PROVIDER_SITE_OTHER): Payer: Medicare Other | Admitting: Family Medicine

## 2022-09-14 VITALS — BP 108/76 | HR 107 | Temp 98.4°F | Wt 252.0 lb

## 2022-09-14 DIAGNOSIS — E119 Type 2 diabetes mellitus without complications: Secondary | ICD-10-CM

## 2022-09-14 DIAGNOSIS — M48062 Spinal stenosis, lumbar region with neurogenic claudication: Secondary | ICD-10-CM

## 2022-09-14 DIAGNOSIS — G609 Hereditary and idiopathic neuropathy, unspecified: Secondary | ICD-10-CM | POA: Diagnosis not present

## 2022-09-14 NOTE — Progress Notes (Signed)
Established Patient Office Visit   Subjective  Patient ID: Katherine [name], female    DOB: Jun 05, 1950  Age: 72 y.o. MRN: [id number]  Chief Complaint  Patient presents with   Follow-up    Pt is a 72 yo female seen for f/u.  Pt doing well.  Had Dexa scan done.  Still dealing with neuropathy.  Had 2-3 falls since last  OFV.  Fell in bathroom and in living room.  At times pt thinks her feet are moving/lifted but they aren't.  Effects reaction time.  Considering surgery for spinal stenosis as pain now intolerable.  Trying to find diabetic shoes, but having difficulty.  Was using [employer] brand which were really good.    Past Medical History:  Diagnosis Date   Cataract    LEFT,REMOVED   Complication of anesthesia     neuropathy in feet flares after surgery from PAS hose. needs cream on feet   DDD (degenerative disc disease), lumbar 2012   Dental crowns present    Depression    "LITTLE BIT"   GERD (gastroesophageal reflux disease)    occ, no medications at this time   History of anemia 1991   not since hysterectomy   History of gross hematuria 2002   with kidney stones   History of hyperparathyroidism    History of kidney stones    Hyperlipidemia    Hypertension    states under control with meds., has been on med. x 20 yr.   Impingement syndrome of right shoulder 2017   Lymphedema of both lower extremities    due to neuropathy   Lymphedema of both upper extremities    due to neuropathy   Obese    Osteoarthritis    Peripheral neuropathy    hands and feet   Sciatica    Triangular fibrocartilage complex tear 06/2017   right   Ulnar impaction syndrome, right 06/2017   Wears partial dentures    upper and lower   Past Surgical History:  Procedure Laterality Date   ABDOMINAL HYSTERECTOMY  1991   partial   CARPAL TUNNEL RELEASE Right 08/09/2002   CARPAL TUNNEL RELEASE Left 11/03/2006   CATARACT EXTRACTION Left 07/21/2021   COLONOSCOPY  01/2016   COLONOSCOPY WITH PROPOFOL   11/24/2011   CYSTOSCOPY WITH RETROGRADE PYELOGRAM, URETEROSCOPY AND STENT PLACEMENT Left 12/18/2014   Procedure: CYSTOSCOPY WITH RETROGRADE PYELOGRAM, URETEROSCOPY AND STENT PLACEMENT left ureter;  Surgeon: Heloise Purpura, MD;  Location: [facility];  Service: Urology;  Laterality: Left;   DILATION AND CURETTAGE OF UTERUS     ELBOW ARTHROSCOPY Right    EXTRACORPOREAL SHOCK WAVE LITHOTRIPSY Left 03/08/2017   Procedure: LEFT EXTRACORPOREAL SHOCK WAVE LITHOTRIPSY (ESWL);  Surgeon: Heloise Purpura, MD;  Location: [facility];  Service: Urology;  Laterality: Left;   HOLMIUM LASER APPLICATION Left 12/18/2014   Procedure: HOLMIUM LASER APPLICATION left ureter ;  Surgeon: Heloise Purpura, MD;  Location: [facility];  Service: Urology;  Laterality: Left;   PARATHYROIDECTOMY Right 05/03/2015   Procedure: RIGHT SUPERIOR PARATHYROIDECTOMY;  Surgeon: Darnell Level, MD;  Location: [facility];  Service: General;  Laterality: Right;   PATELLA REALIGNMENT Bilateral    as a teenager   ROTATOR CUFF REPAIR Right    x2   SHOULDER ARTHROSCOPY Right    x 2   SHOULDER ARTHROSCOPY W/ ROTATOR CUFF REPAIR Left 04/26/2002   SYNOVECTOMY WITH POLY EXCHANGE Left 11/28/2018   Procedure: SYNOVECTOMY WITH Levora Angel;  Surgeon: Dannielle Huh, MD;  Location: [facility];  Service:  Orthopedics;  Laterality: Left;   TONSILLECTOMY     TOTAL KNEE ARTHROPLASTY Left 09/10/2003   TOTAL KNEE ARTHROPLASTY Right 11/10/2004   TOTAL KNEE ARTHROPLASTY Right 05/02/2018   Procedure: Right polyethelene exchange and open synovectomy;  Surgeon: Dannielle Huh, MD;  Location: [facility];  Service: Orthopedics;  Laterality: Right;   TRIGGER FINGER RELEASE Right 08/06/2008   ring finger   TRIGGER FINGER RELEASE     multiple - right thumb, left index/long/ring/thumb   ULNA OSTEOTOMY Right 07/28/2017   Procedure: REVISION RIGHT ULNAR SHORTENING OSTEOTOMY;  Surgeon: Tarry Kos, MD;  Location: [facility];  Service: Orthopedics;  Laterality: Right;   WRIST  ARTHROSCOPY Left 06/16/2000   WRIST ARTHROSCOPY WITH ULNA SHORTENING Right 07/07/2017   Procedure: RIGHT WRIST ARTHROSCOPY WITH ULNAR SHORTENING OSTEOTOMY;  Surgeon: Tarry Kos, MD;  Location: [facility];  Service: Orthopedics;  Laterality: Right;   Social History   Tobacco Use   Smoking status: Never   Smokeless tobacco: Never  Vaping Use   Vaping Use: Never used  Substance Use Topics   Alcohol use: Not Currently   Drug use: Never   Family History  Problem Relation Age of Onset   Stroke Mother    Cancer Mother 7       bone cancer   Other Father        unknown medical history   Diabetes Sister    Colon cancer Brother    Liver cancer Brother    Colon polyps Child    Crohn's disease Neg Hx    Esophageal cancer Neg Hx    Rectal cancer Neg Hx    Stomach cancer Neg Hx    Ulcerative colitis Neg Hx    Allergies  Allergen Reactions   Erythromycin Other (See Comments)    CHEST PAIN   Paroxetine Other (See Comments)      ROS Negative unless stated above    Objective:     BP 108/76 (BP Location: Right Arm, Patient Position: Sitting, Cuff Size: Large)   Pulse (!) 115   Temp 98.4 F (36.9 C) (Oral)   Wt 252 lb (114.3 kg)   SpO2 96%   BMI 42.59 kg/m  BP Readings from Last 3 Encounters:  09/14/22 108/76  06/24/22 (!) 94/59  06/15/22 104/80   Wt Readings from Last 3 Encounters:  09/14/22 252 lb (114.3 kg)  06/24/22 244 lb (110.7 kg)  06/15/22 251 lb 9.6 oz (114.1 kg)      Physical Exam Constitutional:      General: She is not in acute distress.    Appearance: Normal appearance.  HENT:     Head: Normocephalic and atraumatic.     Nose: Nose normal.     Mouth/Throat:     Mouth: Mucous membranes are moist.  Cardiovascular:     Rate and Rhythm: Normal rate and regular rhythm.     Heart sounds: Normal heart sounds. No murmur heard.    No gallop.  Pulmonary:     Effort: Pulmonary effort is normal. No respiratory distress.     Breath  sounds: Normal breath sounds. No wheezing, rhonchi or rales.  Skin:    General: Skin is warm and dry.  Neurological:     Mental Status: She is alert and oriented to person, place, and time.     No results found for any visits on 09/14/22.    Assessment & Plan:  Spinal stenosis, lumbar region with neurogenic claudication -seriously considering surgery  as pain increasing. -continue current meds including lyrica, nortriptyline, tramadol -continue f/u with Neurology  Diet-controlled diabetes mellitus (HCC) -Hgb A1C 6.2% on 06/15/22 -continue lifestyle modifications -foot exam done 06/15/22 -will look into [employer] shoes/what is needed for insurance to cover. -     Ambulatory referral to Podiatry  Hereditary and idiopathic peripheral neuropathy -seen by Neurology.  Does not wish to have any additional NCS/EMG studies. -continue current medications including lyrica 150 mg BID, EMLA cream, Nortriptyline 75 mg QHS -     Ambulatory referral to Podiatry   Return in about 3 months (around 12/15/2022).   Deeann Saint, MD

## 2022-09-14 NOTE — Patient Instructions (Addendum)
We will look online to see what exactly [employer] requires.  A referral to podiatry was placed.  You should expect a phone call about scheduling this appointment.  If we are able to write a prescription without the podiatrist we will do so.

## 2022-09-23 ENCOUNTER — Ambulatory Visit (INDEPENDENT_AMBULATORY_CARE_PROVIDER_SITE_OTHER): Payer: Medicare Other | Admitting: Podiatry

## 2022-09-23 ENCOUNTER — Encounter: Payer: Self-pay | Admitting: Podiatry

## 2022-09-23 DIAGNOSIS — M2042 Other hammer toe(s) (acquired), left foot: Secondary | ICD-10-CM | POA: Diagnosis not present

## 2022-09-23 DIAGNOSIS — E1142 Type 2 diabetes mellitus with diabetic polyneuropathy: Secondary | ICD-10-CM

## 2022-09-23 DIAGNOSIS — M2041 Other hammer toe(s) (acquired), right foot: Secondary | ICD-10-CM

## 2022-09-23 NOTE — Progress Notes (Signed)
  Subjective:  Patient ID: Katherine [name], female    DOB: 1950/06/20,   MRN: [id number]  Chief Complaint  Patient presents with   Foot Problem    diabetic peripheal neuropathy;interested in diabetic shoes    72 y.o. female presents for concern of neuropathy. Also interested in diabetic shoes.   Relates burning and tingling in their feet. Patient is diabetic and last A1c was  Lab Results  Component Value Date   HGBA1C 6.2 (A) 06/15/2022   .   PCP:  Deeann Saint, MD    Denies any other pedal complaints. Denies n/v/f/c.   Past Medical History:  Diagnosis Date   Cataract    LEFT,REMOVED   Complication of anesthesia     neuropathy in feet flares after surgery from PAS hose. needs cream on feet   DDD (degenerative disc disease), lumbar 2012   Dental crowns present    Depression    "LITTLE BIT"   GERD (gastroesophageal reflux disease)    occ, no medications at this time   History of anemia 1991   not since hysterectomy   History of gross hematuria 2002   with kidney stones   History of hyperparathyroidism    History of kidney stones    Hyperlipidemia    Hypertension    states under control with meds., has been on med. x 20 yr.   Impingement syndrome of right shoulder 2017   Lymphedema of both lower extremities    due to neuropathy   Lymphedema of both upper extremities    due to neuropathy   Obese    Osteoarthritis    Peripheral neuropathy    hands and feet   Sciatica    Triangular fibrocartilage complex tear 06/2017   right   Ulnar impaction syndrome, right 06/2017   Wears partial dentures    upper and lower    Objective:  Physical Exam: Vascular: DP/PT pulses 2/4 bilateral. CFT <3 seconds. Absent hair growth on digits. Edema noted to bilateral lower extremities. Xerosis noted bilaterally.  Skin. No lacerations or abrasions bilateral feet. Nails 1-5 bilateral  are normal in appearance.  Musculoskeletal: MMT 5/5 bilateral lower extremities in DF, PF,  Inversion and Eversion. Deceased ROM in DF of ankle joint. Mild hammered of digits 2-5 bilateral  Neurological: Sensation intact to light touch. Protective sensation diminished bilateral.    Assessment:   1. Type 2 diabetes mellitus with polyneuropathy (HCC)   2. Hammertoe, bilateral      Plan:  Patient was evaluated and treated and all questions answered. -Discussed and educated patient on diabetic foot care, especially with  regards to the vascular, neurological and musculoskeletal systems.  -Stressed the importance of good glycemic control and the detriment of not  controlling glucose levels in relation to the foot. -Discussed supportive shoes at all times and checking feet regularly.  -Will be schedule for DM shoe fitting.  -Answered all patient questions -Patient to return  in 1 year for DM foot check.  -Patient advised to call the office if any problems or questions arise in the meantime.   Louann Sjogren, DPM

## 2022-10-01 LAB — HM DIABETES EYE EXAM

## 2022-10-11 ENCOUNTER — Encounter: Payer: Self-pay | Admitting: Family Medicine

## 2022-10-13 ENCOUNTER — Other Ambulatory Visit: Payer: Self-pay | Admitting: Family Medicine

## 2022-10-14 ENCOUNTER — Ambulatory Visit: Payer: Medicare Other

## 2022-10-14 DIAGNOSIS — E1142 Type 2 diabetes mellitus with diabetic polyneuropathy: Secondary | ICD-10-CM

## 2022-10-14 DIAGNOSIS — M2041 Other hammer toe(s) (acquired), right foot: Secondary | ICD-10-CM

## 2022-10-14 NOTE — Progress Notes (Signed)
Patient presents today to be measured for diabetic shoes and insoles.  Patient was measured for 1 pair of diabetic shoes and 3 pairs of foam casted diabetic insoles.   Shoe size : 8.5 Wt 249 Shoe selection 1. [id number]                          2. [id number]  Diabetes doctor: shannon banks           Re-appointment for regularly scheduled diabetic foot care visits or if they should experience any trouble with the shoes or insoles.

## 2022-10-20 ENCOUNTER — Other Ambulatory Visit: Payer: Self-pay | Admitting: Neurological Surgery

## 2022-10-21 ENCOUNTER — Other Ambulatory Visit: Payer: Self-pay | Admitting: Neurological Surgery

## 2022-10-27 ENCOUNTER — Telehealth: Payer: Self-pay | Admitting: Family Medicine

## 2022-10-27 NOTE — Telephone Encounter (Signed)
FYI

## 2022-10-27 NOTE — Telephone Encounter (Addendum)
Pt called to inform MD that she is scheduled to have her back surgery 11/06/22.  Katherine Pouch, DO [facility] - will be performing the surgery.

## 2022-10-27 NOTE — Pre-Procedure Instructions (Signed)
Surgical Instructions    Your procedure is scheduled on Friday, June 28th.  Report to [facility] Main Entrance "A" at 10:30 A.M., then check in with the Admitting office.  Call this number if you have problems the morning of surgery:  [phone]  If you have any questions prior to your surgery date call [phone]: Open Monday-Friday 8am-4pm If you experience any cold or flu symptoms such as cough, fever, chills, shortness of breath, etc. between now and your scheduled surgery, please notify us at the above number.     Remember:  Do not eat or drink after midnight the night before your surgery   Take these medicines the morning of surgery with A SIP OF WATER  pregabalin (LYRICA)  traMADol (ULTRAM)-as needed cycloSPORINE (RESTASIS)-as needed  As of today, STOP taking any Aspirin (unless otherwise instructed by your surgeon) Aleve, Naproxen, Ibuprofen, Motrin, Advil, Goody's, BC's, all herbal medications, fish oil, and all vitamins.                     Do NOT Smoke (Tobacco/Vaping) for 24 hours prior to your procedure.  If you use a CPAP at night, you may bring your mask/headgear for your overnight stay.   Contacts, glasses, piercing's, hearing aid's, dentures or partials may not be worn into surgery, please bring cases for these belongings.    For patients admitted to the hospital, discharge time will be determined by your treatment team.   Patients discharged the day of surgery will not be allowed to drive home, and someone needs to stay with them for 24 hours.  SURGICAL WAITING ROOM VISITATION Patients having surgery or a procedure may have no more than 2 support people in the waiting area - these visitors may rotate.   Children under the age of 73 must have an adult with them who is not the patient. If the patient needs to stay at the hospital during part of their recovery, the visitor guidelines for inpatient rooms apply. Pre-op nurse will coordinate an appropriate time  for 1 support person to accompany patient in pre-op.  This support person may not rotate.   Please refer to the [facility] website for the visitor guidelines for Inpatients (after your surgery is over and you are in a regular room).   If you received a COVID test during your pre-op visit  it is requested that you wear a mask when out in public, stay away from anyone that may not be feeling well and notify your surgeon if you develop symptoms. If you have been in contact with anyone that has tested positive in the last 10 days please notify you surgeon.  Special instructions:   [facility]- Preparing For Surgery   Pre-operative 5 CHG Bath Instructions   You can play a key role in reducing the risk of infection after surgery. Your skin needs to be as free of germs as possible. You can reduce the number of germs on your skin by washing with CHG (chlorhexidine gluconate) soap before surgery. CHG is an antiseptic soap that kills germs and continues to kill germs even after washing.   DO NOT use if you have an allergy to chlorhexidine/CHG or antibacterial soaps. If your skin becomes reddened or irritated, stop using the CHG and notify one of our RNs at [phone].   Please shower with the CHG soap starting 4 days before surgery using the following schedule:     Please keep in mind the following:  DO NOT  shave, including legs and underarms, starting the day of your first shower.   You may shave your face at any point before/day of surgery.  Place clean sheets on your bed the day you start using CHG soap. Use a clean washcloth (not used since being washed) for each shower. DO NOT sleep with pets once you start using the CHG.   CHG Shower Instructions:  If you choose to wash your hair and private area, wash first with your normal shampoo/soap.  After you use shampoo/soap, rinse your hair and body thoroughly to remove shampoo/soap residue.  Turn the water OFF and apply about 3 tablespoons (45  ml) of CHG soap to a CLEAN washcloth.  Apply CHG soap ONLY FROM YOUR NECK DOWN TO YOUR TOES (washing for 3-5 minutes)  DO NOT use CHG soap on face, private areas, open wounds, or sores.  Pay special attention to the area where your surgery is being performed.  If you are having back surgery, having someone wash your back for you may be helpful. Wait 2 minutes after CHG soap is applied, then you may rinse off the CHG soap.  Pat dry with a clean towel  Put on clean clothes/pajamas   If you choose to wear lotion, please use ONLY the CHG-compatible lotions on the back of this paper.     Additional instructions for the day of surgery: DO NOT APPLY any lotions, deodorants, cologne, or perfumes.   Do not wear jewelry or makeup. Do not wear nail polish, gel polish, artificial nails, or any other type of covering on natural nails (fingers and toes). Put on clean/comfortable clothes.  Brush your teeth.  Ask your nurse before applying any prescription medications to the skin.      CHG Compatible Lotions   Aveeno Moisturizing lotion  Cetaphil Moisturizing Cream  Cetaphil Moisturizing Lotion  Clairol Herbal Essence Moisturizing Lotion, Dry Skin  Clairol Herbal Essence Moisturizing Lotion, Extra Dry Skin  Clairol Herbal Essence Moisturizing Lotion, Normal Skin  Curel Age Defying Therapeutic Moisturizing Lotion with Alpha Hydroxy  Curel Extreme Care Body Lotion  Curel Soothing Hands Moisturizing Hand Lotion  Curel Therapeutic Moisturizing Cream, Fragrance-Free  Curel Therapeutic Moisturizing Lotion, Fragrance-Free  Curel Therapeutic Moisturizing Lotion, Original Formula  Eucerin Daily Replenishing Lotion  Eucerin Dry Skin Therapy Plus Alpha Hydroxy Crme  Eucerin Dry Skin Therapy Plus Alpha Hydroxy Lotion  Eucerin Original Crme  Eucerin Original Lotion  Eucerin Plus Crme Eucerin Plus Lotion  Eucerin TriLipid Replenishing Lotion  Keri Anti-Bacterial Hand Lotion  Keri Deep Conditioning  Original Lotion Dry Skin Formula Softly Scented  Keri Deep Conditioning Original Lotion, Fragrance Free Sensitive Skin Formula  Keri Lotion Fast Absorbing Fragrance Free Sensitive Skin Formula  Keri Lotion Fast Absorbing Softly Scented Dry Skin Formula  Keri Original Lotion  Keri Skin Renewal Lotion Keri Silky Smooth Lotion  Keri Silky Smooth Sensitive Skin Lotion  Nivea Body Creamy Conditioning Oil  Nivea Body Extra Enriched Lotion  Nivea Body Original Lotion  Nivea Body Sheer Moisturizing Lotion Nivea Crme  Nivea Skin Firming Lotion  NutraDerm 30 Skin Lotion  NutraDerm Skin Lotion  NutraDerm Therapeutic Skin Cream  NutraDerm Therapeutic Skin Lotion  ProShield Protective Hand Cream  Provon moisturizing lotion   Please read over the following fact sheets that you were given.

## 2022-10-28 ENCOUNTER — Encounter (HOSPITAL_COMMUNITY)
Admission: RE | Admit: 2022-10-28 | Discharge: 2022-10-28 | Disposition: A | Discharge status: Home / Self Care | Payer: Medicare Other | Source: Ambulatory Visit | Attending: Neurological Surgery | Admitting: Neurological Surgery

## 2022-10-28 ENCOUNTER — Encounter (HOSPITAL_COMMUNITY): Payer: Self-pay

## 2022-10-28 ENCOUNTER — Other Ambulatory Visit: Payer: Self-pay

## 2022-10-28 VITALS — BP 133/81 | HR 115 | Temp 98.4°F | Resp 18 | Ht 64.0 in | Wt 253.6 lb

## 2022-10-28 DIAGNOSIS — Z01818 Encounter for other preprocedural examination: Secondary | ICD-10-CM | POA: Diagnosis present

## 2022-10-28 DIAGNOSIS — K5730 Diverticulosis of large intestine without perforation or abscess without bleeding: Secondary | ICD-10-CM | POA: Diagnosis not present

## 2022-10-28 DIAGNOSIS — E119 Type 2 diabetes mellitus without complications: Secondary | ICD-10-CM | POA: Insufficient documentation

## 2022-10-28 HISTORY — DX: Type 2 diabetes mellitus without complications: E11.9

## 2022-10-28 LAB — CBC
HCT: 39.6 % (ref 36.0–46.0)
Hemoglobin: 13.4 g/dL (ref 12.0–15.0)
MCH: 29.5 pg (ref 26.0–34.0)
MCHC: 33.8 g/dL (ref 30.0–36.0)
MCV: 87 fL (ref 80.0–100.0)
Platelets: 147 10*3/uL — ABNORMAL LOW (ref 150–400)
RBC: 4.55 MIL/uL (ref 3.87–5.11)
RDW: 14 % (ref 11.5–15.5)
WBC: 4.4 10*3/uL (ref 4.0–10.5)
nRBC: 0 % (ref 0.0–0.2)

## 2022-10-28 LAB — BASIC METABOLIC PANEL
Anion gap: 11 (ref 5–15)
BUN: 10 mg/dL (ref 8–23)
CO2: 25 mmol/L (ref 22–32)
Calcium: 9.6 mg/dL (ref 8.9–10.3)
Chloride: 99 mmol/L (ref 98–111)
Creatinine, Ser: 0.92 mg/dL (ref 0.44–1.00)
GFR, Estimated: >60 mL/min (ref >60)
Glucose, Bld: 204 mg/dL — ABNORMAL HIGH (ref 70–99)
Potassium: 2.9 mmol/L — ABNORMAL LOW (ref 3.5–5.1)
Sodium: 135 mmol/L (ref 135–145)

## 2022-10-28 LAB — HEMOGLOBIN A1C
Hgb A1c MFr Bld: 6.8 % — ABNORMAL HIGH (ref 4.8–5.6)
Mean Plasma Glucose: 148.46 mg/dL

## 2022-10-28 LAB — SURGICAL PCR SCREEN

## 2022-10-28 NOTE — Progress Notes (Addendum)
PCP -  Deeann Saint, MD Cardiologist - Denies  PPM/ICD - Denies  Chest x-ray - Denies EKG - 10/28/2022 Stress Test - 07/25/2008 ECHO - Denies Cardiac Cath - Denies  Sleep Study - Denies  DM: Patient states she is diabetic and is unsure what type. Patient states that primary care provider has written in notes that she is diet controlled diabetic. Patient does not check blood sugars at home and is not taking oral diabetic medications.  Blood Thinner Instructions: N/A Aspirin Instructions: N/A  ERAS Protcol - NPO PRE-SURGERY Ensure or G2- N/A  COVID TEST- No   Anesthesia review: yes, labs: potassium  Patient denies shortness of breath, fever, cough and chest pain at PAT appointment   All instructions explained to the patient, with a verbal understanding of the material. Patient agrees to go over the instructions while at home for a better understanding.The opportunity to ask questions was provided.

## 2022-10-29 NOTE — Telephone Encounter (Signed)
Ok

## 2022-10-29 NOTE — Anesthesia Preprocedure Evaluation (Addendum)
Anesthesia Evaluation  Patient identified by MRN, date of birth, ID band Patient awake    Reviewed: Allergy & Precautions, NPO status , Patient's Chart, lab work & pertinent test results  Airway Mallampati: II  TM Distance: >3 FB Neck ROM: Full    Dental  (+) Partial Lower, Partial Upper, Dental Advisory Given   Pulmonary neg pulmonary ROS   breath sounds clear to auscultation       Cardiovascular hypertension, Pt. on medications  Rhythm:Regular Rate:Normal     Neuro/Psych  PSYCHIATRIC DISORDERS  Depression     Neuromuscular disease    GI/Hepatic Neg liver ROS,GERD  ,,  Endo/Other  diabetes    Renal/GU negative Renal ROS     Musculoskeletal  (+) Arthritis ,    Abdominal   Peds  Hematology negative hematology ROS (+)   Anesthesia Other Findings   Reproductive/Obstetrics                             Anesthesia Physical Anesthesia Plan  ASA: 3  Anesthesia Plan: General   Post-op Pain Management: Tylenol PO (pre-op)*   Induction: Intravenous  PONV Risk Score and Plan: 4 or greater and Ondansetron and Treatment may vary due to age or medical condition  Airway Management Planned: Oral ETT  Additional Equipment: None  Intra-op Plan:   Post-operative Plan: Extubation in OR  Informed Consent: I have reviewed the patients History and Physical, chart, labs and discussed the procedure including the risks, benefits and alternatives for the proposed anesthesia with the patient or authorized representative who has indicated his/her understanding and acceptance.     Dental advisory given  Plan Discussed with: CRNA  Anesthesia Plan Comments: (Hypokalemia noted on preop labs, potassium 2.9.  Surgeon's office notified.  Will recheck i-STAT day of surgery.)        Anesthesia Quick Evaluation

## 2022-11-06 ENCOUNTER — Ambulatory Visit (HOSPITAL_COMMUNITY): Payer: Medicare Other

## 2022-11-06 ENCOUNTER — Other Ambulatory Visit: Payer: Self-pay | Admitting: Family Medicine

## 2022-11-06 ENCOUNTER — Ambulatory Visit (HOSPITAL_COMMUNITY): Payer: Medicare Other | Admitting: Physician Assistant

## 2022-11-06 ENCOUNTER — Observation Stay (HOSPITAL_COMMUNITY)
Admission: RE | Admit: 2022-11-06 | Discharge: 2022-11-07 | Disposition: A | Discharge status: Home / Self Care | Payer: Medicare Other | Attending: Neurological Surgery | Admitting: Neurological Surgery

## 2022-11-06 ENCOUNTER — Ambulatory Visit (HOSPITAL_COMMUNITY)
Admission: RE | Disposition: A | Discharge status: Home / Self Care | Payer: Self-pay | Source: Home / Self Care | Attending: Neurological Surgery

## 2022-11-06 ENCOUNTER — Other Ambulatory Visit: Payer: Self-pay

## 2022-11-06 ENCOUNTER — Encounter (HOSPITAL_COMMUNITY): Payer: Self-pay | Admitting: Neurological Surgery

## 2022-11-06 ENCOUNTER — Ambulatory Visit (HOSPITAL_BASED_OUTPATIENT_CLINIC_OR_DEPARTMENT_OTHER): Payer: Medicare Other | Admitting: Physician Assistant

## 2022-11-06 DIAGNOSIS — E119 Type 2 diabetes mellitus without complications: Secondary | ICD-10-CM | POA: Insufficient documentation

## 2022-11-06 DIAGNOSIS — E782 Mixed hyperlipidemia: Secondary | ICD-10-CM | POA: Diagnosis not present

## 2022-11-06 DIAGNOSIS — E039 Hypothyroidism, unspecified: Secondary | ICD-10-CM | POA: Diagnosis not present

## 2022-11-06 DIAGNOSIS — I10 Essential (primary) hypertension: Secondary | ICD-10-CM | POA: Diagnosis not present

## 2022-11-06 DIAGNOSIS — G609 Hereditary and idiopathic neuropathy, unspecified: Secondary | ICD-10-CM

## 2022-11-06 DIAGNOSIS — M48062 Spinal stenosis, lumbar region with neurogenic claudication: Secondary | ICD-10-CM

## 2022-11-06 DIAGNOSIS — Z79899 Other long term (current) drug therapy: Secondary | ICD-10-CM | POA: Insufficient documentation

## 2022-11-06 DIAGNOSIS — Z96653 Presence of artificial knee joint, bilateral: Secondary | ICD-10-CM | POA: Diagnosis not present

## 2022-11-06 DIAGNOSIS — M48061 Spinal stenosis, lumbar region without neurogenic claudication: Secondary | ICD-10-CM | POA: Diagnosis present

## 2022-11-06 HISTORY — PX: LUMBAR LAMINECTOMY/DECOMPRESSION MICRODISCECTOMY: SHX5026

## 2022-11-06 LAB — POCT I-STAT, CHEM 8
BUN: 10 mg/dL (ref 8–23)
Calcium, Ion: 1.15 mmol/L (ref 1.15–1.40)
Chloride: 104 mmol/L (ref 98–111)
Creatinine, Ser: 0.8 mg/dL (ref 0.44–1.00)
Glucose, Bld: 132 mg/dL — ABNORMAL HIGH (ref 70–99)
HCT: 41 % (ref 36.0–46.0)
Hemoglobin: 13.9 g/dL (ref 12.0–15.0)
Potassium: 4.6 mmol/L (ref 3.5–5.1)
Sodium: 137 mmol/L (ref 135–145)
TCO2: 26 mmol/L (ref 22–32)

## 2022-11-06 LAB — GLUCOSE, CAPILLARY
Glucose-Capillary: 142 mg/dL — ABNORMAL HIGH (ref 70–99)
Glucose-Capillary: 137 mg/dL — ABNORMAL HIGH (ref 70–99)
Glucose-Capillary: 150 mg/dL — ABNORMAL HIGH (ref 70–99)
Glucose-Capillary: 143 mg/dL — ABNORMAL HIGH (ref 70–99)

## 2022-11-06 SURGERY — LUMBAR LAMINECTOMY/DECOMPRESSION MICRODISCECTOMY 1 LEVEL
Anesthesia: General | Site: Spine Lumbar

## 2022-11-06 MED ORDER — BUPIVACAINE-EPINEPHRINE (PF) 0.25% -1:200000 IJ SOLN
INTRAMUSCULAR | Status: AC
  Filled 2022-11-06: qty 30

## 2022-11-06 MED ORDER — CHLORHEXIDINE GLUCONATE 0.12 % MT SOLN
15.0000 mL | Freq: Once | OROMUCOSAL | Status: AC
  Administered 2022-11-06: 15 mL via OROMUCOSAL
  Filled 2022-11-06: qty 15

## 2022-11-06 MED ORDER — PROPOFOL 10 MG/ML IV BOLUS
INTRAVENOUS | Status: DC | PRN
  Administered 2022-11-06: 150 mg via INTRAVENOUS

## 2022-11-06 MED ORDER — HYDROCODONE-ACETAMINOPHEN 5-325 MG PO TABS
1.0000 | ORAL_TABLET | ORAL | Status: DC | PRN
  Administered 2022-11-06: 1 via ORAL
  Filled 2022-11-06: qty 1

## 2022-11-06 MED ORDER — LIDOCAINE-EPINEPHRINE 1 %-1:100000 IJ SOLN
INTRAMUSCULAR | Status: AC
  Filled 2022-11-06: qty 1

## 2022-11-06 MED ORDER — LIDOCAINE 2% (20 MG/ML) 5 ML SYRINGE
INTRAMUSCULAR | Status: AC
  Filled 2022-11-06: qty 5

## 2022-11-06 MED ORDER — ONDANSETRON HCL 4 MG PO TABS: 4.0000 mg | ORAL_TABLET | Freq: Four times a day (QID) | ORAL | Status: DC | PRN

## 2022-11-06 MED ORDER — PREGABALIN 75 MG PO CAPS
150.0000 mg | ORAL_CAPSULE | Freq: Two times a day (BID) | ORAL | Status: DC
  Administered 2022-11-06 – 2022-11-07 (×2): 150 mg via ORAL
  Filled 2022-11-06 (×2): qty 2

## 2022-11-06 MED ORDER — MEPERIDINE HCL 25 MG/ML IJ SOLN: 6.2500 mg | INTRAMUSCULAR | Status: DC | PRN

## 2022-11-06 MED ORDER — DEXAMETHASONE SODIUM PHOSPHATE 10 MG/ML IJ SOLN
INTRAMUSCULAR | Status: DC | PRN
  Administered 2022-11-06: 8 mg via INTRAVENOUS

## 2022-11-06 MED ORDER — CEFAZOLIN SODIUM-DEXTROSE 2-4 GM/100ML-% IV SOLN
2.0000 g | Freq: Three times a day (TID) | INTRAVENOUS | Status: AC
  Administered 2022-11-06 – 2022-11-07 (×2): 2 g via INTRAVENOUS
  Filled 2022-11-06 (×2): qty 100

## 2022-11-06 MED ORDER — ONDANSETRON HCL 4 MG/2ML IJ SOLN
INTRAMUSCULAR | Status: AC
  Filled 2022-11-06: qty 2

## 2022-11-06 MED ORDER — HYDROCHLOROTHIAZIDE 12.5 MG PO TABS
12.5000 mg | ORAL_TABLET | Freq: Two times a day (BID) | ORAL | Status: DC
  Administered 2022-11-06 – 2022-11-07 (×2): 12.5 mg via ORAL
  Filled 2022-11-06 (×2): qty 1

## 2022-11-06 MED ORDER — SODIUM CHLORIDE 0.9% FLUSH: 3.0000 mL | Freq: Two times a day (BID) | INTRAVENOUS | Status: DC

## 2022-11-06 MED ORDER — THROMBIN 5000 UNITS EX SOLR
OROMUCOSAL | Status: DC | PRN
  Administered 2022-11-06: 5 mL via TOPICAL

## 2022-11-06 MED ORDER — HYDROMORPHONE HCL 1 MG/ML IJ SOLN: 0.5000 mg | INTRAMUSCULAR | Status: DC | PRN

## 2022-11-06 MED ORDER — ONDANSETRON HCL 4 MG/2ML IJ SOLN
INTRAMUSCULAR | Status: DC | PRN
  Administered 2022-11-06: 4 mg via INTRAVENOUS

## 2022-11-06 MED ORDER — CHLORHEXIDINE GLUCONATE CLOTH 2 % EX PADS: 6.0000 | MEDICATED_PAD | Freq: Once | CUTANEOUS | Status: DC

## 2022-11-06 MED ORDER — ORAL CARE MOUTH RINSE: 15.0000 mL | Freq: Once | OROMUCOSAL | Status: AC

## 2022-11-06 MED ORDER — ACETAMINOPHEN 160 MG/5ML PO SOLN: 325.0000 mg | Freq: Once | ORAL | Status: DC | PRN

## 2022-11-06 MED ORDER — PHENYLEPHRINE HCL-NACL 20-0.9 MG/250ML-% IV SOLN
INTRAVENOUS | Status: DC | PRN
  Administered 2022-11-06: 40 ug/min via INTRAVENOUS

## 2022-11-06 MED ORDER — HYDROCODONE-ACETAMINOPHEN 10-325 MG PO TABS: 2.0000 | ORAL_TABLET | ORAL | Status: DC | PRN

## 2022-11-06 MED ORDER — FENTANYL CITRATE (PF) 250 MCG/5ML IJ SOLN
INTRAMUSCULAR | Status: DC | PRN
  Administered 2022-11-06: 25 ug via INTRAVENOUS
  Administered 2022-11-06 (×3): 50 ug via INTRAVENOUS

## 2022-11-06 MED ORDER — EPHEDRINE 5 MG/ML INJ
INTRAVENOUS | Status: AC
  Filled 2022-11-06: qty 5

## 2022-11-06 MED ORDER — INSULIN ASPART 100 UNIT/ML IJ SOLN: 0.0000 [IU] | Freq: Every day | INTRAMUSCULAR | Status: DC

## 2022-11-06 MED ORDER — HYDROMORPHONE HCL 1 MG/ML IJ SOLN
0.2500 mg | INTRAMUSCULAR | Status: DC | PRN
  Administered 2022-11-06 (×2): 0.5 mg via INTRAVENOUS

## 2022-11-06 MED ORDER — SODIUM CHLORIDE 0.9% FLUSH: 3.0000 mL | INTRAVENOUS | Status: DC | PRN

## 2022-11-06 MED ORDER — ONDANSETRON HCL 4 MG/2ML IJ SOLN
4.0000 mg | Freq: Four times a day (QID) | INTRAMUSCULAR | Status: DC | PRN
  Administered 2022-11-06: 4 mg via INTRAVENOUS
  Filled 2022-11-06: qty 2

## 2022-11-06 MED ORDER — BUPIVACAINE LIPOSOME 1.3 % IJ SUSP
INTRAMUSCULAR | Status: AC
  Filled 2022-11-06: qty 20

## 2022-11-06 MED ORDER — FENTANYL CITRATE (PF) 250 MCG/5ML IJ SOLN
INTRAMUSCULAR | Status: AC
  Filled 2022-11-06: qty 5

## 2022-11-06 MED ORDER — AMISULPRIDE (ANTIEMETIC) 5 MG/2ML IV SOLN: 10.0000 mg | Freq: Once | INTRAVENOUS | Status: DC | PRN

## 2022-11-06 MED ORDER — SODIUM CHLORIDE 0.9 % IV SOLN
250.0000 mL | INTRAVENOUS | Status: DC
  Administered 2022-11-06: 250 mL via INTRAVENOUS

## 2022-11-06 MED ORDER — THROMBIN 5000 UNITS EX SOLR
CUTANEOUS | Status: AC
  Filled 2022-11-06: qty 15000

## 2022-11-06 MED ORDER — METHYLPREDNISOLONE ACETATE 80 MG/ML IJ SUSP
INTRAMUSCULAR | Status: AC
  Filled 2022-11-06: qty 1

## 2022-11-06 MED ORDER — DOCUSATE SODIUM 100 MG PO CAPS
100.0000 mg | ORAL_CAPSULE | Freq: Two times a day (BID) | ORAL | Status: DC
  Administered 2022-11-06 – 2022-11-07 (×2): 100 mg via ORAL
  Filled 2022-11-06 (×2): qty 1

## 2022-11-06 MED ORDER — FENTANYL CITRATE (PF) 100 MCG/2ML IJ SOLN
INTRAMUSCULAR | Status: AC
  Filled 2022-11-06: qty 2

## 2022-11-06 MED ORDER — 0.9 % SODIUM CHLORIDE (POUR BTL) OPTIME
TOPICAL | Status: DC | PRN
  Administered 2022-11-06: 1000 mL

## 2022-11-06 MED ORDER — PHENYLEPHRINE 80 MCG/ML (10ML) SYRINGE FOR IV PUSH (FOR BLOOD PRESSURE SUPPORT)
PREFILLED_SYRINGE | INTRAVENOUS | Status: AC
  Filled 2022-11-06: qty 10

## 2022-11-06 MED ORDER — MICROFIBRILLAR COLL HEMOSTAT EX POWD
CUTANEOUS | Status: DC | PRN
  Administered 2022-11-06: 5 g via TOPICAL

## 2022-11-06 MED ORDER — ESMOLOL HCL 100 MG/10ML IV SOLN
INTRAVENOUS | Status: DC | PRN
  Administered 2022-11-06: 20 mg via INTRAVENOUS

## 2022-11-06 MED ORDER — PHENYLEPHRINE 80 MCG/ML (10ML) SYRINGE FOR IV PUSH (FOR BLOOD PRESSURE SUPPORT)
PREFILLED_SYRINGE | INTRAVENOUS | Status: DC | PRN
  Administered 2022-11-06 (×4): 80 ug via INTRAVENOUS

## 2022-11-06 MED ORDER — ACETAMINOPHEN 10 MG/ML IV SOLN
1000.0000 mg | Freq: Once | INTRAVENOUS | Status: DC | PRN
  Administered 2022-11-06: 1000 mg via INTRAVENOUS

## 2022-11-06 MED ORDER — CEFAZOLIN SODIUM-DEXTROSE 2-4 GM/100ML-% IV SOLN
2.0000 g | INTRAVENOUS | Status: AC
  Administered 2022-11-06: 2 g via INTRAVENOUS
  Filled 2022-11-06: qty 100

## 2022-11-06 MED ORDER — THROMBIN (RECOMBINANT) 5000 UNITS EX SOLR: CUTANEOUS | Status: DC | PRN

## 2022-11-06 MED ORDER — ACETAMINOPHEN 10 MG/ML IV SOLN
INTRAVENOUS | Status: AC
  Filled 2022-11-06: qty 100

## 2022-11-06 MED ORDER — KETOROLAC TROMETHAMINE 15 MG/ML IJ SOLN
7.5000 mg | Freq: Four times a day (QID) | INTRAMUSCULAR | Status: DC
  Administered 2022-11-06 – 2022-11-07 (×3): 7.5 mg via INTRAVENOUS
  Filled 2022-11-06 (×3): qty 1

## 2022-11-06 MED ORDER — METHOCARBAMOL 500 MG PO TABS
500.0000 mg | ORAL_TABLET | Freq: Four times a day (QID) | ORAL | Status: DC | PRN
  Administered 2022-11-06: 500 mg via ORAL
  Filled 2022-11-06: qty 1

## 2022-11-06 MED ORDER — HYDROMORPHONE HCL 1 MG/ML IJ SOLN
INTRAMUSCULAR | Status: AC
  Filled 2022-11-06: qty 1

## 2022-11-06 MED ORDER — ACETAMINOPHEN 325 MG PO TABS: 325.0000 mg | ORAL_TABLET | Freq: Once | ORAL | Status: DC | PRN

## 2022-11-06 MED ORDER — ACETAMINOPHEN 325 MG PO TABS: 650.0000 mg | ORAL_TABLET | ORAL | Status: DC | PRN

## 2022-11-06 MED ORDER — METHOCARBAMOL 1000 MG/10ML IJ SOLN: 500.0000 mg | Freq: Four times a day (QID) | INTRAVENOUS | Status: DC | PRN

## 2022-11-06 MED ORDER — INSULIN ASPART 100 UNIT/ML IJ SOLN: 0.0000 [IU] | INTRAMUSCULAR | Status: DC | PRN

## 2022-11-06 MED ORDER — ROCURONIUM BROMIDE 10 MG/ML (PF) SYRINGE
PREFILLED_SYRINGE | INTRAVENOUS | Status: AC
  Filled 2022-11-06: qty 10

## 2022-11-06 MED ORDER — ACETAMINOPHEN 650 MG RE SUPP: 650.0000 mg | RECTAL | Status: DC | PRN

## 2022-11-06 MED ORDER — MENTHOL 3 MG MT LOZG: 1.0000 | LOZENGE | OROMUCOSAL | Status: DC | PRN

## 2022-11-06 MED ORDER — LACTATED RINGERS IV SOLN: INTRAVENOUS | Status: DC

## 2022-11-06 MED ORDER — PROPOFOL 10 MG/ML IV BOLUS
INTRAVENOUS | Status: AC
  Filled 2022-11-06: qty 20

## 2022-11-06 MED ORDER — LIDOCAINE 2% (20 MG/ML) 5 ML SYRINGE
INTRAMUSCULAR | Status: DC | PRN
  Administered 2022-11-06: 100 mg via INTRAVENOUS

## 2022-11-06 MED ORDER — LISINOPRIL 10 MG PO TABS
10.0000 mg | ORAL_TABLET | Freq: Every day | ORAL | Status: DC
  Administered 2022-11-06 – 2022-11-07 (×2): 10 mg via ORAL
  Filled 2022-11-06 (×2): qty 1

## 2022-11-06 MED ORDER — NORTRIPTYLINE HCL 25 MG PO CAPS
75.0000 mg | ORAL_CAPSULE | Freq: Every day | ORAL | Status: DC
  Administered 2022-11-06: 75 mg via ORAL
  Filled 2022-11-06: qty 3

## 2022-11-06 MED ORDER — PHENOL 1.4 % MT LIQD: 1.0000 | OROMUCOSAL | Status: DC | PRN

## 2022-11-06 MED ORDER — SUGAMMADEX SODIUM 200 MG/2ML IV SOLN
INTRAVENOUS | Status: DC | PRN
  Administered 2022-11-06: 200 mg via INTRAVENOUS

## 2022-11-06 MED ORDER — INSULIN ASPART 100 UNIT/ML IJ SOLN: 0.0000 [IU] | Freq: Three times a day (TID) | INTRAMUSCULAR | Status: DC

## 2022-11-06 MED ORDER — LIDOCAINE-EPINEPHRINE (PF) 1 %-1:200000 IJ SOLN
INTRAMUSCULAR | Status: DC | PRN
  Administered 2022-11-06: 10 mL via SURGICAL_CAVITY

## 2022-11-06 MED ORDER — METHYLPREDNISOLONE ACETATE 80 MG/ML IJ SUSP
INTRAMUSCULAR | Status: DC | PRN
  Administered 2022-11-06: 1.5 mL

## 2022-11-06 MED ORDER — DEXAMETHASONE SODIUM PHOSPHATE 10 MG/ML IJ SOLN
INTRAMUSCULAR | Status: AC
  Filled 2022-11-06: qty 1

## 2022-11-06 MED ORDER — PROMETHAZINE HCL 25 MG/ML IJ SOLN: 6.2500 mg | INTRAMUSCULAR | Status: DC | PRN

## 2022-11-06 MED ORDER — ROCURONIUM BROMIDE 10 MG/ML (PF) SYRINGE
PREFILLED_SYRINGE | INTRAVENOUS | Status: DC | PRN
  Administered 2022-11-06: 20 mg via INTRAVENOUS
  Administered 2022-11-06: 60 mg via INTRAVENOUS

## 2022-11-06 SURGICAL SUPPLY — 55 items
BAG COUNTER SPONGE SURGICOUNT (BAG) ×2 IMPLANT
BAG SPNG CNTER NS LX DISP (BAG) ×1
BUR CARBIDE MATCH 3.0 (BURR) ×2 IMPLANT
CNTNR URN SCR LID CUP LEK RST (MISCELLANEOUS) ×2 IMPLANT
CONT SPEC 4OZ STRL OR WHT (MISCELLANEOUS) ×1
COVER MAYO STAND STRL (DRAPES) ×2 IMPLANT
DRAIN JACKSON RD 7FR 3/32 (WOUND CARE) IMPLANT
DRAPE C-ARM 42X72 X-RAY (DRAPES) ×2 IMPLANT
DRAPE LAPAROTOMY 100X72X124 (DRAPES) ×2 IMPLANT
DRAPE MICROSCOPE SLANT 54X150 (MISCELLANEOUS) ×2 IMPLANT
DRAPE SURG 17X23 STRL (DRAPES) ×2 IMPLANT
DURAPREP 26ML APPLICATOR (WOUND CARE) ×2 IMPLANT
ELECT BLADE INSULATED 4IN (ELECTROSURGICAL) ×1
ELECT COATED BLADE 2.86 ST (ELECTRODE) ×2 IMPLANT
ELECT REM PT RETURN 9FT ADLT (ELECTROSURGICAL) ×1
ELECTRODE BLADE INSULATED 4IN (ELECTROSURGICAL) ×2 IMPLANT
ELECTRODE REM PT RTRN 9FT ADLT (ELECTROSURGICAL) ×2 IMPLANT
EVACUATOR 1/8 PVC DRAIN (DRAIN) IMPLANT
GAUZE 4X4 16PLY ~~LOC~~+RFID DBL (SPONGE) IMPLANT
GAUZE SPONGE 4X4 12PLY STRL (GAUZE/BANDAGES/DRESSINGS) ×2 IMPLANT
GLOVE BIO SURGEON STRL SZ7 (GLOVE) ×2 IMPLANT
GLOVE BIOGEL PI IND STRL 7.5 (GLOVE) ×2 IMPLANT
GLOVE BIOGEL PI IND STRL 8 (GLOVE) ×2 IMPLANT
GLOVE ECLIPSE 8.0 STRL XLNG CF (GLOVE) ×4 IMPLANT
GOWN STRL REUS W/ TWL LRG LVL3 (GOWN DISPOSABLE) IMPLANT
GOWN STRL REUS W/ TWL XL LVL3 (GOWN DISPOSABLE) ×4 IMPLANT
GOWN STRL REUS W/TWL 2XL LVL3 (GOWN DISPOSABLE) IMPLANT
GOWN STRL REUS W/TWL LRG LVL3 (GOWN DISPOSABLE)
GOWN STRL REUS W/TWL XL LVL3 (GOWN DISPOSABLE) ×2
HEMOSTAT POWDER KIT SURGIFOAM (HEMOSTASIS) ×2 IMPLANT
KIT BASIN OR (CUSTOM PROCEDURE TRAY) ×2 IMPLANT
KIT POSITION SURG JACKSON T1 (MISCELLANEOUS) ×2 IMPLANT
KIT TURNOVER KIT B (KITS) ×2 IMPLANT
MARKER SKIN DUAL TIP RULER LAB (MISCELLANEOUS) ×2 IMPLANT
NDL HYPO 25X1 1.5 SAFETY (NEEDLE) ×2 IMPLANT
NEEDLE HYPO 25X1 1.5 SAFETY (NEEDLE) ×1 IMPLANT
NS IRRIG 1000ML POUR BTL (IV SOLUTION) ×2 IMPLANT
PACK LAMINECTOMY NEURO (CUSTOM PROCEDURE TRAY) ×2 IMPLANT
PAD ARMBOARD 7.5X6 YLW CONV (MISCELLANEOUS) ×6 IMPLANT
PATTIES SURGICAL .5 X.5 (GAUZE/BANDAGES/DRESSINGS) IMPLANT
PATTIES SURGICAL .5 X1 (DISPOSABLE) IMPLANT
PATTIES SURGICAL 1X1 (DISPOSABLE) IMPLANT
SOL ELECTROSURG ANTI STICK (MISCELLANEOUS)
SOLUTION ELECTROSURG ANTI STCK (MISCELLANEOUS) ×2 IMPLANT
SPIKE FLUID TRANSFER (MISCELLANEOUS) ×2 IMPLANT
SPONGE SURGIFOAM ABS GEL SZ50 (HEMOSTASIS) ×2 IMPLANT
SPONGE T-LAP 4X18 ~~LOC~~+RFID (SPONGE) IMPLANT
STAPLER VISISTAT 35W (STAPLE) IMPLANT
SUT VIC AB 0 CT1 18XCR BRD8 (SUTURE) ×2 IMPLANT
SUT VIC AB 0 CT1 8-18 (SUTURE) ×1
SUT VIC AB 2-0 CP2 18 (SUTURE) ×2 IMPLANT
SUT VIC AB 3-0 SH 8-18 (SUTURE) ×2 IMPLANT
TOWEL GREEN STERILE (TOWEL DISPOSABLE) ×2 IMPLANT
TOWEL GREEN STERILE FF (TOWEL DISPOSABLE) ×2 IMPLANT
WATER STERILE IRR 1000ML POUR (IV SOLUTION) ×2 IMPLANT

## 2022-11-06 NOTE — Transfer of Care (Signed)
Immediate Anesthesia Transfer of Care Note  Patient: Katherine [name]  Procedure(s) Performed: OPEN LUMBAR LAMINECTOMY, Lumbar two-three (Spine Lumbar)  Patient Location: PACU  Anesthesia Type:General  Level of Consciousness: awake, oriented, and drowsy  Airway & Oxygen Therapy: Patient Spontanous Breathing  Post-op Assessment: Report given to RN, Post -op Vital signs reviewed and stable, and Patient moving all extremities  Post vital signs: stable  Last Vitals:  Vitals Value Taken Time  BP 132/81 11/06/22 1505  Temp    Pulse 100 11/06/22 1507  Resp 37 11/06/22 1507  SpO2 96 % 11/06/22 1507  Vitals shown include unvalidated device data.  Last Pain:  Vitals:   11/06/22 1041  TempSrc:   PainSc: 8       Patients Stated Pain Goal: 3 (11/06/22 1041)  Complications: No notable events documented.

## 2022-11-06 NOTE — Anesthesia Procedure Notes (Signed)
Procedure Name: Intubation Date/Time: 11/06/2022 2:15 PM  Performed by: Kayleen Memos, CRNAPre-anesthesia Checklist: Patient identified, Emergency Drugs available, Suction available, Patient being monitored and Timeout performed Patient Re-evaluated:Patient Re-evaluated prior to induction Oxygen Delivery Method: Circle system utilized Preoxygenation: Pre-oxygenation with 100% oxygen Induction Type: IV induction Ventilation: Mask ventilation without difficulty Laryngoscope Size: Mac and 3 Grade View: Grade II Tube type: Oral Tube size: 7.0 mm Number of attempts: 1 Airway Equipment and Method: Stylet Placement Confirmation: ETT inserted through vocal cords under direct vision, positive ETCO2, breath sounds checked- equal and bilateral and CO2 detector Secured at: 21 cm Tube secured with: Tape Dental Injury: Teeth and Oropharynx as per pre-operative assessment  Comments: Patient intubated by paramedic student with patient's permission

## 2022-11-06 NOTE — Op Note (Signed)
  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 Kendell Bane C. Myles Tavella, DO   ASSISTANT: None   ANESTHESIA: General Endotracheal   EBL: 50 cc   SPECIMENS: None   DRAINS: None   COMPLICATIONS: None   CONDITION: Hemodynamically stable   HISTORY: Katherine [name] is a 72 y.o. female with complaints of progressively worsening neurogenic claudication.  MRI revealed severe canal stenosis due to ligamentum and facet hypertrophy at L2-3 with moderate spondylosis throughout the remainder of the lumbar spine.  She failed conservative measures and therefore I offered her surgical intervention in the form of an L2-3 open laminectomy for decompression.  We discussed all risks, benefits and expected outcomes as well as alternatives to treatment.  Informed consent was obtained and witnessed, this was also discussed in detail with her daughter.   PROCEDURE IN DETAIL: The patient was brought to the operating room. After induction of general anesthesia, the patient was positioned on the operative table in the prone position. All pressure points were meticulously padded. Skin incision was then marked out and prepped and draped in the usual sterile fashion.   Using a 10 blade, midline incision was created over the L2, L3 spinous processes.  Using Bovie electrocautery soft tissue dissection was performed down to the lumbodorsal fascia.  Subperiosteal dissection was performed bilaterally with Bovie electrocautery exposing the L2, L3 lamina bilaterally.  Deep retractors placed in the wound.  Lateral fluoroscopy confirmed the appropriate level.  The microscope was  sterilely draped and brought into the field.  Using Leksell rongeur, the spinous process of L2, L3 were removed down to the lamina.  Using a high-speed drill, the L2 lamina was removed bilaterally to the lateral recess down to the ligamentum flavum and superiorly up to the ligamentous attachment bilaterally.  Using high-speed drill, a partial bilateral facetectomy was performed down to the ligamentum flavum.  The superior portion of the L3 lamina was then removed to the epidural space with the high speed drill. The ligamentum flavum was then gently dissected from the epidural space and resected with a series of Kerrison rongeurs to the lateral recess bilaterally.  Using a ball-tipped probe, bilateral lateral recesses appeared decompressed.  The bilateral lateral recess were explored again with a ball-tipped probe and noted to be appropriately decompressed.  The thecal sac was pulsatile.  Bilateral foramen were also palpated with a ball-tipped probe and noted to be adequately decompressed.    A mixture of Depo-Medrol and fentanyl was placed in the epidural space with Avitene. Deep retractor was taken out of the wound.  Hemostasis was achieved with bipolar cautery and the soft tissues.  The wound was closed in layers with 0 Vicryl sutures for muscle and fascia.  Dermis was closed with 2-0 and 3-0 Vicryl sutures.  Skin was closed with skin glue.  Sterile dressing was applied.   At the end of the case all sponge, needle, and instrument counts were correct. The patient was then transferred to the stretcher, extubated, and taken to the post-anesthesia care unit in stable hemodynamic condition.

## 2022-11-06 NOTE — Plan of Care (Signed)

## 2022-11-06 NOTE — H&P (Signed)
Providing Compassionate, Quality Care - Together  NEUROSURGERY HISTORY & PHYSICAL   Katherine [name] is an 72 y.o. female.   Chief Complaint: BLE claudication HPI: This is a 72 year old female with worsening complaints of bilateral lower extremity neurogenic claudication and low back pain.  MRI revealed moderate spondylosis throughout with severe stenosis at L2-3 due to ligamentum and facet hypertrophy.  She failed conservative measures and therefore presents today for surgical intervention in the form of an open L2-3 laminectomy for decompression.  Past Medical History:  Diagnosis Date   Cataract    LEFT,REMOVED   Complication of anesthesia     neuropathy in feet flares after surgery from PAS hose. needs cream on feet   DDD (degenerative disc disease), lumbar 2012   Dental crowns present    Depression    "LITTLE BIT"   Diabetes mellitus without complication (HCC)    GERD (gastroesophageal reflux disease)    occ, no medications at this time   History of anemia 1991   not since hysterectomy   History of gross hematuria 2002   with kidney stones   History of hyperparathyroidism    History of kidney stones    Hyperlipidemia    Hypertension    states under control with meds., has been on med. x 20 yr.   Impingement syndrome of right shoulder 2017   Lymphedema of both lower extremities    due to neuropathy   Lymphedema of both upper extremities    due to neuropathy   Obese    Osteoarthritis    Peripheral neuropathy    hands and feet   Sciatica    Triangular fibrocartilage complex tear 06/2017   right   Ulnar impaction syndrome, right 06/2017   Wears partial dentures    upper and lower    Past Surgical History:  Procedure Laterality Date   ABDOMINAL HYSTERECTOMY  1991   partial   CARPAL TUNNEL RELEASE Right 08/09/2002   CARPAL TUNNEL RELEASE Left 11/03/2006   CATARACT EXTRACTION Left 07/21/2021   COLONOSCOPY  01/2016   COLONOSCOPY WITH PROPOFOL  11/24/2011    CYSTOSCOPY WITH RETROGRADE PYELOGRAM, URETEROSCOPY AND STENT PLACEMENT Left 12/18/2014   Procedure: CYSTOSCOPY WITH RETROGRADE PYELOGRAM, URETEROSCOPY AND STENT PLACEMENT left ureter;  Surgeon: Heloise Purpura, MD;  Location: [facility];  Service: Urology;  Laterality: Left;   DILATION AND CURETTAGE OF UTERUS     ELBOW ARTHROSCOPY Right    EXTRACORPOREAL SHOCK WAVE LITHOTRIPSY Left 03/08/2017   Procedure: LEFT EXTRACORPOREAL SHOCK WAVE LITHOTRIPSY (ESWL);  Surgeon: Heloise Purpura, MD;  Location: [facility];  Service: Urology;  Laterality: Left;   HOLMIUM LASER APPLICATION Left 12/18/2014   Procedure: HOLMIUM LASER APPLICATION left ureter ;  Surgeon: Heloise Purpura, MD;  Location: [facility];  Service: Urology;  Laterality: Left;   PARATHYROIDECTOMY Right 05/03/2015   Procedure: RIGHT SUPERIOR PARATHYROIDECTOMY;  Surgeon: Darnell Level, MD;  Location: [facility];  Service: General;  Laterality: Right;   PATELLA REALIGNMENT Bilateral    as a teenager   ROTATOR CUFF REPAIR Right    x2   SHOULDER ARTHROSCOPY Right    x 2   SHOULDER ARTHROSCOPY W/ ROTATOR CUFF REPAIR Left 04/26/2002   SYNOVECTOMY WITH POLY EXCHANGE Left 11/28/2018   Procedure: SYNOVECTOMY WITH Levora Angel;  Surgeon: Dannielle Huh, MD;  Location: [facility];  Service: Orthopedics;  Laterality: Left;   TONSILLECTOMY     TOTAL KNEE ARTHROPLASTY Left 09/10/2003   TOTAL KNEE ARTHROPLASTY Right 11/10/2004   TOTAL KNEE ARTHROPLASTY  Right 05/02/2018   Procedure: Right polyethelene exchange and open synovectomy;  Surgeon: Dannielle Huh, MD;  Location: [facility];  Service: Orthopedics;  Laterality: Right;   TRIGGER FINGER RELEASE Right 08/06/2008   ring finger   TRIGGER FINGER RELEASE     multiple - right thumb, left index/long/ring/thumb   ULNA OSTEOTOMY Right 07/28/2017   Procedure: REVISION RIGHT ULNAR SHORTENING OSTEOTOMY;  Surgeon: Tarry Kos, MD;  Location: [facility];  Service: Orthopedics;  Laterality: Right;   WRIST ARTHROSCOPY Left  06/16/2000   WRIST ARTHROSCOPY WITH ULNA SHORTENING Right 07/07/2017   Procedure: RIGHT WRIST ARTHROSCOPY WITH ULNAR SHORTENING OSTEOTOMY;  Surgeon: Tarry Kos, MD;  Location: [facility];  Service: Orthopedics;  Laterality: Right;    Family History  Problem Relation Age of Onset   Stroke Mother    Cancer Mother 63       bone cancer   Other Father        unknown medical history   Diabetes Sister    Colon cancer Brother    Liver cancer Brother    Colon polyps Child    Crohn's disease Neg Hx    Esophageal cancer Neg Hx    Rectal cancer Neg Hx    Stomach cancer Neg Hx    Ulcerative colitis Neg Hx    Social History:  reports that she has never smoked. She has never used smokeless tobacco. She reports that she does not currently use alcohol. She reports that she does not use drugs.  Allergies:  Allergies  Allergen Reactions   Erythromycin Other (See Comments)    CHEST PAIN   Paroxetine Other (See Comments)    Repeated falls    Medications Prior to Admission  Medication Sig Dispense Refill   Cholecalciferol (VITAMIN D) 50 MCG (2000 UT) tablet Take 2,000 Units by mouth in the morning.     Cyanocobalamin (VITAMIN B-12 PO) Take 1 tablet by mouth 3 (three) times a week. In the morning.     cycloSPORINE (RESTASIS) 0.05 % ophthalmic emulsion Place 1 drop into both eyes 2 (two) times daily.     hydrochlorothiazide (HYDRODIURIL) 12.5 MG tablet Take 12.5 mg by mouth in the morning and at bedtime.     lidocaine (XYLOCAINE) 5 % ointment APPLY 1 APPLICATION TOPICALLY 3  TIMES DAILY AS NEEDED 425.28 g 0   lisinopril (ZESTRIL) 10 MG tablet TAKE 1 TABLET BY MOUTH  DAILY 90 tablet 3   Multiple Vitamin (MULTIVITAMIN WITH MINERALS) TABS tablet Take 1 tablet by mouth in the morning.     nortriptyline (PAMELOR) 25 MG capsule TAKE 3 CAPSULES BY MOUTH AT  BEDTIME 270 capsule 3   pregabalin (LYRICA) 150 MG capsule TAKE 1 CAPSULE BY MOUTH TWICE  DAILY 60 capsule 5   fexofenadine  (ALLEGRA ALLERGY) 60 MG tablet Take 1 tablet (60 mg total) by mouth daily. (Patient not taking: Reported on 10/23/2022) 30 tablet 1   fluticasone (FLONASE) 50 MCG/ACT nasal spray Place 1 spray into both nostrils daily. (Patient not taking: Reported on 10/23/2022) 15.8 mL 2   lidocaine-prilocaine (EMLA) cream Apply 1 application topically to affected areas 3 times daily as needed. (Patient not taking: Reported on 10/23/2022) 360 g 11   traMADol (ULTRAM) 50 MG tablet Take one to two tablets by mouth every 6 hours if needed for pain, Prescribed by Neurology 180 tablet 0    Results for orders placed or performed during the hospital encounter of 11/06/22 (from the past 48  hour(s))  Glucose, capillary     Status: Abnormal   Collection Time: 11/06/22 10:26 AM  Result Value Ref Range   Glucose-Capillary 142 (H) 70 - 99 mg/dL    Comment: Glucose reference range applies only to samples taken after fasting for at least 8 hours.   Comment 1 Notify RN   I-STAT, chem 8     Status: Abnormal   Collection Time: 11/06/22 10:47 AM  Result Value Ref Range   Sodium 137 135 - 145 mmol/L   Potassium 4.6 3.5 - 5.1 mmol/L   Chloride 104 98 - 111 mmol/L   BUN 10 8 - 23 mg/dL   Creatinine, Ser 1.61 0.44 - 1.00 mg/dL   Glucose, Bld 096 (H) 70 - 99 mg/dL    Comment: Glucose reference range applies only to samples taken after fasting for at least 8 hours.   Calcium, Ion 1.15 1.15 - 1.40 mmol/L   TCO2 26 22 - 32 mmol/L   Hemoglobin 13.9 12.0 - 15.0 g/dL   HCT 04.5 40.9 - 81.1 %   No results found.  ROS All pertinent positives and negatives are listed in HPI above  Blood pressure (!) 149/81, pulse (!) 116, temperature 98.2 F (36.8 C), temperature source Oral, resp. rate 18, height 5\' 4"  (1.626 m), weight 114.8 kg, SpO2 99 %. Physical Exam  Awake alert oriented x 3, no acute distress Nonlabored breathing PERRLA Cranial nerves II through XII intact Bilateral upper and lower extremities 5/5 throughout Speech  fluent appropriate   Assessment/Plan 72 year old female with  L2-3 severe canal stenosis, multifactorial with neurogenic claudication  -OR today for L2-3 open laminectomy for decompression.  We discussed all risks, benefits expected outcomes as well as alternatives to treatment.  Informed consent was obtained and witnessed.  I answered all the questions.   Thank you for allowing me to participate in this patient's care.  Please do not hesitate to call with questions or concerns.   Monia Pouch, DO Neurosurgeon [facility] Cell: [phone]

## 2022-11-07 DIAGNOSIS — M48062 Spinal stenosis, lumbar region with neurogenic claudication: Secondary | ICD-10-CM | POA: Diagnosis not present

## 2022-11-07 LAB — GLUCOSE, CAPILLARY: Glucose-Capillary: 122 mg/dL — ABNORMAL HIGH (ref 70–99)

## 2022-11-07 MED ORDER — HYDROCODONE-ACETAMINOPHEN 5-325 MG PO TABS: 1.0000 | ORAL_TABLET | ORAL | 0 refills | Status: DC | PRN

## 2022-11-07 NOTE — Discharge Summary (Signed)
Physician Discharge Summary  Patient ID: Katherine [name] MRN: [id number] DOB/AGE: 1950/07/08 72 y.o.  Admit date: 11/06/2022 Discharge date: 11/07/2022  Admission Diagnoses: Lumbar spinal stenosis   Discharge Diagnoses: Same   Discharged Condition: Good  Hospital Course: The patient was admitted on 11/06/2022 and taken to the operating room where the patient underwent lumbar laminectomy for stenosis. The patient tolerated the procedure well and was taken to the recovery room and then to the floor in stable condition. The hospital course was routine. There were no complications. The wound remained clean dry and intact. Pt had appropriate back soreness. No complaints of leg pain or new N/T/W. The patient remained afebrile with stable vital signs, and tolerated a regular diet. The patient continued to increase activities, and pain was well controlled with oral pain medications.   Consults: None  Significant Diagnostic Studies:  Results for orders placed or performed during the hospital encounter of 11/06/22  Glucose, capillary  Result Value Ref Range   Glucose-Capillary 142 (H) 70 - 99 mg/dL   Comment 1 Notify RN   Glucose, capillary  Result Value Ref Range   Glucose-Capillary 137 (H) 70 - 99 mg/dL  Glucose, capillary  Result Value Ref Range   Glucose-Capillary 150 (H) 70 - 99 mg/dL   Comment 1 Notify RN    Comment 2 Document in Chart   Glucose, capillary  Result Value Ref Range   Glucose-Capillary 143 (H) 70 - 99 mg/dL   Comment 1 Notify RN    Comment 2 Document in Chart   Glucose, capillary  Result Value Ref Range   Glucose-Capillary 122 (H) 70 - 99 mg/dL   Comment 1 Notify RN    Comment 2 Document in Chart   I-STAT, chem 8  Result Value Ref Range   Sodium 137 135 - 145 mmol/L   Potassium 4.6 3.5 - 5.1 mmol/L   Chloride 104 98 - 111 mmol/L   BUN 10 8 - 23 mg/dL   Creatinine, Ser 4.09 0.44 - 1.00 mg/dL   Glucose, Bld 811 (H) 70 - 99 mg/dL   Calcium, Ion 9.14 7.82 -  1.40 mmol/L   TCO2 26 22 - 32 mmol/L   Hemoglobin 13.9 12.0 - 15.0 g/dL   HCT 95.6 21.3 - 08.6 %    DG Lumbar Spine 1 View  Result Date: 11/06/2022 CLINICAL DATA:  Laminectomy EXAM: LUMBAR SPINE - 1 VIEW COMPARISON:  08/27/2022 MRI lumbar spine FINDINGS: One fluoroscopic image obtained during the procedure and provided for interpretation. The image demonstrates instrumentation posterior to the L3 vertebral body. Fluoroscopy time: 10.3 seconds Cumulative air kerma: 7.32 mGy IMPRESSION: Fluoroscopic guidance provided for lumbar spine surgery. Electronically Signed   By: Wiliam Ke M.D.   On: 11/06/2022 15:43   DG C-Arm 1-60 Min-No Report  Result Date: 11/06/2022 Fluoroscopy was utilized by the requesting physician.  No radiographic interpretation.   DG C-Arm 1-60 Min-No Report  Result Date: 11/06/2022 Fluoroscopy was utilized by the requesting physician.  No radiographic interpretation.    Antibiotics:  Anti-infectives (From admission, onward)    Start     Dose/Rate Route Frequency Ordered Stop   11/06/22 2100  ceFAZolin (ANCEF) IVPB 2g/100 mL premix        2 g 200 mL/hr over 30 Minutes Intravenous Every 8 hours 11/06/22 1713 11/07/22 0754   11/06/22 1030  ceFAZolin (ANCEF) IVPB 2g/100 mL premix        2 g 200 mL/hr over 30 Minutes Intravenous On call to O.R.  11/06/22 1021 11/06/22 1303       Discharge Exam: Blood pressure (!) 94/55, pulse (!) 106, temperature 98.3 F (36.8 C), temperature source Oral, resp. rate 20, height 5\' 4"  (1.626 m), weight 114.8 kg, SpO2 97 %. Neurologic: Grossly normal Dressing dry  Discharge Medications:   Allergies as of 11/07/2022       Reactions   Erythromycin Other (See Comments)   CHEST PAIN   Paroxetine Other (See Comments)   Repeated falls        Medication List     STOP taking these medications    traMADol 50 MG tablet Commonly known as: ULTRAM       TAKE these medications    cycloSPORINE 0.05 % ophthalmic  emulsion Commonly known as: RESTASIS Place 1 drop into both eyes 2 (two) times daily.   fexofenadine 60 MG tablet Commonly known as: Allegra Allergy Take 1 tablet (60 mg total) by mouth daily.   fluticasone 50 MCG/ACT nasal spray Commonly known as: FLONASE Place 1 spray into both nostrils daily.   hydrochlorothiazide 12.5 MG tablet Commonly known as: HYDRODIURIL Take 12.5 mg by mouth in the morning and at bedtime.   HYDROcodone-acetaminophen 5-325 MG tablet Commonly known as: NORCO/VICODIN Take 1 tablet by mouth every 4 (four) hours as needed for moderate pain ((score 4 to 6)).   lidocaine 5 % ointment Commonly known as: XYLOCAINE APPLY 1 APPLICATION TOPICALLY 3  TIMES DAILY AS NEEDED   lidocaine-prilocaine cream Commonly known as: EMLA Apply 1 application topically to affected areas 3 times daily as needed.   lisinopril 10 MG tablet Commonly known as: ZESTRIL TAKE 1 TABLET BY MOUTH  DAILY   multivitamin with minerals Tabs tablet Take 1 tablet by mouth in the morning.   nortriptyline 25 MG capsule Commonly known as: PAMELOR TAKE 3 CAPSULES BY MOUTH AT  BEDTIME   pregabalin 150 MG capsule Commonly known as: LYRICA TAKE 1 CAPSULE BY MOUTH TWICE  DAILY   VITAMIN B-12 PO Take 1 tablet by mouth 3 (three) times a week. In the morning.   Vitamin D 50 MCG (2000 UT) tablet Take 2,000 Units by mouth in the morning.        Disposition: Home   Final Dx: Lumbar laminectomy for stenosis  Discharge Instructions     Call MD for:  difficulty breathing, headache or visual disturbances   Complete by: As directed    Call MD for:  persistant nausea and vomiting   Complete by: As directed    Call MD for:  redness, tenderness, or signs of infection (pain, swelling, redness, odor or green/yellow discharge around incision site)   Complete by: As directed    Call MD for:  severe uncontrolled pain   Complete by: As directed    Call MD for:  temperature >100.4   Complete by: As  directed    Diet - low sodium heart healthy   Complete by: As directed    Incentive spirometry RT   Complete by: As directed    Increase activity slowly   Complete by: As directed         Follow-up Information     Dawley, Troy C, DO. Call.   Why: As needed, If symptoms worsen Contact information: [address] [place] [place] [address] [phone]                  Signed: Tia Alert 11/07/2022, 9:06 AM

## 2022-11-07 NOTE — Care Management (Signed)
Patient with order to DC to home today. Unit staff to provide DME needed for home.   No HH needs identified Patient will have family/ friends provide transportation home. No other TOC needs identified for DC 

## 2022-11-07 NOTE — Progress Notes (Signed)
PT Cancellation Note  Patient Details Name: Katherine [name] MRN: [id number] DOB: [birth year]   Cancelled Treatment:    Reason Eval/Treat Not Completed: PT screened, no needs identified, will sign off. Per discussion with OT, pt is mobilizing at baseline level at this time and has no need for acute PT services prior to discharge. Acute PT signing off.   Arlyss Gandy 11/07/2022, 9:20 AM

## 2022-11-07 NOTE — Anesthesia Postprocedure Evaluation (Signed)
Anesthesia Post Note  Patient: Katherine [name]  Procedure(s) Performed: OPEN LUMBAR LAMINECTOMY, Lumbar two-three (Spine Lumbar)     Patient location during evaluation: PACU Anesthesia Type: General Level of consciousness: awake and alert Pain management: pain level controlled Vital Signs Assessment: post-procedure vital signs reviewed and stable Respiratory status: spontaneous breathing, nonlabored ventilation, respiratory function stable and patient connected to nasal cannula oxygen Cardiovascular status: blood pressure returned to baseline and stable Postop Assessment: no apparent nausea or vomiting Anesthetic complications: no   No notable events documented.               Shelton Silvas

## 2022-11-07 NOTE — Progress Notes (Signed)
Patient alert and oriented, mae's well, voiding adequate amount of urine, swallowing without difficulty, no c/o pain at time of discharge. Patient discharged home with family. Script and discharged instructions given to patient. Patient and daughter stated understanding of instructions given. Patient has an appointment with Dr. Jake Samples in 2 weeks

## 2022-11-07 NOTE — Evaluation (Signed)
Occupational Therapy Evaluation Patient Details Name: Katherine [name] MRN: [id number] DOB: Jul 15, 1950 Today's Date: 11/07/2022   History of Present Illness Katherine [name] is a 72 y.o. female with complaints of worsening neurogenic claudication.  MRI revealed severe canal stenosis due to ligamentum and facet hypertrophy at L2-3 with moderate spondylosis throughout the remainder of the lumbar spine. Open bilateral L2, L3 laminectomy, medial facetectomies for decompression neural elements performed 11/06/22. PMHx: DDD, DM, HLD, HTN, OA, sciatica.   Clinical Impression   Katherine [name] was evaluated s/p the above spine surgery. She is mod I at baseline with use of rollator, she lives with her brother, drives and is indep with IADLs. Upon evaluation pt was limited by back precautions, pain, decreased ROM, generalized weakness and decreased activity tolerance. Overall she needed up to min G for mobility with rollator, and up to mod A for LB ADLs due to limited AROM. Provided cues and education on spinal precautions and compensatory techniques throughout, handout provided and pt demonstrated fair recall during ADLs and mobility; needing a lot of cues. Pt does not require further acute OT services. Recommend d/c home with support of family.        Recommendations for follow up therapy are one component of a multi-disciplinary discharge planning process, led by the attending physician.  Recommendations may be updated based on patient status, additional functional criteria and insurance authorization.   Assistance Recommended at Discharge Frequent or constant Supervision/Assistance  Patient can return home with the following A little help with walking and/or transfers;A little help with bathing/dressing/bathroom;Assistance with cooking/housework;Assist for transportation;Help with stairs or ramp for entrance    Functional Status Assessment  Patient has had a recent decline in their functional status and  demonstrates the ability to make significant improvements in function in a reasonable and predictable amount of time.  Equipment Recommendations  Other (comment) (Reacher and sock aide, pt instructed to purchase outside of the hospital.)    Recommendations for Other Services       Precautions / Restrictions Precautions Precautions: Fall;Back Precaution Booklet Issued: Yes (comment) Precaution Comments: no brace Restrictions Weight Bearing Restrictions: No      Mobility Bed Mobility Overal bed mobility: Needs Assistance Bed Mobility: Rolling, Sidelying to Sit Rolling: Supervision Sidelying to sit: Supervision       General bed mobility comments: cues for log roll    Transfers Overall transfer level: Needs assistance Equipment used: Rollator (4 wheels) Transfers: Sit to/from Stand Sit to Stand: Supervision, From elevated surface                  Balance Overall balance assessment: Needs assistance Sitting-balance support: Feet supported Sitting balance-Leahy Scale: Good     Standing balance support: Single extremity supported, During functional activity Standing balance-Leahy Scale: Fair                             ADL either performed or assessed with clinical judgement   ADL Overall ADL's : Needs assistance/impaired Eating/Feeding: Independent   Grooming: Supervision/safety;Standing   Upper Body Bathing: Set up;Sitting   Lower Body Bathing: Moderate assistance;Sit to/from stand   Upper Body Dressing : Set up;Sitting   Lower Body Dressing: Minimal assistance;Sit to/from stand   Toilet Transfer: Supervision/safety;Ambulation;Rollator (4 wheels)   Toileting- Clothing Manipulation and Hygiene: Sitting/lateral lean;Supervision/safety       Functional mobility during ADLs: Supervision/safety;Rollator (4 wheels) General ADL Comments: maximal cues needed to maintain back precautions. pt is unable to  obtain figure 4 position - AE education  provided to increased indep and safety     Vision Baseline Vision/History: 1 Wears glasses Vision Assessment?: No apparent visual deficits     Perception Perception Perception Tested?: No   Praxis Praxis Praxis tested?: Not tested    Pertinent Vitals/Pain Pain Assessment Pain Assessment: Faces Faces Pain Scale: Hurts a little bit Pain Location: back Pain Descriptors / Indicators: Discomfort Pain Intervention(s): Limited activity within patient's tolerance, Monitored during session     Hand Dominance Right   Extremity/Trunk Assessment Upper Extremity Assessment Upper Extremity Assessment: Generalized weakness (new pain in R elbow. hx of several UE surgeries)   Lower Extremity Assessment Lower Extremity Assessment: Generalized weakness   Cervical / Trunk Assessment Cervical / Trunk Assessment: Back Surgery   Communication Communication Communication: No difficulties   Cognition Arousal/Alertness: Awake/alert Behavior During Therapy: WFL for tasks assessed/performed Overall Cognitive Status: Within Functional Limits for tasks assessed                                 General Comments: needed cues to maintain back precuations     General Comments  VSS On RA            Home Living Family/patient expects to be discharged to:: Private residence Living Arrangements: Other relatives (brother) Available Help at Discharge: Family Type of Home: House Home Access: Stairs to enter Secretary/administrator of Steps: 1   Home Layout: One level     Bathroom Shower/Tub: Producer, television/film/video: Handicapped height Bathroom Accessibility: Yes   Home Equipment: Educational psychologist (4 wheels);Rolling Walker (2 wheels);Grab bars - tub/shower;Hand held shower head;Wheelchair - manual   Additional Comments: dtr planning on being with pt initially      Prior Functioning/Environment Prior Level of Function : Independent/Modified Independent              Mobility Comments: rollator for all mobility ADLs Comments: mod I, drives, completes IADLs        OT Problem List: Decreased strength;Decreased range of motion;Decreased activity tolerance;Impaired balance (sitting and/or standing);Decreased knowledge of use of DME or AE;Decreased safety awareness;Decreased knowledge of precautions;Pain         OT Goals(Current goals can be found in the care plan section) Acute Rehab OT Goals Patient Stated Goal: to walk OT Goal Formulation: With patient Time For Goal Achievement: 11/07/22 Potential to Achieve Goals: Good   AM-PAC OT "6 Clicks" Daily Activity     Outcome Measure Help from another person eating meals?: None Help from another person taking care of personal grooming?: A Little Help from another person toileting, which includes using toliet, bedpan, or urinal?: A Little Help from another person bathing (including washing, rinsing, drying)?: A Lot Help from another person to put on and taking off regular upper body clothing?: A Little Help from another person to put on and taking off regular lower body clothing?: A Lot 6 Click Score: 17   End of Session Equipment Utilized During Treatment: Rollator (4 wheels) Nurse Communication: Mobility status  Activity Tolerance: Patient tolerated treatment well Patient left: in chair;with call bell/phone within reach  OT Visit Diagnosis: Unsteadiness on feet (R26.81);Other abnormalities of gait and mobility (R26.89);Muscle weakness (generalized) (M62.81);Pain                Time: 4098-1191 OT Time Calculation (min): 24 min Charges:  OT General Charges $OT Visit: 1 Visit OT Evaluation $  OT Eval High Complexity: 1 High OT Treatments $Self Care/Home Management : 8-22 mins  Derenda Mis, OTR/L Acute Rehabilitation Services Office [phone] Secure Chat Communication Preferred   Donia Pounds 11/07/2022, 9:41 AM

## 2022-11-08 ENCOUNTER — Encounter (HOSPITAL_COMMUNITY): Payer: Self-pay | Admitting: Neurological Surgery

## 2022-11-09 ENCOUNTER — Telehealth: Payer: Self-pay

## 2022-11-09 NOTE — Telephone Encounter (Signed)
Unsuccessful attempt to reach patient on preferred number listed in notes for TOC. F/U

## 2022-11-10 MED FILL — Thrombin For Soln 5000 Unit: CUTANEOUS | Qty: 2 | Status: AC

## 2022-11-30 ENCOUNTER — Ambulatory Visit (INDEPENDENT_AMBULATORY_CARE_PROVIDER_SITE_OTHER): Payer: Medicare Other | Admitting: Podiatry

## 2022-11-30 DIAGNOSIS — E1142 Type 2 diabetes mellitus with diabetic polyneuropathy: Secondary | ICD-10-CM

## 2022-11-30 DIAGNOSIS — M2042 Other hammer toe(s) (acquired), left foot: Secondary | ICD-10-CM

## 2022-11-30 DIAGNOSIS — M2041 Other hammer toe(s) (acquired), right foot: Secondary | ICD-10-CM

## 2022-11-30 NOTE — Progress Notes (Signed)

## 2022-12-14 ENCOUNTER — Ambulatory Visit (INDEPENDENT_AMBULATORY_CARE_PROVIDER_SITE_OTHER): Payer: Medicare Other | Admitting: Family Medicine

## 2022-12-14 ENCOUNTER — Encounter: Payer: Self-pay | Admitting: Family Medicine

## 2022-12-14 VITALS — BP 94/70 | HR 105 | Temp 98.6°F | Ht 64.0 in | Wt 250.4 lb

## 2022-12-14 DIAGNOSIS — E119 Type 2 diabetes mellitus without complications: Secondary | ICD-10-CM | POA: Diagnosis not present

## 2022-12-14 DIAGNOSIS — M48062 Spinal stenosis, lumbar region with neurogenic claudication: Secondary | ICD-10-CM

## 2022-12-14 DIAGNOSIS — Z9889 Other specified postprocedural states: Secondary | ICD-10-CM

## 2022-12-14 LAB — GLUCOSE, POCT (MANUAL RESULT ENTRY): POC Glucose: 116 mg/dl — AB (ref 70–99)

## 2022-12-14 NOTE — Progress Notes (Signed)
Established Patient Office Visit   Subjective  Patient ID: Katherine [name], female    DOB: [birth year]  Age: 72 y.o. MRN: [id number]  Chief Complaint  Patient presents with   Medical Management of Chronic Issues    Pt is here for 3 month f/u. Pt states she would like to check her A1c and BG as previous one at the hospital was high.     Patient is a 72 year old female seen for follow-up on chronic conditions and recent surgery.  Patient states she is feeling less pain since back surgery 11/06/2022 for open laminectomy L3-4.  Now able to stand up straight using rollator.  Patient has been unable to return to exercise/swimming as incision is still healing.  Has to work on strengthening legs and core.  Gets tired if does too much/back hurts.  Patient scheduled the appointment as she was told her A1c and blood sugar were elevated in the hospital?  Patient with history of diet-controlled diabetes.  Blood sugars have remained stable since being home.  Patient's daughter is cooking her low-carb meals.  Last hemoglobin A1c was 6.8% on 10/28/2022.    Past Medical History:  Diagnosis Date   Cataract    LEFT,REMOVED   Complication of anesthesia     neuropathy in feet flares after surgery from PAS hose. needs cream on feet   DDD (degenerative disc disease), lumbar 2012   Dental crowns present    Depression    "LITTLE BIT"   Diabetes mellitus without complication (HCC)    GERD (gastroesophageal reflux disease)    occ, no medications at this time   History of anemia 1991   not since hysterectomy   History of gross hematuria 2002   with kidney stones   History of hyperparathyroidism    History of kidney stones    Hyperlipidemia    Hypertension    states under control with meds., has been on med. x 20 yr.   Impingement syndrome of right shoulder 2017   Lymphedema of both lower extremities    due to neuropathy   Lymphedema of both upper extremities    due to neuropathy   Obese     Osteoarthritis    Peripheral neuropathy    hands and feet   Sciatica    Triangular fibrocartilage complex tear 06/2017   right   Ulnar impaction syndrome, right 06/2017   Wears partial dentures    upper and lower   Past Surgical History:  Procedure Laterality Date   ABDOMINAL HYSTERECTOMY  1991   partial   CARPAL TUNNEL RELEASE Right 08/09/2002   CARPAL TUNNEL RELEASE Left 11/03/2006   CATARACT EXTRACTION Left 07/21/2021   COLONOSCOPY  01/2016   COLONOSCOPY WITH PROPOFOL  11/24/2011   CYSTOSCOPY WITH RETROGRADE PYELOGRAM, URETEROSCOPY AND STENT PLACEMENT Left 12/18/2014   Procedure: CYSTOSCOPY WITH RETROGRADE PYELOGRAM, URETEROSCOPY AND STENT PLACEMENT left ureter;  Surgeon: Heloise Purpura, MD;  Location: [facility];  Service: Urology;  Laterality: Left;   DILATION AND CURETTAGE OF UTERUS     ELBOW ARTHROSCOPY Right    EXTRACORPOREAL SHOCK WAVE LITHOTRIPSY Left 03/08/2017   Procedure: LEFT EXTRACORPOREAL SHOCK WAVE LITHOTRIPSY (ESWL);  Surgeon: Heloise Purpura, MD;  Location: [facility];  Service: Urology;  Laterality: Left;   HOLMIUM LASER APPLICATION Left 12/18/2014   Procedure: HOLMIUM LASER APPLICATION left ureter ;  Surgeon: Heloise Purpura, MD;  Location: [facility];  Service: Urology;  Laterality: Left;   LUMBAR LAMINECTOMY/DECOMPRESSION MICRODISCECTOMY N/A 11/06/2022   Procedure: OPEN LUMBAR LAMINECTOMY,  Lumbar two-three;  Surgeon: Dawley, Alan Mulder, DO;  Location: [facility];  Service: Neurosurgery;  Laterality: N/A;   PARATHYROIDECTOMY Right 05/03/2015   Procedure: RIGHT SUPERIOR PARATHYROIDECTOMY;  Surgeon: Darnell Level, MD;  [facility]: [facility];  Service: General;  Laterality: Right;   PATELLA REALIGNMENT Bilateral    as a teenager   ROTATOR CUFF REPAIR Right    x2   SHOULDER ARTHROSCOPY Right    x 2   SHOULDER ARTHROSCOPY W/ ROTATOR CUFF REPAIR Left 04/26/2002   SYNOVECTOMY WITH POLY EXCHANGE Left 11/28/2018   Procedure: SYNOVECTOMY WITH Levora Angel;  Surgeon: Dannielle Huh, MD;  Location: [facility];  Service: Orthopedics;  Laterality: Left;   TONSILLECTOMY     TOTAL KNEE ARTHROPLASTY Left 09/10/2003   TOTAL KNEE ARTHROPLASTY Right 11/10/2004   TOTAL KNEE ARTHROPLASTY Right 05/02/2018   Procedure: Right polyethelene exchange and open synovectomy;  Surgeon: Dannielle Huh, MD;  Location: [facility];  Service: Orthopedics;  Laterality: Right;   TRIGGER FINGER RELEASE Right 08/06/2008   ring finger   TRIGGER FINGER RELEASE     multiple - right thumb, left index/long/ring/thumb   ULNA OSTEOTOMY Right 07/28/2017   Procedure: REVISION RIGHT ULNAR SHORTENING OSTEOTOMY;  Surgeon: Tarry Kos, MD;  Location: [facility];  Service: Orthopedics;  Laterality: Right;   WRIST ARTHROSCOPY Left 06/16/2000   WRIST ARTHROSCOPY WITH ULNA SHORTENING Right 07/07/2017   Procedure: RIGHT WRIST ARTHROSCOPY WITH ULNAR SHORTENING OSTEOTOMY;  Surgeon: Tarry Kos, MD;  Location: [facility];  Service: Orthopedics;  Laterality: Right;   Social History   Tobacco Use   Smoking status: Never   Smokeless tobacco: Never  Vaping Use   Vaping status: Never Used  Substance Use Topics   Alcohol use: Not Currently   Drug use: Never   Family History  Problem Relation Age of Onset   Stroke Mother    Cancer Mother 74       bone cancer   Other Father        unknown medical history   Diabetes Sister    Colon cancer Brother    Liver cancer Brother    Colon polyps Child    Crohn's disease Neg Hx    Esophageal cancer Neg Hx    Rectal cancer Neg Hx    Stomach cancer Neg Hx    Ulcerative colitis Neg Hx    Allergies  Allergen Reactions   Erythromycin Other (See Comments)    CHEST PAIN   Paroxetine Other (See Comments)    Repeated falls      ROS Negative unless stated above    Objective:     BP 94/70 (BP Location: Right Arm, Patient Position: Sitting, Cuff Size: Large)   Pulse (!) 105   Temp 98.6 F (37 C) (Oral)   Ht 5\' 4"  (1.626 m)   Wt 250 lb 6.4 oz (113.6 kg)    SpO2 94%   BMI 42.98 kg/m  BP Readings from Last 3 Encounters:  12/14/22 94/70  11/07/22 (!) 94/55  10/28/22 133/81   Wt Readings from Last 3 Encounters:  12/14/22 250 lb 6.4 oz (113.6 kg)  11/06/22 253 lb (114.8 kg)  10/28/22 253 lb 9.6 oz (115 kg)      Physical Exam Constitutional:      General: She is not in acute distress.    Appearance: Normal appearance.  HENT:     Head: Normocephalic and atraumatic.     Nose: Nose normal.  Mouth/Throat:     Mouth: Mucous membranes are moist.  Cardiovascular:     Rate and Rhythm: Normal rate and regular rhythm.     Heart sounds: Normal heart sounds. No murmur heard.    No gallop.  Pulmonary:     Effort: Pulmonary effort is normal. No respiratory distress.     Breath sounds: Normal breath sounds. No wheezing, rhonchi or rales.  Skin:    General: Skin is warm and dry.     Comments: Healing surgical incision midline low back with areas of eschar of inferior third.  No erythema, drainage, induration.  Neurological:     Mental Status: She is alert and oriented to person, place, and time.     Gait: Gait is intact.     Comments: Ambulating with rollator.      Results for orders placed or performed in visit on 12/14/22  POC Glucose (CBG)  Result Value Ref Range   POC Glucose 116 (A) 70 - 99 mg/dl      Assessment & Plan:  Diet-controlled diabetes mellitus (HCC) -DM controlled.  Hemoglobin A1c was 6.8% on 10/28/2022 -FSBS 116 this visit prior to pt eating -Continue lifestyle modifications -Restart exercise when given the okay by neurosurgery -Continue ACE I -     POCT glucose (manual entry)  S/P lumbar laminectomy -Continue follow-up with neurosurgery  Spinal stenosis, lumbar region with neurogenic claudication -Continue follow-up with neurosurgery -Continue using rollator to aid with ambulation   Return in about 4 months (around 04/15/2023), or if symptoms worsen or fail to improve.   Deeann Saint, MD

## 2022-12-19 ENCOUNTER — Other Ambulatory Visit: Payer: Self-pay | Admitting: Family Medicine

## 2022-12-29 ENCOUNTER — Other Ambulatory Visit: Payer: Self-pay | Admitting: Family Medicine

## 2023-01-06 LAB — HM MAMMOGRAPHY

## 2023-01-12 ENCOUNTER — Encounter: Payer: Self-pay | Admitting: Family Medicine

## 2023-01-29 ENCOUNTER — Ambulatory Visit (INDEPENDENT_AMBULATORY_CARE_PROVIDER_SITE_OTHER): Payer: Medicare Other

## 2023-01-29 VITALS — BP 118/60 | HR 90 | Temp 98.2°F | Ht 64.0 in | Wt 245.9 lb

## 2023-01-29 DIAGNOSIS — Z23 Encounter for immunization: Secondary | ICD-10-CM | POA: Diagnosis not present

## 2023-01-29 DIAGNOSIS — Z0000 Encounter for general adult medical examination without abnormal findings: Secondary | ICD-10-CM

## 2023-01-29 NOTE — Patient Instructions (Addendum)
[name] , Thank you for taking time to come for your Medicare Wellness Visit. I appreciate your ongoing commitment to your health goals. Please review the following plan we discussed and let me know if I can assist you in the future.   Referrals/Orders/Follow-Ups/Clinician Recommendations:   This is a list of the screening recommended for you and due dates:  Health Maintenance  Topic Date Due   Zoster (Shingles) Vaccine (1 of 2) 04/14/1999   COVID-19 Vaccine (4 - 2023-24 season) 01/10/2023   Hemoglobin A1C  04/29/2023   Yearly kidney health urinalysis for diabetes  06/16/2023   Complete foot exam   09/23/2023   Eye exam for diabetics  10/01/2023   Yearly kidney function blood test for diabetes  10/28/2023   Mammogram  01/06/2024   Medicare Annual Wellness Visit  01/29/2024   DTaP/Tdap/Td vaccine (3 - Td or Tdap) 03/01/2028   Pneumonia Vaccine  Completed   Flu Shot  Completed   DEXA scan (bone density measurement)  Completed   Hepatitis C Screening  Completed   HPV Vaccine  Aged Out   Colon Cancer Screening  Discontinued    Advanced directives: (In Chart) A copy of your advanced directives are scanned into your chart should your provider ever need it.  Next Medicare Annual Wellness Visit scheduled for next year: Yes

## 2023-01-29 NOTE — Progress Notes (Signed)
Subjective:   Katherine [name] is a 72 y.o. female who presents for Medicare Annual (Subsequent) preventive examination.  Visit Complete: In person  Patient Medicare AWV questionnaire was completed by the patient on 01/25/23; I have confirmed that all information answered by patient is correct and no changes since this date.  Cardiac Risk Factors include: advanced age (>72men, >81 women);dyslipidemia;obesity (BMI >30kg/m2)     Objective:    Today's Vitals   01/29/23 1407  BP: 118/60  Pulse: 90  Temp: 98.2 F (36.8 C)  TempSrc: Oral  SpO2: 96%  Weight: 245 lb 14.4 oz (111.5 kg)  Height: 5\' 4"  (1.626 m)   Body mass index is 42.21 kg/m.     01/29/2023    2:37 PM 10/28/2022    8:23 AM 01/13/2022   11:42 AM 01/10/2021    9:06 AM 12/28/2019    9:14 AM 11/28/2018    9:02 AM 05/02/2018    3:00 PM  Advanced Directives  Does Patient Have a Medical Advance Directive? Yes Yes Yes Yes Yes No Yes  Type of Estate agent of [place];Living will Healthcare Power of [place];Living will Healthcare Power of [place];Living will Healthcare Power of [place];Living will Healthcare Power of [place];Living will  Healthcare Power of [place];Living will  Does patient want to make changes to medical advance directive? No - Patient declined No - Patient declined   No - Patient declined  No - Patient declined  Copy of Healthcare Power of Attorney in Chart? Yes - validated most recent copy scanned in chart (See row information) No - copy requested Yes - validated most recent copy scanned in chart (See row information) No - copy requested Yes - validated most recent copy scanned in chart (See row information)  Yes - validated most recent copy scanned in chart (See row information)  Would patient like information on creating a medical advance directive?      No - Patient declined     Current Medications (verified) Outpatient Encounter Medications as of 01/29/2023  Medication Sig    Cholecalciferol (VITAMIN D) 50 MCG (2000 UT) tablet Take 2,000 Units by mouth in the morning.   Cyanocobalamin (VITAMIN B-12 PO) Take 1 tablet by mouth 3 (three) times a week. In the morning.   cycloSPORINE (RESTASIS) 0.05 % ophthalmic emulsion Place 1 drop into both eyes 2 (two) times daily.   fexofenadine (ALLEGRA ALLERGY) 60 MG tablet Take 1 tablet (60 mg total) by mouth daily. (Patient not taking: Reported on 10/23/2022)   fluticasone (FLONASE) 50 MCG/ACT nasal spray Place 1 spray into both nostrils daily. (Patient not taking: Reported on 10/23/2022)   hydrochlorothiazide (HYDRODIURIL) 12.5 MG tablet Take 12.5 mg by mouth in the morning and at bedtime.   lidocaine (XYLOCAINE) 5 % ointment APPLY 1 APPLICATION TOPICALLY 3  TIMES DAILY AS NEEDED   lidocaine-prilocaine (EMLA) cream Apply 1 application topically to affected areas 3 times daily as needed.   lisinopril (ZESTRIL) 10 MG tablet TAKE 1 TABLET BY MOUTH DAILY   Multiple Vitamin (MULTIVITAMIN WITH MINERALS) TABS tablet Take 1 tablet by mouth in the morning.   nortriptyline (PAMELOR) 25 MG capsule TAKE 3 CAPSULES BY MOUTH AT  BEDTIME   pregabalin (LYRICA) 150 MG capsule TAKE 1 CAPSULE BY MOUTH TWICE  DAILY   No facility-administered encounter medications on file as of 01/29/2023.    Allergies (verified) Erythromycin and Paroxetine   History: Past Medical History:  Diagnosis Date   Cataract    LEFT,REMOVED   Complication  of anesthesia     neuropathy in feet flares after surgery from PAS hose. needs cream on feet   DDD (degenerative disc disease), lumbar 2012   Dental crowns present    Depression    "LITTLE BIT"   Diabetes mellitus without complication (HCC)    GERD (gastroesophageal reflux disease)    occ, no medications at this time   History of anemia 1991   not since hysterectomy   History of gross hematuria 2002   with kidney stones   History of hyperparathyroidism    History of kidney stones    Hyperlipidemia     Hypertension    states under control with meds., has been on med. x 20 yr.   Impingement syndrome of right shoulder 2017   Lymphedema of both lower extremities    due to neuropathy   Lymphedema of both upper extremities    due to neuropathy   Obese    Osteoarthritis    Peripheral neuropathy    hands and feet   Sciatica    Triangular fibrocartilage complex tear 06/2017   right   Ulnar impaction syndrome, right 06/2017   Wears partial dentures    upper and lower   Past Surgical History:  Procedure Laterality Date   ABDOMINAL HYSTERECTOMY  1991   partial   CARPAL TUNNEL RELEASE Right 08/09/2002   CARPAL TUNNEL RELEASE Left 11/03/2006   CATARACT EXTRACTION Left 07/21/2021   COLONOSCOPY  01/2016   COLONOSCOPY WITH PROPOFOL  11/24/2011   CYSTOSCOPY WITH RETROGRADE PYELOGRAM, URETEROSCOPY AND STENT PLACEMENT Left 12/18/2014   Procedure: CYSTOSCOPY WITH RETROGRADE PYELOGRAM, URETEROSCOPY AND STENT PLACEMENT left ureter;  Surgeon: Heloise Purpura, MD;  Location: [facility];  Service: Urology;  Laterality: Left;   DILATION AND CURETTAGE OF UTERUS     ELBOW ARTHROSCOPY Right    EXTRACORPOREAL SHOCK WAVE LITHOTRIPSY Left 03/08/2017   Procedure: LEFT EXTRACORPOREAL SHOCK WAVE LITHOTRIPSY (ESWL);  Surgeon: Heloise Purpura, MD;  Location: [facility];  Service: Urology;  Laterality: Left;   HOLMIUM LASER APPLICATION Left 12/18/2014   Procedure: HOLMIUM LASER APPLICATION left ureter ;  Surgeon: Heloise Purpura, MD;  Location: [facility];  Service: Urology;  Laterality: Left;   LUMBAR LAMINECTOMY/DECOMPRESSION MICRODISCECTOMY N/A 11/06/2022   Procedure: OPEN LUMBAR LAMINECTOMY, Lumbar two-three;  Surgeon: Dawley, Alan Mulder, DO;  Location: [facility];  Service: Neurosurgery;  Laterality: N/A;   PARATHYROIDECTOMY Right 05/03/2015   Procedure: RIGHT SUPERIOR PARATHYROIDECTOMY;  Surgeon: Darnell Level, MD;  Location: [facility];  Service: General;  Laterality: Right;   PATELLA REALIGNMENT Bilateral    as a teenager   ROTATOR CUFF  REPAIR Right    x2   SHOULDER ARTHROSCOPY Right    x 2   SHOULDER ARTHROSCOPY W/ ROTATOR CUFF REPAIR Left 04/26/2002   SYNOVECTOMY WITH POLY EXCHANGE Left 11/28/2018   Procedure: SYNOVECTOMY WITH Levora Angel;  Surgeon: Dannielle Huh, MD;  Location: [facility];  Service: Orthopedics;  Laterality: Left;   TONSILLECTOMY     TOTAL KNEE ARTHROPLASTY Left 09/10/2003   TOTAL KNEE ARTHROPLASTY Right 11/10/2004   TOTAL KNEE ARTHROPLASTY Right 05/02/2018   Procedure: Right polyethelene exchange and open synovectomy;  Surgeon: Dannielle Huh, MD;  Location: [facility];  Service: Orthopedics;  Laterality: Right;   TRIGGER FINGER RELEASE Right 08/06/2008   ring finger   TRIGGER FINGER RELEASE     multiple - right thumb, left index/long/ring/thumb   ULNA OSTEOTOMY Right 07/28/2017   Procedure: REVISION RIGHT ULNAR SHORTENING OSTEOTOMY;  Surgeon: Tarry Kos, MD;  Location: [facility];  Service: Orthopedics;  Laterality: Right;   WRIST ARTHROSCOPY Left 06/16/2000   WRIST ARTHROSCOPY WITH ULNA SHORTENING Right 07/07/2017   Procedure: RIGHT WRIST ARTHROSCOPY WITH ULNAR SHORTENING OSTEOTOMY;  Surgeon: Tarry Kos, MD;  Location: [facility];  Service: Orthopedics;  Laterality: Right;   Family History  Problem Relation Age of Onset   Stroke Mother    Cancer Mother 14       bone cancer   Other Father        unknown medical history   Diabetes Sister    Colon cancer Brother    Liver cancer Brother    Colon polyps Child    Crohn's disease Neg Hx    Esophageal cancer Neg Hx    Rectal cancer Neg Hx    Stomach cancer Neg Hx    Ulcerative colitis Neg Hx    Social History   Socioeconomic History   Marital status: Single    Spouse name: Not on file   Number of children: 1   Years of education: some college   Highest education level: Associate degree: occupational, Scientist, product/process development, or vocational program  Occupational History   Occupation: Retired in 2011    Employer: [employer]   Tobacco Use   Smoking status: Never   Smokeless tobacco: Never  Vaping Use   Vaping status: Never Used  Substance and Sexual Activity   Alcohol use: Not Currently   Drug use: Never   Sexual activity: Never    Birth control/protection: Surgical  Other Topics Concern   Not on file  Social History Narrative   Patient is single and lives with her brother.    Patient is right handed.   Caffeine - no daily use.   Social Determinants of Health   Financial Resource Strain: Low Risk  (01/25/2023)   Overall Financial Resource Strain (CARDIA)    Difficulty of Paying Living Expenses: Not hard at all  Food Insecurity: No Food Insecurity (01/25/2023)   Hunger Vital Sign    Worried About Running Out of Food in the Last Year: Never true    Ran Out of Food in the Last Year: Never true  Transportation Needs: No Transportation Needs (01/25/2023)   PRAPARE - Administrator, Civil Service (Medical): No    Lack of Transportation (Non-Medical): No  Physical Activity: Insufficiently Active (01/25/2023)   Exercise Vital Sign    Days of Exercise per Week: 2 days    Minutes of Exercise per Session: 20 min  Stress: No Stress Concern Present (01/25/2023)   [employer] of Occupational Health - Occupational Stress Questionnaire    Feeling of Stress : Not at all  Social Connections: Moderately Integrated (01/25/2023)   Social Connection and Isolation Panel [NHANES]    Frequency of Communication with Friends and Family: More than three times a week    Frequency of Social Gatherings with Friends and Family: Once a week    Attends Religious Services: More than 4 times per year    Active Member of [employer] or Organizations: Yes    Attends Engineer, structural: More than 4 times per year    Marital Status: Never married    Tobacco Counseling Counseling given: Not Answered   Clinical Intake:  Pre-visit preparation completed: Yes  Pain : No/denies pain     BMI - recorded:  42.21 Nutritional Status: BMI > 30  Obese Nutritional Risks: None Diabetes: No  How often do you need  to have someone help you when you read instructions, pamphlets, or other written materials from your doctor or pharmacy?: 1 - Never  Interpreter Needed?: No  Information entered by :: Theresa Mulligan LPN   Activities of Daily Living    01/25/2023   10:13 AM 10/28/2022    8:26 AM  In your present state of health, do you have any difficulty performing the following activities:  Hearing? 0   Vision? 0   Difficulty concentrating or making decisions? 0   Walking or climbing stairs? 1   Comment Uses a walker   Dressing or bathing? 0   Doing errands, shopping? 1 0  Comment Family Advice worker and eating ? Y   Comment Family Assist   Using the Toilet? N   In the past six months, have you accidently leaked urine? N   Do you have problems with loss of bowel control? N   Managing your Medications? N   Managing your Finances? N   Housekeeping or managing your Housekeeping? Y   Comment Family Assist     Patient Care Team: Deeann Saint, MD as PCP - General (Family Medicine)  Indicate any recent Medical Services you may have received from other than [facility] providers in the past year (date may be approximate).     Assessment:   This is a routine wellness examination for [place].  Hearing/Vision screen Hearing Screening - Comments:: Denies hearing difficulties   Vision Screening - Comments:: Wears reading glasses - up to date with routine eye exams with Dr Clare Gandy    Goals Addressed               This Visit's Progress     Patient Stated (pt-stated)        I will continue to do lap swimming 3 days per week       Depression Screen    01/29/2023    2:28 PM 09/14/2022    9:05 AM 06/15/2022    9:35 AM 01/13/2022   11:43 AM 11/20/2021   10:43 AM 11/12/2021    8:53 AM 01/10/2021    9:08 AM  PHQ 2/9 Scores  PHQ - 2 Score 0 1 2 0 1 3 0  PHQ- 9 Score  2 3  5 5       Fall Risk    01/25/2023   10:13 AM 12/10/2022    8:43 AM 09/14/2022    9:05 AM 06/15/2022    9:35 AM 01/13/2022   11:42 AM  Fall Risk   Falls in the past year? 1 1 1 1 1   Comment     lost balance  Number falls in past yr: 1 1 1 1  0  Injury with Fall? 0 0 0 0 0  Risk for fall due to :   Other (Comment) Other (Comment) Medication side effect  Follow up   Falls evaluation completed Falls evaluation completed Falls evaluation completed;Education provided;Falls prevention discussed    MEDICARE RISK AT HOME: Medicare Risk at Home Any stairs in or around the home?: (P) No If so, are there any without handrails?: No Home free of loose throw rugs in walkways, pet beds, electrical cords, etc?: (P) Yes Adequate lighting in your home to reduce risk of falls?: (P) Yes Life alert?: (P) No Use of a cane, walker or w/c?: (P) Yes Grab bars in the bathroom?: (P) Yes Shower chair or bench in shower?: (P) Yes Elevated toilet seat or a handicapped toilet?: (P) Yes  TIMED UP AND GO:  Was the test performed?  Yes  Length of time to ambulate 10 feet: 10 sec Gait slow and steady with assistive device    Cognitive Function:        01/29/2023    2:37 PM 01/13/2022   11:44 AM 12/28/2019    9:21 AM  6CIT Screen  What Year? 0 points 0 points 0 points  What month? 0 points 0 points 0 points  What time? 0 points 0 points 0 points  Count back from 20 0 points 0 points 0 points  Months in reverse 0 points 0 points 0 points  Repeat phrase 0 points 2 points 2 points  Total Score 0 points 2 points 2 points    Immunizations Immunization History  Administered Date(s) Administered   Fluad Quad(high Dose 65+) 03/09/2019, 01/24/2020, 01/30/2021, 02/16/2022   Fluad Trivalent(High Dose 65+) 01/29/2023   Influenza Split 02/18/2011, 05/26/2013   Influenza, High Dose Seasonal PF 03/11/2016, 03/24/2017, 02/09/2018   Influenza,inj,Quad PF,6+ Mos 05/29/2014, 03/12/2015, 05/28/2015   Moderna Sars-Covid-2 Vaccination  06/02/2019, 07/07/2019, 03/30/2020   Pneumococcal Conjugate-13 05/31/2014   Pneumococcal Polysaccharide-23 06/22/2016   Td 01/09/2006   Tdap 03/01/2018   Zoster, Live 02/18/2011    TDAP status: Up to date  Flu Vaccine status: Completed at today's visit  Pneumococcal vaccine status: Up to date  Covid-19 vaccine status: Declined, Education has been provided regarding the importance of this vaccine but patient still declined. Advised may receive this vaccine at local pharmacy or Health Dept.or vaccine clinic. Aware to provide a copy of the vaccination record if obtained from local pharmacy or Health Dept. Verbalized acceptance and understanding.  Qualifies for Shingles Vaccine? Yes   Zostavax completed No   Shingrix Completed?: No.    Education has been provided regarding the importance of this vaccine. Patient has been advised to call insurance company to determine out of pocket expense if they have not yet received this vaccine. Advised may also receive vaccine at local pharmacy or Health Dept. Verbalized acceptance and understanding.  Screening Tests Health Maintenance  Topic Date Due   Zoster Vaccines- Shingrix (1 of 2) 04/14/1999   COVID-19 Vaccine (4 - 2023-24 season) 01/10/2023   HEMOGLOBIN A1C  04/29/2023   Diabetic kidney evaluation - Urine ACR  06/16/2023   FOOT EXAM  09/23/2023   OPHTHALMOLOGY EXAM  10/01/2023   Diabetic kidney evaluation - eGFR measurement  10/28/2023   MAMMOGRAM  01/06/2024   Medicare Annual Wellness (AWV)  01/29/2024   DTaP/Tdap/Td (3 - Td or Tdap) 03/01/2028   Pneumonia Vaccine 74+ Years old  Completed   INFLUENZA VACCINE  Completed   DEXA SCAN  Completed   Hepatitis C Screening  Completed   HPV VACCINES  Aged Out   Colonoscopy  Discontinued    Health Maintenance  Health Maintenance Due  Topic Date Due   Zoster Vaccines- Shingrix (1 of 2) 04/14/1999   COVID-19 Vaccine (4 - 2023-24 season) 01/10/2023      Mammogram status: Completed  01/06/23. Repeat every year  Bone Density status: Completed 01/30/21. Results reflect: Bone density results: NORMAL. Repeat every   years.    Additional Screening:  Hepatitis C Screening: does qualify; Completed 02/09/19  Vision Screening: Recommended annual ophthalmology exams for early detection of glaucoma and other disorders of the eye. Is the patient up to date with their annual eye exam?  Yes  Who is the provider or what is the name of the office in which the patient  attends annual eye exams? Dr Clare Gandy If pt is not established with a provider, would they like to be referred to a provider to establish care? No .   Dental Screening: Recommended annual dental exams for proper oral hygiene   Community Resource Referral / Chronic Care Management:  CRR required this visit?  No   CCM required this visit?  No     Plan:     I have personally reviewed and noted the following in the patient's chart:   Medical and social history Use of alcohol, tobacco or illicit drugs  Current medications and supplements including opioid prescriptions. Patient is not currently taking opioid prescriptions. Functional ability and status Nutritional status Physical activity Advanced directives List of other physicians Hospitalizations, surgeries, and ER visits in previous 12 months Vitals Screenings to include cognitive, depression, and falls Referrals and appointments  In addition, I have reviewed and discussed with patient certain preventive protocols, quality metrics, and best practice recommendations. A written personalized care plan for preventive services as well as general preventive health recommendations were provided to patient.     Tillie Rung, LPN   1/61/0960   After Visit Summary: Given  Nurse Notes: None

## 2023-03-12 ENCOUNTER — Other Ambulatory Visit: Payer: Self-pay | Admitting: Family Medicine

## 2023-04-14 ENCOUNTER — Telehealth: Payer: Self-pay | Admitting: Family Medicine

## 2023-04-14 DIAGNOSIS — G609 Hereditary and idiopathic neuropathy, unspecified: Secondary | ICD-10-CM

## 2023-04-14 MED ORDER — PREGABALIN 150 MG PO CAPS: 150.0000 mg | ORAL_CAPSULE | Freq: Two times a day (BID) | ORAL | 4 refills | Status: DC

## 2023-04-14 NOTE — Addendum Note (Signed)
Addended by: Philipp Deputy A on: 04/14/2023 01:42 PM   Modules accepted: Orders

## 2023-04-14 NOTE — Telephone Encounter (Signed)
Prescription Request  04/14/2023  LOV: 12/14/2022  What is the name of the medication or equipment? pregabalin (LYRICA) 150 MG capsule   Have you contacted your pharmacy to request a refill? Yes   Which pharmacy would you like this sent to?   [facility] - [place], [place] - [address] [place] [address] Phone: [phone] Fax: [phone]    Patient notified that their request is being sent to the clinical staff for review and that they should receive a response within 2 business days.   Please advise at Mobile [phone] (mobile)

## 2023-04-15 MED ORDER — PREGABALIN 150 MG PO CAPS
150.0000 mg | ORAL_CAPSULE | Freq: Two times a day (BID) | ORAL | 4 refills | Status: DC
Start: 2023-04-15 — End: 2023-09-03

## 2023-04-20 ENCOUNTER — Other Ambulatory Visit: Payer: Self-pay | Admitting: Family Medicine

## 2023-04-26 ENCOUNTER — Encounter: Payer: Self-pay | Admitting: Family Medicine

## 2023-04-26 ENCOUNTER — Ambulatory Visit (INDEPENDENT_AMBULATORY_CARE_PROVIDER_SITE_OTHER): Payer: Medicare Other | Admitting: Family Medicine

## 2023-04-26 VITALS — BP 126/72 | HR 114 | Temp 98.3°F | Ht 64.0 in | Wt 251.6 lb

## 2023-04-26 DIAGNOSIS — E119 Type 2 diabetes mellitus without complications: Secondary | ICD-10-CM | POA: Diagnosis not present

## 2023-04-26 DIAGNOSIS — I10 Essential (primary) hypertension: Secondary | ICD-10-CM

## 2023-04-26 DIAGNOSIS — G609 Hereditary and idiopathic neuropathy, unspecified: Secondary | ICD-10-CM

## 2023-04-26 DIAGNOSIS — R7303 Prediabetes: Secondary | ICD-10-CM

## 2023-04-26 MED ORDER — PREGABALIN 25 MG PO CAPS
25.0000 mg | ORAL_CAPSULE | Freq: Every day | ORAL | 3 refills | Status: DC
Start: 2023-04-26 — End: 2023-09-03

## 2023-04-26 NOTE — Progress Notes (Signed)
Established Patient Office Visit   Subjective  Patient ID: Katherine [name], female    DOB: [birth year]  Age: 72 y.o. MRN: [id number]  Chief Complaint  Patient presents with   Peripheral Neuropathy    Bilateral hands and feet and lower leg neuropathy, numbness, sharp shooting, pins and needles     Pt is a 72 yo female seen for f/u on chronic conditions.   Pt endorses acute flare of chronic idiopathic neuropathy x 3 wks.  Pain last Thursday was so bad pt was screaming.  Pain in hands and feet was >10/10.  Unsure if cold weather caused increased symptoms.  Pain a little better today.  Pt has also been in PT and swimming for exercise.  Using lidocaine 5% ointment in a.m., sometimes twice daily, Lyrica 150 mg BID, and nortriptyline 75 mg qhs.  In the past paroxetine caused falls and Gabapentin caused edema.  Pt declines further EMG/NCS.    Patient Active Problem List   Diagnosis Date Noted   Lumbar spinal stenosis 11/06/2022   Diet-controlled diabetes mellitus (HCC) 03/11/2022   Mixed hyperlipidemia 03/11/2022   Cataract of left eye 03/11/2022   Spinal stenosis, lumbar region with neurogenic claudication 02/05/2021   Prediabetes 11/06/2019   Peripheral neuropathy 02/09/2019   Chronic right-sided low back pain with right-sided sciatica 02/09/2019   S/P revision of total knee 11/28/2018   S/P total knee replacement 05/02/2018   Body mass index 40.0-44.9, adult (HCC) 10/22/2017   Closed displaced oblique fracture of shaft of ulna with malunion, subsequent encounter 07/28/2017   Ulnar impaction syndrome, right 07/07/2017   Arthritis of right wrist 07/07/2017   Pain in right wrist 06/01/2017   Trigger finger, right index finger 09/28/2016   Impaired glucose tolerance 09/09/2015   Hyperparathyroidism (HCC) 05/26/2013   Hereditary and idiopathic peripheral neuropathy 12/29/2012   Morbid obesity (HCC)    Menopausal symptoms 11/10/2011   DIVERTICULOSIS, COLON 11/02/2007   Dyslipidemia  03/09/2006   Essential hypertension 03/09/2006   NEPHROLITHIASIS, HX OF 03/09/2006   Past Medical History:  Diagnosis Date   Cataract    LEFT,REMOVED   Complication of anesthesia     neuropathy in feet flares after surgery from PAS hose. needs cream on feet   DDD (degenerative disc disease), lumbar 2012   Dental crowns present    Depression    "LITTLE BIT"   Diabetes mellitus without complication (HCC)    GERD (gastroesophageal reflux disease)    occ, no medications at this time   History of anemia 1991   not since hysterectomy   History of gross hematuria 2002   with kidney stones   History of hyperparathyroidism    History of kidney stones    Hyperlipidemia    Hypertension    states under control with meds., has been on med. x 20 yr.   Impingement syndrome of right shoulder 2017   Lymphedema of both lower extremities    due to neuropathy   Lymphedema of both upper extremities    due to neuropathy   Obese    Osteoarthritis    Peripheral neuropathy    hands and feet   Sciatica    Triangular fibrocartilage complex tear 06/2017   right   Ulnar impaction syndrome, right 06/2017   Wears partial dentures    upper and lower   Past Surgical History:  Procedure Laterality Date   ABDOMINAL HYSTERECTOMY  1991   partial   CARPAL TUNNEL RELEASE Right 08/09/2002   CARPAL TUNNEL RELEASE  Left 11/03/2006   CATARACT EXTRACTION Left 07/21/2021   COLONOSCOPY  01/2016   COLONOSCOPY WITH PROPOFOL  11/24/2011   CYSTOSCOPY WITH RETROGRADE PYELOGRAM, URETEROSCOPY AND STENT PLACEMENT Left 12/18/2014   Procedure: CYSTOSCOPY WITH RETROGRADE PYELOGRAM, URETEROSCOPY AND STENT PLACEMENT left ureter;  Surgeon: Heloise Purpura, MD;  Location: [facility];  Service: Urology;  Laterality: Left;   DILATION AND CURETTAGE OF UTERUS     ELBOW ARTHROSCOPY Right    EXTRACORPOREAL SHOCK WAVE LITHOTRIPSY Left 03/08/2017   Procedure: LEFT EXTRACORPOREAL SHOCK WAVE LITHOTRIPSY (ESWL);  Surgeon: Heloise Purpura,  MD;  Location: [facility];  Service: Urology;  Laterality: Left;   HOLMIUM LASER APPLICATION Left 12/18/2014   Procedure: HOLMIUM LASER APPLICATION left ureter ;  Surgeon: Heloise Purpura, MD;  Location: [facility];  Service: Urology;  Laterality: Left;   LUMBAR LAMINECTOMY/DECOMPRESSION MICRODISCECTOMY N/A 11/06/2022   Procedure: OPEN LUMBAR LAMINECTOMY, Lumbar two-three;  Surgeon: Dawley, Alan Mulder, DO;  Location: [facility];  Service: Neurosurgery;  Laterality: N/A;   PARATHYROIDECTOMY Right 05/03/2015   Procedure: RIGHT SUPERIOR PARATHYROIDECTOMY;  Surgeon: Darnell Level, MD;  Location: [facility];  Service: General;  Laterality: Right;   PATELLA REALIGNMENT Bilateral    as a teenager   ROTATOR CUFF REPAIR Right    x2   SHOULDER ARTHROSCOPY Right    x 2   SHOULDER ARTHROSCOPY W/ ROTATOR CUFF REPAIR Left 04/26/2002   SYNOVECTOMY WITH POLY EXCHANGE Left 11/28/2018   Procedure: SYNOVECTOMY WITH Levora Angel;  Surgeon: Dannielle Huh, MD;  Location: [facility];  Service: Orthopedics;  Laterality: Left;   TONSILLECTOMY     TOTAL KNEE ARTHROPLASTY Left 09/10/2003   TOTAL KNEE ARTHROPLASTY Right 11/10/2004   TOTAL KNEE ARTHROPLASTY Right 05/02/2018   Procedure: Right polyethelene exchange and open synovectomy;  Surgeon: Dannielle Huh, MD;  Location: [facility];  Service: Orthopedics;  Laterality: Right;   TRIGGER FINGER RELEASE Right 08/06/2008   ring finger   TRIGGER FINGER RELEASE     multiple - right thumb, left index/long/ring/thumb   ULNA OSTEOTOMY Right 07/28/2017   Procedure: REVISION RIGHT ULNAR SHORTENING OSTEOTOMY;  Surgeon: Tarry Kos, MD;  Location: [facility];  Service: Orthopedics;  Laterality: Right;   WRIST ARTHROSCOPY Left 06/16/2000   WRIST ARTHROSCOPY WITH ULNA SHORTENING Right 07/07/2017   Procedure: RIGHT WRIST ARTHROSCOPY WITH ULNAR SHORTENING OSTEOTOMY;  Surgeon: Tarry Kos, MD;  Location: [facility];  Service: Orthopedics;  Laterality: Right;   Social History    Tobacco Use   Smoking status: Never   Smokeless tobacco: Never  Vaping Use   Vaping status: Never Used  Substance Use Topics   Alcohol use: Not Currently   Drug use: Never   Family History  Problem Relation Age of Onset   Stroke Mother    Cancer Mother 78       bone cancer   Other Father        unknown medical history   Diabetes Sister    Colon cancer Brother    Liver cancer Brother    Colon polyps Child    Crohn's disease Neg Hx    Esophageal cancer Neg Hx    Rectal cancer Neg Hx    Stomach cancer Neg Hx    Ulcerative colitis Neg Hx    Allergies  Allergen Reactions   Erythromycin Other (See Comments)    CHEST PAIN   Paroxetine Other (See Comments)    Repeated falls      ROS Negative unless stated above  Objective:     BP 126/72 (BP Location: Right Arm, Patient Position: Sitting, Cuff Size: Large)   Pulse (!) 114   Temp 98.3 F (36.8 C) (Oral)   Ht 5\' 4"  (1.626 m)   Wt 251 lb 9.6 oz (114.1 kg)   SpO2 94%   BMI 43.19 kg/m  BP Readings from Last 3 Encounters:  04/26/23 126/72  01/29/23 118/60  12/14/22 94/70   Wt Readings from Last 3 Encounters:  04/26/23 251 lb 9.6 oz (114.1 kg)  01/29/23 245 lb 14.4 oz (111.5 kg)  12/14/22 250 lb 6.4 oz (113.6 kg)      Physical Exam Constitutional:      General: She is not in acute distress.    Appearance: Normal appearance.     Comments: Wearing copper compression gloves.  HENT:     Head: Normocephalic and atraumatic.     Nose: Nose normal.     Mouth/Throat:     Mouth: Mucous membranes are moist.  Cardiovascular:     Rate and Rhythm: Normal rate and regular rhythm.     Heart sounds: Normal heart sounds. No murmur heard.    No gallop.  Pulmonary:     Effort: Pulmonary effort is normal. No respiratory distress.     Breath sounds: Normal breath sounds. No wheezing, rhonchi or rales.  Skin:    General: Skin is warm and dry.  Neurological:     Mental Status: She is alert and oriented to person,  place, and time.     Comments: Using rollator.      No results found for any visits on 04/26/23.    Assessment & Plan:  Hereditary and idiopathic peripheral neuropathy -Acute on chronic  -Current treatments mildly effective.  Discussed increasing Lyrica from 150 to 175 mg daily. -Continue nortriptyline 75 mg nightly and lidocaine ointment as needed -Also consider Qutenza--prescription capsaicin.  Will discuss further with clinic pharmacist regarding this and other options. -Patient declines further EMG/NCS at this time. -     Pregabalin; Take 1 capsule (25 mg total) by mouth daily. To be taken with 150 mg cap for a total dose of 175 mg daily.  Dispense: 30 capsule; Refill: 3 -     Amb Referral to Clinical Pharmacist  Essential hypertension -controlled -continue lisinopril 10 mg daily and HCTZ 12.5 mg daily. -Continue lifestyle modifications  Diet controlled diabetes (HCC) -Hemoglobin A1c 6.8% on 10/28/2022 -Continue lifestyle modifications -Continue ACE I.  Not currently on statin.  Return in about 4 weeks (around 05/24/2023).   Deeann Saint, MD

## 2023-04-26 NOTE — Patient Instructions (Signed)
A new prescription for lyric 25 mg was sent to the pharmacy for you to take with the 150 mg tab.  We will also look into the other medication Qutenza to see if that is a possibility to help with symptoms.Marland Kitchen

## 2023-05-03 ENCOUNTER — Telehealth: Payer: Self-pay

## 2023-05-03 NOTE — Telephone Encounter (Signed)
Copied from CRM [id number]. Topic: Clinical - Medication Question >> May 03, 2023  1:03 PM Pascal Lux wrote: Reason for CRM: Patient called and stated that she received pregabalin (LYRICA) 25 MG and it states to take it once a day and she would like clarity on why it's not twice a day and if it's once a day when exactly should she take it. Call back request [phone].

## 2023-05-04 NOTE — Telephone Encounter (Signed)
Called and spoke with patient. Patient is to increase night time dose of Lyrica 25mg , patient is aware

## 2023-05-18 ENCOUNTER — Other Ambulatory Visit (INDEPENDENT_AMBULATORY_CARE_PROVIDER_SITE_OTHER): Payer: Medicare Other

## 2023-05-18 DIAGNOSIS — E785 Hyperlipidemia, unspecified: Secondary | ICD-10-CM

## 2023-05-18 DIAGNOSIS — G609 Hereditary and idiopathic neuropathy, unspecified: Secondary | ICD-10-CM

## 2023-05-18 NOTE — Progress Notes (Signed)
 05/18/2023 Name: Katherine [name] MRN: [id number] DOB: Aug 16, 1950  Chief Complaint  Patient presents with   Hyperlipidemia   Medication Management    Monita Swier is a 73 y.o. year old female who presented for a telephone visit.   They were referred to the pharmacist by their PCP for assistance in managing complex medication management and neuropathy .    Subjective:  Care Team: Primary Care Provider: Mercer Clotilda SAUNDERS, MD ; Next Scheduled Visit: 05/24/23  Medication Access/Adherence  Current Pharmacy:  [facility] [name] ([address]), [place] - [address] [place] ([place]) [place] [address] Phone: [phone] Fax: [phone]  [facility]) - [place], [place] - [address] [facility] [address] [place] [address] [place] [address] Phone: [phone] Fax: [phone]  [facility] - [place], [place] - [address] [place]  [address] Phone: [phone] Fax: [phone]   Patient reports affordability concerns with their medications: No  Patient reports access/transportation concerns to their pharmacy: No  Patient reports adherence concerns with their medications:  No      Hyperlipidemia/ASCVD Risk Reduction  Current lipid lowering medications: None Medications tried in the past: None  Antiplatelet regimen: None   Neuropathy Current medications: Lyrica  150 and 25mg  capsules BID, Nortriptyline  25mg  3 capsules at bedtime  Objective:  Lab Results  Component Value Date   HGBA1C 6.8 (H) 10/28/2022    Lab Results  Component Value Date   CREATININE 0.80 11/06/2022   BUN 10 11/06/2022   NA 137 11/06/2022   K 4.6 11/06/2022   CL 104 11/06/2022   CO2 25 10/28/2022    Lab Results  Component Value Date   CHOL 238 (H) 02/16/2022   HDL 45.70 02/16/2022   LDLCALC 160 (H) 02/16/2022   LDLDIRECT 131.0 11/12/2021   TRIG 160.0 (H) 02/16/2022    CHOLHDL 5 02/16/2022    Medications Reviewed Today     Reviewed by Lionell Jon DEL, RPH (Pharmacist) on 05/18/23 at 1104  Med List Status: <None>   Medication Order Taking? Sig Documenting Provider Last Dose Status Informant  Cholecalciferol (VITAMIN D ) 50 MCG (2000 UT) tablet [id number] Yes Take 2,000 Units by mouth in the morning. [provider] Taking Active Self  Cyanocobalamin (VITAMIN B-12 PO) [id number] No Take 1 tablet by mouth 3 (three) times a week. In the morning.  Patient not taking: Reported on 05/18/2023   [provider] Not Taking Active Self  cycloSPORINE  (RESTASIS ) 0.05 % ophthalmic emulsion [id number] Yes Place 1 drop into both eyes 2 (two) times daily. [provider] Taking Active Self  fexofenadine  (ALLEGRA  ALLERGY) 60 MG tablet [id number] Yes Take 1 tablet (60 mg total) by mouth daily. Mercer Clotilda SAUNDERS, MD Taking Active Self  fluticasone  (FLONASE ) 50 MCG/ACT nasal spray [id number] Yes Place 1 spray into both nostrils daily. Loreda Myla SAUNDERS, NP Taking Active Self  hydrochlorothiazide  (HYDRODIURIL ) 12.5 MG tablet [id number] Yes Take 12.5 mg by mouth in the morning and at bedtime. [provider] Taking Active Self  lidocaine  (XYLOCAINE ) 5 % ointment [id number] Yes APPLY 1 APPLICATION TOPICALLY 3  TIMES DAILY AS NEEDED Mercer Clotilda SAUNDERS, MD Taking Active   lidocaine -prilocaine  (EMLA ) cream [id number] Yes Apply 1 application topically to affected areas 3 times daily as needed. Mercer Clotilda SAUNDERS, MD Taking Active Self  lisinopril  (ZESTRIL ) 10 MG tablet [id number] Yes TAKE 1 TABLET BY MOUTH DAILY Mercer Clotilda SAUNDERS, MD Taking  Active   Multiple Vitamin (MULTIVITAMIN WITH MINERALS) TABS tablet [id number] Yes Take 1 tablet by mouth in the morning. [provider] Taking Active Self  nortriptyline  (PAMELOR ) 25 MG capsule [id number] Yes TAKE 3 CAPSULES BY MOUTH AT  BEDTIME Mercer Clotilda SAUNDERS, MD Taking Active   pregabalin  (LYRICA ) 150 MG capsule [id number]  Yes Take 1 capsule (150 mg total) by mouth 2 (two) times daily. Mercer Clotilda SAUNDERS, MD Taking Active   pregabalin  (LYRICA ) 25 MG capsule [id number] Yes Take 1 capsule (25 mg total) by mouth daily. To be taken with 150 mg cap for a total dose of 175 mg daily. Mercer Clotilda SAUNDERS, MD Taking Active   Med List Note Luster Inocente SAILOR, [place] 06/27/20 1548): Pt insurance will only cover for 152 g on the Lidocaine  Ointment              Assessment/Plan:   Hyperlipidemia/ASCVD Risk Reduction: - Currently uncontrolled.  - Reviewed long term complications of uncontrolled cholesterol - Reviewed dietary recommendations including low fat, high fiber diet - Reviewed lifestyle recommendations including exercise of at least 1104min/week - Recommend to discuss starting a statin at next follow up visit    Neuropathy: -Patient reports improvement from increased lyrica  dosing, denies any increased drowsiness -Would like to give this current dose another month before discussing any further increases or changes  Follow Up Plan: 1 month  Jon VEAR [place], PharmD Clinical Pharmacist [phone]

## 2023-06-21 ENCOUNTER — Other Ambulatory Visit: Payer: Medicare Other

## 2023-06-21 DIAGNOSIS — G6289 Other specified polyneuropathies: Secondary | ICD-10-CM

## 2023-06-21 NOTE — Progress Notes (Signed)
06/21/2023 Name: Katherine [name] MRN: [id number] DOB: 1950/08/27  Chief Complaint  Patient presents with   Hyperlipidemia   Medication Management    Katherine [name] is a 73 y.o. year old female who presented for a telephone visit.   They were referred to the pharmacist by their PCP for assistance in managing complex medication management and neuropathy .    Subjective:  Care Team: Primary Care Provider: Deeann Saint, MD ; Next Scheduled Visit: 07/14/23  Medication Access/Adherence  Current Pharmacy:  [facility] [address] ([address]), [place] - [address] [place] ([place]) [place] [address] Phone: [phone] Fax: [phone]  [facility]) - [place], [place] - [address] [facility] [address] [place] [address] [place] [address] Phone: [phone] Fax: [phone]  [facility] - [place], [place] - [name] [place] [address] Phone: [phone] Fax: [phone]   Patient reports affordability concerns with their medications: No  Patient reports access/transportation concerns to their pharmacy: No  Patient reports adherence concerns with their medications:  No      Hyperlipidemia/ASCVD Risk Reduction  Current lipid lowering medications: None Medications tried in the past: None  Antiplatelet regimen: None   Neuropathy Current medications: Lyrica 150 and 25mg  capsules BID, Nortriptyline 25mg  3 capsules at bedtime  Objective:  Lab Results  Component Value Date   HGBA1C 6.8 (H) 10/28/2022    Lab Results  Component Value Date   CREATININE 0.80 11/06/2022   BUN 10 11/06/2022   NA 137 11/06/2022   K 4.6 11/06/2022   CL 104 11/06/2022   CO2 25 10/28/2022    Lab Results  Component Value Date   CHOL 238 (H) 02/16/2022   HDL 45.70 02/16/2022   LDLCALC 160 (H) 02/16/2022   LDLDIRECT 131.0 11/12/2021   TRIG 160.0 (H) 02/16/2022    CHOLHDL 5 02/16/2022    Medications Reviewed Today     Reviewed by Sherrill Raring, RPH (Pharmacist) on 06/21/23 at 1413  Med List Status: <None>   Medication Order Taking? Sig Documenting Provider Last Dose Status Informant  Cholecalciferol (VITAMIN D) 50 MCG (2000 UT) tablet [id number] Yes Take 2,000 Units by mouth in the morning. [provider] Taking Active Self  Cyanocobalamin (VITAMIN B-12 PO) [id number] No Take 1 tablet by mouth 3 (three) times a week. In the morning.  Patient not taking: Reported on 06/21/2023   [provider] Not Taking Active Self  cycloSPORINE (RESTASIS) 0.05 % ophthalmic emulsion [id number] Yes Place 1 drop into both eyes 2 (two) times daily. [provider] Taking Active Self  fexofenadine (ALLEGRA ALLERGY) 60 MG tablet [id number] Yes Take 1 tablet (60 mg total) by mouth daily. Deeann Saint, MD Taking Active Self  fluticasone [facility]) 50 MCG/ACT nasal spray [id number] Yes Place 1 spray into both nostrils daily. Radford Pax, NP Taking Active Self  hydrochlorothiazide (HYDRODIURIL) 12.5 MG tablet [id number] Yes Take 12.5 mg by mouth daily. [provider] Taking Active Self  lidocaine (XYLOCAINE) 5 % ointment [id number]  APPLY 1 APPLICATION TOPICALLY 3  TIMES DAILY AS NEEDED Deeann Saint, MD  Active   lidocaine-prilocaine (EMLA) cream [id number] Yes Apply 1 application topically to affected areas 3 times daily as needed. Deeann Saint, MD Taking Active Self  lisinopril (ZESTRIL) 10 MG tablet [id number] Yes TAKE 1 TABLET BY MOUTH DAILY Deeann Saint, MD Taking Active   Multiple Vitamin (  MULTIVITAMIN WITH MINERALS) TABS tablet [id number] Yes Take 1 tablet by mouth in the morning. [provider] Taking Active Self  nortriptyline (PAMELOR) 25 MG capsule [id number] Yes TAKE 3 CAPSULES BY MOUTH AT  BEDTIME Deeann Saint, MD Taking Active   pregabalin (LYRICA) 150 MG capsule [id number] Yes Take 1 capsule (150 mg total)  by mouth 2 (two) times daily. Deeann Saint, MD Taking Active   pregabalin (LYRICA) 25 MG capsule [id number] Yes Take 1 capsule (25 mg total) by mouth daily. To be taken with 150 mg cap for a total dose of 175 mg daily. Deeann Saint, MD Taking Active            Med Note Sherrill Raring   Tue May 18, 2023 11:06 AM) Once daily at night  Med List Note Carola Rhine, [place] 06/27/20 1548): Pt insurance will only cover for 152 g on the Lidocaine Ointment              Assessment/Plan:   Hyperlipidemia/ASCVD Risk Reduction: - Currently uncontrolled.  - Reviewed long term complications of uncontrolled cholesterol - Reviewed dietary recommendations including low fat, high fiber diet - Reviewed lifestyle recommendations including exercise of at least 157min/week - Recommend a statin, patient declines at this appt, wants to get updated lipid panel prior to starting one to see how LDL may have improved with increased activity and diet changes   Neuropathy: -Patient reports improvement from increased lyrica dosing, denies any increased drowsiness -Very happy with her neuropathy control at this time, will let us know if that changes  Follow Up Plan: PCP 07/14/23, will reach out if indicated after that  Sherrill Raring, PharmD Clinical Pharmacist [phone]

## 2023-07-06 ENCOUNTER — Other Ambulatory Visit: Payer: Self-pay

## 2023-07-06 ENCOUNTER — Encounter (HOSPITAL_COMMUNITY)
Admission: RE | Admit: 2023-07-06 | Discharge: 2023-07-06 | Disposition: A | Discharge status: Home / Self Care | Payer: Medicare Other | Source: Ambulatory Visit | Attending: Urology | Admitting: Urology

## 2023-07-06 ENCOUNTER — Encounter (HOSPITAL_COMMUNITY): Payer: Self-pay

## 2023-07-06 ENCOUNTER — Other Ambulatory Visit: Payer: Self-pay | Admitting: Urology

## 2023-07-06 VITALS — BP 120/69 | HR 104 | Temp 98.9°F | Resp 16 | Ht 64.5 in | Wt 245.0 lb

## 2023-07-06 DIAGNOSIS — Z01818 Encounter for other preprocedural examination: Secondary | ICD-10-CM

## 2023-07-06 DIAGNOSIS — Z01812 Encounter for preprocedural laboratory examination: Secondary | ICD-10-CM | POA: Diagnosis not present

## 2023-07-06 DIAGNOSIS — Z79899 Other long term (current) drug therapy: Secondary | ICD-10-CM | POA: Diagnosis not present

## 2023-07-06 DIAGNOSIS — N132 Hydronephrosis with renal and ureteral calculous obstruction: Secondary | ICD-10-CM | POA: Diagnosis not present

## 2023-07-06 LAB — CBC
HCT: 40.9 % (ref 36.0–46.0)
Hemoglobin: 13.5 g/dL (ref 12.0–15.0)
MCH: 29.3 pg (ref 26.0–34.0)
MCHC: 33 g/dL (ref 30.0–36.0)
MCV: 88.9 fL (ref 80.0–100.0)
Platelets: 174 10*3/uL (ref 150–400)
RBC: 4.6 MIL/uL (ref 3.87–5.11)
RDW: 14 % (ref 11.5–15.5)
WBC: 4.1 10*3/uL (ref 4.0–10.5)
nRBC: 0 % (ref 0.0–0.2)

## 2023-07-06 LAB — BASIC METABOLIC PANEL
Anion gap: 10 (ref 5–15)
BUN: 17 mg/dL (ref 8–23)
CO2: 26 mmol/L (ref 22–32)
Calcium: 10.1 mg/dL (ref 8.9–10.3)
Chloride: 102 mmol/L (ref 98–111)
Creatinine, Ser: 0.97 mg/dL (ref 0.44–1.00)
GFR, Estimated: >60 mL/min (ref >60)
Glucose, Bld: 143 mg/dL — ABNORMAL HIGH (ref 70–99)
Potassium: 3.9 mmol/L (ref 3.5–5.1)
Sodium: 138 mmol/L (ref 135–145)

## 2023-07-06 LAB — GLUCOSE, CAPILLARY: Glucose-Capillary: 142 mg/dL — ABNORMAL HIGH (ref 70–99)

## 2023-07-06 NOTE — Patient Instructions (Signed)
 SURGICAL WAITING ROOM VISITATION  Patients having surgery or a procedure may have no more than 2 support people in the waiting area - these visitors may rotate.    Children under the age of 58 must have an adult with them who is not the patient.  Due to an increase in RSV and influenza rates and associated hospitalizations, children ages 60 and under may not visit patients in [facility] hospitals.  Visitors with respiratory illnesses are discouraged from visiting and should remain at home.  If the patient needs to stay at the hospital during part of their recovery, the visitor guidelines for inpatient rooms apply. Pre-op nurse will coordinate an appropriate time for 1 support person to accompany patient in pre-op.  This support person may not rotate.    Please refer to the [facility] website for the visitor guidelines for Inpatients (after your surgery is over and you are in a regular room).       Your procedure is scheduled on:  07/08/23    Report to [facility] Main Entrance    Report to admitting at  0815 AM   Call this number if you have problems the morning of surgery [phone]   Do not eat food : or drink liquids After Midnight.               If you have questions, please contact your surgeon's office.      Oral Hygiene is also important to reduce your risk of infection.                                    Remember - BRUSH YOUR TEETH THE MORNING OF SURGERY WITH YOUR REGULAR TOOTHPASTE  DENTURES WILL BE REMOVED PRIOR TO SURGERY PLEASE DO NOT APPLY "Poly grip" OR ADHESIVES!!!   Do NOT smoke after Midnight   Stop all vitamins and herbal supplements 7 days before surgery.   Take these medicines the morning of surgery with A SIP OF WATER:  eye drops as usual and lyrica   DO NOT TAKE ANY ORAL DIABETIC MEDICATIONS DAY OF YOUR SURGERY  Bring CPAP mask and tubing day of surgery.                              You may not have any metal on your body including  hair pins, jewelry, and body piercing             Do not wear make-up, lotions, powders, perfumes/cologne, or deodorant  Do not wear nail polish including gel and S&S, artificial/acrylic nails, or any other type of covering on natural nails including finger and toenails. If you have artificial nails, gel coating, etc. that needs to be removed by a nail salon please have this removed prior to surgery or surgery may need to be canceled/ delayed if the surgeon/ anesthesia feels like they are unable to be safely monitored.   Do not shave  48 hours prior to surgery.               Men may shave face and neck.   Do not bring valuables to the hospital. [facility] IS NOT             RESPONSIBLE   FOR VALUABLES.   Contacts, glasses, dentures or bridgework may not be worn into surgery.   Bring small overnight  bag day of surgery.   DO NOT BRING YOUR HOME MEDICATIONS TO THE HOSPITAL. PHARMACY WILL DISPENSE MEDICATIONS LISTED ON YOUR MEDICATION LIST TO YOU DURING YOUR ADMISSION IN THE HOSPITAL!    Patients discharged on the day of surgery will not be allowed to drive home.  Someone NEEDS to stay with you for the first 24 hours after anesthesia.   Special Instructions: Bring a copy of your healthcare power of attorney and living will documents the day of surgery if you haven't scanned them before.              Please read over the following fact sheets you were given: IF YOU HAVE QUESTIONS ABOUT YOUR PRE-OP INSTRUCTIONS PLEASE CALL [phone]   If you received a COVID test during your pre-op visit  it is requested that you wear a mask when out in public, stay away from anyone that may not be feeling well and notify your surgeon if you develop symptoms. If you test positive for Covid or have been in contact with anyone that has tested positive in the last 10 days please notify you surgeon.    [facility] - Preparing for Surgery Before surgery, you can play an important role.  Because skin is not  sterile, your skin needs to be as free of germs as possible.  You can reduce the number of germs on your skin by washing with CHG (chlorahexidine gluconate) soap before surgery.  CHG is an antiseptic cleaner which kills germs and bonds with the skin to continue killing germs even after washing. Please DO NOT use if you have an allergy to CHG or antibacterial soaps.  If your skin becomes reddened/irritated stop using the CHG and inform your nurse when you arrive at Short Stay. Do not shave (including legs and underarms) for at least 48 hours prior to the first CHG shower.  You may shave your face/neck. Please follow these instructions carefully:  1.  Shower with CHG Soap the night before surgery and the  morning of Surgery.  2.  If you choose to wash your hair, wash your hair first as usual with your  normal  shampoo.  3.  After you shampoo, rinse your hair and body thoroughly to remove the  shampoo.                           4.  Use CHG as you would any other liquid soap.  You can apply chg directly  to the skin and wash                       Gently with a scrungie or clean washcloth.  5.  Apply the CHG Soap to your body ONLY FROM THE NECK DOWN.   Do not use on face/ open                           Wound or open sores. Avoid contact with eyes, ears mouth and genitals (private parts).                       Wash face,  Genitals (private parts) with your normal soap.             6.  Wash thoroughly, paying special attention to the area where your surgery  will be performed.  7.  Thoroughly rinse your body with warm water  from the neck down.  8.  DO NOT shower/wash with your normal soap after using and rinsing off  the CHG Soap.                9.  Pat yourself dry with a clean towel.            10.  Wear clean pajamas.            11.  Place clean sheets on your bed the night of your first shower and do not  sleep with pets. Day of Surgery : Do not apply any lotions/deodorants the morning of surgery.   Please wear clean clothes to the hospital/surgery center.  FAILURE TO FOLLOW THESE INSTRUCTIONS MAY RESULT IN THE CANCELLATION OF YOUR SURGERY PATIENT SIGNATURE_________________________________  NURSE SIGNATURE__________________________________  ________________________________________________________________________

## 2023-07-06 NOTE — Progress Notes (Addendum)
 Anesthesia Review:  PCP: Abbe Amsterdam LOV 04/26/23  Cardiologist : none   PPM/ ICD: Device Orders: Rep Notified:  Chest x-ray : EKG : 12/28/22  Echo : Stress test: Cardiac Cath :   Activity level: can do a flight of stairs wtihout difficutoy  Sleep Study/ CPAP : none  Fasting Blood Sugar :      / Checks Blood Sugar -- times a day:   DM- type2- diet controlled on no meds  Hgba1c- 07/06/23- 6.6 Does not check glucose at home   Blood Thinner/ Instructions /Last Dose: ASA / Instructions/ Last Dose :   PT was set up as a telephone appt.  PT came in for preop appt.  PT states It was confusing .  Pharmacy Tech told her it was inperson appt  Preop med hx and preop instructions complated along with lab work.

## 2023-07-07 LAB — HEMOGLOBIN A1C
Hgb A1c MFr Bld: 6.6 % — ABNORMAL HIGH (ref 4.8–5.6)
Mean Plasma Glucose: 143 mg/dL

## 2023-07-07 NOTE — H&P (Signed)
 Office Visit Report     07/05/2023   --------------------------------------------------------------------------------   Katherine [name]  MRN: [id number]  DOB: Sep 02, 1950, 73 year old Female  SSN:    PRIMARY CARE:  Eleonore Chiquito (retired), MD  PRIMARY CARE FAX:  [phone]  REFERRING:  Azucena Kuba  PROVIDER:  Heloise Purpura, M.D.  TREATING:  Bartholomew Crews, NP  LOCATION:  [facility] [phone]     --------------------------------------------------------------------------------   CC/HPI: 1. Left renal calculus  2. Hypercalciuria   Katherine [name] follows up today for ongoing management of her known left renal calculus and history of recurrent stone disease with hypercalciuria. She continues with her current medical therapy taking hydrochlorothiazide 12.5 mg twice daily. She denies hematuria, flank pain, or passage of any stones over the past year. She follows up today with a KUB x-ray. Unfortunately, she continues to deal with her chronic back issues. She did have surgery in June on her lumbar back. She is now able to stand and walk with a walker which is an improvement although she still is in significant pain and undergoing physical therapy.   07/05/2023: Katherine [name] presents today with concerns of blood in her urine and left flank pain. This began a few days ago and has been coming and going. She has not had fevers or chills. She denies dysuria. She has a 10 mm left renal stone that we have been monitoring.     ALLERGIES: Erythromycin Derivatives - Other Reaction, Chest pain    MEDICATIONS: Hydrochlorothiazide 12.5 mg tablet 1 tablet PO BID  Lidocaine 5 % cream  Lisinopril 10 mg tablet Oral  Lyrica 150 mg capsule  Metanx 2 mg-3 mg-35 mg tablet Oral  Multivitamins tablet Oral  Nortriptyline Hcl 50 mg capsule Oral  Restasis  Tramadol Hcl 50 mg tablet Oral  Vitamin D-3 5000 UNIT TABS Oral     GU PSH: Cysto Uretero Lithotripsy - 2016 Cystoscopy  Insert Stent - 2016 D&C Non-OB - 2016 ESWL - 2018 Hysterectomy Unilat SO - 2016     NON-GU PSH: Carpal tunnel surgery - 2016 Hand/finger Surgery Remove Tonsils - 2016 Revise Knee Joint - 2016 Rotator cuff surgery - 2016 Wrist Arthroscopy/surgery     GU PMH: Renal calculus - 03/03/2023, - 02/27/2022, - 02/21/2021, - 2021, - 2018 Ureteral calculus, Left - 2022, (Stable, Chronic), Left, No change in left proximal ureteral stone. Recommend she proceed with elective ESWL. Culture urine. No ABX unless culture proven UTI. , - 2018, Ureteral stone, - 2016 Gross hematuria (Acute), Culture urine. No ABX unless culture proven UTI. Secondary to proximal left ureteral calculus. - 2018, Gross hematuria, - 2016 Urinary Calculus, Unspec, Urolithiasis - 2017 History of urolithiasis, History of nephrolithiasis - 2016      PMH Notes:   1) Urolithiasis: She has a history of recurrent urolithiasis. She initially presented to me in July 2016 with gross hematuria and was found to have recurrent stone disease as the source of her symptoms. She underwent a metabolic evaluation and was found to have hypercalciuria. She ultimately was diagnosed with hyperparathyroidism and was treated with a parathyroidectomy in 2016 by Dr. Gerrit Friends. Despite her parathyroidectomy, she presented with recurrent stone disease in October 2018.   Current medical management: HCTZ 12.5 mg bid   Aug 2016: L ureteroscopic laser lithotripsy (calcium oxalate)  Oct 2018: L ESWL of ureteral stone   NON-GU PMH: Generalized edema - 2022 Bacteriuria - 2019 Hyperlipidemia, unspecified, Dyslipidemia - 2016 Personal history of other  diseases of the nervous system and sense organs, History of peripheral neuropathy - 2016, History of neuropathy, - 2016 Diverticulitis Hypertension Primary hyperparathyroidism    FAMILY HISTORY: Asthma - Runs In Family Bone Cancer - Runs In Family Brain tumor - Runs In Family Chronic Obstructive Pulmonary  Disease - Runs In Family Death - Mother, Father malignant neoplasm of uterus - Runs In Family   SOCIAL HISTORY: Marital Status: Single Preferred Language: English; Ethnicity: Not Hispanic Or Latino; Race: Black or African American Current Smoking Status: Patient has never smoked.   Tobacco Use Assessment Completed: Used Tobacco in last 30 days? Does not use smokeless tobacco. Has never drank.  Does not use drugs. Does not drink caffeine. Patient's occupation is/was Retired.    REVIEW OF SYSTEMS:    GU Review Female:   Patient reports trouble starting your stream, get up at night to urinate, hard to postpone urination, and frequent urination. Patient denies burning /pain with urination, being pregnant, have to strain to urinate, leakage of urine, and stream starts and stops.  Gastrointestinal (Upper):   Patient denies nausea, vomiting, and indigestion/ heartburn.  Gastrointestinal (Lower):   Patient denies diarrhea and constipation.  Constitutional:   Patient denies fever, night sweats, weight loss, and fatigue.  Skin:   Patient denies skin rash/ lesion and itching.  Eyes:   Patient denies blurred vision and double vision.  Ears/ Nose/ Throat:   Patient denies sore throat and sinus problems.  Hematologic/Lymphatic:   Patient denies swollen glands and easy bruising.  Cardiovascular:   Patient denies leg swelling and chest pains.  Respiratory:   Patient denies cough and shortness of breath.  Endocrine:   Patient denies excessive thirst.  Musculoskeletal:   Patient reports back pain. Patient denies joint pain.  Neurological:   Patient reports headaches. Patient denies dizziness.  Psychologic:   Patient denies depression and anxiety.   Notes: blood in the urine, possible kidney stone, left flank pain     VITAL SIGNS:      07/05/2023 01:47 PM  BP 105/71 mmHg  Pulse 121 /min  Temperature 97.7 F / 36.5 C   GU PHYSICAL EXAMINATION:      Notes: No CVA tenderness.   MULTI-SYSTEM  PHYSICAL EXAMINATION:    Constitutional: Well-nourished. No physical deformities. Normally developed. Good grooming.  Cardiovascular: Normal temperature, normal extremity pulses, no swelling, no varicosities.  Skin: No paleness, no jaundice, no cyanosis. No lesion, no ulcer, no rash.  Neurologic / Psychiatric: Oriented to time, oriented to place, oriented to person. No depression, no anxiety, no agitation.  Gastrointestinal: No mass, no tenderness, no rigidity, non obese abdomen.   Notes: She ambulates with a walker.     Complexity of Data:  Source Of History:  Patient  Records Review:   Previous Doctor Records, Previous Patient Records  Urine Test Review:   Urinalysis   07/05/23  Urinalysis  Urine Appearance Slightly Cloudy   Urine Color Yellow   Urine Glucose Neg mg/dL  Urine Bilirubin Neg mg/dL  Urine Ketones Neg mg/dL  Urine Specific Gravity 1.020   Urine Blood 3+ ery/uL  Urine pH 6.0   Urine Protein 2+ mg/dL  Urine Urobilinogen 0.2 mg/dL  Urine Nitrites Neg   Urine Leukocyte Esterase 2+ leu/uL  Urine WBC/hpf 0 - 5/hpf   Urine RBC/hpf 10 - 20/hpf   Urine Epithelial Cells 0 - 5/hpf   Urine Bacteria Mod (26-50/hpf)   Urine Mucous Not Present   Urine Yeast NS (Not Seen)  Urine Trichomonas Not Present   Urine Cystals NS (Not Seen)   Urine Casts NS (Not Seen)   Urine Sperm Not Present    PROCEDURES:         Renal Ultrasound (Limited) - 78295  Kidney: Left Length: 11.5 cm Depth: 8.1 cm Cortical Width: 1.4 cm Width: 5.8 cm    Left Kidney/Ureter:  Moderate hydro. Appears to have multiple calcifications mid ureter.  Bladder:  PVR 83.56 ml      Patient confirmed No Neulasta OnPro Device.            KUB - [id number]  A single view of the abdomen is obtained. 7 to 8 mm opacity noted in the proximal left ureter at the level of L4-L5.      Patient confirmed No Neulasta OnPro Device.           Visit Complexity - G2211          Urinalysis w/Scope Dipstick Dipstick  Cont'd Micro  Color: Yellow Bilirubin: Neg mg/dL WBC/hpf: 0 - 5/hpf  Appearance: Slightly Cloudy Ketones: Neg mg/dL RBC/hpf: 10 - 62/ZHY  Specific Gravity: 1.020 Blood: 3+ ery/uL Bacteria: Mod (26-50/hpf)  pH: 6.0 Protein: 2+ mg/dL Cystals: NS (Not Seen)  Glucose: Neg mg/dL Urobilinogen: 0.2 mg/dL Casts: NS (Not Seen)    Nitrites: Neg Trichomonas: Not Present    Leukocyte Esterase: 2+ leu/uL Mucous: Not Present      Epithelial Cells: 0 - 5/hpf      Yeast: NS (Not Seen)      Sperm: Not Present    Notes: **QNS for spun micro**    ASSESSMENT:      ICD-10 Details  1 GU:   Gross hematuria - R31.0 Acute, Uncomplicated  2   Renal calculus - N20.0   3   Ureteral obstruction secondary to calculous - N13.2 Left, Acute, Systemic Symptoms   PLAN:            Medications New Meds: Ondansetron Odt 4 mg tablet,disintegrating 1 tablet PO Q 6 H PRN nausea  #20  0 Refill(s)  Oxycodone-Acetaminophen 5 mg-325 mg tablet 1 tablet PO Q 6 H PRN for severe pain  #20  0 Refill(s)  Pharmacy Name:  [id number]  Address:  [address]   [place]), [place] [address]  Phone:  [phone]  Fax:  [phone]            Orders Labs CULTURE, URINE  X-Rays: KUB    Renal Ultrasound (Limited) - left please          Schedule         Document Letter(s):  Created for Patient: Clinical Summary         Notes:   KUB and renal ultrasound show obstruction to left kidney with a possible cluster of stones in the left ureter. These measure approximately 7 to 8 mm. I discussed intervention with her and she would like to proceed with ureteroscopy which I think would also be beneficial since she appears to have more than 1 calculus in her left ureter. Urinalysis will be sent for precautionary culture today. Stone intervention was discussed in detail today. For ureteroscopy, the patient understands that there is a chance for a staged procedure. Patient also understands that there is risk for  bleeding, infection, injury to surrounding organs, and general risks of anesthesia. The patient also understands the placement of a stent and the risks of stent placement including, risk for infection, the risk for  pain, and the risk for injury. For ESWL, the patient understands that there is a chance of failure of procedure, there is also a risk for bruising, infection, bleeding, and injury to surrounding structures. The patient verbalized understanding to these risks.   Pain and nausea medicine were sent to her pharmacy and a urine was sent for culture.        Next Appointment:      Next Appointment: 03/08/2024 08:15 AM    Appointment Type: KUB    Location: [facility] [phone]    Provider: KUB KUB    Reason for Visit: 1 Year - KUB, OV      * Signed by Bartholomew Crews, NP on 07/05/23 at 4:59 PM (EST)*

## 2023-07-08 ENCOUNTER — Ambulatory Visit (HOSPITAL_COMMUNITY)
Admission: RE | Admit: 2023-07-08 | Discharge: 2023-07-08 | Disposition: A | Discharge status: Home / Self Care | Payer: Medicare Other | Source: Ambulatory Visit | Attending: Urology | Admitting: Urology

## 2023-07-08 ENCOUNTER — Ambulatory Visit (HOSPITAL_COMMUNITY): Payer: Medicare Other | Admitting: Anesthesiology

## 2023-07-08 ENCOUNTER — Encounter (HOSPITAL_COMMUNITY)
Admission: RE | Disposition: A | Discharge status: Home / Self Care | Payer: Self-pay | Source: Ambulatory Visit | Attending: Urology

## 2023-07-08 ENCOUNTER — Ambulatory Visit (HOSPITAL_COMMUNITY): Payer: Medicare Other

## 2023-07-08 ENCOUNTER — Encounter (HOSPITAL_COMMUNITY): Payer: Self-pay | Admitting: Urology

## 2023-07-08 DIAGNOSIS — E119 Type 2 diabetes mellitus without complications: Secondary | ICD-10-CM

## 2023-07-08 DIAGNOSIS — I10 Essential (primary) hypertension: Secondary | ICD-10-CM | POA: Insufficient documentation

## 2023-07-08 DIAGNOSIS — E66813 Obesity, class 3: Secondary | ICD-10-CM | POA: Diagnosis not present

## 2023-07-08 DIAGNOSIS — N202 Calculus of kidney with calculus of ureter: Secondary | ICD-10-CM | POA: Insufficient documentation

## 2023-07-08 DIAGNOSIS — Z79899 Other long term (current) drug therapy: Secondary | ICD-10-CM | POA: Diagnosis not present

## 2023-07-08 DIAGNOSIS — N135 Crossing vessel and stricture of ureter without hydronephrosis: Secondary | ICD-10-CM | POA: Insufficient documentation

## 2023-07-08 DIAGNOSIS — E785 Hyperlipidemia, unspecified: Secondary | ICD-10-CM | POA: Diagnosis not present

## 2023-07-08 DIAGNOSIS — Z7984 Long term (current) use of oral hypoglycemic drugs: Secondary | ICD-10-CM | POA: Insufficient documentation

## 2023-07-08 DIAGNOSIS — R310 Gross hematuria: Secondary | ICD-10-CM | POA: Diagnosis not present

## 2023-07-08 DIAGNOSIS — Z6841 Body Mass Index (BMI) 40.0 and over, adult: Secondary | ICD-10-CM | POA: Insufficient documentation

## 2023-07-08 DIAGNOSIS — Z881 Allergy status to other antibiotic agents status: Secondary | ICD-10-CM | POA: Insufficient documentation

## 2023-07-08 DIAGNOSIS — Z01818 Encounter for other preprocedural examination: Secondary | ICD-10-CM

## 2023-07-08 HISTORY — PX: CYSTOSCOPY/URETEROSCOPY/HOLMIUM LASER/STENT PLACEMENT: SHX6546

## 2023-07-08 LAB — GLUCOSE, CAPILLARY
Glucose-Capillary: 133 mg/dL — ABNORMAL HIGH (ref 70–99)
Glucose-Capillary: 119 mg/dL — ABNORMAL HIGH (ref 70–99)

## 2023-07-08 SURGERY — CYSTOSCOPY/URETEROSCOPY/HOLMIUM LASER/STENT PLACEMENT
Anesthesia: General | Laterality: Left

## 2023-07-08 MED ORDER — LIDOCAINE HCL (PF) 2 % IJ SOLN
INTRAMUSCULAR | Status: AC
  Filled 2023-07-08: qty 5

## 2023-07-08 MED ORDER — OXYCODONE HCL 5 MG/5ML PO SOLN: 5.0000 mg | Freq: Once | ORAL | Status: DC | PRN

## 2023-07-08 MED ORDER — LACTATED RINGERS IV SOLN: INTRAVENOUS | Status: DC

## 2023-07-08 MED ORDER — FENTANYL CITRATE (PF) 100 MCG/2ML IJ SOLN
INTRAMUSCULAR | Status: AC
  Filled 2023-07-08: qty 2

## 2023-07-08 MED ORDER — DEXAMETHASONE SODIUM PHOSPHATE 10 MG/ML IJ SOLN
INTRAMUSCULAR | Status: DC | PRN
  Administered 2023-07-08: 10 mg via INTRAVENOUS

## 2023-07-08 MED ORDER — LIDOCAINE HCL (PF) 2 % IJ SOLN
INTRAMUSCULAR | Status: DC | PRN
  Administered 2023-07-08: 100 mg via INTRADERMAL

## 2023-07-08 MED ORDER — PROPOFOL 10 MG/ML IV BOLUS
INTRAVENOUS | Status: AC
  Filled 2023-07-08: qty 20

## 2023-07-08 MED ORDER — CHLORHEXIDINE GLUCONATE 0.12 % MT SOLN
15.0000 mL | Freq: Once | OROMUCOSAL | Status: AC
  Administered 2023-07-08: 15 mL via OROMUCOSAL

## 2023-07-08 MED ORDER — SODIUM CHLORIDE 0.9 % IR SOLN
Status: DC | PRN
  Administered 2023-07-08: 3000 mL via INTRAVESICAL

## 2023-07-08 MED ORDER — ONDANSETRON HCL 4 MG/2ML IJ SOLN: 4.0000 mg | Freq: Once | INTRAMUSCULAR | Status: DC | PRN

## 2023-07-08 MED ORDER — ACETAMINOPHEN 10 MG/ML IV SOLN
INTRAVENOUS | Status: AC
  Filled 2023-07-08: qty 100

## 2023-07-08 MED ORDER — IOHEXOL 300 MG/ML  SOLN
INTRAMUSCULAR | Status: DC | PRN
  Administered 2023-07-08: 10 mL

## 2023-07-08 MED ORDER — HYDROMORPHONE HCL 1 MG/ML IJ SOLN: 0.2500 mg | INTRAMUSCULAR | Status: DC | PRN

## 2023-07-08 MED ORDER — ACETAMINOPHEN 10 MG/ML IV SOLN
INTRAVENOUS | Status: DC | PRN
  Administered 2023-07-08: 1000 mg via INTRAVENOUS

## 2023-07-08 MED ORDER — ONDANSETRON HCL 4 MG/2ML IJ SOLN
INTRAMUSCULAR | Status: DC | PRN
  Administered 2023-07-08: 4 mg via INTRAVENOUS

## 2023-07-08 MED ORDER — OXYCODONE HCL 5 MG PO TABS: 5.0000 mg | ORAL_TABLET | Freq: Once | ORAL | Status: DC | PRN

## 2023-07-08 MED ORDER — CEFAZOLIN SODIUM-DEXTROSE 2-4 GM/100ML-% IV SOLN
2.0000 g | INTRAVENOUS | Status: AC
  Administered 2023-07-08: 2 g via INTRAVENOUS
  Filled 2023-07-08: qty 100

## 2023-07-08 MED ORDER — ONDANSETRON HCL 4 MG/2ML IJ SOLN
INTRAMUSCULAR | Status: AC
Start: 2023-07-08 — End: ?
  Filled 2023-07-08: qty 2

## 2023-07-08 MED ORDER — DEXAMETHASONE SODIUM PHOSPHATE 10 MG/ML IJ SOLN
INTRAMUSCULAR | Status: AC
  Filled 2023-07-08: qty 1

## 2023-07-08 MED ORDER — FENTANYL CITRATE (PF) 100 MCG/2ML IJ SOLN
INTRAMUSCULAR | Status: DC | PRN
Start: 2023-07-08 — End: 2023-07-08
  Administered 2023-07-08 (×2): 50 ug via INTRAVENOUS
  Administered 2023-07-08: 25 ug via INTRAVENOUS

## 2023-07-08 MED ORDER — ORAL CARE MOUTH RINSE: 15.0000 mL | Freq: Once | OROMUCOSAL | Status: AC

## 2023-07-08 MED ORDER — INSULIN ASPART 100 UNIT/ML IJ SOLN: 0.0000 [IU] | INTRAMUSCULAR | Status: DC | PRN

## 2023-07-08 MED ORDER — MIDAZOLAM HCL 2 MG/2ML IJ SOLN
INTRAMUSCULAR | Status: AC
  Filled 2023-07-08: qty 2

## 2023-07-08 MED ORDER — PROPOFOL 10 MG/ML IV BOLUS
INTRAVENOUS | Status: DC | PRN
Start: 2023-07-08 — End: 2023-07-08
  Administered 2023-07-08: 200 mg via INTRAVENOUS

## 2023-07-08 SURGICAL SUPPLY — 19 items
BAG COUNTER SPONGE SURGICOUNT (BAG) IMPLANT
BAG URO CATCHER STRL LF (MISCELLANEOUS) ×2 IMPLANT
BASKET ZERO TIP NITINOL 2.4FR (BASKET) IMPLANT
CATH URETL OPEN END 6FR 70 (CATHETERS) IMPLANT
CLOTH BEACON ORANGE TIMEOUT ST (SAFETY) ×2 IMPLANT
GLOVE SURG LX STRL 7.5 STRW (GLOVE) ×2 IMPLANT
GOWN STRL REUS W/ TWL XL LVL3 (GOWN DISPOSABLE) ×2 IMPLANT
GUIDEWIRE STR DUAL SENSOR (WIRE) ×2 IMPLANT
GUIDEWIRE ZIPWRE .038 STRAIGHT (WIRE) IMPLANT
IV NS 1000ML BAXH (IV SOLUTION) ×2 IMPLANT
KIT TURNOVER KIT A (KITS) IMPLANT
LASER FIB FLEXIVA PULSE ID 365 (Laser) IMPLANT
MANIFOLD NEPTUNE II (INSTRUMENTS) ×2 IMPLANT
PACK CYSTO (CUSTOM PROCEDURE TRAY) ×2 IMPLANT
SHEATH NAVIGATOR HD 12/14X36 (SHEATH) IMPLANT
STENT URET 6FRX24 CONTOUR (STENTS) IMPLANT
TRACTIP FLEXIVA PULS ID 200XHI (Laser) IMPLANT
TUBING CONNECTING 10 (TUBING) ×2 IMPLANT
TUBING UROLOGY SET (TUBING) ×2 IMPLANT

## 2023-07-08 NOTE — Anesthesia Postprocedure Evaluation (Signed)
 Anesthesia Post Note  Patient: Katherine [name]  Procedure(s) Performed: CYSTOSCOPY/ LEFT URETEROSCOPY/HOLMIUM LASER/STENT PLACEMENT, LEFT RETROGRADE PYELOGRAM (Left)     Patient location during evaluation: PACU Anesthesia Type: General Level of consciousness: awake and alert and oriented Pain management: pain level controlled Vital Signs Assessment: post-procedure vital signs reviewed and stable Respiratory status: spontaneous breathing, nonlabored ventilation and respiratory function stable Cardiovascular status: blood pressure returned to baseline and stable Postop Assessment: no apparent nausea or vomiting Anesthetic complications: no   No notable events documented.  Last Vitals:  Vitals:   07/08/23 1200 07/08/23 1215  BP: (!) 154/105 (!) 149/93  Pulse: 98 95  Resp: 19 11  Temp:    SpO2: 100% 100%    Last Pain:  Vitals:   07/08/23 1215  TempSrc:   PainSc: 0-No pain                 Amador Braddy A.

## 2023-07-08 NOTE — Discharge Instructions (Addendum)
You may see some blood in the urine and may have some burning with urination for 48-72 hours. You also may notice that you have to urinate more frequently or urgently after your procedure which is normal.  You should call should you develop an inability urinate, fever > 101, persistent nausea and vomiting that prevents you from eating or drinking to stay hydrated.  If you have a stent, you will likely urinate more frequently and urgently until the stent is removed and you may experience some discomfort/pain in the lower abdomen and flank especially when urinating. You may take pain medication prescribed to you if needed for pain. You may also intermittently have blood in the urine until the stent is removed. You may remove your stent on Monday morning.  Simply pull the string that is taped to your body and the stent will easily come out.  This may be best done in the shower as some urine may come out with the stent.  Usually you will feel relief once the stent is removed, but occasionally patients can develop pain due to residual swelling of the ureter that may temporarily obstruct the kidney.  This can be managed by taking pain medication and it will typically resolve with time.  Please do not hesitate to call if you have pain that is not controlled with your pain medication or does not improved within 24-48 hours.  

## 2023-07-08 NOTE — Op Note (Signed)
 Preoperative diagnosis: Left ureteral calculus  Postoperative diagnosis: Left ureteral calculi  Procedure:  Cystoscopy Left ureteroscopy and stone removal Ureteroscopic laser lithotripsy Left ureteral stent placement (6 x 24 with string)  Left retrograde pyelography with interpretation  Surgeon: Moody Bruins. M.D.  Anesthesia: General  Complications: None  Intraoperative findings: Left retrograde pyelography demonstrated two filling defects within the proximal left ureter consistent with the patient's known calculus without other abnormalities.  EBL: Minimal  Specimens: Left ureteral calculi  Disposition of specimens: [facility] for stone analysis  Indication: Katherine [name] is a 73 y.o. year old patient with urolithiasis.  She recently presented with acute flank pain and was found to have an 8 mm proximal left ureteral calculus.  After reviewing the management options for treatment, the patient elected to proceed with the above surgical procedure(s). We have discussed the potential benefits and risks of the procedure, side effects of the proposed treatment, the likelihood of the patient achieving the goals of the procedure, and any potential problems that might occur during the procedure or recuperation. Informed consent has been obtained.  Description of procedure:  The patient was taken to the operating room and general anesthesia was induced.  The patient was placed in the dorsal lithotomy position, prepped and draped in the usual sterile fashion, and preoperative antibiotics were administered. A preoperative time-out was performed.   Cystourethroscopy was performed.  The patient's urethra was examined and was normal. The bladder was then systematically examined in its entirety. There was no evidence for any bladder tumors, stones, or other mucosal pathology.    Attention then turned to the left on the peritoneal ureteral orifice and a ureteral  catheter was used to intubate the ureteral orifice.  Omnipaque contrast was injected through the ureteral catheter and a retrograde pyelogram was performed with findings as dictated above.  A 0.38 sensor guidewire was then advanced up the left ureter into the renal pelvis under fluoroscopic guidance. The 6 Fr semirigid ureteroscope was then advanced into the ureter next to the guidewire and the calculus was identified.   The stone was then fragmented with the 365 micron holmium laser fiber on a setting of 0.8 J and frequency of 10 hz.  After the first and was fragmented, it was noted that there was a second adjacent stone in the ureter as well.  This was also fragmented in a similar fashion.  All stones were then removed from the ureter with a zero tip nitinol basket.  Reinspection of the ureter revealed no remaining visible stones or fragments.  I then advanced a second 0.038 Glidewire up into the renal collecting system.  I then advanced a flexible digital ureteroscope over the Glidewire into the renal collecting system.  The renal collecting system was examined.  The stone that was noted on her CT scan that measured approximately 2 to 3 mm appeared to be consistent with a papillary type calcification with no actual stone identified.  The wire was then backloaded through the cystoscope and a ureteral stent was advance over the wire using Seldinger technique.  The stent was positioned appropriately under fluoroscopic and cystoscopic guidance.  The wire was then removed with an adequate stent curl noted in the renal pelvis as well as in the bladder.  The bladder was then emptied and the procedure ended.  The patient appeared to tolerate the procedure well and without complications.  The patient was able to be awakened and transferred to the recovery unit in satisfactory condition.

## 2023-07-08 NOTE — Anesthesia Preprocedure Evaluation (Addendum)
 Anesthesia Evaluation  Patient identified by MRN, date of birth, ID band Patient awake    Reviewed: Allergy & Precautions, NPO status , Patient's Chart, lab work & pertinent test results, reviewed documented beta blocker date and time   History of Anesthesia Complications (+) history of anesthetic complications  Airway Mallampati: II  TM Distance: >3 FB     Dental  (+) Partial Lower, Partial Upper, Dental Advisory Given   Pulmonary    breath sounds clear to auscultation + decreased breath sounds      Cardiovascular hypertension, Pt. on medications Normal cardiovascular exam Rhythm:Regular Rate:Normal  EKG 10/28/22 ST, otherwise normal   Neuro/Psych Peripheral neuropathy  Neuromuscular disease  negative psych ROS   GI/Hepatic negative GI ROS, Neg liver ROS,,,  Endo/Other  diabetes, Well Controlled, Type 2, Oral Hypoglycemic Agents  Class 3 obesityHLD Hx/o hyperparathyroidism  Renal/GU Right nephrolithiasis  negative genitourinary   Musculoskeletal  (+) Arthritis , Osteoarthritis,  Lumbar spinal stenosis   Abdominal  (+) + obese  Peds  Hematology negative hematology ROS (+)   Anesthesia Other Findings   Reproductive/Obstetrics                             Anesthesia Physical Anesthesia Plan  ASA: 3  Anesthesia Plan: General   Post-op Pain Management: Dilaudid IV and Tylenol PO (pre-op)*   Induction: Intravenous  PONV Risk Score and Plan: 4 or greater and Treatment may vary due to age or medical condition and Ondansetron  Airway Management Planned: LMA  Additional Equipment: None  Intra-op Plan:   Post-operative Plan: Extubation in OR  Informed Consent: I have reviewed the patients History and Physical, chart, labs and discussed the procedure including the risks, benefits and alternatives for the proposed anesthesia with the patient or authorized representative who has indicated  his/her understanding and acceptance.     Dental advisory given  Plan Discussed with: CRNA and Anesthesiologist  Anesthesia Plan Comments:         Anesthesia Quick Evaluation

## 2023-07-08 NOTE — Transfer of Care (Signed)
 Immediate Anesthesia Transfer of Care Note  Patient: Katherine [name]  Procedure(s) Performed: CYSTOSCOPY/ LEFT URETEROSCOPY/HOLMIUM LASER/STENT PLACEMENT, LEFT RETROGRADE PYELOGRAM (Left)  Patient Location: PACU  Anesthesia Type:General  Level of Consciousness: sedated  Airway & Oxygen Therapy: Patient Spontanous Breathing  Post-op Assessment: Report given to RN  Post vital signs: Reviewed and stable  Last Vitals:  Vitals Value Taken Time  BP 153/106 07/08/23 1145  Temp    Pulse 105 07/08/23 1146  Resp 18 07/08/23 1146  SpO2 100 % 07/08/23 1146  Vitals shown include unfiled device data.  Last Pain:  Vitals:   07/08/23 0912  TempSrc:   PainSc: 0-No pain         Complications: No notable events documented.

## 2023-07-08 NOTE — Interval H&P Note (Signed)
 History and Physical Interval Note:  07/08/2023 10:24 AM  Katherine [name]  has presented today for surgery, with the diagnosis of LEFT RENAL STONE.  The various methods of treatment have been discussed with the patient and family. After consideration of risks, benefits and other options for treatment, the patient has consented to  Procedure(s) with comments: CYSTOSCOPY/ LEFT URETEROSCOPY/HOLMIUM LASER/STENT PLACEMENT, LEFT RETROGRADE PYELOGRAM (Left) - 60 MINUTE CASE as a surgical intervention.  The patient's history has been reviewed, patient examined, no change in status, stable for surgery.  I have reviewed the patient's chart and labs.  Questions were answered to the patient's satisfaction.     [employer]

## 2023-07-09 ENCOUNTER — Encounter (HOSPITAL_COMMUNITY): Payer: Self-pay | Admitting: Urology

## 2023-07-14 ENCOUNTER — Ambulatory Visit: Payer: Medicare Other | Admitting: Family Medicine

## 2023-07-23 ENCOUNTER — Encounter: Payer: Self-pay | Admitting: Family Medicine

## 2023-07-23 ENCOUNTER — Ambulatory Visit (INDEPENDENT_AMBULATORY_CARE_PROVIDER_SITE_OTHER): Admitting: Family Medicine

## 2023-07-23 VITALS — BP 112/70 | HR 113 | Temp 98.4°F | Ht 64.5 in | Wt 243.2 lb

## 2023-07-23 DIAGNOSIS — Z960 Presence of urogenital implants: Secondary | ICD-10-CM | POA: Diagnosis not present

## 2023-07-23 DIAGNOSIS — R5383 Other fatigue: Secondary | ICD-10-CM | POA: Diagnosis not present

## 2023-07-23 DIAGNOSIS — I10 Essential (primary) hypertension: Secondary | ICD-10-CM | POA: Diagnosis not present

## 2023-07-23 DIAGNOSIS — E119 Type 2 diabetes mellitus without complications: Secondary | ICD-10-CM

## 2023-07-23 LAB — CBC WITH DIFFERENTIAL/PLATELET
Basophils Absolute: 0 10*3/uL (ref 0.0–0.1)
Basophils Relative: 0.7 % (ref 0.0–3.0)
Eosinophils Absolute: 0.1 10*3/uL (ref 0.0–0.7)
Eosinophils Relative: 1.7 % (ref 0.0–5.0)
HCT: 39.5 % (ref 36.0–46.0)
Hemoglobin: 13.2 g/dL (ref 12.0–15.0)
Lymphocytes Relative: 44.8 % (ref 12.0–46.0)
Lymphs Abs: 2 10*3/uL (ref 0.7–4.0)
MCHC: 33.5 g/dL (ref 30.0–36.0)
MCV: 86.7 fl (ref 78.0–100.0)
Monocytes Absolute: 0.4 10*3/uL (ref 0.1–1.0)
Monocytes Relative: 9.1 % (ref 3.0–12.0)
Neutro Abs: 1.9 10*3/uL (ref 1.4–7.7)
Neutrophils Relative %: 43.7 % (ref 43.0–77.0)
Platelets: 260 10*3/uL (ref 150.0–400.0)
RBC: 4.55 Mil/uL (ref 3.87–5.11)
RDW: 14.2 % (ref 11.5–15.5)
WBC: 4.4 10*3/uL (ref 4.0–10.5)

## 2023-07-23 LAB — BASIC METABOLIC PANEL
BUN: 13 mg/dL (ref 6–23)
CO2: 27 meq/L (ref 19–32)
Calcium: 10.2 mg/dL (ref 8.4–10.5)
Chloride: 103 meq/L (ref 96–112)
Creatinine, Ser: 0.96 mg/dL (ref 0.40–1.20)
GFR: 58.38 mL/min — ABNORMAL LOW (ref >60.00)
Glucose, Bld: 133 mg/dL — ABNORMAL HIGH (ref 70–99)
Potassium: 3.5 meq/L (ref 3.5–5.1)
Sodium: 139 meq/L (ref 135–145)

## 2023-07-23 LAB — VITAMIN D 25 HYDROXY (VIT D DEFICIENCY, FRACTURES): VITD: 49.69 ng/mL (ref 30.00–100.00)

## 2023-07-23 NOTE — Progress Notes (Signed)
 Established Patient Office Visit   Subjective  Patient ID: Katherine [name], female    DOB: 1950-06-19  Age: 73 y.o. MRN: [id number]  Chief Complaint  Patient presents with   Medical Management of Chronic Issues    Patient is a 73 year old female seen for follow-up. Pt s/p ureteral stent placement and uroscopy for renal calculi removal.  Denies current hematuria.  Has not yet  returned to swimming for exercise.  Endorses increased fatigue, change in appetite.    Patient Active Problem List   Diagnosis Date Noted   Lumbar spinal stenosis 11/06/2022   Diet-controlled diabetes mellitus (HCC) 03/11/2022   Mixed hyperlipidemia 03/11/2022   Cataract of left eye 03/11/2022   Spinal stenosis, lumbar region with neurogenic claudication 02/05/2021   Prediabetes 11/06/2019   Peripheral neuropathy 02/09/2019   Chronic right-sided low back pain with right-sided sciatica 02/09/2019   S/P revision of total knee 11/28/2018   S/P total knee replacement 05/02/2018   Body mass index 40.0-44.9, adult (HCC) 10/22/2017   Closed displaced oblique fracture of shaft of ulna with malunion, subsequent encounter 07/28/2017   Ulnar impaction syndrome, right 07/07/2017   Arthritis of right wrist 07/07/2017   Pain in right wrist 06/01/2017   Trigger finger, right index finger 09/28/2016   Impaired glucose tolerance 09/09/2015   Hyperparathyroidism (HCC) 05/26/2013   Hereditary and idiopathic peripheral neuropathy 12/29/2012   Morbid obesity (HCC)    Menopausal symptoms 11/10/2011   DIVERTICULOSIS, COLON 11/02/2007   Dyslipidemia 03/09/2006   Essential hypertension 03/09/2006   NEPHROLITHIASIS, HX OF 03/09/2006   Past Medical History:  Diagnosis Date   Cataract    LEFT,REMOVED   Complication of anesthesia     neuropathy in feet flares after surgery from PAS hose. needs cream on feet   DDD (degenerative disc disease), lumbar 2012   Dental crowns present    Diabetes mellitus without complication  (HCC)    History of anemia 1991   not since hysterectomy   History of gross hematuria 2002   with kidney stones   History of hyperparathyroidism    History of kidney stones    Hyperlipidemia    Hypertension    states under control with meds., has been on med. x 20 yr.   Impingement syndrome of right shoulder 2017   Lymphedema of both lower extremities    due to neuropathy   Lymphedema of both upper extremities    due to neuropathy   Obese    Osteoarthritis    Peripheral neuropathy    hands and feet   Sciatica    Triangular fibrocartilage complex tear 06/2017   right   Ulnar impaction syndrome, right 06/2017   Wears partial dentures    upper and lower   Past Surgical History:  Procedure Laterality Date   ABDOMINAL HYSTERECTOMY  1991   partial   CARPAL TUNNEL RELEASE Right 08/09/2002   CARPAL TUNNEL RELEASE Left 11/03/2006   CATARACT EXTRACTION Left 07/21/2021   COLONOSCOPY  01/2016   COLONOSCOPY WITH PROPOFOL  11/24/2011   CYSTOSCOPY WITH RETROGRADE PYELOGRAM, URETEROSCOPY AND STENT PLACEMENT Left 12/18/2014   Procedure: CYSTOSCOPY WITH RETROGRADE PYELOGRAM, URETEROSCOPY AND STENT PLACEMENT left ureter;  Surgeon: Heloise Purpura, MD;  Location: [facility];  Service: Urology;  Laterality: Left;   CYSTOSCOPY/URETEROSCOPY/HOLMIUM LASER/STENT PLACEMENT Left 07/08/2023   Procedure: CYSTOSCOPY/ LEFT URETEROSCOPY/HOLMIUM LASER/STENT PLACEMENT, LEFT RETROGRADE PYELOGRAM;  Surgeon: Heloise Purpura, MD;  Location: [facility];  Service: Urology;  Laterality: Left;  60 MINUTE CASE   DILATION AND  CURETTAGE OF UTERUS     ELBOW ARTHROSCOPY Right    EXTRACORPOREAL SHOCK WAVE LITHOTRIPSY Left 03/08/2017   Procedure: LEFT EXTRACORPOREAL SHOCK WAVE LITHOTRIPSY (ESWL);  Surgeon: Heloise Purpura, MD;  Location: [facility];  Service: Urology;  Laterality: Left;   HOLMIUM LASER APPLICATION Left 12/18/2014   Procedure: HOLMIUM LASER APPLICATION left ureter ;  Surgeon: Heloise Purpura, MD;  Location: [facility];   Service: Urology;  Laterality: Left;   LUMBAR LAMINECTOMY/DECOMPRESSION MICRODISCECTOMY N/A 11/06/2022   Procedure: OPEN LUMBAR LAMINECTOMY, Lumbar two-three;  Surgeon: Dawley, Alan Mulder, DO;  Location: [facility];  Service: Neurosurgery;  Laterality: N/A;   PARATHYROIDECTOMY Right 05/03/2015   Procedure: RIGHT SUPERIOR PARATHYROIDECTOMY;  Surgeon: Darnell Level, MD;  Location: [facility];  Service: General;  Laterality: Right;   PATELLA REALIGNMENT Bilateral    as a teenager   ROTATOR CUFF REPAIR Right    x2   SHOULDER ARTHROSCOPY Right    x 2   SHOULDER ARTHROSCOPY W/ ROTATOR CUFF REPAIR Left 04/26/2002   SYNOVECTOMY WITH POLY EXCHANGE Left 11/28/2018   Procedure: SYNOVECTOMY WITH Levora Angel;  Surgeon: Dannielle Huh, MD;  Location: [facility];  Service: Orthopedics;  Laterality: Left;   TONSILLECTOMY     TOTAL KNEE ARTHROPLASTY Left 09/10/2003   TOTAL KNEE ARTHROPLASTY Right 11/10/2004   TOTAL KNEE ARTHROPLASTY Right 05/02/2018   Procedure: Right polyethelene exchange and open synovectomy;  Surgeon: Dannielle Huh, MD;  Location: [facility];  Service: Orthopedics;  Laterality: Right;   TRIGGER FINGER RELEASE Right 08/06/2008   ring finger   TRIGGER FINGER RELEASE     multiple - right thumb, left index/long/ring/thumb   ULNA OSTEOTOMY Right 07/28/2017   Procedure: REVISION RIGHT ULNAR SHORTENING OSTEOTOMY;  Surgeon: Tarry Kos, MD;  Location: [facility];  Service: Orthopedics;  Laterality: Right;   WRIST ARTHROSCOPY Left 06/16/2000   WRIST ARTHROSCOPY WITH ULNA SHORTENING Right 07/07/2017   Procedure: RIGHT WRIST ARTHROSCOPY WITH ULNAR SHORTENING OSTEOTOMY;  Surgeon: Tarry Kos, MD;  Location:  SURGERY CENTER;  Service: Orthopedics;  Laterality: Right;   Social History   Tobacco Use   Smoking status: Never   Smokeless tobacco: Never  Vaping Use   Vaping status: Never Used  Substance Use Topics   Alcohol use: Never   Drug use: Never   Family History  Problem Relation  Age of Onset   Stroke Mother    Cancer Mother 22       bone cancer   Other Father        unknown medical history   Diabetes Sister    Colon cancer Brother    Liver cancer Brother    Colon polyps Child    Crohn's disease Neg Hx    Esophageal cancer Neg Hx    Rectal cancer Neg Hx    Stomach cancer Neg Hx    Ulcerative colitis Neg Hx    Allergies  Allergen Reactions   Erythromycin Other (See Comments)    CHEST PAIN   Paxil [Paroxetine] Other (See Comments)    Repeated falls      ROS Negative unless stated above    Objective:     BP 112/70 (BP Location: Left Arm, Patient Position: Sitting, Cuff Size: Normal)   Pulse (!) 113   Temp 98.4 F (36.9 C) (Oral)   Ht 5' 4.5" (1.638 m)   Wt 243 lb 3.2 oz (110.3 kg)   SpO2 95%   BMI 41.10 kg/m  BP Readings from Last 3 Encounters:  07/23/23 112/70  07/08/23 (!) 143/99  07/06/23 120/69   Wt Readings from Last 3 Encounters:  07/23/23 243 lb 3.2 oz (110.3 kg)  07/08/23 245 lb (111.1 kg)  07/06/23 245 lb (111.1 kg)   SpO2 Readings from Last 3 Encounters:  07/23/23 95%  07/08/23 97%  07/06/23 98%      Physical Exam   No results found for any visits on 07/23/23.    Assessment & Plan:  Essential hypertension -     Basic metabolic panel  Diet-controlled diabetes mellitus (HCC)  S/P cystoscopy with ureteral stent placement -     CBC with Differential/Platelet -     Basic metabolic panel  Fatigue, unspecified type -     CBC with Differential/Platelet -     Iron, TIBC and Ferritin Panel -     Basic metabolic panel -     VITAMIN D 25 Hydroxy (Vit-D Deficiency, Fractures)   DM diet controlled.  Hgb A1C 6.6% on 07/06/23. Will wait on obtaining microalbumin/creatinine ratio given recent cystoscopy for renal calculi removal with stent placement.  Labs to evaluate fatigue.  For continued symptoms consider d-dimer and CTA.  BP well controlled.  Continue current meds including lisinopril 10 mg, hydrochlorothiazide 12.5  mg. Continue lifestyle modifications including water aerobics.         Return in about 3 months (around 10/23/2023), or if symptoms worsen or fail to improve.   Deeann Saint, MD

## 2023-07-24 LAB — IRON,TIBC AND FERRITIN PANEL
%SAT: 25 % (ref 16–45)
Ferritin: 120 ng/mL (ref 16–288)
Iron: 69 ug/dL (ref 45–160)
TIBC: 273 ug/dL (ref 250–450)

## 2023-07-26 ENCOUNTER — Encounter: Payer: Self-pay | Admitting: Family Medicine

## 2023-09-03 ENCOUNTER — Other Ambulatory Visit: Payer: Self-pay | Admitting: Family Medicine

## 2023-09-03 DIAGNOSIS — G609 Hereditary and idiopathic neuropathy, unspecified: Secondary | ICD-10-CM

## 2023-09-27 ENCOUNTER — Ambulatory Visit (INDEPENDENT_AMBULATORY_CARE_PROVIDER_SITE_OTHER): Admitting: Podiatry

## 2023-09-27 ENCOUNTER — Encounter: Payer: Self-pay | Admitting: Podiatry

## 2023-09-27 ENCOUNTER — Ambulatory Visit: Payer: Medicare Other | Admitting: Podiatry

## 2023-09-27 DIAGNOSIS — M25572 Pain in left ankle and joints of left foot: Secondary | ICD-10-CM

## 2023-09-27 DIAGNOSIS — M25571 Pain in right ankle and joints of right foot: Secondary | ICD-10-CM

## 2023-09-27 DIAGNOSIS — E1151 Type 2 diabetes mellitus with diabetic peripheral angiopathy without gangrene: Secondary | ICD-10-CM | POA: Diagnosis not present

## 2023-09-27 DIAGNOSIS — I70209 Unspecified atherosclerosis of native arteries of extremities, unspecified extremity: Secondary | ICD-10-CM

## 2023-09-27 DIAGNOSIS — L600 Ingrowing nail: Secondary | ICD-10-CM | POA: Diagnosis not present

## 2023-09-27 NOTE — Progress Notes (Signed)
 Presents for exam..  She has type II diabetes mellitus with neuropathy.  He has burning and numbness in the feet.  Complain of a loose nail that she has to keep trimmed back with a thick nail on the hallux right.  Some aching and soreness around the ankles.   Physical exam:  General appearance: Pleasant, and in no acute distress. AOx3.  Vascular: Pedal pulses: DP palpable bilaterally, PT nonpalpable bilaterally.  Severe edema lower legs bilaterally. Capillary fill time immediate.  Neurological: Absent Wittgenstein-senn monofilament sensation forefoot bilaterally and rear right.  Reduced Achilles tendon reflex bilaterally paresthesias and burning noted.  Negative Tinel's sign at tarsal tunnel bilaterally, porta pedis bilaterally.  Decreased vibratory sensation in feet bilaterally  Dermatologic:   Skin normal temperature bilaterally.  Skin normal color, tone, and texture bilaterally.   Musculoskeletal: Hammertoe deformities fifth toes bilaterally.  Soreness with palpation of the sinus tarsi and with range of motion bilaterally.  Radiographs: None  Diagnosis: 1.  Ingrown nail hallux right 2.  Arthralgia subtalar joint bilaterally. 3.  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 - Discussed the ingrown nail hallux right discussed conservative versus surgical treatment.  Discussed matrixectomy if this becomes more of a problem. - Prescription given for diabetic shoes and custom insoles bilaterally. - Discussed looking out for any signs of ulceration or infection feet. - Her neuropathy appears to be well   Return as needed

## 2023-09-27 NOTE — Progress Notes (Signed)
 Presents with complaint of neuropathy in her feet.  She is diabetic.  Gets numbness and burning and aching pains in the foot.  Some pain around the ankle.   Physical exam:  General appearance: Pleasant, and in no acute distress. AOx3.  Vascular: Pedal pulses: DP palpable bilaterally, PT nonpalpable bilaterally.  Severe edema lower legs bilaterally. Capillary fill time immediate.  Neurological: Absent Weinstein sensation forefoot bilaterally rear foot the left.  Vibratory sensation decreased bilaterally reduced Achilles tendon reflex.  Paresthesias and burning  Dermatologic:   Skin thin and atrophic with no hair growth lower extremity.  Hyper pigmentation lower extremity bilaterally.  Ingrown nail hallux right toe with onychomycotic.  Nail is partially loose to the nail but very dystrophic.  Some redness around it.  No signs of infection  Musculoskeletal: Hammertoes fifth toes bilaterally.  Some tenderness at sinus tarsi with palpation.  Radiographs: None  Diagnosis: 1.  Neuritis secondary to diabetic neuropathy. 2.  Ingrown nail hallux right. 3.  Arthralgia subtalar joint bilaterally did not do anything  Plan: -Establish office visit level 3 for evaluation and management -Gave prescription for diabetic shoes and insoles bilaterally -Discussed proper shoes and socks.  Discussed things to look out for as far as ulceration or infection. -Her neuropathy seems to be well-managed -Discussed reviewed the ingrown nail on the hallux right.  Discussed with her options including debriding the nail and permanently removing it.  At this point she just like to keep it trimmed down.   Return as needed

## 2024-01-12 ENCOUNTER — Telehealth: Payer: Self-pay | Admitting: Podiatry

## 2024-01-12 LAB — HM MAMMOGRAPHY

## 2024-01-12 NOTE — Telephone Encounter (Signed)
 Pt called- she came in 09/2023 and was told she'd hear back from someone about shoes and insoles. In the visit note, it says an Rx was written and was given to pt... pt said she doesn't have it she was never given the prescription.

## 2024-01-24 ENCOUNTER — Telehealth: Payer: Self-pay | Admitting: Family Medicine

## 2024-01-24 NOTE — Telephone Encounter (Signed)
 Copied from CRM [id number]. Topic: Clinical - Medication Refill >> Jan 24, 2024 11:56 AM Chasity T wrote: Medication: pregabalin  (LYRICA ) 150 MG capsule pregabalin  (LYRICA ) 25 MG capsule lidocaine  (XYLOCAINE ) 5 % ointment  Has the patient contacted their pharmacy? Yes   This is the patient's preferred pharmacy:    [facility] - [place], [place] - [address] [facility] [address] [place] [address] [place] [address] Phone: [phone] Fax: [phone]   Is this the correct pharmacy for this prescription? Yes If no, delete pharmacy and type the correct one.   Has the prescription been filled recently? No  Is the patient out of the medication? Yes  Has the patient been seen for an appointment in the last year OR does the patient have an upcoming appointment? Yes  Can we respond through MyChart? Yes  Agent: Please be advised that Rx refills may take up to 3 business days. We ask that you follow-up with your pharmacy.

## 2024-02-02 ENCOUNTER — Other Ambulatory Visit: Payer: Self-pay

## 2024-02-02 ENCOUNTER — Ambulatory Visit: Payer: Medicare Other

## 2024-02-02 VITALS — BP 122/62 | HR 99 | Temp 98.3°F | Ht 64.5 in | Wt 251.6 lb

## 2024-02-02 DIAGNOSIS — Z0000 Encounter for general adult medical examination without abnormal findings: Secondary | ICD-10-CM

## 2024-02-02 DIAGNOSIS — Z23 Encounter for immunization: Secondary | ICD-10-CM | POA: Diagnosis not present

## 2024-02-02 DIAGNOSIS — G609 Hereditary and idiopathic neuropathy, unspecified: Secondary | ICD-10-CM

## 2024-02-02 NOTE — Progress Notes (Signed)
 Subjective:   Katherine [name] is a 73 y.o. who presents for a Medicare Wellness preventive visit.  As a reminder, Annual Wellness Visits don't include a physical exam, and some assessments may be limited, especially if this visit is performed virtually. We may recommend an in-person follow-up visit with your provider if needed.  Visit Complete: In person    Persons Participating in Visit: Patient.  AWV Questionnaire: Yes: Patient Medicare AWV questionnaire was completed by the patient on 01/31/24; I have confirmed that all information answered by patient is correct and no changes since this date.  Cardiac Risk Factors include: advanced age (>49men, >26 women);diabetes mellitus;hypertension     Objective:    Today's Vitals   02/02/24 1358  BP: 122/62  Pulse: 99  Temp: 98.3 F (36.8 C)  TempSrc: Oral  SpO2: 96%  Weight: 251 lb 9.6 oz (114.1 kg)  Height: 5' 4.5 (1.638 m)   Body mass index is 42.52 kg/m.     02/02/2024    2:09 PM 07/06/2023    3:02 PM 01/29/2023    2:37 PM 10/28/2022    8:23 AM 01/13/2022   11:42 AM 01/10/2021    9:06 AM 12/28/2019    9:14 AM  Advanced Directives  Does Patient Have a Medical Advance Directive? Yes Yes Yes Yes Yes Yes Yes  Type of Estate agent of [place];Living will Healthcare Power of [place];Living will Healthcare Power of [place];Living will Healthcare Power of [place];Living will Healthcare Power of [place];Living will Healthcare Power of [place];Living will Healthcare Power of [place];Living will  Does patient want to make changes to medical advance directive? No - Patient declined  No - Patient declined No - Patient declined   No - Patient declined  Copy of Healthcare Power of Attorney in Chart? Yes - validated most recent copy scanned in chart (See row information)  Yes - validated most recent copy scanned in chart (See row information) No - copy requested Yes - validated most recent copy scanned in chart (See  row information) No - copy requested Yes - validated most recent copy scanned in chart (See row information)    Current Medications (verified) Outpatient Encounter Medications as of 02/02/2024  Medication Sig   Cholecalciferol (VITAMIN D ) 50 MCG (2000 UT) tablet Take 2,000 Units by mouth in the morning.   cycloSPORINE  (RESTASIS ) 0.05 % ophthalmic emulsion Place 1 drop into both eyes 2 (two) times daily.   fexofenadine  (ALLEGRA  ALLERGY) 60 MG tablet Take 1 tablet (60 mg total) by mouth daily. (Patient taking differently: Take 60 mg by mouth daily as needed for allergies.)   fluticasone  (FLONASE ) 50 MCG/ACT nasal spray Place 1 spray into both nostrils daily. (Patient taking differently: Place 1 spray into both nostrils daily as needed for allergies.)   hydrochlorothiazide  (HYDRODIURIL ) 12.5 MG tablet Take 12.5 mg by mouth daily.   lidocaine  (XYLOCAINE ) 5 % ointment APPLY 1 APPLICATION TOPICALLY 3  TIMES DAILY AS NEEDED   lidocaine -prilocaine  (EMLA ) cream Apply 1 application topically to affected areas 3 times daily as needed.   lisinopril  (ZESTRIL ) 10 MG tablet TAKE 1 TABLET BY MOUTH DAILY   Multiple Vitamin (MULTIVITAMIN WITH MINERALS) TABS tablet Take 1 tablet by mouth in the morning.   nortriptyline  (PAMELOR ) 25 MG capsule TAKE 3 CAPSULES BY MOUTH AT  BEDTIME   ondansetron  (ZOFRAN ) 4 MG tablet Take 4 mg by mouth every 8 (eight) hours as needed for nausea or vomiting.   oxyCODONE -acetaminophen  (PERCOCET/ROXICET) 5-325 MG tablet Take 1 tablet by  mouth every 6 (six) hours as needed for severe pain (pain score 7-10). (Patient not taking: Reported on 09/27/2023)   pregabalin  (LYRICA ) 150 MG capsule TAKE 1 CAPSULE BY MOUTH TWICE  DAILY   pregabalin  (LYRICA ) 25 MG capsule Take 1 capsule (25 mg total) by mouth at bedtime. To be taken with 150 mg cap for a total dose of 175 mg daily.   traMADol  (ULTRAM ) 50 MG tablet Take 50 mg by mouth every 6 (six) hours as needed for severe pain (pain score 7-10).   No  facility-administered encounter medications on file as of 02/02/2024.    Allergies (verified) Erythromycin and Paxil [paroxetine]   History: Past Medical History:  Diagnosis Date   Cataract    LEFT,REMOVED   Complication of anesthesia     neuropathy in feet flares after surgery from PAS hose. needs cream on feet   DDD (degenerative disc disease), lumbar 2012   Dental crowns present    Diabetes mellitus without complication (HCC)    History of anemia 1991   not since hysterectomy   History of gross hematuria 2002   with kidney stones   History of hyperparathyroidism    History of kidney stones    Hyperlipidemia    Hypertension    states under control with meds., has been on med. x 20 yr.   Impingement syndrome of right shoulder 2017   Lymphedema of both lower extremities    due to neuropathy   Lymphedema of both upper extremities    due to neuropathy   Obese    Osteoarthritis    Peripheral neuropathy    hands and feet   Sciatica    Triangular fibrocartilage complex tear 06/2017   right   Ulnar impaction syndrome, right 06/2017   Wears partial dentures    upper and lower   Past Surgical History:  Procedure Laterality Date   ABDOMINAL HYSTERECTOMY  1991   partial   CARPAL TUNNEL RELEASE Right 08/09/2002   CARPAL TUNNEL RELEASE Left 11/03/2006   CATARACT EXTRACTION Left 07/21/2021   COLONOSCOPY  01/2016   COLONOSCOPY WITH PROPOFOL   11/24/2011   CYSTOSCOPY WITH RETROGRADE PYELOGRAM, URETEROSCOPY AND STENT PLACEMENT Left 12/18/2014   Procedure: CYSTOSCOPY WITH RETROGRADE PYELOGRAM, URETEROSCOPY AND STENT PLACEMENT left ureter;  Surgeon: Gretel Ferrara, MD;  Location: [facility];  Service: Urology;  Laterality: Left;   CYSTOSCOPY/URETEROSCOPY/HOLMIUM LASER/STENT PLACEMENT Left 07/08/2023   Procedure: CYSTOSCOPY/ LEFT URETEROSCOPY/HOLMIUM LASER/STENT PLACEMENT, LEFT RETROGRADE PYELOGRAM;  Surgeon: Ferrara Gretel, MD;  Location: [facility];  Service: Urology;  Laterality: Left;  58  MINUTE CASE   DILATION AND CURETTAGE OF UTERUS     ELBOW ARTHROSCOPY Right    EXTRACORPOREAL SHOCK WAVE LITHOTRIPSY Left 03/08/2017   Procedure: LEFT EXTRACORPOREAL SHOCK WAVE LITHOTRIPSY (ESWL);  Surgeon: Ferrara Gretel, MD;  Location: [facility];  Service: Urology;  Laterality: Left;   HOLMIUM LASER APPLICATION Left 12/18/2014   Procedure: HOLMIUM LASER APPLICATION left ureter ;  Surgeon: Gretel Ferrara, MD;  Location: [facility];  Service: Urology;  Laterality: Left;   LUMBAR LAMINECTOMY/DECOMPRESSION MICRODISCECTOMY N/A 11/06/2022   Procedure: OPEN LUMBAR LAMINECTOMY, Lumbar two-three;  Surgeon: Dawley, Lani BROCKS, DO;  Location: [facility];  Service: Neurosurgery;  Laterality: N/A;   PARATHYROIDECTOMY Right 05/03/2015   Procedure: RIGHT SUPERIOR PARATHYROIDECTOMY;  Surgeon: Krystal Spinner, MD;  [facility];  Service: General;  Laterality: Right;   PATELLA REALIGNMENT Bilateral    as a teenager   ROTATOR CUFF REPAIR Right    x2   SHOULDER  ARTHROSCOPY Right    x 2   SHOULDER ARTHROSCOPY W/ ROTATOR CUFF REPAIR Left 04/26/2002   SYNOVECTOMY WITH POLY EXCHANGE Left 11/28/2018   Procedure: SYNOVECTOMY WITH ERLINE REASONER;  Surgeon: Rubie Kemps, MD;  Location: [facility];  Service: Orthopedics;  Laterality: Left;   TONSILLECTOMY     TOTAL KNEE ARTHROPLASTY Left 09/10/2003   TOTAL KNEE ARTHROPLASTY Right 11/10/2004   TOTAL KNEE ARTHROPLASTY Right 05/02/2018   Procedure: Right polyethelene exchange and open synovectomy;  Surgeon: Rubie Kemps, MD;  Location: [facility];  Service: Orthopedics;  Laterality: Right;   TRIGGER FINGER RELEASE Right 08/06/2008   ring finger   TRIGGER FINGER RELEASE     multiple - right thumb, left index/long/ring/thumb   ULNA OSTEOTOMY Right 07/28/2017   Procedure: REVISION RIGHT ULNAR SHORTENING OSTEOTOMY;  Surgeon: Jerri Kay HERO, MD;  Location: [facility];  Service: Orthopedics;  Laterality: Right;   WRIST ARTHROSCOPY Left 06/16/2000   WRIST ARTHROSCOPY WITH ULNA  SHORTENING Right 07/07/2017   Procedure: RIGHT WRIST ARTHROSCOPY WITH ULNAR SHORTENING OSTEOTOMY;  Surgeon: Jerri Kay HERO, MD;  Location: [facility];  Service: Orthopedics;  Laterality: Right;   Family History  Problem Relation Age of Onset   Stroke Mother    Cancer Mother 22       bone cancer   Other Father        unknown medical history   Diabetes Sister    Colon cancer Brother    Liver cancer Brother    Colon polyps Child    Crohn's disease Neg Hx    Esophageal cancer Neg Hx    Rectal cancer Neg Hx    Stomach cancer Neg Hx    Ulcerative colitis Neg Hx    Social History   Socioeconomic History   Marital status: Single    Spouse name: Not on file   Number of children: 1   Years of education: some college   Highest education level: Associate degree: occupational, Scientist, product/process development, or vocational program  Occupational History   Occupation: Retired in 2011    Employer: [employer]  Tobacco Use   Smoking status: Never   Smokeless tobacco: Never  Vaping Use   Vaping status: Never Used  Substance and Sexual Activity   Alcohol use: Never   Drug use: Never   Sexual activity: Never    Birth control/protection: Surgical  Other Topics Concern   Not on file  Social History Narrative   Patient is single and lives with her brother.    Patient is right handed.   Caffeine - no daily use.   Social Drivers of Corporate investment banker Strain: Low Risk  (02/02/2024)   Overall Financial Resource Strain (CARDIA)    Difficulty of Paying Living Expenses: Not hard at all  Food Insecurity: No Food Insecurity (02/02/2024)   Hunger Vital Sign    Worried About Running Out of Food in the Last Year: Never true    Ran Out of Food in the Last Year: Never true  Transportation Needs: No Transportation Needs (02/02/2024)   PRAPARE - Administrator, Civil Service (Medical): No    Lack of Transportation (Non-Medical): No  Physical Activity: Inactive (02/02/2024)   Exercise  Vital Sign    Days of Exercise per Week: 0 days    Minutes of Exercise per Session: Not on file  Stress: No Stress Concern Present (02/02/2024)   [employer] of Occupational Health - Occupational Stress Questionnaire  Feeling of Stress: Only a little  Social Connections: Moderately Integrated (02/02/2024)   Social Connection and Isolation Panel    Frequency of Communication with Friends and Family: More than three times a week    Frequency of Social Gatherings with Friends and Family: Once a week    Attends Religious Services: More than 4 times per year    Active Member of [employer] or Organizations: Yes    Attends Engineer, structural: More than 4 times per year    Marital Status: Never married    Tobacco Counseling Counseling given: Not Answered    Clinical Intake:  Pre-visit preparation completed: Yes  Pain : No/denies pain     BMI - recorded: 42.52 Nutritional Status: BMI > 30  Obese Nutritional Risks: None Diabetes: Yes CBG done?: No Did pt. bring in CBG monitor from home?: No  Lab Results  Component Value Date   HGBA1C 6.6 (H) 07/06/2023   HGBA1C 6.8 (H) 10/28/2022   HGBA1C 6.2 (A) 06/15/2022     How often do you need to have someone help you when you read instructions, pamphlets, or other written materials from your doctor or pharmacy?: 1 - Never  Interpreter Needed?: No  Information entered by :: Rojelio Blush LPN   Activities of Daily Living     02/02/2024    2:06 PM 01/31/2024    8:20 AM  In your present state of health, do you have any difficulty performing the following activities:  Hearing? 0 0  Vision? 0 0  Difficulty concentrating or making decisions? 0 0  Walking or climbing stairs? 1 1  Comment Uses a [place]   Dressing or bathing? 0 0  Doing errands, shopping? 0 0  Preparing Food and eating ? CINDERELLA CINDERELLA  Comment Daughter assist   Using the Toilet? N N  In the past six months, have you accidently leaked urine? N N  Do you have  problems with loss of bowel control? N N  Managing your Medications? N N  Managing your Finances? N N  Housekeeping or managing your Housekeeping? CINDERELLA CINDERELLA Lenise Texie assist     Patient Care Team: Mercer Clotilda SAUNDERS, MD as PCP - General (Family Medicine) Lionell Jon DEL, [facility] (Pharmacist)  I have updated your Care Teams any recent Medical Services you may have received from other providers in the past year.     Assessment:   This is a routine wellness examination for [place].  Hearing/Vision screen Hearing Screening - Comments:: Denies hearing difficulties   Vision Screening - Comments:: Wears rx glasses - up to date with routine eye exams with  Dr Vonzell   Goals Addressed               This Visit's Progress     Increase physical activity (pt-stated)        Lose weight.       Depression Screen     02/02/2024    2:10 PM 07/23/2023   11:16 AM 04/26/2023    3:17 PM 01/29/2023    2:28 PM 09/14/2022    9:05 AM 06/15/2022    9:35 AM 01/13/2022   11:43 AM  PHQ 2/9 Scores  PHQ - 2 Score 0 4 0 0 1 2 0  PHQ- 9 Score  12 2  2 3      Fall Risk     02/02/2024    2:08 PM 01/31/2024    8:20 AM 07/23/2023   11:15 AM 04/26/2023  3:17 PM 01/25/2023   10:13 AM  Fall Risk   Falls in the past year? 0 0 1 1 1   Number falls in past yr: 0 0 0 1 1  Injury with Fall? 0 0 0 0 0  Risk for fall due to : No Fall Risks      Follow up Falls evaluation completed  Falls evaluation completed Falls evaluation completed     MEDICARE RISK AT HOME:  Medicare Risk at Home Any stairs in or around the home?: No If so, are there any without handrails?: No Home free of loose throw rugs in walkways, pet beds, electrical cords, etc?: Yes Adequate lighting in your home to reduce risk of falls?: Yes Life alert?: No Use of a cane, walker or w/c?: Yes Grab bars in the bathroom?: Yes Shower chair or bench in shower?: Yes Elevated toilet seat or a handicapped toilet?: No  TIMED UP AND GO:  Was the  test performed?  Yes  Length of time to ambulate 10 feet: 10 sec Gait slow and steady with assistive device  Cognitive Function: 6CIT completed        02/02/2024    2:09 PM 01/29/2023    2:37 PM 01/13/2022   11:44 AM 12/28/2019    9:21 AM  6CIT Screen  What Year? 0 points 0 points 0 points 0 points  What month? 0 points 0 points 0 points 0 points  What time? 0 points 0 points 0 points 0 points  Count back from 20 0 points 0 points 0 points 0 points  Months in reverse 0 points 0 points 0 points 0 points  Repeat phrase 0 points 0 points 2 points 2 points  Total Score 0 points 0 points 2 points 2 points    Immunizations Immunization History  Administered Date(s) Administered   Fluad Quad(high Dose 65+) 03/09/2019, 01/24/2020, 01/30/2021, 02/16/2022   Fluad Trivalent(High Dose 65+) 01/29/2023   INFLUENZA, HIGH DOSE SEASONAL PF 03/11/2016, 03/24/2017, 02/09/2018, 02/02/2024   Influenza Split 02/18/2011, 05/26/2013   Influenza,inj,Quad PF,6+ Mos 05/29/2014, 03/12/2015, 05/28/2015   Moderna Sars-Covid-2 Vaccination 06/02/2019, 07/07/2019, 03/30/2020   Pneumococcal Conjugate-13 05/31/2014   Pneumococcal Polysaccharide-23 06/22/2016   Td 01/09/2006   Tdap 03/01/2018   Zoster, Live 02/18/2011    Screening Tests Health Maintenance  Topic Date Due   Diabetic kidney evaluation - Urine ACR  Never done   Zoster Vaccines- Shingrix (1 of 2) 04/14/1999   FOOT EXAM  09/23/2023   OPHTHALMOLOGY EXAM  10/01/2023   COVID-19 Vaccine (4 - 2025-26 season) 01/10/2024   HEMOGLOBIN A1C  01/03/2024   Diabetic kidney evaluation - eGFR measurement  07/22/2024   Mammogram  01/11/2025   Medicare Annual Wellness (AWV)  02/01/2025   DTaP/Tdap/Td (3 - Td or Tdap) 03/01/2028   Pneumococcal Vaccine: 50+ Years  Completed   Influenza Vaccine  Completed   DEXA SCAN  Completed   Hepatitis C Screening  Completed   HPV VACCINES  Aged Out   Meningococcal B Vaccine  Aged Out   Colonoscopy  Discontinued     Health Maintenance Items Addressed:   Additional Screening:  Vision Screening: Recommended annual ophthalmology exams for early detection of glaucoma and other disorders of the eye. Is the patient up to date with their annual eye exam?  Yes  Who is the provider or what is the name of the office in which the patient attends annual eye exams? Dr Vonzell  Dental Screening: Recommended annual dental exams for proper oral hygiene  Community Resource Referral / Chronic Care Management: CRR required this visit?  No   CCM required this visit?  No   Plan:    I have personally reviewed and noted the following in the patient's chart:   Medical and social history Use of alcohol, tobacco or illicit drugs  Current medications and supplements including opioid prescriptions. Patient is currently taking opioid prescriptions. Information provided to patient regarding non-opioid alternatives. Patient advised to discuss non-opioid treatment plan with their provider. Functional ability and status Nutritional status Physical activity Advanced directives List of other physicians Hospitalizations, surgeries, and ER visits in previous 12 months Vitals Screenings to include cognitive, depression, and falls Referrals and appointments  In addition, I have reviewed and discussed with patient certain preventive protocols, quality metrics, and best practice recommendations. A written personalized care plan for preventive services as well as general preventive health recommendations were provided to patient.   Rojelio LELON Blush, LPN   [id number]   After Visit Summary: (In Person-Printed) AVS printed and given to the patient  Notes: Nothing significant to report at this time.

## 2024-02-02 NOTE — Patient Instructions (Addendum)
 Katherine [name],  Thank you for taking the time for your Medicare Wellness Visit. I appreciate your continued commitment to your health goals. Please review the care plan we discussed, and feel free to reach out if I can assist you further.  Medicare recommends these wellness visits once per year to help you and your care team stay ahead of potential health issues. These visits are designed to focus on prevention, allowing your provider to concentrate on managing your acute and chronic conditions during your regular appointments.  Please note that Annual Wellness Visits do not include a physical exam. Some assessments may be limited, especially if the visit was conducted virtually. If needed, we may recommend a separate in-person follow-up with your provider.  Ongoing Care Seeing your primary care provider every 3 to 6 months helps us  monitor your health and provide consistent, personalized care.   Referrals If a referral was made during today's visit and you haven't received any updates within two weeks, please contact the referred provider directly to check on the status.  Recommended Screenings:  Health Maintenance  Topic Date Due   Yearly kidney health urinalysis for diabetes  Never done   Zoster (Shingles) Vaccine (1 of 2) 04/14/1999   Complete foot exam   09/23/2023   Eye exam for diabetics  10/01/2023   COVID-19 Vaccine (4 - 2025-26 season) 01/10/2024   Hemoglobin A1C  01/03/2024   Yearly kidney function blood test for diabetes  07/22/2024   Breast Cancer Screening  01/11/2025   Medicare Annual Wellness Visit  02/01/2025   DTaP/Tdap/Td vaccine (3 - Td or Tdap) 03/01/2028   Pneumococcal Vaccine for age over 89  Completed   Flu Shot  Completed   DEXA scan (bone density measurement)  Completed   Hepatitis C Screening  Completed   HPV Vaccine  Aged Out   Meningitis B Vaccine  Aged Out   Colon Cancer Screening  Discontinued   Opioid Pain Medicine Management Opioids are powerful  medicines that are used to treat moderate to severe pain. When used for short periods of time, they can help you to: Sleep better. Do better in physical or occupational therapy. Feel better in the first few days after an injury. Recover from surgery. Opioids should be taken with the supervision of a trained health care provider. They should be taken for the shortest period of time possible. This is because opioids can be addictive, and the longer you take opioids, the greater your risk of addiction. This addiction can also be called opioid use disorder. What are the risks? Using opioid pain medicines for longer than 3 days increases your risk of side effects. Side effects include: Constipation. Nausea and vomiting. Breathing difficulties (respiratory depression). Drowsiness. Confusion. Opioid use disorder. Itching. Taking opioid pain medicine for a long period of time can affect your ability to do daily tasks. It also puts you at risk for: Motor vehicle crashes. Depression. Suicide. Heart attack. Overdose, which can be life-threatening. What is a pain treatment plan? A pain treatment plan is an agreement between you and your health care provider. Pain is unique to each person, and treatments vary depending on your condition. To manage your pain, you and your health care provider need to work together. To help you do this: Discuss the goals of your treatment, including how much pain you might expect to have and how you will manage the pain. Review the risks and benefits of taking opioid medicines. Remember that a good treatment plan uses more  than one approach and minimizes the chance of side effects. Be honest about the amount of medicines you take and about any drug or alcohol use. Get pain medicine prescriptions from only one health care provider. Pain can be managed with many types of alternative treatments. Ask your health care provider to refer you to one or more specialists who can  help you manage pain through: Physical or occupational therapy. Counseling (cognitive behavioral therapy). Good nutrition. Biofeedback. Massage. Meditation. Non-opioid medicine. Following a gentle exercise program. How to use opioid pain medicine Taking medicine Take your pain medicine exactly as told by your health care provider. Take it only when you need it. If your pain gets less severe, you may take less than your prescribed dose if your health care provider approves. If you are not having pain, do nottake pain medicine unless your health care provider tells you to take it. If your pain is severe, do nottry to treat it yourself by taking more pills than instructed on your prescription. Contact your health care provider for help. Write down the times when you take your pain medicine. It is easy to become confused while on pain medicine. Writing the time can help you avoid overdose. Take other over-the-counter or prescription medicines only as told by your health care provider. Keeping yourself and others safe  While you are taking opioid pain medicine: Do not drive, use machinery, or power tools. Do not sign legal documents. Do not drink alcohol. Do not take sleeping pills. Do not supervise children by yourself. Do not do activities that require climbing or being in high places. Do not go to a lake, river, ocean, spa, or swimming pool. Do not share your pain medicine with anyone. Keep pain medicine in a locked cabinet or in a secure area where pets and children cannot reach it. Stopping your use of opioids If you have been taking opioid medicine for more than a few weeks, you may need to slowly decrease (taper) how much you take until you stop completely. Tapering your use of opioids can decrease your risk of symptoms of withdrawal, such as: Pain and cramping in the abdomen. Nausea. Sweating. Sleepiness. Restlessness. Uncontrollable shaking (tremors). Cravings for the  medicine. Do not attempt to taper your use of opioids on your own. Talk with your health care provider about how to do this. Your health care provider may prescribe a step-down schedule based on how much medicine you are taking and how long you have been taking it. Getting rid of leftover pills Do not save any leftover pills. Get rid of leftover pills safely by: Taking the medicine to a prescription take-back program. This is usually offered by the county or law enforcement. Bringing them to a pharmacy that has a drug disposal container. Flushing them down the toilet. Check the label or package insert of your medicine to see whether this is safe to do. Throwing them out in the trash. Check the label or package insert of your medicine to see whether this is safe to do. If it is safe to throw it out, remove the medicine from the original container, put it into a sealable bag or container, and mix it with used coffee grounds, food scraps, dirt, or cat litter before putting it in the trash. Follow these instructions at home: Activity Do exercises as told by your health care provider. Avoid activities that make your pain worse. Return to your normal activities as told by your health care provider. Ask your health  care provider what activities are safe for you. General instructions You may need to take these actions to prevent or treat constipation: Drink enough fluid to keep your urine pale yellow. Take over-the-counter or prescription medicines. Eat foods that are high in fiber, such as beans, whole grains, and fresh fruits and vegetables. Limit foods that are high in fat and processed sugars, such as fried or sweet foods. Keep all follow-up visits. This is important. Where to find support If you have been taking opioids for a long time, you may benefit from receiving support for quitting from a local support group or counselor. Ask your health care provider for a referral to these resources in  your area. Where to find more information Centers for Disease Control and Prevention (CDC): FootballExhibition.com.br [place] Food and Drug Administration (FDA): PumpkinSearch.com.ee Get help right away if: You may have taken too much of an opioid (overdosed). Common symptoms of an overdose: Your breathing is slower or more shallow than normal. You have a very slow heartbeat (pulse). You have slurred speech. You have nausea and vomiting. Your pupils become very small. You have other potential symptoms: You are very confused. You faint or feel like you will faint. You have cold, clammy skin. You have blue lips or fingernails. You have thoughts of harming yourself or harming others. These symptoms may represent a serious problem that is an emergency. Do not wait to see if the symptoms will go away. Get medical help right away. Call your local emergency services (911 in the [place] Do not drive yourself to the hospital.  If you ever feel like you may hurt yourself or others, or have thoughts about taking your own life, get help right away. Go to your nearest emergency department or: Call your local emergency services (911 in the [place] Call the [facility] ([phone] in the [place] Call a suicide crisis helpline, such as the National Suicide Prevention Lifeline at (505) 015-7421 or 988 in the [place] This is open 24 hours a day in the [place] If you're a Veteran: Call 988 and press 1. This is open 24 hours a day. Text the [employer] at (709)706-4785. Summary Opioid medicines can help you manage moderate to severe pain for a short period of time. A pain treatment plan is an agreement between you and your health care provider. Discuss the goals of your treatment, including how much pain you might expect to have and how you will manage the pain. If you think that you or someone else may have taken too much of an opioid, get medical help right away. This information is not intended to replace advice  given to you by your health care provider. Make sure you discuss any questions you have with your health care provider. Document Revised: 02/01/2023 Document Reviewed: 08/07/2020 Elsevier Patient Education  2024 Elsevier Inc.    02/02/2024    2:09 PM  Advanced Directives  Does Patient Have a Medical Advance Directive? Yes  Type of Estate agent of [place];Living will  Does patient want to make changes to medical advance directive? No - Patient declined  Copy of Healthcare Power of Attorney in Chart? Yes - validated most recent copy scanned in chart (See row information)   Advance Care Planning is important because it: Ensures you receive medical care that aligns with your values, goals, and preferences. Provides guidance to your family and loved ones, reducing the emotional burden of decision-making during critical moments.  Vision: Annual vision screenings  are recommended for early detection of glaucoma, cataracts, and diabetic retinopathy. These exams can also reveal signs of chronic conditions such as diabetes and high blood pressure.  Dental: Annual dental screenings help detect early signs of oral cancer, gum disease, and other conditions linked to overall health, including heart disease and diabetes.  Please see the attached documents for additional preventive care recommendations.

## 2024-02-03 MED ORDER — PREGABALIN 25 MG PO CAPS: 25.0000 mg | ORAL_CAPSULE | Freq: Every day | ORAL | 3 refills | Status: DC

## 2024-02-03 MED ORDER — LIDOCAINE 5 % EX OINT: TOPICAL_OINTMENT | Freq: Three times a day (TID) | CUTANEOUS | 4 refills | Status: DC | PRN

## 2024-02-03 MED ORDER — PREGABALIN 150 MG PO CAPS: 150.0000 mg | ORAL_CAPSULE | Freq: Two times a day (BID) | ORAL | 3 refills | Status: DC

## 2024-02-04 NOTE — Progress Notes (Addendum)
 Malayzia Laforte                                          MRN: [id number]   02/04/2024   The [facility] Quality Team Specialist reviewed this patient medical record for the purposes of chart review for care gap closure. The following were reviewed: abstraction for care gap closure-glycemic status assessment. Chart also reviewed for KED labs, need uACR.   05/08/2024- Abstracted GSD, KED closure confirmed in [facility] portal.   [facility] Quality Team

## 2024-02-09 ENCOUNTER — Encounter: Payer: Self-pay | Admitting: Family Medicine

## 2024-02-09 ENCOUNTER — Telehealth (INDEPENDENT_AMBULATORY_CARE_PROVIDER_SITE_OTHER): Admitting: Family Medicine

## 2024-02-09 DIAGNOSIS — G6289 Other specified polyneuropathies: Secondary | ICD-10-CM

## 2024-02-09 DIAGNOSIS — Z6841 Body Mass Index (BMI) 40.0 and over, adult: Secondary | ICD-10-CM

## 2024-02-09 DIAGNOSIS — Z87442 Personal history of urinary calculi: Secondary | ICD-10-CM

## 2024-02-09 DIAGNOSIS — G609 Hereditary and idiopathic neuropathy, unspecified: Secondary | ICD-10-CM | POA: Diagnosis not present

## 2024-02-09 DIAGNOSIS — I10 Essential (primary) hypertension: Secondary | ICD-10-CM | POA: Diagnosis not present

## 2024-02-09 DIAGNOSIS — E119 Type 2 diabetes mellitus without complications: Secondary | ICD-10-CM | POA: Diagnosis not present

## 2024-02-09 DIAGNOSIS — R5383 Other fatigue: Secondary | ICD-10-CM

## 2024-02-09 DIAGNOSIS — R600 Localized edema: Secondary | ICD-10-CM

## 2024-02-09 MED ORDER — PREGABALIN 25 MG PO CAPS: 25.0000 mg | ORAL_CAPSULE | Freq: Every day | ORAL | 3 refills | Status: AC

## 2024-02-09 MED ORDER — PREGABALIN 150 MG PO CAPS: 150.0000 mg | ORAL_CAPSULE | Freq: Two times a day (BID) | ORAL | 3 refills | Status: AC

## 2024-02-09 MED ORDER — LIDOCAINE-PRILOCAINE 2.5-2.5 % EX CREA: TOPICAL_CREAM | CUTANEOUS | 11 refills | Status: AC

## 2024-02-09 NOTE — Progress Notes (Signed)
 Virtual Visit via Video Note  I connected with Katherine [name] on 02/09/24 at  1:30 PM EDT by a video enabled telemedicine application and verified that I am speaking with the correct person using two identifiers.  Location patient: home Location provider:work or home office Persons participating in the virtual visit: patient, provider  I discussed the limitations of evaluation and management by telemedicine and the availability of in person appointments. The patient expressed understanding and agreed to proceed. Chief Complaint  Patient presents with   Medical Management of Chronic Issues    Medication follow-up      HPI:  Pt is a 73 yo female seen for f/u.    Had hematuria.  Seen by Urology for the last month due to renal calculi.  Pt was on several rounds of abx.  Having microscopic hematuria.  Told may chronically have some bacteria in urine.  Having sensation of urinary freq.  Plans to return to swimming most days.   Having back pain again like before she had surgery.  Back worse with movement but ok if sitting. Has not been taking pain pills as they increase drowsiness and she already takes lyrica  and nortriptyline .  Legs swelling more.  Has been doing more sitting.  Using the walker to help with ambulation.  R>LLE.  Worse when has to go out and is on her feet more.    Pt was having issues getting refills on lyrica  and emla  cream for neuropathy.  Refill was sent to local pharmacy but pt needs them sent to mail order pharmacy.  Wt has been fluctuating.  Trying to eat at least 2 meals per day.  Doesn't feel hungry in am.  Drinking ensure.  Not eating carbs or salt.   Has eye exam next month.  Had L cataract removed.    R hasn't been that bad.  ROS: See pertinent positives and negatives per HPI.  Past Medical History:  Diagnosis Date   Cataract    LEFT,REMOVED   Complication of anesthesia     neuropathy in feet flares after surgery from PAS hose. needs cream on feet   DDD  (degenerative disc disease), lumbar 2012   Dental crowns present    Diabetes mellitus without complication (HCC)    History of anemia 1991   not since hysterectomy   History of gross hematuria 2002   with kidney stones   History of hyperparathyroidism    History of kidney stones    Hyperlipidemia    Hypertension    states under control with meds., has been on med. x 20 yr.   Impingement syndrome of right shoulder 2017   Lymphedema of both lower extremities    due to neuropathy   Lymphedema of both upper extremities    due to neuropathy   Obese    Osteoarthritis    Peripheral neuropathy    hands and feet   Sciatica    Triangular fibrocartilage complex tear 06/2017   right   Ulnar impaction syndrome, right 06/2017   Wears partial dentures    upper and lower    Past Surgical History:  Procedure Laterality Date   ABDOMINAL HYSTERECTOMY  1991   partial   CARPAL TUNNEL RELEASE Right 08/09/2002   CARPAL TUNNEL RELEASE Left 11/03/2006   CATARACT EXTRACTION Left 07/21/2021   COLONOSCOPY  01/2016   COLONOSCOPY WITH PROPOFOL   11/24/2011   CYSTOSCOPY WITH RETROGRADE PYELOGRAM, URETEROSCOPY AND STENT PLACEMENT Left 12/18/2014   Procedure: CYSTOSCOPY WITH RETROGRADE PYELOGRAM, URETEROSCOPY AND  STENT PLACEMENT left ureter;  Surgeon: Gretel Ferrara, MD;  Location: [facility];  Service: Urology;  Laterality: Left;   CYSTOSCOPY/URETEROSCOPY/HOLMIUM LASER/STENT PLACEMENT Left 07/08/2023   Procedure: CYSTOSCOPY/ LEFT URETEROSCOPY/HOLMIUM LASER/STENT PLACEMENT, LEFT RETROGRADE PYELOGRAM;  Surgeon: Ferrara Gretel, MD;  Location: [facility];  Service: Urology;  Laterality: Left;  60 MINUTE CASE   DILATION AND CURETTAGE OF UTERUS     ELBOW ARTHROSCOPY Right    EXTRACORPOREAL SHOCK WAVE LITHOTRIPSY Left 03/08/2017   Procedure: LEFT EXTRACORPOREAL SHOCK WAVE LITHOTRIPSY (ESWL);  Surgeon: Ferrara Gretel, MD;  Location: [facility];  Service: Urology;  Laterality: Left;   HOLMIUM LASER APPLICATION Left 12/18/2014    Procedure: HOLMIUM LASER APPLICATION left ureter ;  Surgeon: Gretel Ferrara, MD;  Location: [facility];  Service: Urology;  Laterality: Left;   LUMBAR LAMINECTOMY/DECOMPRESSION MICRODISCECTOMY N/A 11/06/2022   Procedure: OPEN LUMBAR LAMINECTOMY, Lumbar two-three;  Surgeon: Dawley, Lani BROCKS, DO;  Location: [facility];  Service: Neurosurgery;  Laterality: N/A;   PARATHYROIDECTOMY Right 05/03/2015   Procedure: RIGHT SUPERIOR PARATHYROIDECTOMY;  Surgeon: Krystal Spinner, MD;  Location: [facility];  Service: General;  Laterality: Right;   PATELLA REALIGNMENT Bilateral    as a teenager   ROTATOR CUFF REPAIR Right    x2   SHOULDER ARTHROSCOPY Right    x 2   SHOULDER ARTHROSCOPY W/ ROTATOR CUFF REPAIR Left 04/26/2002   SYNOVECTOMY WITH POLY EXCHANGE Left 11/28/2018   Procedure: SYNOVECTOMY WITH ERLINE REASONER;  Surgeon: Rubie Kemps, MD;  Location: [facility];  Service: Orthopedics;  Laterality: Left;   TONSILLECTOMY     TOTAL KNEE ARTHROPLASTY Left 09/10/2003   TOTAL KNEE ARTHROPLASTY Right 11/10/2004   TOTAL KNEE ARTHROPLASTY Right 05/02/2018   Procedure: Right polyethelene exchange and open synovectomy;  Surgeon: Rubie Kemps, MD;  Location: [facility];  Service: Orthopedics;  Laterality: Right;   TRIGGER FINGER RELEASE Right 08/06/2008   ring finger   TRIGGER FINGER RELEASE     multiple - right thumb, left index/long/ring/thumb   ULNA OSTEOTOMY Right 07/28/2017   Procedure: REVISION RIGHT ULNAR SHORTENING OSTEOTOMY;  Surgeon: Jerri Kay HERO, MD;  Location: [facility];  Service: Orthopedics;  Laterality: Right;   WRIST ARTHROSCOPY Left 06/16/2000   WRIST ARTHROSCOPY WITH ULNA SHORTENING Right 07/07/2017   Procedure: RIGHT WRIST ARTHROSCOPY WITH ULNAR SHORTENING OSTEOTOMY;  Surgeon: Jerri Kay HERO, MD;  Location: [facility];  Service: Orthopedics;  Laterality: Right;    Family History  Problem Relation Age of Onset   Stroke Mother    Cancer Mother 45       bone cancer   Other Father         unknown medical history   Diabetes Sister    Colon cancer Brother    Liver cancer Brother    Colon polyps Child    Crohn's disease Neg Hx    Esophageal cancer Neg Hx    Rectal cancer Neg Hx    Stomach cancer Neg Hx    Ulcerative colitis Neg Hx      Current Outpatient Medications:    Cholecalciferol (VITAMIN D ) 50 MCG (2000 UT) tablet, Take 2,000 Units by mouth in the morning., Disp: , Rfl:    cycloSPORINE  (RESTASIS ) 0.05 % ophthalmic emulsion, Place 1 drop into both eyes 2 (two) times daily., Disp: , Rfl:    fexofenadine  (ALLEGRA  ALLERGY) 60 MG tablet, Take 1 tablet (60 mg total) by mouth daily. (Patient taking differently: Take 60 mg by mouth daily as needed for allergies.), Disp: 30  tablet, Rfl: 1   fluticasone  (FLONASE ) 50 MCG/ACT nasal spray, Place 1 spray into both nostrils daily. (Patient taking differently: Place 1 spray into both nostrils daily as needed for allergies.), Disp: 15.8 mL, Rfl: 2   hydrochlorothiazide  (HYDRODIURIL ) 12.5 MG tablet, Take 12.5 mg by mouth daily., Disp: , Rfl:    lidocaine  (XYLOCAINE ) 5 % ointment, Apply topically 3 (three) times daily as needed., Disp: 425.28 g, Rfl: 4   lidocaine -prilocaine  (EMLA ) cream, Apply 1 application topically to affected areas 3 times daily as needed., Disp: 360 g, Rfl: 11   lisinopril  (ZESTRIL ) 10 MG tablet, TAKE 1 TABLET BY MOUTH DAILY, Disp: 90 tablet, Rfl: 3   Multiple Vitamin (MULTIVITAMIN WITH MINERALS) TABS tablet, Take 1 tablet by mouth in the morning., Disp: , Rfl:    nortriptyline  (PAMELOR ) 25 MG capsule, TAKE 3 CAPSULES BY MOUTH AT  BEDTIME, Disp: 270 capsule, Rfl: 3   ondansetron  (ZOFRAN ) 4 MG tablet, Take 4 mg by mouth every 8 (eight) hours as needed for nausea or vomiting., Disp: , Rfl:    oxyCODONE -acetaminophen  (PERCOCET/ROXICET) 5-325 MG tablet, Take 1 tablet by mouth every 6 (six) hours as needed for severe pain (pain score 7-10)., Disp: , Rfl:    pregabalin  (LYRICA ) 150 MG capsule, Take 1 capsule (150 mg  total) by mouth 2 (two) times daily., Disp: 60 capsule, Rfl: 3   pregabalin  (LYRICA ) 25 MG capsule, Take 1 capsule (25 mg total) by mouth at bedtime. To be taken with 150 mg cap for a total dose of 175 mg daily., Disp: 30 capsule, Rfl: 3   traMADol  (ULTRAM ) 50 MG tablet, Take 50 mg by mouth every 6 (six) hours as needed for severe pain (pain score 7-10)., Disp: , Rfl:   EXAM:  VITALS per patient if applicable:  RR between 12-20 bpm   GENERAL: alert, oriented, appears well and in no acute distress  HEENT: atraumatic, conjunctiva clear, no obvious abnormalities on inspection of external nose and ears  NECK: normal movements of the head and neck  LUNGS: on inspection no signs of respiratory distress, breathing rate appears normal, no obvious gross SOB, gasping or wheezing  CV: no obvious cyanosis  MS: moves all visible extremities without noticeable abnormality  PSYCH/NEURO: pleasant and cooperative, no obvious depression or anxiety, speech and thought processing grossly intact  ASSESSMENT AND PLAN:  Discussed the following assessment and plan:  Hereditary and idiopathic peripheral neuropathy - Plan: pregabalin  (LYRICA ) 150 MG capsule, pregabalin  (LYRICA ) 25 MG capsule  Essential hypertension  Other polyneuropathy - Plan: lidocaine -prilocaine  (EMLA ) cream  History of renal calculi  Fatigue, unspecified type  Diet-controlled diabetes mellitus (HCC)  Bilateral lower extremity edema   Hereditary and idiopathic peripheral neuropathy stable.  Limited add additional options for pain control.  Pregabalin  150 mg in a.m. and 175 mg in p.m. and Emla  cream refilled.  Sent to mail order pharmacy. Continue supportive care for b/l lower extremity edema.  Continue current meds.  Hydrochlorothiazide  may be contributing to renal calculi.  Continue f/u with Urology.  DM well-controlled.  Continue diet changes.  Fatigue possibly related to deconditioning.  Also consider vit D def.  Follow-up  in the next few months for CPE and labs.    I discussed the assessment and treatment plan with the patient. The patient was provided an opportunity to ask questions and all were answered. The patient agreed with the plan and demonstrated an understanding of the instructions.   The patient was advised to call back or seek  an in-person evaluation if the symptoms worsen or if the condition fails to improve as anticipated.   Clotilda JONELLE Single, MD

## 2024-02-09 NOTE — Telephone Encounter (Signed)
 Rx previously sent to local pharmacy.  New rx sent to mail order pharmacy.  Pt aware.

## 2024-02-09 NOTE — Progress Notes (Signed)
 Patient was unable to self-report due to a lack of equipment at home via telehealth

## 2024-02-15 ENCOUNTER — Telehealth: Payer: Self-pay

## 2024-02-15 NOTE — Telephone Encounter (Unsigned)
 Copied from CRM #[id number]. Topic: Clinical - Prescription Issue >> Feb 15, 2024 11:23 AM Deaijah H wrote: Reason for CRM: Patient stated pharmacy received lidocaine -prilocaine  (EMLA ) cream and not the lidocaine  (XYLOCAINE ) 5 % ointment. Please resend the ointment to pharmacy.

## 2024-02-16 MED ORDER — LIDOCAINE 5 % EX OINT: TOPICAL_OINTMENT | Freq: Three times a day (TID) | CUTANEOUS | 4 refills | Status: AC | PRN

## 2024-02-16 NOTE — Addendum Note (Signed)
 Addended by: BRIEN SONG A on: 02/16/2024 02:12 PM   Modules accepted: Orders

## 2024-02-24 ENCOUNTER — Other Ambulatory Visit: Payer: Self-pay | Admitting: Family Medicine

## 2024-02-28 ENCOUNTER — Other Ambulatory Visit (HOSPITAL_BASED_OUTPATIENT_CLINIC_OR_DEPARTMENT_OTHER): Payer: Self-pay

## 2024-02-28 ENCOUNTER — Encounter: Payer: Self-pay | Admitting: Family Medicine

## 2024-02-28 ENCOUNTER — Ambulatory Visit: Admitting: Family Medicine

## 2024-02-28 VITALS — BP 100/72 | HR 110 | Temp 98.3°F | Ht 65.45 in | Wt 255.4 lb

## 2024-02-28 DIAGNOSIS — Z0000 Encounter for general adult medical examination without abnormal findings: Secondary | ICD-10-CM | POA: Diagnosis not present

## 2024-02-28 DIAGNOSIS — E119 Type 2 diabetes mellitus without complications: Secondary | ICD-10-CM | POA: Diagnosis not present

## 2024-02-28 DIAGNOSIS — E782 Mixed hyperlipidemia: Secondary | ICD-10-CM | POA: Diagnosis not present

## 2024-02-28 DIAGNOSIS — I10 Essential (primary) hypertension: Secondary | ICD-10-CM

## 2024-02-28 DIAGNOSIS — G609 Hereditary and idiopathic neuropathy, unspecified: Secondary | ICD-10-CM

## 2024-02-28 LAB — MICROALBUMIN / CREATININE URINE RATIO
Creatinine,U: 113.4 mg/dL
Microalb Creat Ratio: 73.5 mg/g — ABNORMAL HIGH (ref 0.0–30.0)
Microalb, Ur: 8.3 mg/dL — ABNORMAL HIGH (ref 0.0–1.9)

## 2024-02-28 MED ORDER — COMIRNATY 30 MCG/0.3ML IM SUSY
0.3000 mL | PREFILLED_SYRINGE | Freq: Once | INTRAMUSCULAR | 0 refills | Status: AC
  Filled 2024-02-28: qty 0.3, 1d supply, fill #0

## 2024-02-28 NOTE — Progress Notes (Signed)
 Established Patient Office Visit   Subjective  Patient ID: Katherine [name], female    DOB: February 08, 1951  Age: 73 y.o. MRN: [id number]  Chief Complaint  Patient presents with   Annual Exam    Pt is a 73 yo female seen for CPE.  Pt doing ok.  Having edema in b/l Les, R>L.  Edema ok if laying down, but starts when has to be up moving.  Wearing compression socks, but at times become too tight and dig into skin.  Neuropathy in RLE now up to above ankle.  Using emla  cream.  Finally able to get meds from mail order pharmacy.  Using rollator to aid with ambulation.  Tries to walk without it at home, but has to use it.  Can lean on shopping cart when at grocery store.  Back pain ok with sitting but starts when up moving around.  Does not want another back injection, surgery, or nerve conduction study test.  Pt has an appt for eye exam in Nov.  Has been waiting on podiatry to schedule appt for fitting for diabetic shoes.  States the person that does the fittings was not in the office at time of her last visit.    Patient Active Problem List   Diagnosis Date Noted   Lumbar spinal stenosis 11/06/2022   Diet-controlled diabetes mellitus (HCC) 03/11/2022   Mixed hyperlipidemia 03/11/2022   Cataract of left eye 03/11/2022   Spinal stenosis, lumbar region with neurogenic claudication 02/05/2021   Prediabetes 11/06/2019   Peripheral neuropathy 02/09/2019   Chronic right-sided low back pain with right-sided sciatica 02/09/2019   S/P revision of total knee 11/28/2018   S/P total knee replacement 05/02/2018   Body mass index 40.0-44.9, adult (HCC) 10/22/2017   Closed displaced oblique fracture of shaft of ulna with malunion, subsequent encounter 07/28/2017   Ulnar impaction syndrome, right 07/07/2017   Arthritis of right wrist 07/07/2017   Pain in right wrist 06/01/2017   Trigger finger, right index finger 09/28/2016   Impaired glucose tolerance 09/09/2015   Hyperparathyroidism (HCC) 05/26/2013    Hereditary and idiopathic peripheral neuropathy 12/29/2012   Morbid obesity (HCC)    Menopausal symptoms 11/10/2011   DIVERTICULOSIS, COLON 11/02/2007   Dyslipidemia 03/09/2006   Essential hypertension 03/09/2006   NEPHROLITHIASIS, HX OF 03/09/2006   Past Medical History:  Diagnosis Date   Allergy    Cataract    LEFT,REMOVED   Complication of anesthesia     neuropathy in feet flares after surgery from PAS hose. needs cream on feet   DDD (degenerative disc disease), lumbar 2012   Dental crowns present    Diabetes mellitus without complication (HCC)    GERD (gastroesophageal reflux disease)    History of anemia 1991   not since hysterectomy   History of gross hematuria 2002   with kidney stones   History of hyperparathyroidism    History of kidney stones    Hyperlipidemia    Hypertension    states under control with meds., has been on med. x 20 yr.   Impingement syndrome of right shoulder 2017   Lymphedema of both lower extremities    due to neuropathy   Lymphedema of both upper extremities    due to neuropathy   Obese    Osteoarthritis    Peripheral neuropathy    hands and feet   Sciatica    Triangular fibrocartilage complex tear 06/2017   right   Ulnar impaction syndrome, right 06/2017   Wears partial dentures  upper and lower   Past Surgical History:  Procedure Laterality Date   ABDOMINAL HYSTERECTOMY  1991   partial   CARPAL TUNNEL RELEASE Right 08/09/2002   CARPAL TUNNEL RELEASE Left 11/03/2006   CATARACT EXTRACTION Left 07/21/2021   COLONOSCOPY  01/2016   COLONOSCOPY WITH PROPOFOL   11/24/2011   CYSTOSCOPY WITH RETROGRADE PYELOGRAM, URETEROSCOPY AND STENT PLACEMENT Left 12/18/2014   Procedure: CYSTOSCOPY WITH RETROGRADE PYELOGRAM, URETEROSCOPY AND STENT PLACEMENT left ureter;  Surgeon: Gretel Ferrara, MD;  Location: [facility];  Service: Urology;  Laterality: Left;   CYSTOSCOPY/URETEROSCOPY/HOLMIUM LASER/STENT PLACEMENT Left 07/08/2023   Procedure: CYSTOSCOPY/  LEFT URETEROSCOPY/HOLMIUM LASER/STENT PLACEMENT, LEFT RETROGRADE PYELOGRAM;  Surgeon: Ferrara Gretel, MD;  [facility]: [facility];  Service: Urology;  Laterality: Left;  60 MINUTE CASE   DILATION AND CURETTAGE OF UTERUS     ELBOW ARTHROSCOPY Right    EXTRACORPOREAL SHOCK WAVE LITHOTRIPSY Left 03/08/2017   Procedure: LEFT EXTRACORPOREAL SHOCK WAVE LITHOTRIPSY (ESWL);  Surgeon: Ferrara Gretel, MD;  [facility];  Service: Urology;  Laterality: Left;   EYE SURGERY     HOLMIUM LASER APPLICATION Left 12/18/2014   Procedure: HOLMIUM LASER APPLICATION left ureter ;  Surgeon: Gretel Ferrara, MD;  [facility];  Service: Urology;  Laterality: Left;   JOINT REPLACEMENT  2005 &2006   LUMBAR LAMINECTOMY/DECOMPRESSION MICRODISCECTOMY N/A 11/06/2022   Procedure: OPEN LUMBAR LAMINECTOMY, Lumbar two-three;  Surgeon: Dawley, Lani BROCKS, DO;  [facility]: [facility];  Service: Neurosurgery;  Laterality: N/A;   PARATHYROIDECTOMY Right 05/03/2015   Procedure: RIGHT SUPERIOR PARATHYROIDECTOMY;  Surgeon: Krystal Spinner, MD;  Location: [facility];  Service: General;  Laterality: Right;   PATELLA REALIGNMENT Bilateral    as a teenager   ROTATOR CUFF REPAIR Right    x2   SHOULDER ARTHROSCOPY Right    x 2   SHOULDER ARTHROSCOPY W/ ROTATOR CUFF REPAIR Left 04/26/2002   SYNOVECTOMY WITH POLY EXCHANGE Left 11/28/2018   Procedure: SYNOVECTOMY WITH ERLINE REASONER;  Surgeon: Rubie Kemps, MD;  [facility];  Service: Orthopedics;  Laterality: Left;   TONSILLECTOMY     TOTAL KNEE ARTHROPLASTY Left 09/10/2003   TOTAL KNEE ARTHROPLASTY Right 11/10/2004   TOTAL KNEE ARTHROPLASTY Right 05/02/2018   Procedure: Right polyethelene exchange and open synovectomy;  Surgeon: Rubie Kemps, MD;  [facility];  Service: Orthopedics;  Laterality: Right;   TRIGGER FINGER RELEASE Right 08/06/2008   ring finger   TRIGGER FINGER RELEASE     multiple - right thumb, left index/long/ring/thumb   ULNA OSTEOTOMY Right 07/28/2017   Procedure: REVISION  RIGHT ULNAR SHORTENING OSTEOTOMY;  Surgeon: Jerri Kay HERO, MD;  Location: [facility];  Service: Orthopedics;  Laterality: Right;   WRIST ARTHROSCOPY Left 06/16/2000   WRIST ARTHROSCOPY WITH ULNA SHORTENING Right 07/07/2017   Procedure: RIGHT WRIST ARTHROSCOPY WITH ULNAR SHORTENING OSTEOTOMY;  Surgeon: Jerri Kay HERO, MD;  Location: [facility];  Service: Orthopedics;  Laterality: Right;   Social History   Tobacco Use   Smoking status: Never   Smokeless tobacco: Never  Vaping Use   Vaping status: Never Used  Substance Use Topics   Alcohol use: Never   Drug use: Never   Family History  Problem Relation Age of Onset   Stroke Mother    Cancer Mother 72       bone cancer   Other Father        unknown medical history   Diabetes Sister    COPD Sister    Colon cancer Brother  Liver cancer Brother    Colon polyps Child    Stroke Brother    Crohn's disease Neg Hx    Esophageal cancer Neg Hx    Rectal cancer Neg Hx    Stomach cancer Neg Hx    Ulcerative colitis Neg Hx    Allergies  Allergen Reactions   Erythromycin Other (See Comments)    CHEST PAIN   Paxil [Paroxetine] Other (See Comments)    Repeated falls    ROS Negative unless stated above    Objective:     BP 100/72 (BP Location: Left Arm, Patient Position: Sitting, Cuff Size: Large)   Pulse (!) 110   Temp 98.3 F (36.8 C) (Oral)   Ht 5' 5.45 (1.662 m)   Wt 255 lb 6.4 oz (115.8 kg)   SpO2 97%   BMI 41.92 kg/m  BP Readings from Last 3 Encounters:  02/28/24 100/72  02/02/24 122/62  07/23/23 112/70   Wt Readings from Last 3 Encounters:  02/28/24 255 lb 6.4 oz (115.8 kg)  02/02/24 251 lb 9.6 oz (114.1 kg)  07/23/23 243 lb 3.2 oz (110.3 kg)      Physical Exam Constitutional:      Appearance: Normal appearance.  HENT:     Head: Normocephalic and atraumatic.     Right Ear: Tympanic membrane, ear canal and external ear normal.     Left Ear: Tympanic membrane, ear canal and  external ear normal.     Nose: Nose normal.     Mouth/Throat:     Mouth: Mucous membranes are moist.     Pharynx: No oropharyngeal exudate or posterior oropharyngeal erythema.  Eyes:     General: No scleral icterus.    Extraocular Movements: Extraocular movements intact.     Conjunctiva/sclera: Conjunctivae normal.     Pupils: Pupils are equal, round, and reactive to light.  Neck:     Thyroid : No thyromegaly.     Vascular: No carotid bruit.  Cardiovascular:     Rate and Rhythm: Normal rate and regular rhythm.     Pulses: Normal pulses.     Heart sounds: Normal heart sounds. No murmur heard.    No friction rub.     Comments: B/l LE edema R>L, trace in LLE. Pulmonary:     Effort: Pulmonary effort is normal.     Breath sounds: Normal breath sounds. No wheezing, rhonchi or rales.  Abdominal:     General: Bowel sounds are normal.     Palpations: Abdomen is soft.     Tenderness: There is no abdominal tenderness.  Musculoskeletal:        General: No deformity. Normal range of motion.     Right lower leg: 1+ Pitting Edema present.     Left lower leg: Pitting Edema present.  Lymphadenopathy:     Cervical: No cervical adenopathy.  Skin:    General: Skin is warm and dry.     Findings: No lesion.  Neurological:     General: No focal deficit present.     Mental Status: She is alert and oriented to person, place, and time.  Psychiatric:        Mood and Affect: Mood normal.        Thought Content: Thought content normal.     Diabetic Foot Exam - Simple   Simple Foot Form Diabetic Foot exam was performed with the following findings: Yes 02/28/2024  9:40 AM  Visual Inspection No deformities, no ulcerations, no other skin breakdown bilaterally: Yes Sensation  Testing See comments: Yes Pulse Check See comments: Yes Comments Decreased DP and PT pulses b/l.  Sensation to monofilament and vibratory sense intact on dorsum of b/l feet at MTP jt to mid foot, otherwise absent.          02/09/2024    1:22 PM 02/02/2024    2:10 PM 07/23/2023   11:16 AM  Depression screen PHQ 2/9  Decreased Interest 1 0 2  Down, Depressed, Hopeless 0 0 2  PHQ - 2 Score 1 0 4  Altered sleeping 0  2  Tired, decreased energy 3  3  Change in appetite 0  3  Feeling bad or failure about yourself  0  0  Trouble concentrating 0  0  Moving slowly or fidgety/restless 0  0  Suicidal thoughts 0  0  PHQ-9 Score 4  12  Difficult doing work/chores   Somewhat difficult      02/09/2024    1:24 PM 07/23/2023   11:16 AM 04/26/2023    3:17 PM 09/14/2022    9:05 AM  GAD 7 : Generalized Anxiety Score  Nervous, Anxious, on Edge 0 2 0   Control/stop worrying 0 0 0 0  Worry too much - different things 0 0 0 0  Trouble relaxing 0 0 0 1  Restless 0 0 0 0  Easily annoyed or irritable 0 2 0 0  Afraid - awful might happen 0 0 0 0  Total GAD 7 Score 0 4 0   Anxiety Difficulty  Not difficult at all Not difficult at all Not difficult at all     No results found for any visits on 02/28/24.    Assessment & Plan:   Well adult exam -     CBC with Differential/Platelet; Future -     Comprehensive metabolic panel with GFR; Future -     Hemoglobin A1c; Future -     Lipid panel; Future -     T4, free; Future -     TSH; Future  Essential hypertension -     Comprehensive metabolic panel with GFR; Future -     T4, free; Future -     TSH; Future  Diet-controlled diabetes mellitus (HCC) -     Hemoglobin A1c; Future -     Microalbumin / creatinine urine ratio  Mixed hyperlipidemia -     Lipid panel; Future  Morbid obesity (HCC) -     VITAMIN D  25 Hydroxy (Vit-D Deficiency, Fractures); Future  Hereditary and idiopathic peripheral neuropathy -     Vitamin B12; Future  Age appropriate health screenings discussed.  Obtain labs.  Immunizations reviewed.  Mammogram done 01/12/24.  Colonoscopy 06/24/22.  Bp controlled.  Continue lisinopril  10 mg, hydrochlorothiazide  12.5 mg.  DM II diet controlled, last A1C 6.6%  on 07/06/23.  Foot exam done this visit.  Eye exam schedule for Nov.  Pt waiting on Podiatry for DM shoes.  Continue meds for neuropathy including pamelor , lyrica , emla  cream.  Return in about 1 year (around 02/27/2025), or if symptoms worsen or fail to improve, for physical.   Clotilda JONELLE Single, MD

## 2024-02-29 LAB — LIPID PANEL
Cholesterol: 191 mg/dL (ref <200)
HDL: 45 mg/dL — ABNORMAL LOW (ref >=50)
LDL Cholesterol (Calc): 107 mg/dL — ABNORMAL HIGH
Non-HDL Cholesterol (Calc): 146 mg/dL — ABNORMAL HIGH (ref <130)
Total CHOL/HDL Ratio: 4.2 (calc) (ref <5.0)
Triglycerides: 294 mg/dL — ABNORMAL HIGH (ref <150)

## 2024-02-29 LAB — CBC WITH DIFFERENTIAL/PLATELET
Absolute Lymphocytes: 1804 {cells}/uL (ref 850–3900)
Absolute Monocytes: 490 {cells}/uL (ref 200–950)
Basophils Absolute: 29 {cells}/uL (ref 0–200)
Basophils Relative: 0.8 %
Eosinophils Absolute: 112 {cells}/uL (ref 15–500)
Eosinophils Relative: 3.1 %
HCT: 40.9 % (ref 35.0–45.0)
Hemoglobin: 13.8 g/dL (ref 11.7–15.5)
MCH: 30.1 pg (ref 27.0–33.0)
MCHC: 33.7 g/dL (ref 32.0–36.0)
MCV: 89.3 fL (ref 80.0–100.0)
MPV: 11.6 fL (ref 7.5–12.5)
Monocytes Relative: 13.6 %
Neutro Abs: 1166 {cells}/uL — ABNORMAL LOW (ref 1500–7800)
Neutrophils Relative %: 32.4 %
Platelets: 141 Thousand/uL (ref 140–400)
RBC: 4.58 Million/uL (ref 3.80–5.10)
RDW: 13.7 % (ref 11.0–15.0)
Total Lymphocyte: 50.1 %
WBC: 3.6 Thousand/uL — ABNORMAL LOW (ref 3.8–10.8)

## 2024-02-29 LAB — COMPREHENSIVE METABOLIC PANEL WITH GFR
AG Ratio: 1.4 (calc) (ref 1.0–2.5)
ALT: 20 U/L (ref 6–29)
AST: 42 U/L — ABNORMAL HIGH (ref 10–35)
Albumin: 4.1 g/dL (ref 3.6–5.1)
Alkaline phosphatase (APISO): 62 U/L (ref 37–153)
BUN: 11 mg/dL (ref 7–25)
CO2: 29 mmol/L (ref 20–32)
Calcium: 10.1 mg/dL (ref 8.6–10.4)
Chloride: 101 mmol/L (ref 98–110)
Creat: 0.85 mg/dL (ref 0.60–1.00)
Globulin: 2.9 g/dL (ref 1.9–3.7)
Glucose, Bld: 125 mg/dL — ABNORMAL HIGH (ref 65–99)
Potassium: 4.1 mmol/L (ref 3.5–5.3)
Sodium: 137 mmol/L (ref 135–146)
Total Bilirubin: 0.7 mg/dL (ref 0.2–1.2)
Total Protein: 7 g/dL (ref 6.1–8.1)
eGFR: 72 mL/min/1.73m2 (ref >=60)

## 2024-02-29 LAB — VITAMIN D 25 HYDROXY (VIT D DEFICIENCY, FRACTURES): Vit D, 25-Hydroxy: 58 ng/mL (ref 30–100)

## 2024-02-29 LAB — VITAMIN B12: Vitamin B-12: 863 pg/mL (ref 200–1100)

## 2024-02-29 LAB — HEMOGLOBIN A1C
Hgb A1c MFr Bld: 6.8 % — ABNORMAL HIGH (ref <5.7)
Mean Plasma Glucose: 148 mg/dL
eAG (mmol/L): 8.2 mmol/L

## 2024-02-29 LAB — T4, FREE: Free T4: 0.9 ng/dL (ref 0.8–1.8)

## 2024-02-29 LAB — TSH: TSH: 1.08 m[IU]/L (ref 0.40–4.50)

## 2024-03-06 ENCOUNTER — Other Ambulatory Visit (HOSPITAL_BASED_OUTPATIENT_CLINIC_OR_DEPARTMENT_OTHER): Payer: Self-pay

## 2024-03-06 MED ORDER — SHINGRIX 50 MCG/0.5ML IM SUSR
0.5000 mL | Freq: Once | INTRAMUSCULAR | 0 refills | Status: AC
  Filled 2024-03-06: qty 0.5, 1d supply, fill #0

## 2024-03-11 ENCOUNTER — Other Ambulatory Visit: Payer: Self-pay | Admitting: Family Medicine

## 2024-03-16 ENCOUNTER — Ambulatory Visit: Payer: Self-pay | Admitting: Family Medicine

## 2024-04-11 ENCOUNTER — Ambulatory Visit: Admitting: Orthopaedic Surgery

## 2024-04-11 DIAGNOSIS — G8929 Other chronic pain: Secondary | ICD-10-CM | POA: Diagnosis not present

## 2024-04-11 DIAGNOSIS — M25511 Pain in right shoulder: Secondary | ICD-10-CM

## 2024-04-11 NOTE — Progress Notes (Signed)
 Office Visit Note   Patient: Katherine [name]           Date of Birth: 1950/07/28           MRN: [id number] Visit Date: 04/11/2024              Requested by: Mercer Clotilda SAUNDERS, MD [address] [place],  [place] [address] PCP: Mercer Clotilda SAUNDERS, MD   Assessment & Plan: Visit Diagnoses:  1. Chronic right shoulder pain   2. Pain in right acromioclavicular joint     Plan: History of Present Illness Katherine [name] is a 73 year old female who presents with recurrent right shoulder pain.  Since summer 2025, she has recurrent right shoulder pain after significant relief from an [facility] joint injection in February 2024. The pain radiates and is not relieved by positional changes, and activities such as swimming and reaching worsen it.  Examination of the right shoulder is consistent with pain around the [facility] joint and positive cross body adduction sign.  Assessment and Plan Right shoulder acromioclavicular joint pain Chronic pain with previous relief from injection, exacerbated by swimming and reaching. - Consulted Dr. Burnetta for another injection.  Follow-Up Instructions: No follow-ups on file.   Orders:  No orders of the defined types were placed in this encounter.  No orders of the defined types were placed in this encounter.     Procedures: No procedures performed   Clinical Data: No additional findings.   Subjective: Chief Complaint  Patient presents with   Right Shoulder - Pain    HPI  Review of Systems  Constitutional: Negative.   HENT: Negative.    Eyes: Negative.   Respiratory: Negative.    Cardiovascular: Negative.   Endocrine: Negative.   Musculoskeletal: Negative.   Neurological: Negative.   Hematological: Negative.   Psychiatric/Behavioral: Negative.    All other systems reviewed and are negative.    Objective: Vital Signs: There were no vitals taken for this visit.  Physical Exam Vitals and nursing note reviewed.  Constitutional:       Appearance: She is well-developed.  HENT:     Head: Atraumatic.     Nose: Nose normal.  Eyes:     Extraocular Movements: Extraocular movements intact.  Cardiovascular:     Pulses: Normal pulses.  Pulmonary:     Effort: Pulmonary effort is normal.  Abdominal:     Palpations: Abdomen is soft.  Musculoskeletal:     Cervical back: Neck supple.  Skin:    General: Skin is warm.     Capillary Refill: Capillary refill takes less than 2 seconds.  Neurological:     Mental Status: She is alert. Mental status is at baseline.  Psychiatric:        Behavior: Behavior normal.        Thought Content: Thought content normal.        Judgment: Judgment normal.     Ortho Exam  Specialty Comments:  No specialty comments available.  Imaging: No results found.   PMFS History: Patient Active Problem List   Diagnosis Date Noted   Lumbar spinal stenosis 11/06/2022   Diet-controlled diabetes mellitus (HCC) 03/11/2022   Mixed hyperlipidemia 03/11/2022   Cataract of left eye 03/11/2022   Spinal stenosis, lumbar region with neurogenic claudication 02/05/2021   Prediabetes 11/06/2019   Peripheral neuropathy 02/09/2019   Chronic right-sided low back pain with right-sided sciatica 02/09/2019   S/P revision of total knee 11/28/2018   S/P total knee replacement 05/02/2018  Body mass index 40.0-44.9, adult (HCC) 10/22/2017   Closed displaced oblique fracture of shaft of ulna with malunion, subsequent encounter 07/28/2017   Ulnar impaction syndrome, right 07/07/2017   Arthritis of right wrist 07/07/2017   Pain in right wrist 06/01/2017   Trigger finger, right index finger 09/28/2016   Impaired glucose tolerance 09/09/2015   Hyperparathyroidism (HCC) 05/26/2013   Hereditary and idiopathic peripheral neuropathy 12/29/2012   Morbid obesity (HCC)    Menopausal symptoms 11/10/2011   DIVERTICULOSIS, COLON 11/02/2007   Dyslipidemia 03/09/2006   Essential hypertension 03/09/2006   NEPHROLITHIASIS,  HX OF 03/09/2006   Past Medical History:  Diagnosis Date   Allergy    Cataract    LEFT,REMOVED   Complication of anesthesia     neuropathy in feet flares after surgery from PAS hose. needs cream on feet   DDD (degenerative disc disease), lumbar 2012   Dental crowns present    Diabetes mellitus without complication (HCC)    GERD (gastroesophageal reflux disease)    History of anemia 1991   not since hysterectomy   History of gross hematuria 2002   with kidney stones   History of hyperparathyroidism    History of kidney stones    Hyperlipidemia    Hypertension    states under control with meds., has been on med. x 20 yr.   Impingement syndrome of right shoulder 2017   Lymphedema of both lower extremities    due to neuropathy   Lymphedema of both upper extremities    due to neuropathy   Obese    Osteoarthritis    Peripheral neuropathy    hands and feet   Sciatica    Triangular fibrocartilage complex tear 06/2017   right   Ulnar impaction syndrome, right 06/2017   Wears partial dentures    upper and lower    Family History  Problem Relation Age of Onset   Stroke Mother    Cancer Mother 27       bone cancer   Other Father        unknown medical history   Diabetes Sister    COPD Sister    Colon cancer Brother    Liver cancer Brother    Colon polyps Child    Stroke Brother    Crohn's disease Neg Hx    Esophageal cancer Neg Hx    Rectal cancer Neg Hx    Stomach cancer Neg Hx    Ulcerative colitis Neg Hx     Past Surgical History:  Procedure Laterality Date   ABDOMINAL HYSTERECTOMY  1991   partial   CARPAL TUNNEL RELEASE Right 08/09/2002   CARPAL TUNNEL RELEASE Left 11/03/2006   CATARACT EXTRACTION Left 07/21/2021   COLONOSCOPY  01/2016   COLONOSCOPY WITH PROPOFOL   11/24/2011   CYSTOSCOPY WITH RETROGRADE PYELOGRAM, URETEROSCOPY AND STENT PLACEMENT Left 12/18/2014   Procedure: CYSTOSCOPY WITH RETROGRADE PYELOGRAM, URETEROSCOPY AND STENT PLACEMENT left ureter;   Surgeon: Gretel Ferrara, MD;  [facility];  Service: Urology;  Laterality: Left;   CYSTOSCOPY/URETEROSCOPY/HOLMIUM LASER/STENT PLACEMENT Left 07/08/2023   Procedure: CYSTOSCOPY/ LEFT URETEROSCOPY/HOLMIUM LASER/STENT PLACEMENT, LEFT RETROGRADE PYELOGRAM;  Surgeon: Ferrara Gretel, MD;  [facility];  Service: Urology;  Laterality: Left;  60 MINUTE CASE   DILATION AND CURETTAGE OF UTERUS     ELBOW ARTHROSCOPY Right    EXTRACORPOREAL SHOCK WAVE LITHOTRIPSY Left 03/08/2017   Procedure: LEFT EXTRACORPOREAL SHOCK WAVE LITHOTRIPSY (ESWL);  Surgeon: Ferrara Gretel, MD;  [facility];  Service: Urology;  Laterality:  Left;   EYE SURGERY     HOLMIUM LASER APPLICATION Left 12/18/2014   Procedure: HOLMIUM LASER APPLICATION left ureter ;  Surgeon: Gretel Ferrara, MD;  [facility];  Service: Urology;  Laterality: Left;   JOINT REPLACEMENT  2005 &2006   LUMBAR LAMINECTOMY/DECOMPRESSION MICRODISCECTOMY N/A 11/06/2022   Procedure: OPEN LUMBAR LAMINECTOMY, Lumbar two-three;  Surgeon: Dawley, Lani BROCKS, DO;  Location: [facility];  Service: Neurosurgery;  Laterality: N/A;   PARATHYROIDECTOMY Right 05/03/2015   Procedure: RIGHT SUPERIOR PARATHYROIDECTOMY;  Surgeon: Krystal Spinner, MD;  Location: [facility];  Service: General;  Laterality: Right;   PATELLA REALIGNMENT Bilateral    as a teenager   ROTATOR CUFF REPAIR Right    x2   SHOULDER ARTHROSCOPY Right    x 2   SHOULDER ARTHROSCOPY W/ ROTATOR CUFF REPAIR Left 04/26/2002   SYNOVECTOMY WITH POLY EXCHANGE Left 11/28/2018   Procedure: SYNOVECTOMY WITH ERLINE REASONER;  Surgeon: Rubie Kemps, MD;  [facility];  Service: Orthopedics;  Laterality: Left;   TONSILLECTOMY     TOTAL KNEE ARTHROPLASTY Left 09/10/2003   TOTAL KNEE ARTHROPLASTY Right 11/10/2004   TOTAL KNEE ARTHROPLASTY Right 05/02/2018   Procedure: Right polyethelene exchange and open synovectomy;  Surgeon: Rubie Kemps, MD;  [facility];  Service: Orthopedics;  Laterality: Right;   TRIGGER  FINGER RELEASE Right 08/06/2008   ring finger   TRIGGER FINGER RELEASE     multiple - right thumb, left index/long/ring/thumb   ULNA OSTEOTOMY Right 07/28/2017   Procedure: REVISION RIGHT ULNAR SHORTENING OSTEOTOMY;  Surgeon: Jerri Kay HERO, MD;  Location: [facility];  Service: Orthopedics;  Laterality: Right;   WRIST ARTHROSCOPY Left 06/16/2000   WRIST ARTHROSCOPY WITH ULNA SHORTENING Right 07/07/2017   Procedure: RIGHT WRIST ARTHROSCOPY WITH ULNAR SHORTENING OSTEOTOMY;  Surgeon: Jerri Kay HERO, MD;  Location: [facility];  Service: Orthopedics;  Laterality: Right;   Social History   Occupational History   Occupation: Retired in 2011    Employer: [employer]  Tobacco Use   Smoking status: Never   Smokeless tobacco: Never  Vaping Use   Vaping status: Never Used  Substance and Sexual Activity   Alcohol use: Never   Drug use: Never   Sexual activity: Never    Birth control/protection: Surgical

## 2024-04-13 ENCOUNTER — Telehealth: Admitting: Family Medicine

## 2024-04-13 ENCOUNTER — Ambulatory Visit: Admitting: Sports Medicine

## 2024-04-13 ENCOUNTER — Encounter: Payer: Self-pay | Admitting: Sports Medicine

## 2024-04-13 ENCOUNTER — Ambulatory Visit: Payer: Self-pay

## 2024-04-13 ENCOUNTER — Encounter: Payer: Self-pay | Admitting: Family Medicine

## 2024-04-13 DIAGNOSIS — E782 Mixed hyperlipidemia: Secondary | ICD-10-CM

## 2024-04-13 DIAGNOSIS — E1169 Type 2 diabetes mellitus with other specified complication: Secondary | ICD-10-CM | POA: Diagnosis not present

## 2024-04-13 DIAGNOSIS — M25511 Pain in right shoulder: Secondary | ICD-10-CM

## 2024-04-13 DIAGNOSIS — E119 Type 2 diabetes mellitus without complications: Secondary | ICD-10-CM

## 2024-04-13 DIAGNOSIS — Z7984 Long term (current) use of oral hypoglycemic drugs: Secondary | ICD-10-CM

## 2024-04-13 DIAGNOSIS — G8929 Other chronic pain: Secondary | ICD-10-CM

## 2024-04-13 MED ORDER — ROSUVASTATIN CALCIUM 5 MG PO TABS: ORAL_TABLET | ORAL | 3 refills | Status: AC

## 2024-04-13 MED ORDER — LIDOCAINE HCL 1 % IJ SOLN
0.5000 mL | INTRAMUSCULAR | Status: AC | PRN
  Administered 2024-04-13: .5 mL

## 2024-04-13 MED ORDER — METHYLPREDNISOLONE ACETATE 40 MG/ML IJ SUSP
40.0000 mg | INTRAMUSCULAR | Status: AC | PRN
  Administered 2024-04-13: 40 mg via INTRA_ARTICULAR

## 2024-04-13 NOTE — Progress Notes (Signed)
   Procedure Note  Patient: Katherine [name]             Date of Birth: 10/13/1950           MRN: [id number]             Visit Date: 04/13/2024  Procedures: Visit Diagnoses:  1. Pain in right acromioclavicular joint   2. Chronic right shoulder pain    Medium Joint Inj: R acromioclavicular on 04/13/2024 8:23 AM Indications: pain Details: 25 G 1.5 in needle, ultrasound-guided anterior approach Medications: 0.5 mL lidocaine  1 %; 40 mg methylPREDNISolone  acetate 40 MG/ML  US -guided AC Joint injection, Right shoulder After discussion on risks/benefits/indications, informed verbal consent was obtained. A timeout was then performed. The patient was seated in examination room. The area overlying the [facility] joint of the shoulder was prepped with Betadine  and alcohol swab then utilizing ultrasound guidance, patient's AC joint was injected using a 25G, 1.5 needle with 0.5:1.25mL lidocaine :depomedrol of injectate via an out-of-plane, walk-down approach. Visualization of injectate flow was noted under ultrasound guidance. Patient tolerated the procedure well without immediate complications.   Procedure, treatment alternatives, risks and benefits explained, specific risks discussed. Consent was given by the patient. Immediately prior to procedure a time out was called to verify the correct patient, procedure, equipment, support staff and site/side marked as required. Patient was prepped and draped in the usual sterile fashion.     - patient tolerated procedure well, discussed post-injection protocol - follow-up with Dr. Jerri as indicated; I am happy to see them as needed - Happy Birthday!  Lonell Sprang, DO Primary Care Sports Medicine Physician  [facility] - Orthopedics  This note was dictated using Dragon naturally speaking software and may contain errors in syntax, spelling, or content which have not been identified prior to signing this note.

## 2024-04-13 NOTE — Progress Notes (Signed)
 Virtual Visit via Video Note  I connected with Katherine [name] on 04/13/24 at  4:30 PM EST by a video enabled telemedicine application and verified that I am speaking with the correct person using two identifiers.  Location patient: home Location provider:work or home office Persons participating in the virtual visit: patient, provider  I discussed the limitations of evaluation and management by telemedicine and the availability of in person appointments. The patient expressed understanding and agreed to proceed.  HPI:  Pt is a 73 yo female who has a bday today and was seen for f/u on labs.  Hgb A1C 6.8% on 02/28/24.  Triglycerides 294, LDL 107 in Oct.  Triglycerides previously 160, Total cholesterol 238 on 02/16/22.  ROS: See pertinent positives and negatives per HPI.  Past Medical History:  Diagnosis Date   Allergy    Cataract    LEFT,REMOVED   Complication of anesthesia     neuropathy in feet flares after surgery from PAS hose. needs cream on feet   DDD (degenerative disc disease), lumbar 2012   Dental crowns present    Diabetes mellitus without complication (HCC)    GERD (gastroesophageal reflux disease)    History of anemia 1991   not since hysterectomy   History of gross hematuria 2002   with kidney stones   History of hyperparathyroidism    History of kidney stones    Hyperlipidemia    Hypertension    states under control with meds., has been on med. x 20 yr.   Impingement syndrome of right shoulder 2017   Lymphedema of both lower extremities    due to neuropathy   Lymphedema of both upper extremities    due to neuropathy   Obese    Osteoarthritis    Peripheral neuropathy    hands and feet   Sciatica    Triangular fibrocartilage complex tear 06/2017   right   Ulnar impaction syndrome, right 06/2017   Wears partial dentures    upper and lower    Past Surgical History:  Procedure Laterality Date   ABDOMINAL HYSTERECTOMY  1991   partial   CARPAL TUNNEL  RELEASE Right 08/09/2002   CARPAL TUNNEL RELEASE Left 11/03/2006   CATARACT EXTRACTION Left 07/21/2021   COLONOSCOPY  01/2016   COLONOSCOPY WITH PROPOFOL   11/24/2011   CYSTOSCOPY WITH RETROGRADE PYELOGRAM, URETEROSCOPY AND STENT PLACEMENT Left 12/18/2014   Procedure: CYSTOSCOPY WITH RETROGRADE PYELOGRAM, URETEROSCOPY AND STENT PLACEMENT left ureter;  Surgeon: Gretel Ferrara, MD;  Location: [facility];  Service: Urology;  Laterality: Left;   CYSTOSCOPY/URETEROSCOPY/HOLMIUM LASER/STENT PLACEMENT Left 07/08/2023   Procedure: CYSTOSCOPY/ LEFT URETEROSCOPY/HOLMIUM LASER/STENT PLACEMENT, LEFT RETROGRADE PYELOGRAM;  Surgeon: Ferrara Gretel, MD;  Location: [facility];  Service: Urology;  Laterality: Left;  60 MINUTE CASE   DILATION AND CURETTAGE OF UTERUS     ELBOW ARTHROSCOPY Right    EXTRACORPOREAL SHOCK WAVE LITHOTRIPSY Left 03/08/2017   Procedure: LEFT EXTRACORPOREAL SHOCK WAVE LITHOTRIPSY (ESWL);  Surgeon: Ferrara Gretel, MD;  Location: [facility];  Service: Urology;  Laterality: Left;   EYE SURGERY     HOLMIUM LASER APPLICATION Left 12/18/2014   Procedure: HOLMIUM LASER APPLICATION left ureter ;  Surgeon: Gretel Ferrara, MD;  Location: [facility];  Service: Urology;  Laterality: Left;   JOINT REPLACEMENT  2005 &2006   LUMBAR LAMINECTOMY/DECOMPRESSION MICRODISCECTOMY N/A 11/06/2022   Procedure: OPEN LUMBAR LAMINECTOMY, Lumbar two-three;  Surgeon: Dawley, Lani BROCKS, DO;  Location: [facility];  Service: Neurosurgery;  Laterality: N/A;   PARATHYROIDECTOMY Right 05/03/2015  Procedure: RIGHT SUPERIOR PARATHYROIDECTOMY;  Surgeon: Krystal Spinner, MD;  Location: [facility];  Service: General;  Laterality: Right;   PATELLA REALIGNMENT Bilateral    as a teenager   ROTATOR CUFF REPAIR Right    x2   SHOULDER ARTHROSCOPY Right    x 2   SHOULDER ARTHROSCOPY W/ ROTATOR CUFF REPAIR Left 04/26/2002   SYNOVECTOMY WITH POLY EXCHANGE Left 11/28/2018   Procedure: SYNOVECTOMY WITH ERLINE REASONER;  Surgeon: Rubie Kemps, MD;  Location: [facility];   Service: Orthopedics;  Laterality: Left;   TONSILLECTOMY     TOTAL KNEE ARTHROPLASTY Left 09/10/2003   TOTAL KNEE ARTHROPLASTY Right 11/10/2004   TOTAL KNEE ARTHROPLASTY Right 05/02/2018   Procedure: Right polyethelene exchange and open synovectomy;  Surgeon: Rubie Kemps, MD;  Location: [facility];  Service: Orthopedics;  Laterality: Right;   TRIGGER FINGER RELEASE Right 08/06/2008   ring finger   TRIGGER FINGER RELEASE     multiple - right thumb, left index/long/ring/thumb   ULNA OSTEOTOMY Right 07/28/2017   Procedure: REVISION RIGHT ULNAR SHORTENING OSTEOTOMY;  Surgeon: Jerri Kay HERO, MD;  Location: [facility];  Service: Orthopedics;  Laterality: Right;   WRIST ARTHROSCOPY Left 06/16/2000   WRIST ARTHROSCOPY WITH ULNA SHORTENING Right 07/07/2017   Procedure: RIGHT WRIST ARTHROSCOPY WITH ULNAR SHORTENING OSTEOTOMY;  Surgeon: Jerri Kay HERO, MD;  Location: [facility];  Service: Orthopedics;  Laterality: Right;    Family History  Problem Relation Age of Onset   Stroke Mother    Cancer Mother 32       bone cancer   Other Father        unknown medical history   Diabetes Sister    COPD Sister    Colon cancer Brother    Liver cancer Brother    Colon polyps Child    Stroke Brother    Crohn's disease Neg Hx    Esophageal cancer Neg Hx    Rectal cancer Neg Hx    Stomach cancer Neg Hx    Ulcerative colitis Neg Hx      Current Outpatient Medications:    Cholecalciferol (VITAMIN D ) 50 MCG (2000 UT) tablet, Take 2,000 Units by mouth in the morning., Disp: , Rfl:    cycloSPORINE  (RESTASIS ) 0.05 % ophthalmic emulsion, Place 1 drop into both eyes 2 (two) times daily., Disp: , Rfl:    fexofenadine  (ALLEGRA  ALLERGY) 60 MG tablet, Take 1 tablet (60 mg total) by mouth daily. (Patient taking differently: Take 60 mg by mouth daily as needed for allergies.), Disp: 30 tablet, Rfl: 1   fluticasone  (FLONASE ) 50 MCG/ACT nasal spray, Place 1 spray into both nostrils daily.  (Patient taking differently: Place 1 spray into both nostrils daily as needed for allergies.), Disp: 15.8 mL, Rfl: 2   hydrochlorothiazide  (HYDRODIURIL ) 12.5 MG tablet, Take 12.5 mg by mouth daily., Disp: , Rfl:    lidocaine  (XYLOCAINE ) 5 % ointment, Apply topically 3 (three) times daily as needed., Disp: 425.28 g, Rfl: 4   lidocaine -prilocaine  (EMLA ) cream, Apply 1 application topically to affected areas 3 times daily as needed., Disp: 360 g, Rfl: 11   lisinopril  (ZESTRIL ) 10 MG tablet, TAKE 1 TABLET BY MOUTH DAILY, Disp: 90 tablet, Rfl: 3   Multiple Vitamin (MULTIVITAMIN WITH MINERALS) TABS tablet, Take 1 tablet by mouth in the morning., Disp: , Rfl:    nortriptyline  (PAMELOR ) 25 MG capsule, TAKE 3 CAPSULES BY MOUTH AT  BEDTIME, Disp: 270 capsule, Rfl: 3   ondansetron  (ZOFRAN ) 4 MG tablet,  Take 4 mg by mouth every 8 (eight) hours as needed for nausea or vomiting., Disp: , Rfl:    oxyCODONE -acetaminophen  (PERCOCET/ROXICET) 5-325 MG tablet, Take 1 tablet by mouth every 6 (six) hours as needed for severe pain (pain score 7-10)., Disp: , Rfl:    pregabalin  (LYRICA ) 150 MG capsule, Take 1 capsule (150 mg total) by mouth 2 (two) times daily., Disp: 180 capsule, Rfl: 3   pregabalin  (LYRICA ) 25 MG capsule, Take 1 capsule (25 mg total) by mouth at bedtime. To be taken with 150 mg cap for a total dose of 175 mg daily., Disp: 90 capsule, Rfl: 3   traMADol  (ULTRAM ) 50 MG tablet, Take 50 mg by mouth every 6 (six) hours as needed for severe pain (pain score 7-10)., Disp: , Rfl:   EXAM:  VITALS per patient if applicable:  RR between 12-20 bpm.  GENERAL: alert, oriented, appears well and in no acute distress  HEENT: atraumatic, conjunctiva clear, no obvious abnormalities on inspection of external nose and ears  NECK: normal movements of the head and neck  LUNGS: on inspection no signs of respiratory distress, breathing rate appears normal, no obvious gross SOB, gasping or wheezing  CV: no obvious  cyanosis  MS: moves all visible extremities without noticeable abnormality  PSYCH/NEURO: pleasant and cooperative, no obvious depression or anxiety, speech and thought processing grossly intact  ASSESSMENT AND PLAN:  Discussed the following assessment and plan:  Type 2 diabetes mellitus with other specified complication, without long-term current use of insulin  (HCC) - Plan: empagliflozin  (JARDIANCE ) 10 MG TABS tablet  Mixed hyperlipidemia - Plan: rosuvastatin  (CRESTOR ) 5 MG tablet  Last Hgb A1C was 6.8% on 02/28/24.  Start rousuvastatin 5 mg 2 times per wk.  If tolerated increase frequency.  Will then start farxiga or Jardiance  based on insurance preference for DM.  Jardiance  10 mg sent to pharmacy. Lifestyle modifications including diet changes.  Continue regular exercise.  F/u in 6-8 wks.   I discussed the assessment and treatment plan with the patient. The patient was provided an opportunity to ask questions and all were answered. The patient agreed with the plan and demonstrated an understanding of the instructions.   The patient was advised to call back or seek an in-person evaluation if the symptoms worsen or if the condition fails to improve as anticipated.   Clotilda JONELLE Single, MD

## 2024-05-14 MED ORDER — EMPAGLIFLOZIN 10 MG PO TABS: 10.0000 mg | ORAL_TABLET | Freq: Every day | ORAL | 3 refills | Status: AC

## 2024-05-15 ENCOUNTER — Other Ambulatory Visit: Payer: Self-pay | Admitting: Urology

## 2024-05-25 NOTE — Patient Instructions (Signed)
 SURGICAL WAITING ROOM VISITATION  Patients having surgery or a procedure may have no more than 2 support people in the waiting area - these visitors may rotate.    Children ages 61 and under will not be able to visit patients in [facility] under most circumstances.   Visitors with respiratory illnesses are discouraged from visiting and should remain at home.  If the patient needs to stay at the hospital during part of their recovery, the visitor guidelines for inpatient rooms apply. Pre-op nurse will coordinate an appropriate time for 1 support person to accompany patient in pre-op.  This support person may not rotate.    Please refer to the [facility] website for the visitor guidelines for Inpatients (after your surgery is over and you are in a regular room).       Your procedure is scheduled on: 06/05/24   Report to [facility] Main Entrance    Report to admitting at 7:45 AM   Call this number if you have problems the morning of surgery [phone]   Do not eat food or drink liquids :After Midnight.but may have sips of water  to take meds.   Oral Hygiene is also important to reduce your risk of infection.                                    Remember - BRUSH YOUR TEETH THE MORNING OF SURGERY WITH YOUR REGULAR TOOTHPASTE  DENTURES WILL BE REMOVED PRIOR TO SURGERY PLEASE DO NOT APPLY Poly grip OR ADHESIVES!!!     Stop all vitamins and herbal supplements 7 days before surgery.   Take these medicines the morning of surgery with A SIP OF WATER : fexofenadine , nasal spray, ondansetron , oxycodone , pregabalin (lyrica ), rosuvastatin , tramadol , eye drops.               Do not take hydrochlorothiazide  or lisinopril  the morning of surgery.  DO NOT TAKE ANY ORAL DIABETIC MEDICATIONS DAY OF YOUR SURGERY Hold Jardiance  for 72 hours prior to surgery. Last dose to be 06/01/24.             You may not have any metal on your body including hair pins, jewelry, and body  piercing             Do not wear make-up, lotions, powders, perfumes/cologne, or deodorant  Do not wear nail polish including gel and S&S, artificial/acrylic nails, or any other type of covering on natural nails including finger and toenails. If you have artificial nails, gel coating, etc. that needs to be removed by a nail salon please have this removed prior to surgery or surgery may need to be canceled/ delayed if the surgeon/ anesthesia feels like they are unable to be safely monitored.   Do not shave  48 hours prior to surgery.            Do not bring valuables to the hospital. [facility] IS NOT             RESPONSIBLE   FOR VALUABLES.   Contacts, glasses, dentures or bridgework may not be worn into surgery.   Bring small overnight bag day of surgery.   DO NOT BRING YOUR HOME MEDICATIONS TO THE HOSPITAL. PHARMACY WILL DISPENSE MEDICATIONS LISTED ON YOUR MEDICATION LIST TO YOU DURING YOUR ADMISSION IN THE HOSPITAL!    Patients discharged on the day of surgery will not be allowed to drive  home.  Someone NEEDS to stay with you for the first 24 hours after anesthesia.   Special Instructions: Bring a copy of your healthcare power of attorney and living will documents the day of surgery if you haven't scanned them before.              Please read over the following fact sheets you were given: IF YOU HAVE QUESTIONS ABOUT YOUR PRE-OP INSTRUCTIONS PLEASE [phone] [place]   If you received a COVID test during your pre-op visit  it is requested that you wear a mask when out in public, stay away from anyone that may not be feeling well and notify your surgeon if you develop symptoms. If you test positive for Covid or have been in contact with anyone that has tested positive in the last 10 days please notify you surgeon.    [facility] - Preparing for Surgery Before surgery, you can play an important role.  Because skin is not sterile, your skin needs to be as free of germs as possible.   You can reduce the number of germs on your skin by washing with CHG (chlorahexidine gluconate) soap before surgery.  CHG is an antiseptic cleaner which kills germs and bonds with the skin to continue killing germs even after washing. Please DO NOT use if you have an allergy to CHG or antibacterial soaps.  If your skin becomes reddened/irritated stop using the CHG and inform your nurse when you arrive at Short Stay. Do not shave (including legs and underarms) for at least 48 hours prior to the first CHG shower.  You may shave your face/neck.  Please follow these instructions carefully:  1.  Shower with CHG Soap the night before surgery and the morning of surgery.  2.  If you choose to wash your hair, wash your hair first as usual with your normal  shampoo.  3.  After you shampoo, rinse your hair and body thoroughly to remove the shampoo.                             4.  Use CHG as you would any other liquid soap.  You can apply chg directly to the skin and wash.  Gently with a scrungie or clean washcloth.  5.  Apply the CHG Soap to your body ONLY FROM THE NECK DOWN.   Do   not use on face/ open                           Wound or open sores. Avoid contact with eyes, ears mouth and   genitals (private parts).                       Wash face,  Genitals (private parts) with your normal soap.             6.  Wash thoroughly, paying special attention to the area where your    surgery  will be performed.  7.  Thoroughly rinse your body with warm water  from the neck down.  8.  DO NOT shower/wash with your normal soap after using and rinsing off the CHG Soap.                9.  Pat yourself dry with a clean towel.            10.  Wear clean pajamas.  11.  Place clean sheets on your bed the night of your first shower and do not  sleep with pets. Day of Surgery : Do not apply any CHG, lotions/deodorants the morning of surgery.  Please wear clean clothes to the hospital/surgery center.  FAILURE TO  FOLLOW THESE INSTRUCTIONS MAY RESULT IN THE CANCELLATION OF YOUR SURGERY  PATIENT SIGNATURE_________________________________  NURSE SIGNATURE__________________________________  ________________________________________________________________________How to Manage Your Diabetes Before and After Surgery  Why is it important to control my blood sugar before and after surgery? Improving blood sugar levels before and after surgery helps healing and can limit problems. A way of improving blood sugar control is eating a healthy diet by:  Eating less sugar and carbohydrates  Increasing activity/exercise  Talking with your doctor about reaching your blood sugar goals High blood sugars (greater than 180 mg/dL) can raise your risk of infections and slow your recovery, so you will need to focus on controlling your diabetes during the weeks before surgery. Make sure that the doctor who takes care of your diabetes knows about your planned surgery including the date and location.  How do I manage my blood sugar before surgery? Check your blood sugar at least 4 times a day, starting 2 days before surgery, to make sure that the level is not too high or low. Check your blood sugar the morning of your surgery when you wake up and every 2 hours until you get to the Short Stay unit. If your blood sugar is less than 70 mg/dL, you will need to treat for low blood sugar: Do not take insulin . Treat a low blood sugar (less than 70 mg/dL) with  cup of clear juice (cranberry or apple), 4 glucose tablets, OR glucose gel. Recheck blood sugar in 15 minutes after treatment (to make sure it is greater than 70 mg/dL). If your blood sugar is not greater than 70 mg/dL on recheck, call [phone] for further instructions. Report your blood sugar to the short stay nurse when you get to Short Stay.  If you are admitted to the hospital after surgery: Your blood sugar will be checked by the staff and you will probably be given  insulin  after surgery (instead of oral diabetes medicines) to make sure you have good blood sugar levels. The goal for blood sugar control after surgery is 80-180 mg/dL.   WHAT DO I DO ABOUT MY DIABETES MEDICATION?  Do not take oral diabetes medicines (pills) the morning of surgery.  DO NOT TAKE THE FOLLOWING 7 DAYS PRIOR TO SURGERY: Ozempic, Wegovy, Rybelsus (Semaglutide), Byetta (exenatide), Bydureon (exenatide ER), Victoza, Saxenda (liraglutide), or Trulicity (dulaglutide) Mounjaro (Tirzepatide) Adlyxin (Lixisenatide), Polyethylene Glycol Loxenatide.   Patient Signature:  Date:   Nurse Signature:  Date:   Reviewed and Endorsed by [facility] Patient Education Committee, August 2015

## 2024-05-25 NOTE — Progress Notes (Signed)
 COVID Vaccine received:  []  No []  Yes Date of any COVID positive Test in last 90 days:  PCP - Clotilda Single MD Cardiologist -   Chest x-ray -  EKG -   Stress Test -  ECHO -  Cardiac Cath -   Bowel Prep - []  No  []   Yes ______  Pacemaker / ICD device []  No []  Yes   Spinal Cord Stimulator:[]  No []  Yes       History of Sleep Apnea? []  No []  Yes   CPAP used?- []  No []  Yes    Does the patient monitor blood sugar?          []  No []  Yes  []  N/A  Patient has: []  NO Hx DM   []  Pre-DM                 []  DM1  []   DM2 Does patient have a [employer] or Dexacom? []  No []  Yes   Fasting Blood Sugar Ranges-  Checks Blood Sugar _____ times a day  GLP1 agonist / usual dose -  GLP1 instructions:  SGLT-2 inhibitors / usual dose -  SGLT-2 instructions:   Blood Thinner / Instructions: Aspirin  Instructions:  Comments:   Activity level: Patient is able / unable to climb a flight of stairs without difficulty; []  No CP  []  No SOB, but would have ___   Patient can / can not perform ADLs without assistance.   Anesthesia review:   Patient denies shortness of breath, fever, cough and chest pain at PAT appointment.  Patient verbalized understanding and agreement to the Pre-Surgical Instructions that were given to them at this PAT appointment. Patient was also educated of the need to review these PAT instructions again prior to his/her surgery.I reviewed the appropriate phone numbers to call if they have any and questions or concerns.

## 2024-05-26 ENCOUNTER — Other Ambulatory Visit: Payer: Self-pay

## 2024-05-26 ENCOUNTER — Encounter (HOSPITAL_COMMUNITY)
Admission: RE | Admit: 2024-05-26 | Discharge: 2024-05-26 | Disposition: A | Discharge status: Home / Self Care | Source: Ambulatory Visit | Attending: Urology | Admitting: Urology

## 2024-05-26 ENCOUNTER — Encounter (HOSPITAL_COMMUNITY): Payer: Self-pay

## 2024-05-26 VITALS — BP 100/76 | HR 110 | Temp 97.5°F | Resp 20 | Ht 64.5 in | Wt 243.0 lb

## 2024-05-26 DIAGNOSIS — I10 Essential (primary) hypertension: Secondary | ICD-10-CM | POA: Insufficient documentation

## 2024-05-26 DIAGNOSIS — E119 Type 2 diabetes mellitus without complications: Secondary | ICD-10-CM | POA: Diagnosis not present

## 2024-05-26 DIAGNOSIS — Z01818 Encounter for other preprocedural examination: Secondary | ICD-10-CM | POA: Diagnosis present

## 2024-05-26 LAB — BASIC METABOLIC PANEL WITH GFR
Anion gap: 12 (ref 5–15)
BUN: 19 mg/dL (ref 8–23)
CO2: 25 mmol/L (ref 22–32)
Calcium: 10.2 mg/dL (ref 8.9–10.3)
Chloride: 100 mmol/L (ref 98–111)
Creatinine, Ser: 1.05 mg/dL — ABNORMAL HIGH (ref 0.44–1.00)
GFR, Estimated: 55 mL/min — ABNORMAL LOW
Glucose, Bld: 121 mg/dL — ABNORMAL HIGH (ref 70–99)
Potassium: 3.7 mmol/L (ref 3.5–5.1)
Sodium: 136 mmol/L (ref 135–145)

## 2024-05-26 LAB — CBC
HCT: 41.8 % (ref 36.0–46.0)
Hemoglobin: 13.8 g/dL (ref 12.0–15.0)
MCH: 29.6 pg (ref 26.0–34.0)
MCHC: 33 g/dL (ref 30.0–36.0)
MCV: 89.5 fL (ref 80.0–100.0)
Platelets: 167 K/uL (ref 150–400)
RBC: 4.67 MIL/uL (ref 3.87–5.11)
RDW: 14.2 % (ref 11.5–15.5)
WBC: 4.1 K/uL (ref 4.0–10.5)
nRBC: 0 % (ref 0.0–0.2)

## 2024-05-26 LAB — HEMOGLOBIN A1C
Hgb A1c MFr Bld: 6.5 % — ABNORMAL HIGH (ref 4.8–5.6)
Mean Plasma Glucose: 139.85 mg/dL

## 2024-05-26 LAB — GLUCOSE, CAPILLARY: Glucose-Capillary: 119 mg/dL — ABNORMAL HIGH (ref 70–99)

## 2024-06-05 ENCOUNTER — Encounter (HOSPITAL_COMMUNITY): Admission: RE | Payer: Self-pay | Source: Ambulatory Visit

## 2024-06-05 ENCOUNTER — Encounter (HOSPITAL_COMMUNITY): Payer: Self-pay | Admitting: Certified Registered"

## 2024-06-05 ENCOUNTER — Ambulatory Visit (HOSPITAL_COMMUNITY): Admission: RE | Admit: 2024-06-05 | Admitting: Urology

## 2024-06-08 ENCOUNTER — Other Ambulatory Visit: Payer: Self-pay | Admitting: Urology

## 2024-06-13 ENCOUNTER — Encounter (HOSPITAL_COMMUNITY): Payer: Self-pay

## 2024-06-13 NOTE — Addendum Note (Signed)
 Encounter addended by: Odis Burnard Jansky, PA-C on: 06/13/2024 12:27 PM  Actions taken: SmartForm saved

## 2024-06-13 NOTE — Anesthesia Preprocedure Evaluation (Signed)
"                                    Anesthesia Evaluation    Airway        Dental   Pulmonary           Cardiovascular hypertension,      Neuro/Psych    GI/Hepatic   Endo/Other  diabetes    Renal/GU      Musculoskeletal   Abdominal   Peds  Hematology   Anesthesia Other Findings   Reproductive/Obstetrics                              Anesthesia Physical Anesthesia Plan  ASA:   Anesthesia Plan:    Post-op Pain Management:    Induction:   PONV Risk Score and Plan:   Airway Management Planned:   Additional Equipment:   Intra-op Plan:   Post-operative Plan:   Informed Consent:   Plan Discussed with:   Anesthesia Plan Comments: Katherine [name] is a 74 yo female with PMH of HTN, lymphedema, DM with peripheral neuropathy (A1c 6.5), GERD, arthritis, lumbar spinal stenosis, obesity (BMI 41), kidney stones. Labs with mild AKI. EKG on 1/16 with sinus tachycardia which appears chronic )         Anesthesia Quick Evaluation  "

## 2024-06-19 ENCOUNTER — Ambulatory Visit (HOSPITAL_COMMUNITY): Admission: RE | Admit: 2024-06-19 | Admitting: Urology

## 2024-06-19 ENCOUNTER — Encounter (HOSPITAL_COMMUNITY): Admission: RE | Payer: Self-pay | Source: Home / Self Care

## 2024-06-19 ENCOUNTER — Encounter (HOSPITAL_COMMUNITY): Payer: Self-pay | Admitting: Medical

## 2025-02-07 ENCOUNTER — Ambulatory Visit
